# Patient Record
Sex: Male | Born: 1970
Health system: Southern US, Community
[De-identification: ages and names within clinical notes are randomized; demographics above are authoritative.]

## PROBLEM LIST (undated history)

## (undated) DIAGNOSIS — N289 Disorder of kidney and ureter, unspecified: Secondary | ICD-10-CM

## (undated) DIAGNOSIS — I509 Heart failure, unspecified: Secondary | ICD-10-CM

## (undated) DIAGNOSIS — I10 Essential (primary) hypertension: Secondary | ICD-10-CM

## (undated) DIAGNOSIS — E119 Type 2 diabetes mellitus without complications: Secondary | ICD-10-CM

## (undated) DIAGNOSIS — R569 Unspecified convulsions: Secondary | ICD-10-CM

## (undated) HISTORY — PX: OTHER SURGICAL HISTORY: SHX169

---

## 2006-09-03 ENCOUNTER — Emergency Department (HOSPITAL_COMMUNITY): Admission: EM | Admit: 2006-09-03 | Discharge: 2006-09-03 | Payer: Self-pay | Admitting: Emergency Medicine

## 2015-05-14 ENCOUNTER — Encounter (HOSPITAL_COMMUNITY): Payer: Self-pay | Admitting: Emergency Medicine

## 2015-05-14 ENCOUNTER — Emergency Department (HOSPITAL_COMMUNITY)
Admission: EM | Admit: 2015-05-14 | Discharge: 2015-05-14 | Disposition: A | Discharge status: Home / Self Care | Payer: No Typology Code available for payment source | Attending: Emergency Medicine | Admitting: Emergency Medicine

## 2015-05-14 DIAGNOSIS — E119 Type 2 diabetes mellitus without complications: Secondary | ICD-10-CM | POA: Diagnosis not present

## 2015-05-14 DIAGNOSIS — I10 Essential (primary) hypertension: Secondary | ICD-10-CM | POA: Diagnosis not present

## 2015-05-14 DIAGNOSIS — Y9389 Activity, other specified: Secondary | ICD-10-CM | POA: Insufficient documentation

## 2015-05-14 DIAGNOSIS — S8992XA Unspecified injury of left lower leg, initial encounter: Secondary | ICD-10-CM | POA: Insufficient documentation

## 2015-05-14 DIAGNOSIS — Y998 Other external cause status: Secondary | ICD-10-CM | POA: Insufficient documentation

## 2015-05-14 DIAGNOSIS — S199XXA Unspecified injury of neck, initial encounter: Secondary | ICD-10-CM | POA: Insufficient documentation

## 2015-05-14 DIAGNOSIS — S4992XA Unspecified injury of left shoulder and upper arm, initial encounter: Secondary | ICD-10-CM | POA: Insufficient documentation

## 2015-05-14 DIAGNOSIS — F17210 Nicotine dependence, cigarettes, uncomplicated: Secondary | ICD-10-CM | POA: Diagnosis not present

## 2015-05-14 DIAGNOSIS — Y92410 Unspecified street and highway as the place of occurrence of the external cause: Secondary | ICD-10-CM | POA: Diagnosis not present

## 2015-05-14 DIAGNOSIS — Z87448 Personal history of other diseases of urinary system: Secondary | ICD-10-CM | POA: Insufficient documentation

## 2015-05-14 DIAGNOSIS — M542 Cervicalgia: Secondary | ICD-10-CM

## 2015-05-14 HISTORY — DX: Disorder of kidney and ureter, unspecified: N28.9

## 2015-05-14 HISTORY — DX: Type 2 diabetes mellitus without complications: E11.9

## 2015-05-14 HISTORY — DX: Essential (primary) hypertension: I10

## 2015-05-14 MED ORDER — IBUPROFEN 800 MG PO TABS: 800.0000 mg | ORAL_TABLET | Freq: Three times a day (TID) | ORAL | Status: DC

## 2015-05-14 MED ORDER — CYCLOBENZAPRINE HCL 10 MG PO TABS: 10.0000 mg | ORAL_TABLET | Freq: Two times a day (BID) | ORAL | Status: DC | PRN

## 2015-05-14 MED ORDER — CYCLOBENZAPRINE HCL 10 MG PO TABS
10.0000 mg | ORAL_TABLET | Freq: Two times a day (BID) | ORAL | Status: DC | PRN
Start: 2015-05-14 — End: 2015-05-14

## 2015-05-14 NOTE — Discharge Instructions (Signed)
Please read attached information. If you experience any new or worsening signs or symptoms please return to the emergency room for evaluation. Please follow-up with your primary care provider or specialist as discussed. Please use medication prescribed only as directed and discontinue taking if you have any concerning signs or symptoms.   °
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
## 2015-06-15 ENCOUNTER — Encounter (HOSPITAL_COMMUNITY): Payer: Self-pay | Admitting: *Deleted

## 2015-06-15 ENCOUNTER — Emergency Department (HOSPITAL_COMMUNITY): Payer: Medicare Other

## 2015-06-15 ENCOUNTER — Inpatient Hospital Stay (HOSPITAL_COMMUNITY)
Admission: EM | Admit: 2015-06-15 | Discharge: 2015-06-27 | DRG: 673 | Disposition: A | Discharge status: Home / Self Care | Payer: Medicare Other | Attending: Internal Medicine | Admitting: Internal Medicine

## 2015-06-15 DIAGNOSIS — E785 Hyperlipidemia, unspecified: Secondary | ICD-10-CM | POA: Diagnosis present

## 2015-06-15 DIAGNOSIS — R29810 Facial weakness: Secondary | ICD-10-CM | POA: Diagnosis not present

## 2015-06-15 DIAGNOSIS — J9601 Acute respiratory failure with hypoxia: Secondary | ICD-10-CM | POA: Diagnosis present

## 2015-06-15 DIAGNOSIS — E118 Type 2 diabetes mellitus with unspecified complications: Secondary | ICD-10-CM | POA: Insufficient documentation

## 2015-06-15 DIAGNOSIS — E8770 Fluid overload, unspecified: Secondary | ICD-10-CM | POA: Diagnosis present

## 2015-06-15 DIAGNOSIS — R630 Anorexia: Secondary | ICD-10-CM | POA: Diagnosis present

## 2015-06-15 DIAGNOSIS — G9340 Encephalopathy, unspecified: Secondary | ICD-10-CM | POA: Diagnosis not present

## 2015-06-15 DIAGNOSIS — Z794 Long term (current) use of insulin: Secondary | ICD-10-CM

## 2015-06-15 DIAGNOSIS — R0789 Other chest pain: Secondary | ICD-10-CM

## 2015-06-15 DIAGNOSIS — E8729 Other acidosis: Secondary | ICD-10-CM

## 2015-06-15 DIAGNOSIS — D638 Anemia in other chronic diseases classified elsewhere: Secondary | ICD-10-CM | POA: Diagnosis present

## 2015-06-15 DIAGNOSIS — I120 Hypertensive chronic kidney disease with stage 5 chronic kidney disease or end stage renal disease: Secondary | ICD-10-CM | POA: Diagnosis not present

## 2015-06-15 DIAGNOSIS — Z9115 Patient's noncompliance with renal dialysis: Secondary | ICD-10-CM

## 2015-06-15 DIAGNOSIS — N2581 Secondary hyperparathyroidism of renal origin: Secondary | ICD-10-CM | POA: Diagnosis present

## 2015-06-15 DIAGNOSIS — I6340 Cerebral infarction due to embolism of unspecified cerebral artery: Secondary | ICD-10-CM | POA: Insufficient documentation

## 2015-06-15 DIAGNOSIS — Z95828 Presence of other vascular implants and grafts: Secondary | ICD-10-CM

## 2015-06-15 DIAGNOSIS — K921 Melena: Secondary | ICD-10-CM | POA: Insufficient documentation

## 2015-06-15 DIAGNOSIS — R079 Chest pain, unspecified: Secondary | ICD-10-CM

## 2015-06-15 DIAGNOSIS — T39315A Adverse effect of propionic acid derivatives, initial encounter: Secondary | ICD-10-CM | POA: Diagnosis present

## 2015-06-15 DIAGNOSIS — K2210 Ulcer of esophagus without bleeding: Secondary | ICD-10-CM | POA: Diagnosis present

## 2015-06-15 DIAGNOSIS — I639 Cerebral infarction, unspecified: Secondary | ICD-10-CM

## 2015-06-15 DIAGNOSIS — N186 End stage renal disease: Secondary | ICD-10-CM

## 2015-06-15 DIAGNOSIS — E872 Acidosis: Secondary | ICD-10-CM

## 2015-06-15 DIAGNOSIS — K2211 Ulcer of esophagus with bleeding: Secondary | ICD-10-CM | POA: Diagnosis present

## 2015-06-15 DIAGNOSIS — R0781 Pleurodynia: Secondary | ICD-10-CM | POA: Diagnosis present

## 2015-06-15 DIAGNOSIS — Z79899 Other long term (current) drug therapy: Secondary | ICD-10-CM

## 2015-06-15 DIAGNOSIS — R410 Disorientation, unspecified: Secondary | ICD-10-CM

## 2015-06-15 DIAGNOSIS — R471 Dysarthria and anarthria: Secondary | ICD-10-CM | POA: Diagnosis present

## 2015-06-15 DIAGNOSIS — Z419 Encounter for procedure for purposes other than remedying health state, unspecified: Secondary | ICD-10-CM

## 2015-06-15 DIAGNOSIS — I248 Other forms of acute ischemic heart disease: Secondary | ICD-10-CM | POA: Diagnosis present

## 2015-06-15 DIAGNOSIS — I63411 Cerebral infarction due to embolism of right middle cerebral artery: Secondary | ICD-10-CM

## 2015-06-15 DIAGNOSIS — I10 Essential (primary) hypertension: Secondary | ICD-10-CM | POA: Diagnosis present

## 2015-06-15 DIAGNOSIS — F1290 Cannabis use, unspecified, uncomplicated: Secondary | ICD-10-CM | POA: Diagnosis present

## 2015-06-15 DIAGNOSIS — D649 Anemia, unspecified: Secondary | ICD-10-CM | POA: Insufficient documentation

## 2015-06-15 DIAGNOSIS — E119 Type 2 diabetes mellitus without complications: Secondary | ICD-10-CM

## 2015-06-15 DIAGNOSIS — R52 Pain, unspecified: Secondary | ICD-10-CM

## 2015-06-15 DIAGNOSIS — N19 Unspecified kidney failure: Secondary | ICD-10-CM | POA: Diagnosis present

## 2015-06-15 DIAGNOSIS — Z8673 Personal history of transient ischemic attack (TIA), and cerebral infarction without residual deficits: Secondary | ICD-10-CM

## 2015-06-15 DIAGNOSIS — M79604 Pain in right leg: Secondary | ICD-10-CM

## 2015-06-15 DIAGNOSIS — K449 Diaphragmatic hernia without obstruction or gangrene: Secondary | ICD-10-CM | POA: Diagnosis present

## 2015-06-15 DIAGNOSIS — E1122 Type 2 diabetes mellitus with diabetic chronic kidney disease: Secondary | ICD-10-CM | POA: Diagnosis present

## 2015-06-15 DIAGNOSIS — F17210 Nicotine dependence, cigarettes, uncomplicated: Secondary | ICD-10-CM | POA: Diagnosis present

## 2015-06-15 DIAGNOSIS — I69354 Hemiplegia and hemiparesis following cerebral infarction affecting left non-dominant side: Secondary | ICD-10-CM | POA: Insufficient documentation

## 2015-06-15 DIAGNOSIS — D62 Acute posthemorrhagic anemia: Secondary | ICD-10-CM | POA: Diagnosis present

## 2015-06-15 DIAGNOSIS — R0902 Hypoxemia: Secondary | ICD-10-CM

## 2015-06-15 DIAGNOSIS — Y841 Kidney dialysis as the cause of abnormal reaction of the patient, or of later complication, without mention of misadventure at the time of the procedure: Secondary | ICD-10-CM | POA: Diagnosis present

## 2015-06-15 DIAGNOSIS — J449 Chronic obstructive pulmonary disease, unspecified: Secondary | ICD-10-CM | POA: Diagnosis present

## 2015-06-15 DIAGNOSIS — E11649 Type 2 diabetes mellitus with hypoglycemia without coma: Secondary | ICD-10-CM | POA: Diagnosis present

## 2015-06-15 DIAGNOSIS — E875 Hyperkalemia: Secondary | ICD-10-CM

## 2015-06-15 DIAGNOSIS — Z7984 Long term (current) use of oral hypoglycemic drugs: Secondary | ICD-10-CM

## 2015-06-15 DIAGNOSIS — G8194 Hemiplegia, unspecified affecting left nondominant side: Secondary | ICD-10-CM | POA: Diagnosis not present

## 2015-06-15 DIAGNOSIS — I4891 Unspecified atrial fibrillation: Secondary | ICD-10-CM | POA: Diagnosis present

## 2015-06-15 DIAGNOSIS — Z452 Encounter for adjustment and management of vascular access device: Secondary | ICD-10-CM

## 2015-06-15 DIAGNOSIS — K920 Hematemesis: Secondary | ICD-10-CM | POA: Diagnosis not present

## 2015-06-15 DIAGNOSIS — Z7982 Long term (current) use of aspirin: Secondary | ICD-10-CM

## 2015-06-15 DIAGNOSIS — Z720 Tobacco use: Secondary | ICD-10-CM | POA: Insufficient documentation

## 2015-06-15 DIAGNOSIS — I82619 Acute embolism and thrombosis of superficial veins of unspecified upper extremity: Secondary | ICD-10-CM | POA: Diagnosis present

## 2015-06-15 DIAGNOSIS — T85611A Breakdown (mechanical) of intraperitoneal dialysis catheter, initial encounter: Secondary | ICD-10-CM | POA: Diagnosis present

## 2015-06-15 DIAGNOSIS — Z992 Dependence on renal dialysis: Secondary | ICD-10-CM

## 2015-06-15 DIAGNOSIS — R0602 Shortness of breath: Secondary | ICD-10-CM

## 2015-06-15 DIAGNOSIS — J9600 Acute respiratory failure, unspecified whether with hypoxia or hypercapnia: Secondary | ICD-10-CM | POA: Diagnosis present

## 2015-06-15 LAB — BASIC METABOLIC PANEL
ANION GAP: 17 — AB (ref 5–15)
BUN: 149 mg/dL — AB (ref 6–20)
CALCIUM: 8.1 mg/dL — AB (ref 8.9–10.3)
CO2: 18 mmol/L — ABNORMAL LOW (ref 22–32)
Chloride: 105 mmol/L (ref 101–111)
Creatinine, Ser: 29.2 mg/dL — ABNORMAL HIGH (ref 0.61–1.24)
GFR calc Af Amer: 2 mL/min — ABNORMAL LOW (ref >60)
GFR, EST NON AFRICAN AMERICAN: 2 mL/min — AB (ref >60)
Glucose, Bld: 109 mg/dL — ABNORMAL HIGH (ref 65–99)
POTASSIUM: 5.5 mmol/L — AB (ref 3.5–5.1)
SODIUM: 140 mmol/L (ref 135–145)

## 2015-06-15 LAB — CBC
HCT: 22.9 % — ABNORMAL LOW (ref 39.0–52.0)
Hemoglobin: 7.8 g/dL — ABNORMAL LOW (ref 13.0–17.0)
MCH: 29.4 pg (ref 26.0–34.0)
MCHC: 34.1 g/dL (ref 30.0–36.0)
MCV: 86.4 fL (ref 78.0–100.0)
Platelets: 177 10*3/uL (ref 150–400)
RBC: 2.65 MIL/uL — ABNORMAL LOW (ref 4.22–5.81)
RDW: 15.6 % — ABNORMAL HIGH (ref 11.5–15.5)
WBC: 7.3 10*3/uL (ref 4.0–10.5)

## 2015-06-15 LAB — TROPONIN I: TROPONIN I: 0.08 ng/mL — AB (ref <0.031)

## 2015-06-15 MED ORDER — ENOXAPARIN SODIUM 100 MG/ML ~~LOC~~ SOLN
90.0000 mg | Freq: Once | SUBCUTANEOUS | Status: AC
  Administered 2015-06-16: 90 mg via SUBCUTANEOUS
  Filled 2015-06-15: qty 1

## 2015-06-15 MED ORDER — HYDROCODONE-ACETAMINOPHEN 5-325 MG PO TABS
2.0000 | ORAL_TABLET | Freq: Once | ORAL | Status: AC
  Administered 2015-06-16: 2 via ORAL
  Filled 2015-06-15: qty 2

## 2015-06-15 NOTE — ED Notes (Signed)
The pt is c/o rt sided chest pain for 3-4 days he has a cold  Cough non-productive.  He is also og bi-lateral feet and leg pain  Hx chf

## 2015-06-15 NOTE — ED Notes (Signed)
thye pt is a peritoneal dialysis pt

## 2015-06-15 NOTE — ED Provider Notes (Signed)
By signing my name below, I, George [name], attest that this documentation has been prepared under the direction and in the presence of [place], DO. Electronically Signed: Irene [name], ED Scribe. 06/15/2015. 11:46 PM.  TIME SEEN: 11:34 PM  CHIEF COMPLAINT: bilateral leg pain worse on the right side, right-sided chest pain, shortness of breath, cough  HPI:  HPI Comments: George [name] is a 44 y.o. male with a hx of HTN, CHF, DM, and in stage renal disease on nightly peritoneal dialysis who presents to the Emergency Department complaining of gradually worsening, sharp, "intense," shooting, right worse than left, bilateral posterior leg pain onset 3 days ago. He states that the pain will radiate up his legs from his feet and will keep him up at night. Pt currently gives himself peritoneal dialysis. He states that he takes it at night, which he has not done yet tonight. States he has been doing his peritoneal dialysis regularly. Pt reports associated nausea, "needle like" intermittent right sided chest pain, and non-productive cough. Pt has been taking 800 mg ibuprofen to no relief. You have shortness of breath but this is gone. He denies worsening or alleviating factors. Pt denies fever, chills, injury to the area, left sided chest pain, chest pressure, vomiting, diarrhea, abdominal pain, hx of neuropathy, or hx of blood clots. Pt states he is new to the area from [place] and does not have a PCP. Recently moved here in October. Does not have a nephrologist.   ROS: See HPI Constitutional: no fever  Eyes: no drainage  ENT: no runny nose   Cardiovascular: chest pain  Resp:  SOB  GI: no vomiting GU: no dysuria Integumentary: no rash  Allergy: no hives  Musculoskeletal: no leg swelling  Neurological: no slurred speech ROS otherwise negative  PAST MEDICAL HISTORY/PAST SURGICAL HISTORY:  Past Medical History  Diagnosis Date  . Diabetes mellitus without complication ([place])   .  Hypertension   . Renal disorder     MEDICATIONS:  Prior to Admission medications   Medication Sig Start Date End Date Taking? Authorizing Provider  amLODipine (NORVASC) 5 MG tablet Take 5 mg by mouth 2 (two) times daily.   Yes Historical Provider, MD  aspirin EC 81 MG tablet Take 81 mg by mouth daily.   Yes Historical Provider, MD  carvedilol (COREG) 25 MG tablet Take 25 mg by mouth 2 (two) times daily with a meal.   Yes Historical Provider, MD  digoxin (LANOXIN) 0.125 MG tablet Take 0.125 mg by mouth every other day.   Yes Historical Provider, MD  furosemide (LASIX) 80 MG tablet Take 160 mg by mouth 2 (two) times daily.   Yes Historical Provider, MD  ibuprofen (ADVIL,MOTRIN) 800 MG tablet Take 1 tablet (800 mg total) by mouth 3 (three) times daily. 05/14/15  Yes Jeffrey Hedges, PA-C  isosorbide-hydrALAZINE (BIDIL) 20-37.5 MG tablet Take 1 tablet by mouth 3 (three) times daily.   Yes Historical Provider, MD  lanthanum (FOSRENOL) 1000 MG chewable tablet Chew 1,000 mg by mouth 3 (three) times daily with meals.   Yes Historical Provider, MD  losartan (COZAAR) 25 MG tablet Take 25 mg by mouth daily.   Yes Historical Provider, MD  multivitamin (RENA-VIT) TABS tablet Take 1 tablet by mouth daily.   Yes Historical Provider, MD  pravastatin (PRAVACHOL) 20 MG tablet Take 20 mg by mouth every evening.   Yes Historical Provider, MD  sitaGLIPtin (JANUVIA) 25 MG tablet Take 25 mg by mouth daily.   Yes Historical  Provider, MD  cyclobenzaprine (FLEXERIL) 10 MG tablet Take 1 tablet (10 mg total) by mouth 2 (two) times daily as needed for muscle spasms. 05/14/15   Okey Regal, PA-C    ALLERGIES:  No Known Allergies  SOCIAL HISTORY:  Social History  Substance Use Topics  . Smoking status: Current Every Day Smoker    Types: Cigarettes  . Smokeless tobacco: Not on file  . Alcohol Use: No    FAMILY HISTORY: No family history on file.  EXAM: BP 168/117 mmHg  Pulse 89  Temp(Src) 97.6 F (36.4 C)  (Oral)  Resp 21  Ht 6' (1.829 m)  Wt 202 lb (91.627 kg)  BMI 27.39 kg/m2  SpO2 95% CONSTITUTIONAL: Alert and oriented and responds appropriately to questions. Well-appearing; well-nourished HEAD: Normocephalic EYES: Conjunctivae clear, PERRL ENT: normal nose; no rhinorrhea; moist mucous membranes; pharynx without lesions noted NECK: Supple, no meningismus, no LAD  CARD: RRR; S1 and S2 appreciated; no murmurs, no clicks, no rubs, no gallops RESP: Normal chest excursion without splinting or tachypnea; breath sounds equal bilaterally with mild scattered expiratory wheezing, no rhonchi or rales, no hypoxia or respiratory distress, speaking full sentences ABD/GI: Normal bowel sounds; non-distended; soft, non-tender, no rebound, no guarding, no peritoneal signs; bandage over the left abdomen for peritoneal dialysis BACK:  The back appears normal and is non-tender to palpation, there is no CVA tenderness EXT: Normal ROM in all joints; non-tender to palpation; no edema; normal capillary refill; no cyanosis, right lower extremity tenderness in the popliteal area and proximal posterior calf with palpation but no swelling, I am unable to reproduce pain with palpation of the left calf and there is no swelling in this light either SKIN: Normal color for age and race; warm NEURO: Moves all extremities equally, sensation to light touch intact diffusely, cranial nerves II through XII intact PSYCH: The patient's mood and manner are appropriate. Grooming and personal hygiene are appropriate.  MEDICAL DECISION MAKING: Patient here with complaints of chest pain and shortness of breath that have now resolved. Chest pain described as sharp and lasted for several seconds. Also complaining of bilateral lower extremity pain worse in the right leg and this is reproducible with palpation. No swelling but concern for possible DVT. Will give therapeutic Lovenox and schedule him for a venous ultrasound in the morning. Labs  ordered in triage show hyperkalemia, potassium 5.5 with no EKG changes. He does have a metabolic acidosis secondary to uremia. BUN is 149 and creatinine is 29. He reports compliance with his peritoneal dialysis. Hemoglobin is 7.8. Given he recently moved here from [place] we have no old labs for comparison. Will obtain records from recent admission in August 2016 from [facility] in [place]. We'll give Vicodin for pain. He does have a slightly positive troponin but this is in the setting of end-stage renal disease. We'll repeat a second troponin to ensure that there is no significant change. He is still chest pain-free.  ED PROGRESS:  1:48 AM- received records from [facility]; pt had a hemoglobin of 8.8 - 9.4 in August 2016   Patient's second troponin is negative. When I went to reassess him he was hypoxic with sats in the mid 80s. He does not wear oxygen at home. He denies having chest pain or shortness of breath. Repeat lung exam reveals similar mild scattered wheezes. Given 2 DuoNeb treatments without any relief of his hypoxia. When he and relates his sats dropped to 85%. He does not appear significantly volume overloaded.  Discussed with patient however given his leg pain and now his hypoxia concern for possible pulmonary embolus. He still makes urine several times a day and has been on peritoneal dialysis for 3 years. I feel he will need further imaging to rule out pulmonary embolus as a potential cause for his hypoxia as well as a venous Doppler. I do not want to give him contrast given he still has some renal function and makes urine but recommend VQ scan. Patient agrees on admission. We'll discuss with hospitalist.  3:23 AM- spoke with Hospitalist Dr. Arnoldo Morale who agrees with stepdown inpatient admission for the pt. Will put in orders for telemetry, inpatient. She requests that we discuss with nephrology to set up peritoneal dialysis.   3:45 AM  Spoke with Dr. Meredeth Ide with nephrology.  They will see the patient in consult and place orders for dialysis. Based on his labs it seems the patient is likely not compliant with his peritoneal dialysis.    EKG Interpretation  Date/Time:  Friday June 15 2015 19:58:48 EST Ventricular Rate:  88 PR Interval:  184 QRS Duration: 98 QT Interval:  374 QTC Calculation: 452 R Axis:   -78 Text Interpretation:  Normal sinus rhythm Left axis deviation Possible Anterior infarct , age undetermined Abnormal ECG No old tracing to compare Confirmed by Olaoluwa Grieder,  DO, Anissa Abbs [phone]) on 06/15/2015 11:38:49 PM         I personally performed the services described in this documentation, which was scribed in my presence. The recorded information has been reviewed and is accurate.         [place], DO 06/16/15 [phone]

## 2015-06-16 ENCOUNTER — Inpatient Hospital Stay (HOSPITAL_COMMUNITY): Payer: Medicare Other

## 2015-06-16 ENCOUNTER — Encounter (HOSPITAL_COMMUNITY): Payer: Self-pay

## 2015-06-16 DIAGNOSIS — R0602 Shortness of breath: Secondary | ICD-10-CM | POA: Diagnosis not present

## 2015-06-16 DIAGNOSIS — R471 Dysarthria and anarthria: Secondary | ICD-10-CM | POA: Diagnosis present

## 2015-06-16 DIAGNOSIS — R52 Pain, unspecified: Secondary | ICD-10-CM | POA: Diagnosis not present

## 2015-06-16 DIAGNOSIS — T85611A Breakdown (mechanical) of intraperitoneal dialysis catheter, initial encounter: Secondary | ICD-10-CM | POA: Diagnosis present

## 2015-06-16 DIAGNOSIS — R079 Chest pain, unspecified: Secondary | ICD-10-CM | POA: Insufficient documentation

## 2015-06-16 DIAGNOSIS — R0781 Pleurodynia: Secondary | ICD-10-CM

## 2015-06-16 DIAGNOSIS — D62 Acute posthemorrhagic anemia: Secondary | ICD-10-CM | POA: Diagnosis present

## 2015-06-16 DIAGNOSIS — Z8673 Personal history of transient ischemic attack (TIA), and cerebral infarction without residual deficits: Secondary | ICD-10-CM | POA: Diagnosis not present

## 2015-06-16 DIAGNOSIS — R410 Disorientation, unspecified: Secondary | ICD-10-CM | POA: Diagnosis not present

## 2015-06-16 DIAGNOSIS — E785 Hyperlipidemia, unspecified: Secondary | ICD-10-CM | POA: Diagnosis present

## 2015-06-16 DIAGNOSIS — R110 Nausea: Secondary | ICD-10-CM | POA: Diagnosis not present

## 2015-06-16 DIAGNOSIS — R0902 Hypoxemia: Secondary | ICD-10-CM | POA: Diagnosis present

## 2015-06-16 DIAGNOSIS — J9600 Acute respiratory failure, unspecified whether with hypoxia or hypercapnia: Secondary | ICD-10-CM | POA: Diagnosis present

## 2015-06-16 DIAGNOSIS — R29810 Facial weakness: Secondary | ICD-10-CM | POA: Diagnosis not present

## 2015-06-16 DIAGNOSIS — Z794 Long term (current) use of insulin: Secondary | ICD-10-CM | POA: Diagnosis not present

## 2015-06-16 DIAGNOSIS — E11649 Type 2 diabetes mellitus with hypoglycemia without coma: Secondary | ICD-10-CM | POA: Diagnosis present

## 2015-06-16 DIAGNOSIS — N19 Unspecified kidney failure: Secondary | ICD-10-CM | POA: Diagnosis not present

## 2015-06-16 DIAGNOSIS — R630 Anorexia: Secondary | ICD-10-CM | POA: Diagnosis present

## 2015-06-16 DIAGNOSIS — K2210 Ulcer of esophagus without bleeding: Secondary | ICD-10-CM | POA: Diagnosis present

## 2015-06-16 DIAGNOSIS — E8770 Fluid overload, unspecified: Secondary | ICD-10-CM | POA: Diagnosis present

## 2015-06-16 DIAGNOSIS — Z7982 Long term (current) use of aspirin: Secondary | ICD-10-CM | POA: Diagnosis not present

## 2015-06-16 DIAGNOSIS — T39315A Adverse effect of propionic acid derivatives, initial encounter: Secondary | ICD-10-CM | POA: Diagnosis present

## 2015-06-16 DIAGNOSIS — K2211 Ulcer of esophagus with bleeding: Secondary | ICD-10-CM | POA: Diagnosis present

## 2015-06-16 DIAGNOSIS — E872 Acidosis: Secondary | ICD-10-CM

## 2015-06-16 DIAGNOSIS — I63411 Cerebral infarction due to embolism of right middle cerebral artery: Secondary | ICD-10-CM | POA: Diagnosis not present

## 2015-06-16 DIAGNOSIS — N185 Chronic kidney disease, stage 5: Secondary | ICD-10-CM | POA: Diagnosis not present

## 2015-06-16 DIAGNOSIS — Z992 Dependence on renal dialysis: Secondary | ICD-10-CM

## 2015-06-16 DIAGNOSIS — I4891 Unspecified atrial fibrillation: Secondary | ICD-10-CM | POA: Diagnosis present

## 2015-06-16 DIAGNOSIS — Z9115 Patient's noncompliance with renal dialysis: Secondary | ICD-10-CM | POA: Diagnosis not present

## 2015-06-16 DIAGNOSIS — K920 Hematemesis: Secondary | ICD-10-CM | POA: Diagnosis not present

## 2015-06-16 DIAGNOSIS — N186 End stage renal disease: Secondary | ICD-10-CM

## 2015-06-16 DIAGNOSIS — F1290 Cannabis use, unspecified, uncomplicated: Secondary | ICD-10-CM | POA: Diagnosis present

## 2015-06-16 DIAGNOSIS — I63511 Cerebral infarction due to unspecified occlusion or stenosis of right middle cerebral artery: Secondary | ICD-10-CM | POA: Diagnosis not present

## 2015-06-16 DIAGNOSIS — E8729 Other acidosis: Secondary | ICD-10-CM | POA: Insufficient documentation

## 2015-06-16 DIAGNOSIS — Z79899 Other long term (current) drug therapy: Secondary | ICD-10-CM | POA: Diagnosis not present

## 2015-06-16 DIAGNOSIS — J9601 Acute respiratory failure with hypoxia: Secondary | ICD-10-CM

## 2015-06-16 DIAGNOSIS — Z7984 Long term (current) use of oral hypoglycemic drugs: Secondary | ICD-10-CM | POA: Diagnosis not present

## 2015-06-16 DIAGNOSIS — I120 Hypertensive chronic kidney disease with stage 5 chronic kidney disease or end stage renal disease: Secondary | ICD-10-CM | POA: Diagnosis present

## 2015-06-16 DIAGNOSIS — E119 Type 2 diabetes mellitus without complications: Secondary | ICD-10-CM | POA: Diagnosis not present

## 2015-06-16 DIAGNOSIS — K449 Diaphragmatic hernia without obstruction or gangrene: Secondary | ICD-10-CM | POA: Diagnosis present

## 2015-06-16 DIAGNOSIS — E875 Hyperkalemia: Secondary | ICD-10-CM | POA: Insufficient documentation

## 2015-06-16 DIAGNOSIS — N2581 Secondary hyperparathyroidism of renal origin: Secondary | ICD-10-CM | POA: Diagnosis present

## 2015-06-16 DIAGNOSIS — F17210 Nicotine dependence, cigarettes, uncomplicated: Secondary | ICD-10-CM | POA: Diagnosis present

## 2015-06-16 DIAGNOSIS — I63131 Cerebral infarction due to embolism of right carotid artery: Secondary | ICD-10-CM | POA: Diagnosis not present

## 2015-06-16 DIAGNOSIS — I639 Cerebral infarction, unspecified: Secondary | ICD-10-CM | POA: Diagnosis not present

## 2015-06-16 DIAGNOSIS — D638 Anemia in other chronic diseases classified elsewhere: Secondary | ICD-10-CM | POA: Diagnosis present

## 2015-06-16 DIAGNOSIS — I82619 Acute embolism and thrombosis of superficial veins of unspecified upper extremity: Secondary | ICD-10-CM | POA: Diagnosis present

## 2015-06-16 DIAGNOSIS — I248 Other forms of acute ischemic heart disease: Secondary | ICD-10-CM | POA: Diagnosis present

## 2015-06-16 DIAGNOSIS — I10 Essential (primary) hypertension: Secondary | ICD-10-CM | POA: Diagnosis present

## 2015-06-16 DIAGNOSIS — Y841 Kidney dialysis as the cause of abnormal reaction of the patient, or of later complication, without mention of misadventure at the time of the procedure: Secondary | ICD-10-CM | POA: Diagnosis present

## 2015-06-16 DIAGNOSIS — M79604 Pain in right leg: Secondary | ICD-10-CM | POA: Diagnosis present

## 2015-06-16 DIAGNOSIS — G8194 Hemiplegia, unspecified affecting left nondominant side: Secondary | ICD-10-CM | POA: Diagnosis not present

## 2015-06-16 DIAGNOSIS — G9340 Encephalopathy, unspecified: Secondary | ICD-10-CM | POA: Diagnosis not present

## 2015-06-16 DIAGNOSIS — K921 Melena: Secondary | ICD-10-CM | POA: Diagnosis not present

## 2015-06-16 DIAGNOSIS — J449 Chronic obstructive pulmonary disease, unspecified: Secondary | ICD-10-CM | POA: Diagnosis present

## 2015-06-16 DIAGNOSIS — E1122 Type 2 diabetes mellitus with diabetic chronic kidney disease: Secondary | ICD-10-CM | POA: Diagnosis present

## 2015-06-16 LAB — IRON AND TIBC
IRON: 145 ug/dL (ref 45–182)
SATURATION RATIOS: 72 % — AB (ref 17.9–39.5)
TIBC: 200 ug/dL — AB (ref 250–450)
UIBC: 55 ug/dL

## 2015-06-16 LAB — VITAMIN B12: Vitamin B-12: 798 pg/mL (ref 180–914)

## 2015-06-16 LAB — MRSA PCR SCREENING: MRSA by PCR: NEGATIVE

## 2015-06-16 LAB — BASIC METABOLIC PANEL
ANION GAP: 17 — AB (ref 5–15)
BUN: 151 mg/dL — ABNORMAL HIGH (ref 6–20)
CALCIUM: 7.7 mg/dL — AB (ref 8.9–10.3)
CHLORIDE: 105 mmol/L (ref 101–111)
CO2: 18 mmol/L — AB (ref 22–32)
CREATININE: 29.67 mg/dL — AB (ref 0.61–1.24)
GFR calc non Af Amer: 2 mL/min — ABNORMAL LOW (ref >60)
GFR, EST AFRICAN AMERICAN: 2 mL/min — AB (ref >60)
Glucose, Bld: 117 mg/dL — ABNORMAL HIGH (ref 65–99)
Potassium: 5.7 mmol/L — ABNORMAL HIGH (ref 3.5–5.1)
SODIUM: 140 mmol/L (ref 135–145)

## 2015-06-16 LAB — CBG MONITORING, ED
GLUCOSE-CAPILLARY: 115 mg/dL — AB (ref 65–99)
Glucose-Capillary: 66 mg/dL (ref 65–99)
Glucose-Capillary: 80 mg/dL (ref 65–99)

## 2015-06-16 LAB — CBC
HCT: 20.4 % — ABNORMAL LOW (ref 39.0–52.0)
HEMOGLOBIN: 7 g/dL — AB (ref 13.0–17.0)
MCH: 29.5 pg (ref 26.0–34.0)
MCHC: 34.3 g/dL (ref 30.0–36.0)
MCV: 86.1 fL (ref 78.0–100.0)
PLATELETS: 158 10*3/uL (ref 150–400)
RBC: 2.37 MIL/uL — AB (ref 4.22–5.81)
RDW: 15.5 % (ref 11.5–15.5)
WBC: 8.8 10*3/uL (ref 4.0–10.5)

## 2015-06-16 LAB — TROPONIN I
Troponin I: 0.07 ng/mL — ABNORMAL HIGH (ref <0.031)
Troponin I: 0.1 ng/mL — ABNORMAL HIGH
Troponin I: 0.07 ng/mL — ABNORMAL HIGH

## 2015-06-16 LAB — RETICULOCYTES
RBC.: 2.26 MIL/uL — ABNORMAL LOW (ref 4.22–5.81)
Retic Count, Absolute: 27.1 10*3/uL (ref 19.0–186.0)
Retic Ct Pct: 1.2 % (ref 0.4–3.1)

## 2015-06-16 LAB — FOLATE: FOLATE: 21 ng/mL (ref >5.9)

## 2015-06-16 LAB — FERRITIN: FERRITIN: 846 ng/mL — AB (ref 24–336)

## 2015-06-16 LAB — GLUCOSE, CAPILLARY
GLUCOSE-CAPILLARY: 157 mg/dL — AB (ref 65–99)
Glucose-Capillary: 98 mg/dL (ref 65–99)

## 2015-06-16 MED ORDER — FUROSEMIDE 80 MG PO TABS
160.0000 mg | ORAL_TABLET | Freq: Two times a day (BID) | ORAL | Status: DC
  Administered 2015-06-17: 160 mg via ORAL
  Filled 2015-06-16: qty 2

## 2015-06-16 MED ORDER — DELFLEX-LC/4.25% DEXTROSE 483 MOSM/L IP SOLN
INTRAPERITONEAL | Status: DC
  Administered 2015-06-16: 5000 mL via INTRAPERITONEAL
  Filled 2015-06-16 (×3): qty 3000

## 2015-06-16 MED ORDER — DIGOXIN 125 MCG PO TABS
0.1250 mg | ORAL_TABLET | ORAL | Status: DC
  Administered 2015-06-16: 0.125 mg via ORAL
  Filled 2015-06-16: qty 1

## 2015-06-16 MED ORDER — DIGOXIN 125 MCG PO TABS
0.1250 mg | ORAL_TABLET | ORAL | Status: DC
Start: 2015-06-17 — End: 2015-06-27
  Administered 2015-06-21 – 2015-06-25 (×3): 0.125 mg via ORAL
  Filled 2015-06-16 (×4): qty 1

## 2015-06-16 MED ORDER — INSULIN ASPART 100 UNIT/ML ~~LOC~~ SOLN
0.0000 [IU] | SUBCUTANEOUS | Status: DC
  Administered 2015-06-18 – 2015-06-20 (×3): 1 [IU] via SUBCUTANEOUS
  Administered 2015-06-23: 2 [IU] via SUBCUTANEOUS
  Administered 2015-06-24: 1 [IU] via SUBCUTANEOUS

## 2015-06-16 MED ORDER — HYDRALAZINE HCL 20 MG/ML IJ SOLN: 5.0000 mg | Freq: Four times a day (QID) | INTRAMUSCULAR | Status: DC | PRN

## 2015-06-16 MED ORDER — SODIUM CHLORIDE 0.9 % IV SOLN
250.0000 mL | INTRAVENOUS | Status: DC | PRN
  Administered 2015-06-19: 11:00:00 via INTRAVENOUS

## 2015-06-16 MED ORDER — INSULIN ASPART 100 UNIT/ML ~~LOC~~ SOLN: 0.0000 [IU] | Freq: Three times a day (TID) | SUBCUTANEOUS | Status: DC

## 2015-06-16 MED ORDER — ISOSORB DINITRATE-HYDRALAZINE 20-37.5 MG PO TABS
1.0000 | ORAL_TABLET | Freq: Three times a day (TID) | ORAL | Status: DC
  Filled 2015-06-16 (×3): qty 1

## 2015-06-16 MED ORDER — IPRATROPIUM-ALBUTEROL 0.5-2.5 (3) MG/3ML IN SOLN
3.0000 mL | Freq: Once | RESPIRATORY_TRACT | Status: AC
  Administered 2015-06-16: 3 mL via RESPIRATORY_TRACT
  Filled 2015-06-16: qty 3

## 2015-06-16 MED ORDER — ONDANSETRON HCL 4 MG PO TABS
4.0000 mg | ORAL_TABLET | Freq: Four times a day (QID) | ORAL | Status: DC | PRN
Start: 2015-06-16 — End: 2015-06-27

## 2015-06-16 MED ORDER — TECHNETIUM TC 99M DIETHYLENETRIAME-PENTAACETIC ACID: 30.0000 | Freq: Once | INTRAVENOUS | Status: DC | PRN

## 2015-06-16 MED ORDER — CARVEDILOL 25 MG PO TABS
25.0000 mg | ORAL_TABLET | Freq: Two times a day (BID) | ORAL | Status: DC
  Administered 2015-06-16 – 2015-06-17 (×3): 25 mg via ORAL
  Filled 2015-06-16 (×5): qty 1

## 2015-06-16 MED ORDER — DELFLEX-LC/4.25% DEXTROSE 483 MOSM/L IP SOLN: INTRAPERITONEAL | Status: DC

## 2015-06-16 MED ORDER — RENA-VITE PO TABS
1.0000 | ORAL_TABLET | Freq: Every day | ORAL | Status: DC
  Administered 2015-06-16 – 2015-06-24 (×8): 1 via ORAL
  Filled 2015-06-16 (×9): qty 1

## 2015-06-16 MED ORDER — DELFLEX-LC/2.5% DEXTROSE 394 MOSM/L IP SOLN
INTRAPERITONEAL | Status: DC
  Administered 2015-06-16: 5000 mL via INTRAPERITONEAL
  Filled 2015-06-16 (×3): qty 3000

## 2015-06-16 MED ORDER — TECHNETIUM TO 99M ALBUMIN AGGREGATED
4.1000 | Freq: Once | INTRAVENOUS | Status: AC | PRN
  Administered 2015-06-16: 4 via INTRAVENOUS

## 2015-06-16 MED ORDER — ISOSORB DINITRATE-HYDRALAZINE 20-37.5 MG PO TABS
1.0000 | ORAL_TABLET | Freq: Three times a day (TID) | ORAL | Status: DC
  Administered 2015-06-16 – 2015-06-26 (×26): 1 via ORAL
  Filled 2015-06-16 (×27): qty 1

## 2015-06-16 MED ORDER — ACETAMINOPHEN 325 MG PO TABS
650.0000 mg | ORAL_TABLET | Freq: Four times a day (QID) | ORAL | Status: DC | PRN
  Filled 2015-06-16: qty 2

## 2015-06-16 MED ORDER — SODIUM CHLORIDE 0.9 % IJ SOLN
3.0000 mL | Freq: Two times a day (BID) | INTRAMUSCULAR | Status: DC
  Administered 2015-06-16 – 2015-06-22 (×12): 3 mL via INTRAVENOUS

## 2015-06-16 MED ORDER — DELFLEX-LC/2.5% DEXTROSE 394 MOSM/L IP SOLN: INTRAPERITONEAL | Status: DC

## 2015-06-16 MED ORDER — HYDROMORPHONE HCL 1 MG/ML IJ SOLN: 0.5000 mg | INTRAMUSCULAR | Status: DC | PRN

## 2015-06-16 MED ORDER — LOSARTAN POTASSIUM 50 MG PO TABS
25.0000 mg | ORAL_TABLET | Freq: Every day | ORAL | Status: DC
  Administered 2015-06-16 – 2015-06-17 (×2): 25 mg via ORAL
  Filled 2015-06-16 (×2): qty 1

## 2015-06-16 MED ORDER — HEPARIN 1000 UNIT/ML FOR PERITONEAL DIALYSIS
2500.0000 [IU] | INTRAMUSCULAR | Status: DC | PRN
  Filled 2015-06-16: qty 2.5

## 2015-06-16 MED ORDER — FUROSEMIDE 20 MG PO TABS
160.0000 mg | ORAL_TABLET | Freq: Two times a day (BID) | ORAL | Status: DC
  Filled 2015-06-16: qty 8

## 2015-06-16 MED ORDER — GENTAMICIN SULFATE 0.1 % EX CREA
1.0000 "application " | TOPICAL_CREAM | Freq: Every day | CUTANEOUS | Status: DC
  Administered 2015-06-19 – 2015-06-26 (×7): 1 via TOPICAL
  Filled 2015-06-16 (×2): qty 15

## 2015-06-16 MED ORDER — ONDANSETRON HCL 4 MG/2ML IJ SOLN
4.0000 mg | Freq: Four times a day (QID) | INTRAMUSCULAR | Status: DC | PRN
  Administered 2015-06-16 – 2015-06-18 (×3): 4 mg via INTRAVENOUS
  Filled 2015-06-16 (×4): qty 2

## 2015-06-16 MED ORDER — SODIUM CHLORIDE 0.9 % IJ SOLN: 3.0000 mL | INTRAMUSCULAR | Status: DC | PRN

## 2015-06-16 MED ORDER — ALUM & MAG HYDROXIDE-SIMETH 200-200-20 MG/5ML PO SUSP: 30.0000 mL | Freq: Four times a day (QID) | ORAL | Status: DC | PRN

## 2015-06-16 MED ORDER — LANTHANUM CARBONATE 500 MG PO CHEW
1000.0000 mg | CHEWABLE_TABLET | Freq: Three times a day (TID) | ORAL | Status: DC
  Administered 2015-06-17 – 2015-06-27 (×17): 1000 mg via ORAL
  Filled 2015-06-16 (×18): qty 2

## 2015-06-16 MED ORDER — LOSARTAN POTASSIUM 25 MG PO TABS
25.0000 mg | ORAL_TABLET | Freq: Every day | ORAL | Status: DC
  Filled 2015-06-16: qty 1

## 2015-06-16 MED ORDER — ASPIRIN EC 81 MG PO TBEC
81.0000 mg | DELAYED_RELEASE_TABLET | Freq: Every day | ORAL | Status: DC
  Administered 2015-06-16: 81 mg via ORAL
  Filled 2015-06-16: qty 1

## 2015-06-16 MED ORDER — LANTHANUM CARBONATE 500 MG PO CHEW
1000.0000 mg | CHEWABLE_TABLET | Freq: Three times a day (TID) | ORAL | Status: DC
  Administered 2015-06-16: 1000 mg via ORAL
  Filled 2015-06-16 (×4): qty 2

## 2015-06-16 MED ORDER — HEPARIN SODIUM (PORCINE) 5000 UNIT/ML IJ SOLN
5000.0000 [IU] | Freq: Three times a day (TID) | INTRAMUSCULAR | Status: DC
  Administered 2015-06-16: 5000 [IU] via SUBCUTANEOUS
  Filled 2015-06-16 (×2): qty 1

## 2015-06-16 MED ORDER — AMLODIPINE BESYLATE 5 MG PO TABS
5.0000 mg | ORAL_TABLET | Freq: Two times a day (BID) | ORAL | Status: DC
  Administered 2015-06-16 – 2015-06-17 (×3): 5 mg via ORAL
  Filled 2015-06-16 (×3): qty 1

## 2015-06-16 MED ORDER — PRAVASTATIN SODIUM 20 MG PO TABS
20.0000 mg | ORAL_TABLET | Freq: Every evening | ORAL | Status: DC
  Administered 2015-06-17 – 2015-06-26 (×7): 20 mg via ORAL
  Filled 2015-06-16 (×7): qty 1

## 2015-06-16 MED ORDER — ACETAMINOPHEN 650 MG RE SUPP: 650.0000 mg | Freq: Four times a day (QID) | RECTAL | Status: DC | PRN

## 2015-06-16 MED ORDER — OXYCODONE HCL 5 MG PO TABS
5.0000 mg | ORAL_TABLET | ORAL | Status: DC | PRN
  Administered 2015-06-16 – 2015-06-17 (×3): 5 mg via ORAL
  Filled 2015-06-16 (×3): qty 1

## 2015-06-16 NOTE — ED Notes (Signed)
checked patient blood sugar it was 80 notified RN of cbg

## 2015-06-16 NOTE — ED Notes (Signed)
Pt ambulated with pulse ox. Pt sats 85% RA, denies any SOB, or fatigue. States "I feel fine." MD made aware.

## 2015-06-16 NOTE — Consult Note (Signed)
Renal Service Consult Note [facility]  [place]att 06/16/2015 Roney Jaffe D Requesting Physician:  Dr Arnoldo Morale  Reason for Consult:  ESRD pt with uremia HPI: The patient is a 44 y.o. year-old with hx of HTN, and DM, esrd on PD for about 1 year per patient. He moved here from [place] in October, f/b Dr Deterding at [facility].  He presents with 2-3 month anorexia, fatigue.  Now having nausea as well. Also coughing, no SOB or orthpnea, no leg swelling.  Labs show creat 2.9 and BUN 150.  Michela Pitcher he is supposed to be doing extra exchange ("pause") in the evening but doesn't do it because he is concerned that it will decrease PD efficiency according to some things his doctors told him before he left [place] about his PD.    ROS  no fevers  no diarreha  +nausea  no joint pain  no HA  Past Medical History  Past Medical History  Diagnosis Date  . Diabetes mellitus without complication ([place])   . Hypertension   . Renal disorder    Past Surgical History History reviewed. No pertinent past surgical history. Family History No family history on file. Social History  reports that he has been smoking Cigarettes.  He does not have any smokeless tobacco history on file. He reports that he uses illicit drugs (Marijuana). He reports that he does not drink alcohol. Allergies No Known Allergies Home medications Prior to Admission medications   Medication Sig Start Date End Date Taking? Authorizing Provider  amLODipine (NORVASC) 5 MG tablet Take 5 mg by mouth 2 (two) times daily.   Yes Historical Provider, MD  aspirin EC 81 MG tablet Take 81 mg by mouth daily.   Yes Historical Provider, MD  carvedilol (COREG) 25 MG tablet Take 25 mg by mouth 2 (two) times daily with a meal.   Yes Historical Provider, MD  digoxin (LANOXIN) 0.125 MG tablet Take 0.125 mg by mouth every other day.   Yes Historical Provider, MD  furosemide (LASIX) 80 MG tablet Take 160 mg by mouth 2 (two) times daily.   Yes  Historical Provider, MD  ibuprofen (ADVIL,MOTRIN) 800 MG tablet Take 1 tablet (800 mg total) by mouth 3 (three) times daily. 05/14/15  Yes Jeffrey Hedges, PA-C  isosorbide-hydrALAZINE (BIDIL) 20-37.5 MG tablet Take 1 tablet by mouth 3 (three) times daily.   Yes Historical Provider, MD  lanthanum (FOSRENOL) 1000 MG chewable tablet Chew 1,000 mg by mouth 3 (three) times daily with meals.   Yes Historical Provider, MD  losartan (COZAAR) 25 MG tablet Take 25 mg by mouth daily.   Yes Historical Provider, MD  multivitamin (RENA-VIT) TABS tablet Take 1 tablet by mouth daily.   Yes Historical Provider, MD  pravastatin (PRAVACHOL) 20 MG tablet Take 20 mg by mouth every evening.   Yes Historical Provider, MD  sitaGLIPtin (JANUVIA) 25 MG tablet Take 25 mg by mouth daily.   Yes Historical Provider, MD  cyclobenzaprine (FLEXERIL) 10 MG tablet Take 1 tablet (10 mg total) by mouth 2 (two) times daily as needed for muscle spasms. 05/14/15   Okey Regal, PA-C   Liver Function Tests No results for input(s): AST, ALT, ALKPHOS, BILITOT, PROT, ALBUMIN in the last 168 hours. No results for input(s): LIPASE, AMYLASE in the last 168 hours. CBC  Recent Labs Lab 06/15/15 2021 06/16/15 0634  WBC 7.3 8.8  HGB 7.8* 7.0*  HCT 22.9* 20.4*  MCV 86.4 86.1  PLT 177 0000000   Basic Metabolic Panel  Recent Labs Lab 06/15/15 2021 06/16/15 0634  NA 140 140  K 5.5* 5.7*  CL 105 105  CO2 18* 18*  GLUCOSE 109* 117*  BUN 149* 151*  CREATININE 29.20* 29.67*  CALCIUM 8.1* 7.7*    Filed Vitals:   06/16/15 1224 06/16/15 1300 06/16/15 1330 06/16/15 1509  BP: 166/97 189/105 174/98   Pulse: 82 90 89 84  Temp:    97.8 F (36.6 C)  TempSrc:    Oral  Resp: 20     Height:    6' (1.829 m)  Weight:    94.8 kg (208 lb 15.9 oz)  SpO2: 94% 91% 90% 93%   Exam Alert, coughing No rash, cyanosis or gangrene Sclera anicteric, throat clear No jvd Chest occ basilar rales bilat, no bronchial BS Cor no MRG Abd soft ntnd no  ascites or HSM +bs GU normal male Ext trace pretib edema bilat Neuro is alert nf Ox 3   CCPD regimen > 6 exchanges/ 24 hour, 5 dwells overnight of 3L and daybag of 2L . Says he is supposed to be doing a "pause" also but does not do it.   CXR fluid in fissure, R effusion  Assessment: 1 Uremia/ underdialysis - noncompliance most likely, not doing the pause, also possible loss of RRF during this past year. Will plan CCPD w extra exchanges over w/e and anticipate probably having to change to hemodialysis ultimately.  2 ESRD on PD x 1 year 3 HTN 3 bp meds 4 DM on insulin 5 Anemia Hb 7.0 6 Vol - looks vol up I believe, use 2/3 2.5% and 1/3 4.25%    Plan - as above  Kelly Splinter MD [facility] pager [phone]    cell [phone] 06/16/2015, 4:08 PM  '

## 2015-06-16 NOTE — ED Notes (Signed)
PT vomited multiple times -- approx 347mL emesis

## 2015-06-16 NOTE — Progress Notes (Signed)
I have seen and assessed patient and agree with Dr Adline Mango assessment and plan. Patient presenting with right sided CP and RLE pain with some SOB.Nephrology consulted for dialysis.

## 2015-06-16 NOTE — ED Notes (Signed)
Gave pt graham crackers, a sandwich, and ginger ale to support BGL.

## 2015-06-16 NOTE — ED Notes (Signed)
Pt requests not to take any more medication until he eats something -- states is no longer nauseous.

## 2015-06-16 NOTE — ED Notes (Signed)
MD at bedside. 

## 2015-06-16 NOTE — ED Notes (Signed)
Medications requested from pharmacy.

## 2015-06-16 NOTE — H&P (Signed)
[facility] Admission History and Physical       [name] [id number] DOB: Aug 08, 1970 DOA: 06/15/2015  Referring physician: EDP PCP: No primary care provider on file.  Specialists:   Chief Complaint: Right Sided Chest pain and SOB.     HPI: George [name] is a 44 y.o. male with a history of ESRD on Peritoneal dialysis, DM2, and HTN who presents to the ED with complaints of right sided chest pain and SOB.  He was found to have hypoxemia in the ED to 85%.   He was placed on NCO2 at  2 liters.   He was referred for further workup.     Review of Systems:  Constitutional: No Weight Loss, No Weight Gain, Night Sweats, Fevers, Chills, Dizziness, Light Headedness, Fatigue, or Generalized Weakness HEENT: No Headaches, Difficulty Swallowing,Tooth/Dental Problems,Sore Throat,  No Sneezing, Rhinitis, Ear Ache, Nasal Congestion, or Post Nasal Drip,  Cardio-vascular:  +Chest pain, Orthopnea, PND, Edema in Lower Extremities, Anasarca, Dizziness, Palpitations  Resp:    +Dyspnea, No DOE, No Productive Cough, No Non-Productive Cough, No Hemoptysis, No Wheezing.    GI: No Heartburn, Indigestion, Abdominal Pain, Nausea, Vomiting, Diarrhea, Constipation, Hematemesis, Hematochezia, Melena, Change in Bowel Habits,  Loss of Appetite  GU: No Dysuria, No Change in Color of Urine, No Urgency or Urinary Frequency, No Flank pain.  Musculoskeletal: No Joint Pain or Swelling, No Decreased Range of Motion, No Back Pain.  Neurologic: No Syncope, No Seizures, Muscle Weakness, Paresthesia, Vision Disturbance or Loss, No Diplopia, No Vertigo, No Difficulty Walking,  Skin: No Rash or Lesions. Psych: No Change in Mood or Affect, No Depression or Anxiety, No Memory loss, No Confusion, or Hallucinations   Past Medical History  Diagnosis Date  . Diabetes mellitus without complication ([place])   . Hypertension   . Renal disorder      History reviewed. No pertinent past surgical history.    Prior to Admission  medications   Medication Sig Start Date End Date Taking? Authorizing Provider  amLODipine (NORVASC) 5 MG tablet Take 5 mg by mouth 2 (two) times daily.   Yes Historical Provider, MD  aspirin EC 81 MG tablet Take 81 mg by mouth daily.   Yes Historical Provider, MD  carvedilol (COREG) 25 MG tablet Take 25 mg by mouth 2 (two) times daily with a meal.   Yes Historical Provider, MD  digoxin (LANOXIN) 0.125 MG tablet Take 0.125 mg by mouth every other day.   Yes Historical Provider, MD  furosemide (LASIX) 80 MG tablet Take 160 mg by mouth 2 (two) times daily.   Yes Historical Provider, MD  ibuprofen (ADVIL,MOTRIN) 800 MG tablet Take 1 tablet (800 mg total) by mouth 3 (three) times daily. 05/14/15  Yes Jeffrey Hedges, PA-C  isosorbide-hydrALAZINE (BIDIL) 20-37.5 MG tablet Take 1 tablet by mouth 3 (three) times daily.   Yes Historical Provider, MD  lanthanum (FOSRENOL) 1000 MG chewable tablet Chew 1,000 mg by mouth 3 (three) times daily with meals.   Yes Historical Provider, MD  losartan (COZAAR) 25 MG tablet Take 25 mg by mouth daily.   Yes Historical Provider, MD  multivitamin (RENA-VIT) TABS tablet Take 1 tablet by mouth daily.   Yes Historical Provider, MD  pravastatin (PRAVACHOL) 20 MG tablet Take 20 mg by mouth every evening.   Yes Historical Provider, MD  sitaGLIPtin (JANUVIA) 25 MG tablet Take 25 mg by mouth daily.   Yes Historical Provider, MD  cyclobenzaprine (FLEXERIL) 10 MG tablet Take 1 tablet (10 mg total)  by mouth 2 (two) times daily as needed for muscle spasms. 05/14/15   Okey Regal, PA-C     No Known Allergies    Social History:  reports that he has been smoking Cigarettes.  He does not have any smokeless tobacco history on file. He reports that he uses illicit drugs (Marijuana). He reports that he does not drink alcohol.     No family history on file.     Physical Exam:  GEN:  Pleasant  44 y.o. male examined and in no acute distress; cooperative with exam Filed Vitals:    06/16/15 0213 06/16/15 0214 06/16/15 0228 06/16/15 0300  BP:   191/101 184/100  Pulse: 78 82 84 84  Temp:      TempSrc:      Resp: 14 22 22 23   Height:      Weight:      SpO2: 97% 97% 90% 92%   Blood pressure 184/100, pulse 84, temperature 97.6 F (36.4 C), temperature source Oral, resp. rate 23, height 6' (1.829 m), weight 91.627 kg (202 lb), SpO2 92 %. PSYCH: He is alert and oriented x4; does not appear anxious does not appear depressed; affect is normal HEENT: Normocephalic and Atraumatic, Mucous membranes pink; PERRLA; EOM intact; Fundi:  Benign;  No scleral icterus, Nares: Patent, Oropharynx: Clear, Fair Dentition,    Neck:  FROM, No Cervical Lymphadenopathy nor Thyromegaly or Carotid Bruit; No JVD; Breasts:: Not examined CHEST WALL: No tenderness CHEST: Normal respiration, clear to auscultation bilaterally HEART: Regular rate and rhythm; no murmurs rubs or gallops BACK: No kyphosis or scoliosis; No CVA tenderness ABDOMEN: Positive Bowel Sounds, Soft Non-Tender, No Rebound or Guarding; No Masses, No Organomegaly Rectal Exam: Not done EXTREMITIES: No Cyanosis, Clubbing, or Edema; No Ulcerations. Genitalia: not examined PULSES: 2+ and symmetric SKIN: Normal hydration no rash or ulceration CNS:  Alert and Oriented x 4, No Focal Deficits Vascular: pulses palpable throughout    Labs on Admission:  Basic Metabolic Panel:  Recent Labs Lab 06/15/15 2021  NA 140  K 5.5*  CL 105  CO2 18*  GLUCOSE 109*  BUN 149*  CREATININE 29.20*  CALCIUM 8.1*   Liver Function Tests: No results for input(s): AST, ALT, ALKPHOS, BILITOT, PROT, ALBUMIN in the last 168 hours. No results for input(s): LIPASE, AMYLASE in the last 168 hours. No results for input(s): AMMONIA in the last 168 hours. CBC:  Recent Labs Lab 06/15/15 2021  WBC 7.3  HGB 7.8*  HCT 22.9*  MCV 86.4  PLT 177   Cardiac Enzymes:  Recent Labs Lab 06/15/15 2021 06/16/15 0007  TROPONINI 0.08* 0.07*    BNP  (last 3 results) No results for input(s): BNP in the last 8760 hours.  ProBNP (last 3 results) No results for input(s): PROBNP in the last 8760 hours.  CBG: No results for input(s): GLUCAP in the last 168 hours.  Radiological Exams on Admission: Dg Chest 2 View  06/15/2015  CLINICAL DATA:  Right-sided chest pain.  Cough for 3 days. EXAM: CHEST  2 VIEW COMPARISON:  None FINDINGS: Heart size is normal. Small right pleural effusion noted. No left effusion identified. There is pleural and parenchymal scarring within the right midlung and right base noted. Left lung appears clear. IMPRESSION: 1. Chronic appearing pleural and parenchymal scarring involving the right midlung and right base. The over 2 suspect small right effusion. Electronically Signed   By: Kerby Moors M.D.   On: 06/15/2015 20:45     EKG: Independently reviewed.  Normal  Sinus Rhyhm Rate = 88 No Acute S-T changes        Assessment/Plan:      44 y.o. male with  Active Problems:      1.    Acute respiratory failure (HCC)/Hypoxia   Cardiac monitoring   BiPAP/O2 PRN   V/Q scan in AM     2.   Chest Pain- Pleuritic   V/Q scan in AM   Cycle Troponins      2.    ESRD on peritoneal dialysis [facility])   PD per Nephrology      3.    Diabetes mellitus without complication (HCC)   Hold Januvia, and Glimepiride Rx   SSI coverage PRN   Check HbA1C     4.    Essential hypertension   Continue Amlodipine, Carvedilol, Losartan,  Imdur/Hydralazine and Lasix Rx   Monitor BPs     5.    Hyperkalemia   Correction with PD     6.    DVT Prophylaxis   SQ Heparin     Code Status:     FULL CODE      Family Communication:   Family at Bedside   Disposition Plan:    Inpatient Status        Time spent: [facility] Pager [phone]   If [place] Please Contact the Day Rounding Team MD for [facility]  If 7PM-7AM, Please Contact Night-Floor Coverage   [email or web address] Password [email or web address] 06/16/2015, 3:23 AM     ADDENDUM:   Patient was seen and examined on 06/16/2015

## 2015-06-16 NOTE — Progress Notes (Signed)
Pt arrived from ED per stretcher. Oriented to room

## 2015-06-16 NOTE — ED Notes (Signed)
Attempted report 

## 2015-06-16 NOTE — ED Notes (Signed)
Contacted pharmacy regarding delay in medication delivery

## 2015-06-16 NOTE — ED Notes (Signed)
Received call from [place], pt ready for transport to unit by this RN

## 2015-06-16 NOTE — ED Notes (Signed)
Breakfast delivered to bedside.

## 2015-06-17 ENCOUNTER — Encounter (HOSPITAL_COMMUNITY): Payer: Self-pay

## 2015-06-17 DIAGNOSIS — K920 Hematemesis: Secondary | ICD-10-CM | POA: Diagnosis not present

## 2015-06-17 DIAGNOSIS — N19 Unspecified kidney failure: Secondary | ICD-10-CM | POA: Diagnosis present

## 2015-06-17 LAB — CBC
HEMATOCRIT: 19.9 % — AB (ref 39.0–52.0)
HEMOGLOBIN: 6.7 g/dL — AB (ref 13.0–17.0)
MCH: 29.1 pg (ref 26.0–34.0)
MCHC: 33.7 g/dL (ref 30.0–36.0)
MCV: 86.5 fL (ref 78.0–100.0)
Platelets: 164 10*3/uL (ref 150–400)
RBC: 2.3 MIL/uL — ABNORMAL LOW (ref 4.22–5.81)
RDW: 15.5 % (ref 11.5–15.5)
WBC: 8 10*3/uL (ref 4.0–10.5)

## 2015-06-17 LAB — PREPARE RBC (CROSSMATCH)

## 2015-06-17 LAB — RENAL FUNCTION PANEL
ALBUMIN: 2.5 g/dL — AB (ref 3.5–5.0)
Anion gap: 16 — ABNORMAL HIGH (ref 5–15)
BUN: 149 mg/dL — AB (ref 6–20)
CHLORIDE: 103 mmol/L (ref 101–111)
CO2: 21 mmol/L — ABNORMAL LOW (ref 22–32)
CREATININE: 26.64 mg/dL — AB (ref 0.61–1.24)
Calcium: 7.6 mg/dL — ABNORMAL LOW (ref 8.9–10.3)
GFR calc Af Amer: 2 mL/min — ABNORMAL LOW (ref >60)
GFR, EST NON AFRICAN AMERICAN: 2 mL/min — AB (ref >60)
GLUCOSE: 111 mg/dL — AB (ref 65–99)
POTASSIUM: 5.1 mmol/L (ref 3.5–5.1)
Phosphorus: 8.8 mg/dL — ABNORMAL HIGH (ref 2.5–4.6)
Sodium: 140 mmol/L (ref 135–145)

## 2015-06-17 LAB — GLUCOSE, CAPILLARY
GLUCOSE-CAPILLARY: 129 mg/dL — AB (ref 65–99)
GLUCOSE-CAPILLARY: 153 mg/dL — AB (ref 65–99)
GLUCOSE-CAPILLARY: 114 mg/dL — AB (ref 65–99)
Glucose-Capillary: 144 mg/dL — ABNORMAL HIGH (ref 65–99)

## 2015-06-17 LAB — TROPONIN I
TROPONIN I: 0.08 ng/mL — AB (ref <0.031)
Troponin I: 0.08 ng/mL — ABNORMAL HIGH (ref <0.031)

## 2015-06-17 LAB — ABO/RH: ABO/RH(D): O POS

## 2015-06-17 MED ORDER — HYDRALAZINE HCL 20 MG/ML IJ SOLN
10.0000 mg | Freq: Four times a day (QID) | INTRAMUSCULAR | Status: DC | PRN
  Administered 2015-06-17 (×2): 10 mg via INTRAVENOUS
  Filled 2015-06-17 (×3): qty 1

## 2015-06-17 MED ORDER — ONDANSETRON HCL 4 MG/2ML IJ SOLN: 4.0000 mg | Freq: Four times a day (QID) | INTRAMUSCULAR | Status: DC | PRN

## 2015-06-17 MED ORDER — ACETAMINOPHEN 325 MG PO TABS
650.0000 mg | ORAL_TABLET | Freq: Four times a day (QID) | ORAL | Status: DC | PRN
  Administered 2015-06-21: 650 mg via ORAL

## 2015-06-17 MED ORDER — SORBITOL 70 % SOLN
30.0000 mL | Status: DC | PRN
  Filled 2015-06-17: qty 30

## 2015-06-17 MED ORDER — CAMPHOR-MENTHOL 0.5-0.5 % EX LOTN
1.0000 "application " | TOPICAL_LOTION | Freq: Three times a day (TID) | CUTANEOUS | Status: DC | PRN
  Filled 2015-06-17: qty 222

## 2015-06-17 MED ORDER — SODIUM CHLORIDE 0.9 % IV SOLN: Freq: Once | INTRAVENOUS | Status: DC

## 2015-06-17 MED ORDER — PANTOPRAZOLE SODIUM 40 MG IV SOLR
40.0000 mg | Freq: Two times a day (BID) | INTRAVENOUS | Status: DC
  Administered 2015-06-20 – 2015-06-22 (×5): 40 mg via INTRAVENOUS
  Filled 2015-06-17 (×5): qty 40

## 2015-06-17 MED ORDER — ACETAMINOPHEN 325 MG PO TABS
650.0000 mg | ORAL_TABLET | Freq: Once | ORAL | Status: DC
  Filled 2015-06-17: qty 2

## 2015-06-17 MED ORDER — ACETAMINOPHEN 650 MG RE SUPP: 650.0000 mg | Freq: Four times a day (QID) | RECTAL | Status: DC | PRN

## 2015-06-17 MED ORDER — SODIUM CHLORIDE 0.9 % IV SOLN
80.0000 mg | Freq: Once | INTRAVENOUS | Status: AC
  Administered 2015-06-17: 80 mg via INTRAVENOUS
  Filled 2015-06-17: qty 80

## 2015-06-17 MED ORDER — DARBEPOETIN ALFA 100 MCG/0.5ML IJ SOSY
100.0000 ug | PREFILLED_SYRINGE | INTRAMUSCULAR | Status: DC
  Administered 2015-06-17: 100 ug via SUBCUTANEOUS
  Filled 2015-06-17: qty 0.5

## 2015-06-17 MED ORDER — SODIUM CHLORIDE 0.9 % IV SOLN
8.0000 mg/h | INTRAVENOUS | Status: AC
  Administered 2015-06-17 – 2015-06-20 (×5): 8 mg/h via INTRAVENOUS
  Filled 2015-06-17 (×12): qty 80

## 2015-06-17 MED ORDER — ZOLPIDEM TARTRATE 5 MG PO TABS: 5.0000 mg | ORAL_TABLET | Freq: Every evening | ORAL | Status: DC | PRN

## 2015-06-17 MED ORDER — METOPROLOL TARTRATE 1 MG/ML IV SOLN
5.0000 mg | Freq: Three times a day (TID) | INTRAVENOUS | Status: DC
  Administered 2015-06-17 (×2): 5 mg via INTRAVENOUS
  Filled 2015-06-17 (×2): qty 5

## 2015-06-17 MED ORDER — DIPHENHYDRAMINE HCL 25 MG PO CAPS
25.0000 mg | ORAL_CAPSULE | Freq: Once | ORAL | Status: DC
  Filled 2015-06-17: qty 1

## 2015-06-17 MED ORDER — CALCIUM CARBONATE 1250 MG/5ML PO SUSP: 500.0000 mg | Freq: Four times a day (QID) | ORAL | Status: DC | PRN

## 2015-06-17 MED ORDER — METOPROLOL TARTRATE 1 MG/ML IV SOLN
7.5000 mg | Freq: Three times a day (TID) | INTRAVENOUS | Status: DC
  Administered 2015-06-17 – 2015-06-19 (×5): 7.5 mg via INTRAVENOUS
  Filled 2015-06-17 (×5): qty 10

## 2015-06-17 MED ORDER — DOCUSATE SODIUM 283 MG RE ENEM
1.0000 | ENEMA | RECTAL | Status: DC | PRN
  Filled 2015-06-17: qty 1

## 2015-06-17 MED ORDER — ONDANSETRON HCL 4 MG PO TABS: 4.0000 mg | ORAL_TABLET | Freq: Four times a day (QID) | ORAL | Status: DC | PRN

## 2015-06-17 MED ORDER — DELFLEX-LC/4.25% DEXTROSE 483 MOSM/L IP SOLN
INTRAPERITONEAL | Status: DC
  Administered 2015-06-17: 5000 mL via INTRAPERITONEAL
  Administered 2015-06-18: 25000 mL via INTRAPERITONEAL

## 2015-06-17 MED ORDER — NEPRO/CARBSTEADY PO LIQD: 237.0000 mL | Freq: Three times a day (TID) | ORAL | Status: DC | PRN

## 2015-06-17 MED ORDER — HYDROXYZINE HCL 25 MG PO TABS: 25.0000 mg | ORAL_TABLET | Freq: Three times a day (TID) | ORAL | Status: DC | PRN

## 2015-06-17 NOTE — Progress Notes (Signed)
Pt alerted to Hbg value 6.7 @ 11:15. At this time pt is unwilling to sign consent for blood infusion. He wishes to speak with his mother who will visit after. Church today. MD made aware.

## 2015-06-17 NOTE — Progress Notes (Signed)
Patient refused scheduled insulin coverage and Heparin SQ injections. Patient said, "I don't take them [insulin and heparin] at home, so I shouldn't have to here". RN discussed with patient the purpose of these medications. Patient stated that he understood, but still does not want them. Patient agreed to have CBG done every 4 hours as ordered. Will continue to monitor.   Hart Rochester, RN, BSN

## 2015-06-17 NOTE — Consult Note (Signed)
Subjective:   HPI  The patient is a 44 year old male with diabetes, hypertension, and end-stage renal disease who is on peritoneal dialysis. We are asked to see him in regards to vomiting and hematemesis. Patient states that he started to have vomiting yesterday and it was a clear yellowish fluid. Today he started to vomit again and it was coffee grounds in appearance. There has been some drop in his hemoglobin 2 days ago it was 7.8 and now with 6.7 g. He has discussed with Dr. Grandville Silos about getting a blood transfusion and also starting on a pantoprazole drip, but the patient doesn't want to have a blood transfusion or a pantoprazole drip at this time until he speaks to his mother. He does not appear in any acute distress. He has not vomited bright red blood.  Review of Systems No chest pain or shortness of breath  Past Medical History  Diagnosis Date  . Diabetes mellitus without complication ([place])   . Hypertension   . Renal disorder    History reviewed. No pertinent past surgical history. Social History   Social History  . Marital Status: Single    Spouse Name: N/A  . Number of Children: N/A  . Years of Education: N/A   Occupational History  . Not on file.   Social History Main Topics  . Smoking status: Current Every Day Smoker    Types: Cigarettes  . Smokeless tobacco: Not on file  . Alcohol Use: No  . Drug Use: Yes    Special: Marijuana  . Sexual Activity: Not on file   Other Topics Concern  . Not on file   Social History Narrative   family history is not on file.  Current facility-administered medications:  .  0.9 %  sodium chloride infusion, 250 mL, Intravenous, PRN, Theressa Millard, MD .  0.9 %  sodium chloride infusion, , Intravenous, Once, Irine Seal V, MD .  acetaminophen (TYLENOL) tablet 650 mg, 650 mg, Oral, Q6H PRN **OR** acetaminophen (TYLENOL) suppository 650 mg, 650 mg, Rectal, Q6H PRN, Theressa Millard, MD .  acetaminophen (TYLENOL) tablet 650  mg, 650 mg, Oral, Q6H PRN **OR** acetaminophen (TYLENOL) suppository 650 mg, 650 mg, Rectal, Q6H PRN, Eugenie Filler, MD .  acetaminophen (TYLENOL) tablet 650 mg, 650 mg, Oral, Once, Eugenie Filler, MD .  alum & mag hydroxide-simeth (MAALOX/MYLANTA) 200-200-20 MG/5ML suspension 30 mL, 30 mL, Oral, Q6H PRN, Theressa Millard, MD .  amLODipine (NORVASC) tablet 5 mg, 5 mg, Oral, BID, Theressa Millard, MD, 5 mg at 06/16/15 2102 .  calcium carbonate (dosed in mg elemental calcium) suspension 500 mg of elemental calcium, 500 mg of elemental calcium, Oral, Q6H PRN, Eugenie Filler, MD .  camphor-menthol [facility]) lotion 1 application, 1 application, Topical, [id number] PRN **AND** hydrOXYzine (ATARAX/VISTARIL) tablet 25 mg, 25 mg, Oral, Q8H PRN, Eugenie Filler, MD .  dialysis solution 2.5% low-MG/low-CA dianeal solution, , Intraperitoneal, Q24H, Roney Jaffe, MD, 5,000 mL at 06/16/15 1920 .  dialysis solution 4.25% low-MG/low-CA dianeal solution, , Intraperitoneal, Q24H, Roney Jaffe, MD, 5,000 mL at 06/16/15 1920 .  digoxin (LANOXIN) tablet 0.125 mg, 0.125 mg, Oral, QODAY, Theressa Millard, MD, 0.125 mg at 06/17/15 1000 .  diphenhydrAMINE (BENADRYL) capsule 25 mg, 25 mg, Oral, Once, Irine Seal V, MD .  docusate sodium [facility]) enema 283 mg, 1 enema, Rectal, PRN, Eugenie Filler, MD .  feeding supplement (NEPRO CARB STEADY) liquid 237 mL, 237 mL, Oral, TID PRN, Eugenie Filler,  MD .  furosemide (LASIX) tablet 160 mg, 160 mg, Oral, BID, Irine Seal V, MD .  gentamicin cream (GARAMYCIN) 0.1 % 1 application, 1 application, Topical, Daily, Donato Heinz, MD, Stopped at 06/16/15 1305 .  heparin 1000 unit/ml injection 2,500 Units, 2,500 Units, Intraperitoneal, PRN, Donato Heinz, MD .  hydrALAZINE (APRESOLINE) injection 10 mg, 10 mg, Intravenous, Q6H PRN, Irine Seal V, MD .  HYDROmorphone (DILAUDID) injection 0.5-1 mg, 0.5-1 mg, Intravenous, Q3H PRN, Theressa Millard, MD .   insulin aspart (novoLOG) injection 0-9 Units, 0-9 Units, Subcutaneous, 6 times per day, Theressa Millard, MD, 0 Units at 06/16/15 0400 .  isosorbide-hydrALAZINE (BIDIL) 20-37.5 MG per tablet 1 tablet, 1 tablet, Oral, TID, Eugenie Filler, MD, 1 tablet at 06/17/15 1119 .  lanthanum (FOSRENOL) chewable tablet 1,000 mg, 1,000 mg, Oral, TID WC, Eugenie Filler, MD, 1,000 mg at 06/17/15 0855 .  losartan (COZAAR) tablet 25 mg, 25 mg, Oral, Daily, Irine Seal V, MD, 25 mg at 06/17/15 1119 .  metoprolol (LOPRESSOR) injection 5 mg, 5 mg, Intravenous, 3 times per day, Eugenie Filler, MD, 5 mg at 06/17/15 0929 .  multivitamin (RENA-VIT) tablet 1 tablet, 1 tablet, Oral, Daily, Theressa Millard, MD, 1 tablet at 06/16/15 1102 .  ondansetron (ZOFRAN) tablet 4 mg, 4 mg, Oral, Q6H PRN **OR** ondansetron (ZOFRAN) injection 4 mg, 4 mg, Intravenous, Q6H PRN, Theressa Millard, MD, 4 mg at 06/16/15 1151 .  oxyCODONE (Oxy IR/ROXICODONE) immediate release tablet 5 mg, 5 mg, Oral, Q4H PRN, Theressa Millard, MD, 5 mg at 06/17/15 0310 .  pantoprazole (PROTONIX) 80 mg in sodium chloride 0.9 % 100 mL IVPB, 80 mg, Intravenous, Once, Eugenie Filler, MD, 80 mg at 06/17/15 1120 .  pantoprazole (PROTONIX) 80 mg in sodium chloride 0.9 % 250 mL (0.32 mg/mL) infusion, 8 mg/hr, Intravenous, Continuous, Eugenie Filler, MD, 8 mg/hr at 06/17/15 1121 .  [START ON 06/20/2015] pantoprazole (PROTONIX) injection 40 mg, 40 mg, Intravenous, Q12H, Irine Seal V, MD .  pravastatin (PRAVACHOL) tablet 20 mg, 20 mg, Oral, QPM, Theressa Millard, MD, 20 mg at 06/16/15 1840 .  sodium chloride 0.9 % injection 3 mL, 3 mL, Intravenous, Q12H, Theressa Millard, MD, 3 mL at 06/17/15 1000 .  sodium chloride 0.9 % injection 3 mL, 3 mL, Intravenous, PRN, Theressa Millard, MD .  sorbitol 70 % solution 30 mL, 30 mL, Oral, PRN, Eugenie Filler, MD .  technetium TC 75M diethylenetriame-pentaacetic acid (DTPA) injection 30 milli  Curie, 30 milli Curie, Inhalation, Once PRN, Theressa Millard, MD .  zolpidem (AMBIEN) tablet 5 mg, 5 mg, Oral, QHS PRN, Eugenie Filler, MD No Known Allergies   Objective:     BP 176/113 mmHg  Pulse 87  Temp(Src) 98.2 F (36.8 C) (Oral)  Resp 20  Ht 6' (1.829 m)  Wt 94.8 kg (208 lb 15.9 oz)  BMI 28.34 kg/m2  SpO2 100%  He is in no distress  Nonicteric  Heart regular rhythm  Lungs clear  Abdomen: Bowel sounds present, soft, nontender  Laboratory No components found for: D1    Assessment:     Hematemesis characterized by coffee-ground emesis. This is associated with anemia. He also has end-stage renal disease and is on per [facility] dialysis, hypertension, and diabetes mellitus      Plan:     I agree with Dr. Carolann Littler wishes to begin pantoprazole and also give a blood transfusion. I spoke to the patient about  EGD. Although I don't take its an emergency I told him I thought it was probably something that should be done for diagnostic purposes. He states he doesn't want to have it done. He had 2 family members die according to him after endoscopy. We will follow clinically. Lab Results  Component Value Date   HGB 6.7* 06/17/2015   HGB 7.0* 06/16/2015   HGB 7.8* 06/15/2015   HCT 19.9* 06/17/2015   HCT 20.4* 06/16/2015   HCT 22.9* 06/15/2015

## 2015-06-17 NOTE — Progress Notes (Signed)
Nurse made second attempt to obtain consent for blood transfusion. Patient stated he will consider it when his mother comes after church.

## 2015-06-17 NOTE — Progress Notes (Signed)
[facility] Progress Note   Subjective: creat 29 > 27 this am.  Net UF +L after 7 dwells of 1.5 hours each w combination of 2.5% and 4.25%.   Filed Vitals:   06/17/15 1400 06/17/15 1417 06/17/15 1429 06/17/15 1430  BP: 168/96 166/95 171/104 171/104  Pulse: 94   88  Temp: 98.5 F (36.9 C)   98.1 F (36.7 C)  TempSrc: Oral   Oral  Resp: 18   22  Height:      Weight:      SpO2: 96%       Inpatient medications: . sodium chloride   Intravenous Once  . acetaminophen  650 mg Oral Once  . amLODipine  5 mg Oral BID  . dialysis solution 2.5% low-MG/low-CA   Intraperitoneal Q24H  . dialysis solution 4.25% low-MG/low-CA   Intraperitoneal Q24H  . digoxin  0.125 mg Oral QODAY  . diphenhydrAMINE  25 mg Oral Once  . furosemide  160 mg Oral BID  . gentamicin cream  1 application Topical Daily  . insulin aspart  0-9 Units Subcutaneous 6 times per day  . isosorbide-hydrALAZINE  1 tablet Oral TID  . lanthanum  1,000 mg Oral TID WC  . losartan  25 mg Oral Daily  . metoprolol  5 mg Intravenous 3 times per day  . multivitamin  1 tablet Oral Daily  . pantoprazole (PROTONIX) IV  80 mg Intravenous Once  . [START ON 06/20/2015] pantoprazole (PROTONIX) IV  40 mg Intravenous Q12H  . pravastatin  20 mg Oral QPM  . sodium chloride  3 mL Intravenous Q12H   . pantoprozole (PROTONIX) infusion     sodium chloride, acetaminophen **OR** acetaminophen, acetaminophen **OR** acetaminophen, alum & mag hydroxide-simeth, calcium carbonate (dosed in mg elemental calcium), camphor-menthol **AND** hydrOXYzine, docusate sodium, feeding supplement (NEPRO CARB STEADY), heparin, hydrALAZINE, HYDROmorphone (DILAUDID) injection, ondansetron **OR** ondansetron (ZOFRAN) IV, oxyCODONE, sodium chloride, sorbitol, technetium TC 50M diethylenetriame-pentaacetic acid, zolpidem  Exam: Alert, coughing Sclera anicteric, throat clear No jvd Chest occ basilar rales bilat, no bronchial BS Cor no MRG Abd soft ntnd  no ascites or HSM +bs GU normal male Ext trace pretib edema bilat Neuro is alert nf Ox 3   CCPD regimen > 6 exchanges/ 24 hour, 5 dwells overnight of 3L and daybag of 2L . Says he is supposed to be doing a "pause" also but does not do it.   CXR fluid in fissure, R effusion  Assessment: 1 Uremia/ underdialysis - remains uremic, vomiting, tired. B/Cr not much better after one night of in-hosp PD (about 10 hours of dwell time over 7 dwells). Also net UF was + 1L which is unusual.  Not sure if membrane is working. Will run PD again tonight and recheck labs in am.  I counseled patient that we will have to do HD if the PD isn't working, he is reluctant to switch but wants to talk with his family who are driving in from [place] today.   2 ESRD on PD x 1 year 3 HTN 3 bp meds, BP's on high side 4 DM on insulin 5 Anemia Hb 6.7, tfs 72 % 6 Vol - looks vol up slighlty  Plan - PD tonight as above. Getting prbc's today. Start esa.    Kelly Splinter MD [facility] pager [phone]    cell [phone] 06/17/2015, 4:08 PM    Recent Labs Lab 06/15/15 2021 06/16/15 0634 06/17/15 0543  NA 140 140 140  K 5.5* 5.7* 5.1  CL 105 105 103  CO2 18* 18* 21*  GLUCOSE 109* 117* 111*  BUN 149* 151* 149*  CREATININE 29.20* 29.67* 26.64*  CALCIUM 8.1* 7.7* 7.6*  PHOS  --   --  8.8*    Recent Labs Lab 06/17/15 0543  ALBUMIN 2.5*    Recent Labs Lab 06/15/15 2021 06/16/15 0634 06/17/15 1011  WBC 7.3 8.8 8.0  HGB 7.8* 7.0* 6.7*  HCT 22.9* 20.4* 19.9*  MCV 86.4 86.1 86.5  PLT 177 158 164

## 2015-06-17 NOTE — Progress Notes (Signed)
[facility] PROGRESS NOTE  George [name] [id number] DOB: 11/25/1970 DOA: 06/15/2015 PCP: No primary care provider on file.  Assessment/Plan: #1 uremia/under dialysis/end-stage renal disease on peritoneal dialysis Patient presented with uremia likely under dialysis as he has missed some peritoneal dialysis. Patient denies any chest pain. Patient denies any shortness of breath. Patient sitting at bedside vomiting melanotic vomitus. Patient on peritoneal dialysis. Per nephrology.  #2 hematemesis Patient sitting at bedside when hematemesis. Vomitus is melanotic in nature. Patient does endorse recent use of ibuprofen 800 mg 4 times daily for right leg pain. Patient denies daily chronic alcohol use. Will place patient on a Protonix drip. Stat CBC now. If hemoglobin less than 7 will transfuse 2 units of packed red blood cells. Keep nothing by mouth for now. Consult with GI for further evaluation and management.  #3 hypertension Place on IV Lopressor. Continue Norvasc, digoxin, BiDil, Cozaar.  #4 diabetes mellitus Patient on oral diabetic medications at home. Check a hemoglobin A1c. Continue sliding scale insulin. Hold oral hypoglycemic agents. Follow.  #5 anemia Questionable etiology. Anemia panel with iron of 145 TIBC of 200 ferritin of 846 consistent with anemia of chronic disease likely secondary to end-stage renal disease on hemodialysis. Patient now with hematemesis. Check a stat CBC. Place on a Protonix drip. Transfusion threshold hemoglobin less than 7. GI consultation. Follow.  #6 elevated troponins Patient denies any acute chest pain. Troponins trending down..If acute coronary syndrome may be secondary to problem #1. 2-D echo pending. Continue beta blocker, ARB, BiDil, digoxin. Follow.  #7 shortness of breath Likely secondary to hypervolemia and under hemodialysis. VQ scan negative for PE. Lower extremity Dopplers pending. Patient on CCPD per renal  #8 prophylaxis PPI for GI  prophylaxis. Discontinue heparin and place on SCDs secondary to hematemesis.  Code Status: Full Family Communication: Updated patient. No family at bedside. Disposition Plan: Remain in stepdown unit.   Consultants:  Nephrology: Dr. Jonnie Finner 06/16/2015  Procedures:  V/Q scan 06/16/2015  CXR 06/16/2015  Antibiotics:  None  HPI/Subjective: Patient sitting at the edge of the bed vomiting. Vomitus is melanotic in nature. Patient stated he took a bite of his meal started to cough and started vomiting. Patient endorses recent use of ibuprofen 800 mg 4 times daily for right leg pain. Patient denies any chest pain. No shortness of breath. No abdominal pain. Patient denies any leg pain. Patient on PD.  Objective: Filed Vitals:   06/17/15 0355 06/17/15 0700  BP:  175/101  Pulse: 94   Temp: 98.3 F (36.8 C)   Resp: 17     Intake/Output Summary (Last 24 hours) at 06/17/15 0928 Last data filed at 06/16/15 1851  Gross per 24 hour  Intake    400 ml  Output    580 ml  Net   -180 ml   Filed Weights   06/15/15 2007 06/16/15 1509  Weight: 91.627 kg (202 lb) 94.8 kg (208 lb 15.9 oz)    Exam:   General:  Sitting up at edge of bed vommitting.  Cardiovascular: RRR  Respiratory: CTAB  Abdomen: Soft, nontender, nondistended, positive bowel sounds.  Musculoskeletal: No clubbing cyanosis or edema.  Data Reviewed: Basic Metabolic Panel:  Recent Labs Lab 06/15/15 2021 06/16/15 0634 06/17/15 0543  NA 140 140 140  K 5.5* 5.7* 5.1  CL 105 105 103  CO2 18* 18* 21*  GLUCOSE 109* 117* 111*  BUN 149* 151* 149*  CREATININE 29.20* 29.67* 26.64*  CALCIUM 8.1* 7.7* 7.6*  PHOS  --   --  8.8*   Liver Function Tests:  Recent Labs Lab 06/17/15 0543  ALBUMIN 2.5*   No results for input(s): LIPASE, AMYLASE in the last 168 hours. No results for input(s): AMMONIA in the last 168 hours. CBC:  Recent Labs Lab 06/15/15 2021 06/16/15 0634  WBC 7.3 8.8  HGB 7.8* 7.0*  HCT 22.9*  20.4*  MCV 86.4 86.1  PLT 177 158   Cardiac Enzymes:  Recent Labs Lab 06/15/15 2021 06/16/15 0007 06/16/15 1352 06/16/15 1910 06/17/15 0543  TROPONINI 0.08* 0.07* 0.10* 0.07* 0.08*   BNP (last 3 results) No results for input(s): BNP in the last 8760 hours.  ProBNP (last 3 results) No results for input(s): PROBNP in the last 8760 hours.  CBG:  Recent Labs Lab 06/16/15 1740 06/16/15 2029 06/17/15 0103 06/17/15 0355 06/17/15 0837  GLUCAP 98 157* 129* 144* 153*    Recent Results (from the past 240 hour(s))  MRSA PCR Screening     Status: None   Collection Time: 06/16/15  3:47 PM  Result Value Ref Range Status   MRSA by PCR NEGATIVE NEGATIVE Final    Comment:        The GeneXpert MRSA Assay (FDA approved for NASAL specimens only), is one component of a comprehensive MRSA colonization surveillance program. It is not intended to diagnose MRSA infection nor to guide or monitor treatment for MRSA infections.      Studies: Dg Chest 2 View  06/15/2015  CLINICAL DATA:  Right-sided chest pain.  Cough for 3 days. EXAM: CHEST  2 VIEW COMPARISON:  None FINDINGS: Heart size is normal. Small right pleural effusion noted. No left effusion identified. There is pleural and parenchymal scarring within the right midlung and right base noted. Left lung appears clear. IMPRESSION: 1. Chronic appearing pleural and parenchymal scarring involving the right midlung and right base. The over 2 suspect small right effusion. Electronically Signed   By: Kerby Moors M.D.   On: 06/15/2015 20:45   Nm Pulmonary Perf And Vent  06/16/2015  CLINICAL DATA:  Right-sided chest pain and shortness of breath. EXAM: NUCLEAR MEDICINE VENTILATION - PERFUSION LUNG SCAN TECHNIQUE: Ventilation images were obtained in multiple projections using inhaled aerosol Tc-7m DTPA. Perfusion images were obtained in multiple projections after intravenous injection of Tc-101m MAA. RADIOPHARMACEUTICALS:  30 millicuries  [id number] DTPA aerosol inhalation and 4.1 Technetium-61m MAA IV COMPARISON:  None. FINDINGS: Ventilation: No focal ventilation defect. The ventilated portion of the right lung is smaller than the left, corresponding with the chest radiograph abnormality. There is generalized decreased ventilation to the right midlung. Perfusion: No wedge shaped peripheral perfusion defects to suggest acute pulmonary embolism. IMPRESSION: Low probability for acute pulmonary embolus. Electronically Signed   By: Kerby Moors M.D.   On: 06/16/2015 15:16    Scheduled Meds: . amLODipine  5 mg Oral BID  . aspirin EC  81 mg Oral Daily  . dialysis solution 2.5% low-MG/low-CA   Intraperitoneal Q24H  . dialysis solution 4.25% low-MG/low-CA   Intraperitoneal Q24H  . digoxin  0.125 mg Oral QODAY  . furosemide  160 mg Oral BID  . gentamicin cream  1 application Topical Daily  . heparin  5,000 Units Subcutaneous 3 times per day  . insulin aspart  0-9 Units Subcutaneous 6 times per day  . isosorbide-hydrALAZINE  1 tablet Oral TID  . lanthanum  1,000 mg Oral TID WC  . losartan  25 mg Oral Daily  . metoprolol  5 mg Intravenous 3 times per day  .  multivitamin  1 tablet Oral Daily  . pantoprazole (PROTONIX) IV  80 mg Intravenous Once  . [START ON 06/20/2015] pantoprazole (PROTONIX) IV  40 mg Intravenous Q12H  . pravastatin  20 mg Oral QPM  . sodium chloride  3 mL Intravenous Q12H   Continuous Infusions: . pantoprozole (PROTONIX) infusion      Principal Problem:   Uremia Active Problems:   ESRD on peritoneal dialysis ([place])   Hematemesis   Hypoxia   Acute respiratory failure ([facility])   Diabetes mellitus without complication ([place])   Essential hypertension   Pleuritic chest pain   SOB (shortness of breath)    Time spent: [place] MD [facility] Pager [phone]. If 7PM-7AM, please contact night-coverage at [email or web address], password [email or web address] 06/17/2015, 9:28 AM  LOS: 1 day

## 2015-06-17 NOTE — Progress Notes (Signed)
Refused morning labs. Agreed to have them rescheduled to be taken at 0600. K. Sofia with [facility] was notified. Will continue to monitor.   Hart [place], RN, BSN

## 2015-06-17 NOTE — Progress Notes (Signed)
Pt refusing cbg at this time.

## 2015-06-18 ENCOUNTER — Inpatient Hospital Stay (HOSPITAL_COMMUNITY): Payer: Medicare Other

## 2015-06-18 DIAGNOSIS — K920 Hematemesis: Secondary | ICD-10-CM | POA: Insufficient documentation

## 2015-06-18 DIAGNOSIS — R52 Pain, unspecified: Secondary | ICD-10-CM

## 2015-06-18 DIAGNOSIS — N186 End stage renal disease: Secondary | ICD-10-CM

## 2015-06-18 DIAGNOSIS — Z992 Dependence on renal dialysis: Secondary | ICD-10-CM

## 2015-06-18 DIAGNOSIS — R079 Chest pain, unspecified: Secondary | ICD-10-CM

## 2015-06-18 DIAGNOSIS — R0602 Shortness of breath: Secondary | ICD-10-CM

## 2015-06-18 LAB — RENAL FUNCTION PANEL
ALBUMIN: 2.7 g/dL — AB (ref 3.5–5.0)
Anion gap: 17 — ABNORMAL HIGH (ref 5–15)
BUN: 143 mg/dL — ABNORMAL HIGH (ref 6–20)
CALCIUM: 8.4 mg/dL — AB (ref 8.9–10.3)
CO2: 21 mmol/L — AB (ref 22–32)
CREATININE: 22.64 mg/dL — AB (ref 0.61–1.24)
Chloride: 104 mmol/L (ref 101–111)
GFR, EST AFRICAN AMERICAN: 2 mL/min — AB (ref >60)
GFR, EST NON AFRICAN AMERICAN: 2 mL/min — AB (ref >60)
Glucose, Bld: 147 mg/dL — ABNORMAL HIGH (ref 65–99)
PHOSPHORUS: 8 mg/dL — AB (ref 2.5–4.6)
Potassium: 4.3 mmol/L (ref 3.5–5.1)
SODIUM: 142 mmol/L (ref 135–145)

## 2015-06-18 LAB — CBC
HCT: 21.3 % — ABNORMAL LOW (ref 39.0–52.0)
HCT: 23.3 % — ABNORMAL LOW (ref 39.0–52.0)
Hemoglobin: 7.6 g/dL — ABNORMAL LOW (ref 13.0–17.0)
Hemoglobin: 8 g/dL — ABNORMAL LOW (ref 13.0–17.0)
MCH: 29.5 pg (ref 26.0–34.0)
MCH: 29.8 pg (ref 26.0–34.0)
MCHC: 35.7 g/dL (ref 30.0–36.0)
MCHC: 34.3 g/dL (ref 30.0–36.0)
MCV: 83.5 fL (ref 78.0–100.0)
MCV: 86 fL (ref 78.0–100.0)
PLATELETS: 160 10*3/uL (ref 150–400)
PLATELETS: 164 10*3/uL (ref 150–400)
RBC: 2.55 MIL/uL — AB (ref 4.22–5.81)
RBC: 2.71 MIL/uL — ABNORMAL LOW (ref 4.22–5.81)
RDW: 15.2 % (ref 11.5–15.5)
RDW: 15.4 % (ref 11.5–15.5)
WBC: 10.3 10*3/uL (ref 4.0–10.5)
WBC: 11.1 10*3/uL — ABNORMAL HIGH (ref 4.0–10.5)

## 2015-06-18 LAB — GLUCOSE, CAPILLARY
GLUCOSE-CAPILLARY: 144 mg/dL — AB (ref 65–99)
Glucose-Capillary: 129 mg/dL — ABNORMAL HIGH (ref 65–99)
Glucose-Capillary: 90 mg/dL (ref 65–99)
Glucose-Capillary: 105 mg/dL — ABNORMAL HIGH (ref 65–99)

## 2015-06-18 LAB — OCCULT BLOOD X 1 CARD TO LAB, STOOL: Fecal Occult Bld: POSITIVE — AB

## 2015-06-18 LAB — TROPONIN I: TROPONIN I: 0.1 ng/mL — AB (ref <0.031)

## 2015-06-18 LAB — HEMOGLOBIN AND HEMATOCRIT, BLOOD
HEMATOCRIT: 23 % — AB (ref 39.0–52.0)
HEMOGLOBIN: 7.8 g/dL — AB (ref 13.0–17.0)

## 2015-06-18 LAB — HEMOGLOBIN A1C
HEMOGLOBIN A1C: 5.4 % (ref 4.8–5.6)
Mean Plasma Glucose: 108 mg/dL

## 2015-06-18 MED ORDER — DARBEPOETIN ALFA 200 MCG/0.4ML IJ SOSY
200.0000 ug | PREFILLED_SYRINGE | INTRAMUSCULAR | Status: DC
  Filled 2015-06-18: qty 0.4

## 2015-06-18 MED ORDER — AMLODIPINE BESYLATE 10 MG PO TABS
10.0000 mg | ORAL_TABLET | Freq: Every day | ORAL | Status: DC
  Administered 2015-06-18: 10 mg via ORAL
  Filled 2015-06-18: qty 1

## 2015-06-18 MED ORDER — DEXTROSE 5 % IV SOLN
1.5000 g | INTRAVENOUS | Status: AC
  Administered 2015-06-19: 1.5 g via INTRAVENOUS
  Filled 2015-06-18 (×3): qty 1.5

## 2015-06-18 MED ORDER — LOSARTAN POTASSIUM 50 MG PO TABS
25.0000 mg | ORAL_TABLET | Freq: Every day | ORAL | Status: DC
  Administered 2015-06-18: 25 mg via ORAL
  Filled 2015-06-18: qty 1

## 2015-06-18 MED ORDER — DARBEPOETIN ALFA 200 MCG/0.4ML IJ SOSY: 200.0000 ug | PREFILLED_SYRINGE | INTRAMUSCULAR | Status: DC

## 2015-06-18 NOTE — Consult Note (Signed)
[facility] [place]  Patient name: George [name] MRN: [id number] DOB: 1971/02/05 Sex: male  REASON FOR CONSULT: permanent dialysis access and tunneled dialysis catheter placement   HPI: George [name] is a 44 y.o. male who has a history of ESRD on peritoneal dialysis (x 1 years) who presented to the ED on 06/16/15 with complaints of shortness of breath and nausea. The patient's significant other reports that the patient has been noncompliant with PD.  He has never had access procedures before. He has had a previous tunneled dialysis catheter. The patient is right handed. He does not have a pacemaker.   He did have some coffee ground emesis during this admission associated with anemia. GI was consulted and recommended EGD, blood transfusion and pantoprazole drip. The patient has refused all of these recommendations however.   He has past medical history of diabetes managed on oral hypoglycemics. He has hypertension managed on multiple medications. He has hyperlipidemia managed on a statin.   He currently denies any chest discomfort, shortness of breath and nausea. Full ROS below.   Past Medical History  Diagnosis Date  . Diabetes mellitus without complication ([place])   . Hypertension   . Renal disorder     No family history on file.  SOCIAL HISTORY: Social History   Social History  . Marital Status: Single    Spouse Name: N/A  . Number of Children: N/A  . Years of Education: N/A   Occupational History  . Not on file.   Social History Main Topics  . Smoking status: Current Every Day Smoker    Types: Cigarettes  . Smokeless tobacco: Not on file  . Alcohol Use: No  . Drug Use: Yes    Special: Marijuana  . Sexual Activity: Not on file   Other Topics Concern  . Not on file   Social History Narrative    No Known Allergies  Current Facility-Administered Medications  Medication Dose Route Frequency Provider Last Rate Last Dose  . 0.9 %  sodium chloride  infusion  250 mL Intravenous PRN Theressa Millard, MD      . 0.9 %  sodium chloride infusion   Intravenous Once Irine Seal V, MD      . acetaminophen (TYLENOL) tablet 650 mg  650 mg Oral Q6H PRN Theressa Millard, MD       Or  . acetaminophen (TYLENOL) suppository 650 mg  650 mg Rectal Q6H PRN Theressa Millard, MD      . acetaminophen (TYLENOL) tablet 650 mg  650 mg Oral Q6H PRN Eugenie Filler, MD       Or  . acetaminophen (TYLENOL) suppository 650 mg  650 mg Rectal Q6H PRN Eugenie Filler, MD      . acetaminophen (TYLENOL) tablet 650 mg  650 mg Oral Once Eugenie Filler, MD   650 mg at 06/17/15 1428  . amLODipine (NORVASC) tablet 10 mg  10 mg Oral QHS Mauricia Area, MD      . calcium carbonate (dosed in mg elemental calcium) suspension 500 mg of elemental calcium  500 mg of elemental calcium Oral Q6H PRN Eugenie Filler, MD      . camphor-menthol [facility]) lotion 1 application  1 application Topical [id number] PRN Eugenie Filler, MD       And  . hydrOXYzine (ATARAX/VISTARIL) tablet 25 mg  25 mg Oral Q8H PRN Eugenie Filler, MD      . Darbepoetin Alfa (ARANESP) injection 200  mcg  200 mcg Intravenous Q Mon-HD Mauricia Area, MD      . dialysis solution 4.25% low-MG/low-CA dianeal solution   Intraperitoneal Q24H Roney Jaffe, MD   5,000 mL at 06/17/15 2233  . digoxin (LANOXIN) tablet 0.125 mg  0.125 mg Oral QODAY Theressa Millard, MD   0.125 mg at 06/17/15 1000  . diphenhydrAMINE (BENADRYL) capsule 25 mg  25 mg Oral Once Eugenie Filler, MD   25 mg at 06/17/15 1427  . docusate sodium (ENEMEEZ) enema 283 mg  1 enema Rectal PRN Eugenie Filler, MD      . feeding supplement (NEPRO CARB STEADY) liquid 237 mL  237 mL Oral TID PRN Eugenie Filler, MD      . gentamicin cream (GARAMYCIN) 0.1 % 1 application  1 application Topical Daily Donato Heinz, MD   Stopped at 06/16/15 1305  . heparin 1000 unit/ml injection 2,500 Units  2,500 Units Intraperitoneal PRN Donato Heinz, MD      . hydrALAZINE (APRESOLINE) injection 10 mg  10 mg Intravenous Q6H PRN Eugenie Filler, MD   10 mg at 06/17/15 1901  . HYDROmorphone (DILAUDID) injection 0.5-1 mg  0.5-1 mg Intravenous Q3H PRN Theressa Millard, MD      . insulin aspart (novoLOG) injection 0-9 Units  0-9 Units Subcutaneous 6 times per day Theressa Millard, MD   0 Units at 06/16/15 0400  . isosorbide-hydrALAZINE (BIDIL) 20-37.5 MG per tablet 1 tablet  1 tablet Oral TID Eugenie Filler, MD   1 tablet at 06/17/15 2246  . lanthanum (FOSRENOL) chewable tablet 1,000 mg  1,000 mg Oral TID WC Eugenie Filler, MD   1,000 mg at 06/17/15 0855  . losartan (COZAAR) tablet 25 mg  25 mg Oral QHS Mauricia Area, MD      . metoprolol (LOPRESSOR) injection 7.5 mg  7.5 mg Intravenous 3 times per day Eugenie Filler, MD   7.5 mg at 06/18/15 0522  . multivitamin (RENA-VIT) tablet 1 tablet  1 tablet Oral Daily Theressa Millard, MD   1 tablet at 06/16/15 1102  . ondansetron (ZOFRAN) tablet 4 mg  4 mg Oral Q6H PRN Theressa Millard, MD       Or  . ondansetron (ZOFRAN) injection 4 mg  4 mg Intravenous Q6H PRN Theressa Millard, MD   4 mg at 06/18/15 0617  . oxyCODONE (Oxy IR/ROXICODONE) immediate release tablet 5 mg  5 mg Oral Q4H PRN Theressa Millard, MD   5 mg at 06/17/15 0310  . pantoprazole (PROTONIX) 80 mg in sodium chloride 0.9 % 250 mL (0.32 mg/mL) infusion  8 mg/hr Intravenous Continuous Eugenie Filler, MD 25 mL/hr at 06/18/15 0620 8 mg/hr at 06/18/15 0620  . [START ON 06/20/2015] pantoprazole (PROTONIX) injection 40 mg  40 mg Intravenous Q12H Irine Seal V, MD      . pravastatin (PRAVACHOL) tablet 20 mg  20 mg Oral QPM Theressa Millard, MD   20 mg at 06/17/15 1715  . sodium chloride 0.9 % injection 3 mL  3 mL Intravenous Q12H Theressa Millard, MD   3 mL at 06/17/15 2250  . sodium chloride 0.9 % injection 3 mL  3 mL Intravenous PRN Theressa Millard, MD      . sorbitol 70 % solution 30 mL  30 mL Oral  PRN Eugenie Filler, MD      . technetium TC 78M diethylenetriame-pentaacetic acid (DTPA) injection 30 milli [place]  [place] Inhalation Once PRN Theressa Millard, MD      . zolpidem (AMBIEN) tablet 5 mg  5 mg Oral QHS PRN Eugenie Filler, MD        REVIEW OF SYSTEMS:  [X]  denotes positive finding, [ ]  denotes negative finding Cardiac  Comments:  Chest pain or chest pressure:    Shortness of breath upon exertion:    Short of breath when lying flat:    Irregular heart rhythm:        Vascular    Pain in calf, thigh, or hip brought on by ambulation:    Pain in feet at night that wakes you up from your sleep:     Blood clot in your veins:    Leg swelling:         Pulmonary    Oxygen at home:    Productive cough:     Wheezing:         Neurologic    Sudden weakness in arms or legs:     Sudden numbness in arms or legs:     Sudden onset of difficulty speaking or slurred speech:    Temporary loss of vision in one eye:     Problems with dizziness:         Gastrointestinal    Blood in stool:     Vomited blood:         Genitourinary    Burning when urinating:     Blood in urine:        Psychiatric    Major depression:         Hematologic    Bleeding problems:    Problems with blood clotting too easily:        Skin    Rashes or ulcers:        Constitutional    Fever or chills:      PHYSICAL EXAM: Filed Vitals:   06/18/15 0110 06/18/15 0209 06/18/15 0418 06/18/15 0758  BP: 170/81 166/93 149/97 173/105  Pulse: 99  104 82  Temp:   98.3 F (36.8 C) 98 F (36.7 C)  TempSrc:   Oral Oral  Resp: 23 20 19    Height:      Weight:      SpO2: 94%  98% 97%    GENERAL: The patient is a well-nourished male, in no acute distress. Appears sleepy. The vital signs are documented above. CARDIAC: There is a regular rate and rhythm.  VASCULAR: Palpable radial pulses bilaterally. Palpable left brachial pulse. Nonpalpable right brachial pulse.  PULMONARY:Non labored  breathing.  ABDOMEN: Soft. PD catheter in place.  MUSCULOSKELETAL: There are no major deformities or cyanosis. NEUROLOGIC: No focal weakness or paresthesias are detected. SKIN: There are no ulcers or rashes noted. PSYCHIATRIC: The patient has a normal affect.  DATA:  Vein mapping pending.   MEDICAL ISSUES:  ESRD on HD The patient has been admitted with uremia secondary to noncompliance with peritoneal dialysis. Vein mapping is pending. The patient is right handed. Will make access plans based on vein mapping results. Plan for access placement and tunneled dialysis catheter placement this week. Dr. Donnetta Hutching to evaluate patient.   Hematemesis On protonix drip. Has refused EGD. GI following.   Alvia Grove, PA-C [facility] [place] [phone]     I have examined the patient, reviewed and agree with above. Complex patient who is a failed peritoneal dialysis. Very uremic unable to stay awake. Did explain the need  for hemodialysis. He did have a catheter for short period times understands this. Also discussed with his significant other present with him. She reports this is not his usual behavior. On physical exam he does have a large caliber cephalic vein from the wrist to the forearm. He by physical exam has thrombosis of this vein at the wrist onto the dorsum of his hand. Does seem that he has enough patent vein above this for a cephalic vein fistula. Does have a 2+ radial pulse. Will obtain vein mapping today. We'll plan tunneled catheter for acute hemodialysis tomorrow and left arm access for long-term dialysis tomorrow as well. The patient is right handed  Curt Jews, MD 06/18/2015 2:32 PM

## 2015-06-18 NOTE — Progress Notes (Signed)
Right  Upper Extremity Vein Map    Cephalic  Segment Diameter Depth Comment  1. Axilla 2.41mm mm   2. Mid upper arm 3.72mm mm   3. Above AC 4.88mm mm   4. In [facility] 3.55mm mm   5. Below AC mm mm bandages  6. Mid forearm mm mm   7. Wrist mm mm    mm mm    mm mm    mm mm    Basilic  Segment Diameter Depth Comment  1. Axilla 5.22mm mm   2. Mid upper arm mm mm   3. Above AC 5.34mm mm   4. In AC 83mm mm   5. Below AC mm mm bandages  6. Mid forearm mm mm   7. Wrist mm mm    mm mm    mm mm    mm mm    Left Upper Extremity Vein Map    Cephalic  Segment Diameter Depth Comment  1. Axilla 2.79mm mm   2. Mid upper arm 2.28mm mm   3. Above AC 3.75mm mm   4. In AC 33mm mm   5. Below AC 3.59mm mm   6. Mid forearm 2.51mm mm   7. Wrist mm mm thrombosed   mm mm    mm mm    mm mm    Basilic  Segment Diameter Depth Comment  1. Axilla mm mm   2. Mid upper arm mm mm   3. Above AC 2.71mm 22mm   4. In AC 3.94mm 4.24mm   5. Below AC 3.17mm 3.23mm   6. Mid forearm 2.34mm 3.86mm   7. Wrist mm mm    mm mm    mm mm    mm mm

## 2015-06-18 NOTE — Progress Notes (Signed)
[facility] PROGRESS NOTE  Gianlucas Govin [id number] DOB: 09/20/1970 DOA: 06/15/2015 PCP: No primary care provider on file.  Assessment/Plan: #1 uremia/under dialysis/end-stage renal disease on peritoneal dialysis Patient presented with uremia likely under dialysis as he has missed some peritoneal dialysis. Patient denies any chest pain. Patient denies any shortness of breath.Patient on peritoneal dialysis. Patient to have vein mapping and and plans for access placement and tunneled dialysis catheter placement this week per vascular surgery. Per nephrology.  #2 hematemesis/coffee-ground emesis Patient with coffee-ground emesis on 06/17/2015. Patient endorsed recent use of ibuprofen 800 mg 4 times daily for right leg pain. Patient denies daily chronic alcohol use. Per wife patient with prior history of duodenal ulcer. Continue Protonix drip. Patient status post 2 units packed red blood cells hemoglobin currently at 7.6 from 6.7 yesterday. Patient was seen in consultation by gastroenterology, Dr. Penelope Coop who had recommended an upper endoscopy however patient refused secondary to a history of death amongst family members after endoscopy. Repeat H&H this afternoon. Transfusion threshold hemoglobin less than 7. GI following and appreciate input and recommendations. If patient continues to refuse endoscopy by Wednesday, 06/20/2015 then will likely need to get upper GI series for further evaluation.  #3 hypertension Continue IV Lopressor. Continue Norvasc, digoxin, BiDil, Cozaar.  #4 well-controlled diabetes mellitus Patient on oral diabetic medications at home. Hemoglobin A1c = 5.4. Continue sliding scale insulin. Hold oral hypoglycemic agents. Follow.  #5 anemia Questionable etiology. Likely multifactorial secondary to acute blood loss anemia and anemia of chronic disease. Anemia panel with iron of 145 TIBC of 200 ferritin of 846 consistent with anemia of chronic disease likely secondary to  end-stage renal disease on hemodialysis. Patient with coffee-ground emesis yesterday. CBC was 6.7 yesterday. Patient status post 2 units packed red blood cells hemoglobin currently at 7.6. Continue Protonix drip. Transfusion threshold hemoglobin less than 7. GI following. Follow.  #6 elevated troponins Patient denies any acute chest pain. Troponins trending down..If acute coronary syndrome may be secondary to problem #1. 2-D echo with EF of 60-65% with severe LVH, grade 1 diastolic dysfunction. Severe nodular posterior annular calcification on the mitral valve. Continue beta blocker, ARB, BiDil, digoxin. Follow.  #7 shortness of breath Likely secondary to hypervolemia and under hemodialysis. VQ scan negative for PE. Lower extremity Dopplers pending. Patient on CPPD per renal  #8 prophylaxis PPI for GI prophylaxis. SCDs for DVT prophylaxis.   Code Status: Full Family Communication: Updated patient. No family at bedside. Disposition Plan: Remain in stepdown unit.   Consultants:  Nephrology: Dr. Jonnie Finner 06/16/2015  Gastroenterology: Dr. Penelope Coop 06/17/2015  Procedures:  V/Q scan 06/16/2015  CXR 06/16/2015  2-D echo 06/18/2015  2 units packed red blood cells 06/17/2015  Antibiotics:  None  HPI/Subjective: Patient denies any further coffee-ground emesis. Per nursing had an episode of emesis that was yellowish with some red streaks. No chest pain. No shortness of breath. Patient on CPPD  Objective: Filed Vitals:   06/18/15 0418 06/18/15 0758  BP: 149/97 173/105  Pulse: 104 82  Temp: 98.3 F (36.8 C) 98 F (36.7 C)  Resp: 19     Intake/Output Summary (Last 24 hours) at 06/18/15 1030 Last data filed at 06/18/15 0600  Gross per 24 hour  Intake 871.92 ml  Output    400 ml  Net 471.92 ml   Filed Weights   06/15/15 2007 06/16/15 1509  Weight: 91.627 kg (202 lb) 94.8 kg (208 lb 15.9 oz)    Exam:   General:  Laying in bed.  Cardiovascular: RRR  Respiratory:  CTAB  Abdomen: Soft, nontender, nondistended, positive bowel sounds.  Musculoskeletal: No clubbing cyanosis or edema.  Data Reviewed: Basic Metabolic Panel:  Recent Labs Lab 06/15/15 2021 06/16/15 0634 06/17/15 0543 06/18/15 0810  NA 140 140 140 142  K 5.5* 5.7* 5.1 4.3  CL 105 105 103 104  CO2 18* 18* 21* 21*  GLUCOSE 109* 117* 111* 147*  BUN 149* 151* 149* 143*  CREATININE 29.20* 29.67* 26.64* 22.64*  CALCIUM 8.1* 7.7* 7.6* 8.4*  PHOS  --   --  8.8* 8.0*   Liver Function Tests:  Recent Labs Lab 06/17/15 0543 06/18/15 0810  ALBUMIN 2.5* 2.7*   No results for input(s): LIPASE, AMYLASE in the last 168 hours. No results for input(s): AMMONIA in the last 168 hours. CBC:  Recent Labs Lab 06/15/15 2021 06/16/15 0634 06/17/15 1011 06/17/15 2343 06/18/15 0810  WBC 7.3 8.8 8.0 11.1* 10.3  HGB 7.8* 7.0* 6.7* 8.0* 7.6*  HCT 22.9* 20.4* 19.9* 23.3* 21.3*  MCV 86.4 86.1 86.5 86.0 83.5  PLT 177 158 164 160 164   Cardiac Enzymes:  Recent Labs Lab 06/16/15 1352 06/16/15 1910 06/17/15 0543 06/17/15 1211 06/17/15 2343  TROPONINI 0.10* 0.07* 0.08* 0.08* 0.10*   BNP (last 3 results) No results for input(s): BNP in the last 8760 hours.  ProBNP (last 3 results) No results for input(s): PROBNP in the last 8760 hours.  CBG:  Recent Labs Lab 06/17/15 0103 06/17/15 0355 06/17/15 0837 06/17/15 1713 06/18/15 0757  GLUCAP 129* 144* 153* 114* 144*    Recent Results (from the past 240 hour(s))  MRSA PCR Screening     Status: None   Collection Time: 06/16/15  3:47 PM  Result Value Ref Range Status   MRSA by PCR NEGATIVE NEGATIVE Final    Comment:        The GeneXpert MRSA Assay (FDA approved for NASAL specimens only), is one component of a comprehensive MRSA colonization surveillance program. It is not intended to diagnose MRSA infection nor to guide or monitor treatment for MRSA infections.      Studies: Nm Pulmonary Perf And Vent  06/16/2015   CLINICAL DATA:  Right-sided chest pain and shortness of breath. EXAM: NUCLEAR MEDICINE VENTILATION - PERFUSION LUNG SCAN TECHNIQUE: Ventilation images were obtained in multiple projections using inhaled aerosol Tc-20m DTPA. Perfusion images were obtained in multiple projections after intravenous injection of Tc-60m MAA. RADIOPHARMACEUTICALS:  30 millicuries [id number] DTPA aerosol inhalation and 4.1 Technetium-47m MAA IV COMPARISON:  None. FINDINGS: Ventilation: No focal ventilation defect. The ventilated portion of the right lung is smaller than the left, corresponding with the chest radiograph abnormality. There is generalized decreased ventilation to the right midlung. Perfusion: No wedge shaped peripheral perfusion defects to suggest acute pulmonary embolism. IMPRESSION: Low probability for acute pulmonary embolus. Electronically Signed   By: Kerby Moors M.D.   On: 06/16/2015 15:16    Scheduled Meds: . sodium chloride   Intravenous Once  . acetaminophen  650 mg Oral Once  . amLODipine  10 mg Oral QHS  . [START ON 06/25/2015] darbepoetin (ARANESP) injection - DIALYSIS  200 mcg Intravenous Q Mon-HD  . dialysis solution 4.25% low-MG/low-CA   Intraperitoneal Q24H  . digoxin  0.125 mg Oral QODAY  . diphenhydrAMINE  25 mg Oral Once  . gentamicin cream  1 application Topical Daily  . insulin aspart  0-9 Units Subcutaneous 6 times per day  . isosorbide-hydrALAZINE  1 tablet Oral TID  . lanthanum  1,000 mg Oral TID WC  . losartan  25 mg Oral QHS  . metoprolol  7.5 mg Intravenous 3 times per day  . multivitamin  1 tablet Oral Daily  . [START ON 06/20/2015] pantoprazole (PROTONIX) IV  40 mg Intravenous Q12H  . pravastatin  20 mg Oral QPM  . sodium chloride  3 mL Intravenous Q12H   Continuous Infusions: . pantoprozole (PROTONIX) infusion 8 mg/hr (06/18/15 [id number])    Principal Problem:   Uremia Active Problems:   ESRD on peritoneal dialysis ([place])   Hematemesis   Hypoxia   Acute  respiratory failure ([place])   Diabetes mellitus without complication ([place])   Essential hypertension   Pleuritic chest pain   SOB (shortness of breath)    Time spent: [place] MD [facility] Pager [phone]. If 7PM-7AM, please contact night-coverage at [email or web address], password [facility] 06/18/2015, 10:30 AM  LOS: 2 days

## 2015-06-18 NOTE — Progress Notes (Signed)
VASCULAR LAB PRELIMINARY  PRELIMINARY  PRELIMINARY  PRELIMINARY  Bilateral lower extremity venous duplex completed.    Preliminary report:  There is no DVT or SVT noted in the bilateral lower extremities.   Artemis Loyal, RVT 06/18/2015, 3:18 PM

## 2015-06-18 NOTE — Progress Notes (Signed)
George [name] 11:06 AM  Subjective: Patient seen and discussed with the hospital team my partner Dr. Penelope Coop and his sister and he has no new complaints and we had a long talk about endoscopy and the risks benefits methods and options and we answered all of their questions  Objective: Vital signs stable afebrile no acute distress hemoglobin slight decrease significant abnormal BUN and creatinine  Assessment: Multiple medical problems including upper GI bleeding  Plan: They will continue to think about endoscopy but he is currently refusing and would continue clear liquids and if no signs of bleeding by Wednesday and still refusing endoscopy okay to proceed with a upper GI series just to rule out anything significant [facility]  Pager [phone] After 5PM or if no answer call [phone]

## 2015-06-18 NOTE — Progress Notes (Signed)
Aranesp 237mcg IV qMon changed for first dose to be given on 12/26.  SubQ dose of Aranesp 111mcg was given yesterday 12/18.  Aranesp needs to have at least 7 days between administrations due to delayed onset of action as well as increased risk of cardiovascular events (MI, stroke, venous thromboembolism, thrombosis of vascular access).  Please call pharmacy with any questions.  Desjuan Stearns D. Jadden Yim, PharmD, BCPS Clinical Pharmacist Pager: [phone] 06/18/2015 9:33 AM

## 2015-06-18 NOTE — Progress Notes (Signed)
Nutrition Brief Note  Patient identified on the Malnutrition Screening Tool (MST) Report  Wt Readings from Last 15 Encounters:  06/16/15 208 lb 15.9 oz (94.8 kg)    Body mass index is 28.34 kg/(m^2). Patient meets criteria for overweight based on current BMI.   Current diet order is NPO, patient is consuming approximately 30-50% of meals at this time. Labs and medications reviewed.   No nutrition interventions warranted at this time. If nutrition issues arise, please consult RD.   Satira Anis. Any Mcneice, MS, RD LDN After Hours/Weekend Pager [phone]

## 2015-06-18 NOTE — Progress Notes (Signed)
Subjective: Interval History: has no complaint,sister present and she understand his willful nonadherence and need to convert to PD.Marland Kitchen  Objective: Vital signs in last 24 hours: Temp:  [97.9 F (36.6 C)-98.6 F (37 C)] 98.3 F (36.8 C) (12/19 0418) Pulse Rate:  [87-106] 104 (12/19 0418) Resp:  [9-25] 19 (12/19 0418) BP: (149-200)/(81-113) 149/97 mmHg (12/19 0418) SpO2:  [94 %-100 %] 98 % (12/19 0418) Weight change:   Intake/Output from previous day: 12/18 0701 - 12/19 0700 In: 1111.9 [P.O.:240; I.V.:260.9; Blood:611] Out: 1000 [Urine:700; Emesis/NG output:300] Intake/Output this shift:    General appearance: cooperative and mild distress Resp: diminished breath sounds bilaterally, rales bibasilar and wheezes bilaterally Cardio: S1, S2 normal and systolic murmur: holosystolic 2/6, blowing at apex GI: PD cath, striae, pos bs,  liver down 5 cm Extremities: edema 2+  Lab Results:  Recent Labs  06/17/15 1011 06/17/15 2343  WBC 8.0 11.1*  HGB 6.7* 8.0*  HCT 19.9* 23.3*  PLT 164 160   BMET:  Recent Labs  06/16/15 0634 06/17/15 0543  NA 140 140  K 5.7* 5.1  CL 105 103  CO2 18* 21*  GLUCOSE 117* 111*  BUN 151* 149*  CREATININE 29.67* 26.64*  CALCIUM 7.7* 7.6*   No results for input(s): PTH in the last 72 hours. Iron Studies:  Recent Labs  06/16/15 1352  IRON 145  TIBC 200*  FERRITIN 846*    Studies/Results: Nm Pulmonary Perf And Vent  06/16/2015  CLINICAL DATA:  Right-sided chest pain and shortness of breath. EXAM: NUCLEAR MEDICINE VENTILATION - PERFUSION LUNG SCAN TECHNIQUE: Ventilation images were obtained in multiple projections using inhaled aerosol Tc-22m DTPA. Perfusion images were obtained in multiple projections after intravenous injection of Tc-38m MAA. RADIOPHARMACEUTICALS:  30 millicuries [id number] DTPA aerosol inhalation and 4.1 Technetium-65m MAA IV COMPARISON:  None. FINDINGS: Ventilation: No focal ventilation defect. The ventilated portion of  the right lung is smaller than the left, corresponding with the chest radiograph abnormality. There is generalized decreased ventilation to the right midlung. Perfusion: No wedge shaped peripheral perfusion defects to suggest acute pulmonary embolism. IMPRESSION: Low probability for acute pulmonary embolus. Electronically Signed   By: Kerby Moors M.D.   On: 06/16/2015 15:16    I have reviewed the patient's current medications.  Assessment/Plan: 1 ESRD Uremia from not dialyzing for prolonged period.  Failed PD due to nonadherence. Will need to do HD and get cath, perm access.  Discussed with he and sister and agree. 2 Anemia ess 3 HTN lower vol .  Cont meds 4 GIB per GI 5 DM 6 NONADHERENCE primary issue P PD today and d/c.  PC and perm access. HD. ESA.    LOS: 2 days   George [name] L 06/18/2015,7:37 AM

## 2015-06-18 NOTE — Progress Notes (Signed)
  Echocardiogram 2D Echocardiogram has been performed.  George [name] 06/18/2015, 8:55 AM

## 2015-06-19 ENCOUNTER — Other Ambulatory Visit: Payer: Self-pay | Admitting: *Deleted

## 2015-06-19 ENCOUNTER — Inpatient Hospital Stay (HOSPITAL_COMMUNITY): Payer: Medicare Other | Admitting: Anesthesiology

## 2015-06-19 ENCOUNTER — Encounter (HOSPITAL_COMMUNITY)
Admission: EM | Disposition: A | Discharge status: Home / Self Care | Payer: Self-pay | Source: Home / Self Care | Attending: Internal Medicine

## 2015-06-19 ENCOUNTER — Encounter (HOSPITAL_COMMUNITY): Payer: Self-pay | Admitting: General Surgery

## 2015-06-19 ENCOUNTER — Inpatient Hospital Stay (HOSPITAL_COMMUNITY): Payer: Medicare Other

## 2015-06-19 DIAGNOSIS — K921 Melena: Secondary | ICD-10-CM

## 2015-06-19 DIAGNOSIS — Z4931 Encounter for adequacy testing for hemodialysis: Secondary | ICD-10-CM

## 2015-06-19 DIAGNOSIS — Z992 Dependence on renal dialysis: Secondary | ICD-10-CM

## 2015-06-19 DIAGNOSIS — N186 End stage renal disease: Secondary | ICD-10-CM

## 2015-06-19 DIAGNOSIS — N185 Chronic kidney disease, stage 5: Secondary | ICD-10-CM

## 2015-06-19 DIAGNOSIS — M79604 Pain in right leg: Secondary | ICD-10-CM

## 2015-06-19 HISTORY — PX: INSERTION OF DIALYSIS CATHETER: SHX1324

## 2015-06-19 HISTORY — PX: AV FISTULA PLACEMENT: SHX1204

## 2015-06-19 LAB — RENAL FUNCTION PANEL
ALBUMIN: 2.7 g/dL — AB (ref 3.5–5.0)
Albumin: 2.7 g/dL — ABNORMAL LOW (ref 3.5–5.0)
Anion gap: 16 — ABNORMAL HIGH (ref 5–15)
Anion gap: 15 (ref 5–15)
BUN: 134 mg/dL — ABNORMAL HIGH (ref 6–20)
BUN: 113 mg/dL — AB (ref 6–20)
CALCIUM: 8.2 mg/dL — AB (ref 8.9–10.3)
CHLORIDE: 104 mmol/L (ref 101–111)
CO2: 22 mmol/L (ref 22–32)
CO2: 24 mmol/L (ref 22–32)
CREATININE: 22.2 mg/dL — AB (ref 0.61–1.24)
CREATININE: 20.64 mg/dL — AB (ref 0.61–1.24)
Calcium: 8.5 mg/dL — ABNORMAL LOW (ref 8.9–10.3)
Chloride: 105 mmol/L (ref 101–111)
GFR calc non Af Amer: 2 mL/min — ABNORMAL LOW (ref >60)
GFR, EST AFRICAN AMERICAN: 2 mL/min — AB (ref >60)
GFR, EST AFRICAN AMERICAN: 3 mL/min — AB (ref >60)
GFR, EST NON AFRICAN AMERICAN: 2 mL/min — AB (ref >60)
Glucose, Bld: 223 mg/dL — ABNORMAL HIGH (ref 65–99)
Glucose, Bld: 93 mg/dL (ref 65–99)
PHOSPHORUS: 8.7 mg/dL — AB (ref 2.5–4.6)
POTASSIUM: 3.9 mmol/L (ref 3.5–5.1)
Phosphorus: 7.6 mg/dL — ABNORMAL HIGH (ref 2.5–4.6)
Potassium: 4.5 mmol/L (ref 3.5–5.1)
Sodium: 142 mmol/L (ref 135–145)
Sodium: 144 mmol/L (ref 135–145)

## 2015-06-19 LAB — CBC
HCT: 21.2 % — ABNORMAL LOW (ref 39.0–52.0)
HCT: 24.8 % — ABNORMAL LOW (ref 39.0–52.0)
HEMATOCRIT: 22.4 % — AB (ref 39.0–52.0)
HEMOGLOBIN: 8.6 g/dL — AB (ref 13.0–17.0)
Hemoglobin: 7.5 g/dL — ABNORMAL LOW (ref 13.0–17.0)
Hemoglobin: 7.3 g/dL — ABNORMAL LOW (ref 13.0–17.0)
MCH: 29 pg (ref 26.0–34.0)
MCH: 30.4 pg (ref 26.0–34.0)
MCH: 30.6 pg (ref 26.0–34.0)
MCHC: 33.5 g/dL (ref 30.0–36.0)
MCHC: 34.4 g/dL (ref 30.0–36.0)
MCHC: 34.7 g/dL (ref 30.0–36.0)
MCV: 86.5 fL (ref 78.0–100.0)
MCV: 88.3 fL (ref 78.0–100.0)
MCV: 88.3 fL (ref 78.0–100.0)
PLATELETS: 171 10*3/uL (ref 150–400)
PLATELETS: 169 10*3/uL (ref 150–400)
Platelets: 191 10*3/uL (ref 150–400)
RBC: 2.59 MIL/uL — AB (ref 4.22–5.81)
RBC: 2.4 MIL/uL — AB (ref 4.22–5.81)
RBC: 2.81 MIL/uL — AB (ref 4.22–5.81)
RDW: 15.6 % — ABNORMAL HIGH (ref 11.5–15.5)
RDW: 15.8 % — AB (ref 11.5–15.5)
RDW: 15.8 % — ABNORMAL HIGH (ref 11.5–15.5)
WBC: 10.2 10*3/uL (ref 4.0–10.5)
WBC: 11.2 10*3/uL — AB (ref 4.0–10.5)
WBC: 13 10*3/uL — AB (ref 4.0–10.5)

## 2015-06-19 LAB — GLUCOSE, CAPILLARY
GLUCOSE-CAPILLARY: 195 mg/dL — AB (ref 65–99)
Glucose-Capillary: 189 mg/dL — ABNORMAL HIGH (ref 65–99)
Glucose-Capillary: 179 mg/dL — ABNORMAL HIGH (ref 65–99)
Glucose-Capillary: 168 mg/dL — ABNORMAL HIGH (ref 65–99)
Glucose-Capillary: 101 mg/dL — ABNORMAL HIGH (ref 65–99)

## 2015-06-19 LAB — PREPARE RBC (CROSSMATCH)

## 2015-06-19 LAB — HEPATITIS B SURFACE ANTIBODY,QUALITATIVE: HEP B S AB: REACTIVE

## 2015-06-19 LAB — OCCULT BLOOD X 1 CARD TO LAB, STOOL: FECAL OCCULT BLD: POSITIVE — AB

## 2015-06-19 LAB — HEPATITIS B SURFACE ANTIGEN: Hepatitis B Surface Ag: NEGATIVE

## 2015-06-19 LAB — HEPATITIS B CORE ANTIBODY, IGM: HEP B C IGM: NEGATIVE

## 2015-06-19 SURGERY — INSERTION OF DIALYSIS CATHETER
Anesthesia: General | Site: Chest | Laterality: Left

## 2015-06-19 MED ORDER — SODIUM CHLORIDE 0.9 % IV SOLN: 100.0000 mL | INTRAVENOUS | Status: DC | PRN

## 2015-06-19 MED ORDER — HEPARIN SODIUM (PORCINE) 1000 UNIT/ML DIALYSIS: 1000.0000 [IU] | INTRAMUSCULAR | Status: DC | PRN

## 2015-06-19 MED ORDER — SODIUM CHLORIDE 0.9 % IV SOLN
25.0000 mg | Freq: Four times a day (QID) | INTRAVENOUS | Status: DC | PRN
  Administered 2015-06-20 (×2): 25 mg via INTRAVENOUS
  Filled 2015-06-19 (×6): qty 1

## 2015-06-19 MED ORDER — METOCLOPRAMIDE HCL 5 MG/ML IJ SOLN
10.0000 mg | Freq: Once | INTRAMUSCULAR | Status: AC
  Administered 2015-06-19: 10 mg via INTRAVENOUS
  Filled 2015-06-19: qty 2

## 2015-06-19 MED ORDER — MIDAZOLAM HCL 2 MG/2ML IJ SOLN
INTRAMUSCULAR | Status: AC
  Filled 2015-06-19: qty 2

## 2015-06-19 MED ORDER — HEPARIN SODIUM (PORCINE) 1000 UNIT/ML IJ SOLN
INTRAMUSCULAR | Status: AC
  Filled 2015-06-19: qty 1

## 2015-06-19 MED ORDER — LIDOCAINE HCL (PF) 1 % IJ SOLN: 5.0000 mL | INTRAMUSCULAR | Status: DC | PRN

## 2015-06-19 MED ORDER — AMLODIPINE BESYLATE 5 MG PO TABS
2.5000 mg | ORAL_TABLET | Freq: Every day | ORAL | Status: DC
  Administered 2015-06-19: 2.5 mg via ORAL
  Filled 2015-06-19: qty 1

## 2015-06-19 MED ORDER — HEPARIN SODIUM (PORCINE) 1000 UNIT/ML IJ SOLN
INTRAMUSCULAR | Status: DC | PRN
  Administered 2015-06-19: 5000 [IU] via INTRAVENOUS

## 2015-06-19 MED ORDER — LIDOCAINE HCL (CARDIAC) 20 MG/ML IV SOLN
INTRAVENOUS | Status: DC | PRN
  Administered 2015-06-19: 75 mg via INTRAVENOUS
  Administered 2015-06-19: 25 mg via INTRAVENOUS

## 2015-06-19 MED ORDER — THROMBIN 20000 UNITS EX SOLR
CUTANEOUS | Status: AC
  Filled 2015-06-19: qty 20000

## 2015-06-19 MED ORDER — SODIUM CHLORIDE 0.9 % IV SOLN: Freq: Once | INTRAVENOUS | Status: DC

## 2015-06-19 MED ORDER — FENTANYL CITRATE (PF) 250 MCG/5ML IJ SOLN
INTRAMUSCULAR | Status: AC
  Filled 2015-06-19: qty 5

## 2015-06-19 MED ORDER — PHENYLEPHRINE HCL 10 MG/ML IJ SOLN
INTRAMUSCULAR | Status: DC | PRN
  Administered 2015-06-19: 120 ug via INTRAVENOUS

## 2015-06-19 MED ORDER — LIDOCAINE HCL (PF) 1 % IJ SOLN: INTRAMUSCULAR | Status: DC | PRN

## 2015-06-19 MED ORDER — SODIUM CHLORIDE 0.9 % IV SOLN
100.0000 mL | INTRAVENOUS | Status: DC | PRN
Start: 2015-06-19 — End: 2015-06-19

## 2015-06-19 MED ORDER — DIPHENHYDRAMINE HCL 25 MG PO CAPS: 25.0000 mg | ORAL_CAPSULE | Freq: Once | ORAL | Status: DC

## 2015-06-19 MED ORDER — PROPOFOL 10 MG/ML IV BOLUS
INTRAVENOUS | Status: DC | PRN
  Administered 2015-06-19: 110 mg via INTRAVENOUS

## 2015-06-19 MED ORDER — PHENYLEPHRINE HCL 10 MG/ML IJ SOLN
10.0000 mg | INTRAVENOUS | Status: DC | PRN
  Administered 2015-06-19: 25 ug/min via INTRAVENOUS

## 2015-06-19 MED ORDER — CARVEDILOL 25 MG PO TABS
25.0000 mg | ORAL_TABLET | Freq: Two times a day (BID) | ORAL | Status: DC
Start: 2015-06-19 — End: 2015-06-20

## 2015-06-19 MED ORDER — ALTEPLASE 2 MG IJ SOLR: 2.0000 mg | Freq: Once | INTRAMUSCULAR | Status: DC | PRN

## 2015-06-19 MED ORDER — FENTANYL CITRATE (PF) 100 MCG/2ML IJ SOLN
INTRAMUSCULAR | Status: DC | PRN
  Administered 2015-06-19 (×4): 50 ug via INTRAVENOUS

## 2015-06-19 MED ORDER — LIDOCAINE-PRILOCAINE 2.5-2.5 % EX CREA: 1.0000 "application " | TOPICAL_CREAM | CUTANEOUS | Status: DC | PRN

## 2015-06-19 MED ORDER — HEPARIN SODIUM (PORCINE) 1000 UNIT/ML IJ SOLN
INTRAMUSCULAR | Status: DC | PRN
  Administered 2015-06-19: 3.8 mL

## 2015-06-19 MED ORDER — LIDOCAINE HCL (PF) 1 % IJ SOLN
INTRAMUSCULAR | Status: AC
  Filled 2015-06-19: qty 30

## 2015-06-19 MED ORDER — HEPARIN SODIUM (PORCINE) 1000 UNIT/ML DIALYSIS: 100.0000 [IU]/kg | INTRAMUSCULAR | Status: DC | PRN

## 2015-06-19 MED ORDER — PENTAFLUOROPROP-TETRAFLUOROETH EX AERO: 1.0000 "application " | INHALATION_SPRAY | CUTANEOUS | Status: DC | PRN

## 2015-06-19 MED ORDER — ONDANSETRON HCL 4 MG/2ML IJ SOLN
INTRAMUSCULAR | Status: DC | PRN
  Administered 2015-06-19: 4 mg via INTRAVENOUS

## 2015-06-19 MED ORDER — 0.9 % SODIUM CHLORIDE (POUR BTL) OPTIME
TOPICAL | Status: DC | PRN
  Administered 2015-06-19: 1000 mL

## 2015-06-19 MED ORDER — PHENYLEPHRINE 40 MCG/ML (10ML) SYRINGE FOR IV PUSH (FOR BLOOD PRESSURE SUPPORT)
PREFILLED_SYRINGE | INTRAVENOUS | Status: AC
  Filled 2015-06-19: qty 10

## 2015-06-19 MED ORDER — MIDAZOLAM HCL 5 MG/5ML IJ SOLN
INTRAMUSCULAR | Status: DC | PRN
  Administered 2015-06-19 (×2): 1 mg via INTRAVENOUS

## 2015-06-19 MED ORDER — SODIUM CHLORIDE 0.9 % IV SOLN
INTRAVENOUS | Status: DC | PRN
  Administered 2015-06-19: 500 mL

## 2015-06-19 MED ORDER — ACETAMINOPHEN 325 MG PO TABS: 650.0000 mg | ORAL_TABLET | Freq: Once | ORAL | Status: DC

## 2015-06-19 MED ORDER — HEPARIN SODIUM (PORCINE) 1000 UNIT/ML DIALYSIS: 40.0000 [IU]/kg | Freq: Once | INTRAMUSCULAR | Status: DC

## 2015-06-19 SURGICAL SUPPLY — 65 items
ARMBAND PINK RESTRICT EXTREMIT (MISCELLANEOUS) ×3 IMPLANT
BAG DECANTER FOR FLEXI CONT (MISCELLANEOUS) ×2 IMPLANT
BIOPATCH RED 1 DISK 7.0 (GAUZE/BANDAGES/DRESSINGS) ×3 IMPLANT
CANISTER SUCTION 2500CC (MISCELLANEOUS) ×3 IMPLANT
CANNULA VESSEL 3MM 2 BLNT TIP (CANNULA) ×3 IMPLANT
CATH CANNON HEMO 15F 50CM (CATHETERS) IMPLANT
CATH CANNON HEMO 15FR 19 (HEMODIALYSIS SUPPLIES) IMPLANT
CATH CANNON HEMO 15FR 23CM (HEMODIALYSIS SUPPLIES) IMPLANT
CATH CANNON HEMO 15FR 31CM (HEMODIALYSIS SUPPLIES) IMPLANT
CATH CANNON HEMO 15FR 32 (HEMODIALYSIS SUPPLIES) IMPLANT
CATH CANNON HEMO 15FR 32CM (HEMODIALYSIS SUPPLIES) IMPLANT
CATH PALINDROME REV 28CM (CATHETERS) ×1 IMPLANT
CATH STRAIGHT 5FR 65CM (CATHETERS) IMPLANT
CHLORAPREP W/TINT 26ML (MISCELLANEOUS) ×3 IMPLANT
CLIP TI MEDIUM 6 (CLIP) ×3 IMPLANT
CLIP TI WIDE RED SMALL 6 (CLIP) ×3 IMPLANT
COVER PROBE W GEL 5X96 (DRAPES) ×1 IMPLANT
DECANTER SPIKE VIAL GLASS SM (MISCELLANEOUS) ×3 IMPLANT
DRAIN PENROSE 1/4X12 LTX STRL (WOUND CARE) ×3 IMPLANT
DRAPE C-ARM 42X72 X-RAY (DRAPES) ×3 IMPLANT
DRAPE CHEST BREAST 15X10 FENES (DRAPES) ×3 IMPLANT
ELECT REM PT RETURN 9FT ADLT (ELECTROSURGICAL) ×3
ELECTRODE REM PT RTRN 9FT ADLT (ELECTROSURGICAL) ×2 IMPLANT
GAUZE SPONGE 2X2 8PLY STRL LF (GAUZE/BANDAGES/DRESSINGS) ×2 IMPLANT
GAUZE SPONGE 4X4 16PLY XRAY LF (GAUZE/BANDAGES/DRESSINGS) ×2 IMPLANT
GLOVE BIO SURGEON STRL SZ 6.5 (GLOVE) ×4 IMPLANT
GLOVE BIO SURGEON STRL SZ7.5 (GLOVE) ×4 IMPLANT
GLOVE BIOGEL PI IND STRL 6.5 (GLOVE) IMPLANT
GLOVE BIOGEL PI INDICATOR 6.5 (GLOVE) ×3
GLOVE SURG SS PI 6.5 STRL IVOR (GLOVE) ×1 IMPLANT
GLOVE SURG SS PI 7.0 STRL IVOR (GLOVE) ×1 IMPLANT
GOWN STRL REUS W/ TWL LRG LVL3 (GOWN DISPOSABLE) ×6 IMPLANT
GOWN STRL REUS W/ TWL XL LVL3 (GOWN DISPOSABLE) IMPLANT
GOWN STRL REUS W/TWL LRG LVL3 (GOWN DISPOSABLE) ×12
GOWN STRL REUS W/TWL XL LVL3 (GOWN DISPOSABLE) ×3
KIT BASIN OR (CUSTOM PROCEDURE TRAY) ×3 IMPLANT
KIT ROOM TURNOVER OR (KITS) ×3 IMPLANT
LIQUID BAND (GAUZE/BANDAGES/DRESSINGS) ×4 IMPLANT
LOOP VESSEL MINI RED (MISCELLANEOUS) IMPLANT
NDL 18GX1X1/2 (RX/OR ONLY) (NEEDLE) ×2 IMPLANT
NDL HYPO 25GX1X1/2 BEV (NEEDLE) ×2 IMPLANT
NEEDLE 18GX1X1/2 (RX/OR ONLY) (NEEDLE) ×3 IMPLANT
NEEDLE HYPO 25GX1X1/2 BEV (NEEDLE) ×3 IMPLANT
NS IRRIG 1000ML POUR BTL (IV SOLUTION) ×3 IMPLANT
PACK CV ACCESS (CUSTOM PROCEDURE TRAY) ×3 IMPLANT
PACK SURGICAL SETUP 50X90 (CUSTOM PROCEDURE TRAY) ×3 IMPLANT
PAD ARMBOARD 7.5X6 YLW CONV (MISCELLANEOUS) ×6 IMPLANT
SET MICROPUNCTURE 5F STIFF (MISCELLANEOUS) IMPLANT
SPONGE GAUZE 2X2 STER 10/PKG (GAUZE/BANDAGES/DRESSINGS) ×1
SPONGE SURGIFOAM ABS GEL 100 (HEMOSTASIS) IMPLANT
SUT ETHILON 3 0 PS 1 (SUTURE) ×3 IMPLANT
SUT PROLENE 7 0 BV 1 (SUTURE) ×3 IMPLANT
SUT SILK 3 0 (SUTURE) ×3
SUT SILK 3-0 18XBRD TIE 12 (SUTURE) IMPLANT
SUT VIC AB 3-0 SH 27 (SUTURE) ×3
SUT VIC AB 3-0 SH 27X BRD (SUTURE) ×2 IMPLANT
SUT VICRYL 4-0 PS2 18IN ABS (SUTURE) ×4 IMPLANT
SYR 20CC LL (SYRINGE) ×5 IMPLANT
SYR 5ML LL (SYRINGE) ×2 IMPLANT
SYR CONTROL 10ML LL (SYRINGE) ×2 IMPLANT
SYRINGE 10CC LL (SYRINGE) ×3 IMPLANT
TAPE CLOTH SURG 4X10 WHT LF (GAUZE/BANDAGES/DRESSINGS) ×1 IMPLANT
UNDERPAD 30X30 INCONTINENT (UNDERPADS AND DIAPERS) ×3 IMPLANT
WATER STERILE IRR 1000ML POUR (IV SOLUTION) ×3 IMPLANT
WIRE AMPLATZ SS-J .035X180CM (WIRE) IMPLANT

## 2015-06-19 NOTE — Discharge Instructions (Signed)
° ° °  06/19/2015 Ilan Kehres [id number] 1970-07-12  Surgeon(s): Elam Dutch, MD  Procedure(s): INSERTION OF DIALYSIS CATHETER LEFT INTERNAL JUGULAR LEFT ARTERIOVENOUS (AV) FISTULA CREATION  x Do not stick fistula for 12 weeks

## 2015-06-19 NOTE — Anesthesia Procedure Notes (Signed)
Procedure Name: LMA Insertion Date/Time: 06/19/2015 12:10 PM Performed by: Mariea Clonts Pre-anesthesia Checklist: Patient identified, Patient being monitored, Emergency Drugs available, Timeout performed and Suction available Patient Re-evaluated:Patient Re-evaluated prior to inductionOxygen Delivery Method: Circle system utilized Preoxygenation: Pre-oxygenation with 100% oxygen Intubation Type: IV induction LMA: LMA inserted LMA Size: 5.0 Placement Confirmation: breath sounds checked- equal and bilateral and positive ETCO2 Tube secured with: Tape Dental Injury: Teeth and Oropharynx as per pre-operative assessment

## 2015-06-19 NOTE — Progress Notes (Signed)
Patient with about an hour left of dwell 5 out of 5 on PD cycler. Patient to go to OR at 11:00 am for AVF placement/TDC placement so treatment is needing to be stopped early. Remainder of dwell bypassed, patient drained, and last fill bypassed. Capped off catheter per protocol. Exit site care performed. Cycler returned to [place] as patient is being transitioned to HD today.  Joellen Jersey, RN.

## 2015-06-19 NOTE — Progress Notes (Signed)
[facility] PROGRESS NOTE Subjective patient denies any pain or any bleeding. He still refuses to consider EGD. He apparently had family members that died following EGD. He wants to eat  Objective: Vital signs in last 24 hours: Temp:  [98 F (36.7 C)-98.7 F (37.1 C)] 98.7 F (37.1 C) (12/20 0800) Pulse Rate:  [82-102] 88 (12/20 0800) Resp:  [19-23] 19 (12/20 0800) BP: (119-169)/(85-106) 132/99 mmHg (12/20 0800) SpO2:  [97 %-100 %] 97 % (12/20 0800) Weight:  [91.6 kg (201 lb 15.1 oz)] 91.6 kg (201 lb 15.1 oz) (12/19 1400) Last BM Date: 06/18/15  Intake/Output from previous day: 12/19 0701 - 12/20 0700 In: [id number] [P.O.:250; I.V.:578] Out: -  Intake/Output this shift:    PE: General-- alert no distress  Abdomen-- obese but nontender  Lab Results:  Recent Labs  06/17/15 1011 06/17/15 2343 06/18/15 0810 06/18/15 1645 06/19/15 0310  WBC 8.0 11.1* 10.3  --  10.2  HGB 6.7* 8.0* 7.6* 7.8* 7.5*  HCT 19.9* 23.3* 21.3* 23.0* 22.4*  PLT 164 160 164  --  171   BMET  Recent Labs  06/17/15 0543 06/18/15 0810 06/19/15 0310  NA 140 142 142  K 5.1 4.3 3.9  CL 103 104 104  CO2 21* 21* 22  CREATININE 26.64* 22.64* 22.20*   LFT No results for input(s): PROT, AST, ALT, ALKPHOS, BILITOT, BILIDIR, IBILI in the last 72 hours. PT/INR No results for input(s): LABPROT, INR in the last 72 hours. PANCREAS No results for input(s): LIPASE in the last 72 hours.       Studies/Results: No results found.  Medications: I have reviewed the patient's current medications.  Assessment/Plan: 1. G.I. bleed with questionable history of hematemesis. Patient still currently refusing to consider EGD. In view of this, continue PPI therapy. We will be available if he changes his mind. Would reconsider if he continues to drop his hemoglobin.   Uriyah Massimo JR,Onesimo Lingard L 06/19/2015, 10:13 AM  Pager: [phone] If no answer or after hours call [phone]

## 2015-06-19 NOTE — Progress Notes (Signed)
[facility] PROGRESS NOTE  George [name] [id number] DOB: [birth year] DOA: 06/15/2015 PCP: No primary care provider on file.   brief interval history George [name] is a 44 y.o. male with a history of ESRD on Peritoneal dialysis, DM2, and HTN who presents to the ED with complaints of right sided chest pain and SOB. He was found to have hypoxemia in the ED to 85%. He was placed on NCO2 at 2 liters. He was referred for further workup.   patient also noted to have a 2-3 month history of anorexia or fatigue as well as nausea , cough.  Patient was supposed to be doing extra exchange in the evening but didn't do it because he was concerned that it will decrease his PD deficiency according to things told to him by his doctors before he left [place] about his PD.  Patient was admitted treated for uremia secondary to under dialysis with CCPD  Per renal. Patient also noted during the hospitalization to have coffee-ground emesis was placed on a Protonix drip and transfused 2 units packed red blood cells. GI was consulted who followed the patient however patient refused upper endoscopy.  Coffee-ground emesis  Improved and patient had no further episodes. Patient to be transitioned to hemodialysis per nephrology.   Assessment/Plan: #1 uremia/under dialysis/end-stage renal disease on peritoneal dialysis Patient presented with uremia, likely under dialysis as he has missed some peritoneal dialysis. Patient denies any chest pain. Patient denies any shortness of breath.Patient on peritoneal dialysis. Patient to have vein mapping and and plans for access placement and tunneled dialysis catheter placement this week per vascular surgery for transition to HD. Per nephrology.  #2 hematemesis/coffee-ground emesis Patient with coffee-ground emesis on 06/17/2015. Patient endorsed recent use of ibuprofen 800 mg 4 times daily for right leg pain. Patient denies daily chronic alcohol use. Per wife patient with prior  history of duodenal ulcer. Continue Protonix drip. Patient status post 2 units packed red blood cells hemoglobin currently at 7.5 from 7.6 from 6.7. Patient was seen in consultation by gastroenterology, Dr. Penelope Coop who had recommended an upper endoscopy however patient refused secondary to a history of death amongst family members after endoscopy. Repeat H&H this afternoon. Transfusion threshold hemoglobin less than 7. GI following and appreciate input and recommendations. If patient continues to refuse endoscopy by Wednesday, 06/20/2015 then will likely need to get upper GI series for further evaluation.  #3 hypertension Continue Norvasc, digoxin, BiDil, Cozaar.Change IV Lopressor to home dose beta blocker.  #4 well-controlled diabetes mellitus Patient on oral diabetic medications at home. Hemoglobin A1c = 5.4. Continue sliding scale insulin. Hold oral hypoglycemic agents. Follow.  #5 anemia/acute blood loss anemia Likely multifactorial secondary to acute blood loss anemia and anemia of chronic disease. Anemia panel with iron of 145 TIBC of 200 ferritin of 846 consistent with anemia of chronic disease likely secondary to end-stage renal disease on hemodialysis. Patient with coffee-ground emesis on 06/17/2015. Patient status post 2 units packed red blood cells hemoglobin currently at 7.5 from 7.6 from 6.7. Continue Protonix drip. Transfusion threshold hemoglobin less than 7. GI following. Follow.  #6 elevated troponins Patient denies any acute chest pain. Troponins trending down..If acute coronary syndrome may be secondary to problem #1. 2-D echo with EF of 60-65% with severe LVH, grade 1 diastolic dysfunction. Severe nodular posterior annular calcification on the mitral valve. Continue beta blocker, ARB, BiDil, digoxin. Follow.  #7 shortness of breath Likely secondary to hypervolemia and under hemodialysis. VQ scan negative for PE. Lower extremity  Dopplers pending. Patient on CPPD per renal to be  converted to HD.  #8 prophylaxis PPI for GI prophylaxis. SCDs for DVT prophylaxis.   Code Status: Full Family Communication: Updated patient and wife at bedside. Disposition Plan: Transfer to [facility],   Consultants:  Nephrology: Dr. Jonnie Finner 06/16/2015  Gastroenterology: Dr. Penelope Coop 06/17/2015  Procedures:  V/Q scan 06/16/2015  CXR 06/16/2015  2-D echo 06/18/2015  2 units packed red blood cells 06/17/2015  Antibiotics:  None  HPI/Subjective: Patient denies any further coffee-ground emesis. Patient denies chest pain. No shortness of breath. No leg pain.  Objective: Filed Vitals:   06/19/15 0529 06/19/15 0800  BP: 119/85 132/99  Pulse:  88  Temp:  98.7 F (37.1 C)  Resp:  19    Intake/Output Summary (Last 24 hours) at 06/19/15 1003 Last data filed at 06/19/15 0600  Gross per 24 hour  Intake    753 ml  Output      0 ml  Net    753 ml   Filed Weights   06/15/15 2007 06/16/15 1509 06/18/15 1400  Weight: 91.627 kg (202 lb) 94.8 kg (208 lb 15.9 oz) 91.6 kg (201 lb 15.1 oz)    Exam:   General:  Sitting up on side of bed.  Cardiovascular: RRR  Respiratory: CTAB  Abdomen: Soft, nontender, nondistended, positive bowel sounds.  Musculoskeletal: No clubbing cyanosis or edema.  Data Reviewed: Basic Metabolic Panel:  Recent Labs Lab 06/15/15 2021 06/16/15 0634 06/17/15 0543 06/18/15 0810 06/19/15 0310  NA 140 140 140 142 142  K 5.5* 5.7* 5.1 4.3 3.9  CL 105 105 103 104 104  CO2 18* 18* 21* 21* 22  GLUCOSE 109* 117* 111* 147* 223*  BUN 149* 151* 149* 143* 134*  CREATININE 29.20* 29.67* 26.64* 22.64* 22.20*  CALCIUM 8.1* 7.7* 7.6* 8.4* 8.5*  PHOS  --   --  8.8* 8.0* 7.6*   Liver Function Tests:  Recent Labs Lab 06/17/15 0543 06/18/15 0810 06/19/15 0310  ALBUMIN 2.5* 2.7* 2.7*   No results for input(s): LIPASE, AMYLASE in the last 168 hours. No results for input(s): AMMONIA in the last 168 hours. CBC:  Recent Labs Lab 06/16/15 0634  06/17/15 1011 06/17/15 2343 06/18/15 0810 06/18/15 1645 06/19/15 0310  WBC 8.8 8.0 11.1* 10.3  --  10.2  HGB 7.0* 6.7* 8.0* 7.6* 7.8* 7.5*  HCT 20.4* 19.9* 23.3* 21.3* 23.0* 22.4*  MCV 86.1 86.5 86.0 83.5  --  86.5  PLT 158 164 160 164  --  171   Cardiac Enzymes:  Recent Labs Lab 06/16/15 1352 06/16/15 1910 06/17/15 0543 06/17/15 1211 06/17/15 2343  TROPONINI 0.10* 0.07* 0.08* 0.08* 0.10*   BNP (last 3 results) No results for input(s): BNP in the last 8760 hours.  ProBNP (last 3 results) No results for input(s): PROBNP in the last 8760 hours.  CBG:  Recent Labs Lab 06/18/15 1744 06/18/15 2110 06/19/15 0110 06/19/15 0436 06/19/15 0819  GLUCAP 90 105* 189* 179* 195*    Recent Results (from the past 240 hour(s))  MRSA PCR Screening     Status: None   Collection Time: 06/16/15  3:47 PM  Result Value Ref Range Status   MRSA by PCR NEGATIVE NEGATIVE Final    Comment:        The GeneXpert MRSA Assay (FDA approved for NASAL specimens only), is one component of a comprehensive MRSA colonization surveillance program. It is not intended to diagnose MRSA infection nor to guide or monitor treatment for MRSA  infections.      Studies: No results found.  Scheduled Meds: . sodium chloride   Intravenous Once  . acetaminophen  650 mg Oral Once  . amLODipine  2.5 mg Oral QHS  . cefUROXime (ZINACEF)  IV  1.5 g Intravenous To SS-Surg  . [START ON 06/25/2015] darbepoetin (ARANESP) injection - DIALYSIS  200 mcg Intravenous Q Mon-HD  . digoxin  0.125 mg Oral QODAY  . diphenhydrAMINE  25 mg Oral Once  . gentamicin cream  1 application Topical Daily  . insulin aspart  0-9 Units Subcutaneous 6 times per day  . isosorbide-hydrALAZINE  1 tablet Oral TID  . lanthanum  1,000 mg Oral TID WC  . metoprolol  7.5 mg Intravenous 3 times per day  . multivitamin  1 tablet Oral Daily  . [START ON 06/20/2015] pantoprazole (PROTONIX) IV  40 mg Intravenous Q12H  . pravastatin  20 mg  Oral QPM  . sodium chloride  3 mL Intravenous Q12H   Continuous Infusions: . pantoprozole (PROTONIX) infusion 8 mg/hr (06/19/15 0435)    Principal Problem:   Uremia Active Problems:   ESRD on peritoneal dialysis ([place])   Hematemesis   Hypoxia   Acute respiratory failure ([place])   Diabetes mellitus without complication ([place])   Essential hypertension   Pleuritic chest pain   SOB (shortness of breath)   Hematemesis with nausea    Time spent: [place] MD [facility] Pager [phone]. If 7PM-7AM, please contact night-coverage at [email or web address], password [facility] 06/19/2015, 10:03 AM  LOS: 3 days

## 2015-06-19 NOTE — Transfer of Care (Signed)
Immediate Anesthesia Transfer of Care Note  Patient: George [name]  Procedure(s) Performed: Procedure(s): INSERTION OF DIALYSIS CATHETER LEFT INTERNAL JUGULAR (Left) LEFT ARTERIOVENOUS (AV) FISTULA CREATION (Left)  Patient Location: PACU  Anesthesia Type:General  Level of Consciousness: sedated  Airway & Oxygen Therapy: Patient Spontanous Breathing and Patient connected to nasal cannula oxygen  Post-op Assessment: Report given to RN and Post -op Vital signs reviewed and stable  Post vital signs: Reviewed and stable  Last Vitals:  Filed Vitals:   06/19/15 0900 06/19/15 1100  BP: 125/90   Pulse:    Temp:    Resp: 18 20    Complications: No apparent anesthesia complications

## 2015-06-19 NOTE — Progress Notes (Signed)
Pt received one unit of PRBCs in hemodialysis; tolerated well; no s/s of a rxn noted; Floor RN made aware that pt another unit of blood.

## 2015-06-19 NOTE — Progress Notes (Signed)
[facility] PROGRESS NOTE Subjective no further gross bleeding. Patient still refusing EGD  Objective: Vital signs in last 24 hours: Temp:  [98 F (36.7 C)-98.7 F (37.1 C)] 98.7 F (37.1 C) (12/20 0800) Pulse Rate:  [82-102] 88 (12/20 0800) Resp:  [19-23] 19 (12/20 0800) BP: (119-169)/(85-106) 132/99 mmHg (12/20 0800) SpO2:  [97 %-100 %] 97 % (12/20 0800) Weight:  [91.6 kg (201 lb 15.1 oz)] 91.6 kg (201 lb 15.1 oz) (12/19 1400) Last BM Date: 06/18/15  Intake/Output from previous day: 12/19 0701 - 12/20 0700 In: [id number] [P.O.:250; I.V.:578] Out: -  Intake/Output this shift:    PE:  Abdomen-- soft and nontender  Lab Results:  Recent Labs  06/17/15 1011 06/17/15 2343 06/18/15 0810 06/18/15 1645 06/19/15 0310  WBC 8.0 11.1* 10.3  --  10.2  HGB 6.7* 8.0* 7.6* 7.8* 7.5*  HCT 19.9* 23.3* 21.3* 23.0* 22.4*  PLT 164 160 164  --  171   BMET  Recent Labs  06/17/15 0543 06/18/15 0810 06/19/15 0310  NA 140 142 142  K 5.1 4.3 3.9  CL 103 104 104  CO2 21* 21* 22  CREATININE 26.64* 22.64* 22.20*   LFT No results for input(s): PROT, AST, ALT, ALKPHOS, BILITOT, BILIDIR, IBILI in the last 72 hours. PT/INR No results for input(s): LABPROT, INR in the last 72 hours. PANCREAS No results for input(s): LIPASE in the last 72 hours.       Studies/Results: No results found.  Medications: I have reviewed the patient's current medications.  Assessment/Plan: 1. G.I. bleeding with questionable hematemesis. Patient is currently refusing EGD. In view of this would continue him on PPI therapy. If he has further bleeding or changes his mind please feel free to contact us.   Willie Loy JR,Gilberta Peeters L 06/19/2015, 10:18 AM  Pager: [phone] If no answer or after hours call [phone]

## 2015-06-19 NOTE — Interval H&P Note (Signed)
History and Physical Interval Note:  06/19/2015 11:27 AM  George [name]  has presented today for surgery, with the diagnosis of End Stage Renal Disease N18.6  The various methods of treatment have been discussed with the patient and family. After consideration of risks, benefits and other options for treatment, the patient has consented to  Procedure(s): ARTERIOVENOUS (AV) FISTULA CREATION (Left) INSERTION OF DIALYSIS CATHETER (N/A) as a surgical intervention .  The patient's history has been reviewed, patient examined, no change in status, stable for surgery.  I have reviewed the patient's chart and labs.  Questions were answered to the patient's satisfaction.     Ruta Hinds

## 2015-06-19 NOTE — Care Management Important Message (Signed)
Important Message  Patient Details  Name: George [name] MRN: [id number] Date of Birth: 23-Mar-1971   Medicare Important Message Given:  Yes    [name] 06/19/2015, 5:10 PM

## 2015-06-19 NOTE — Progress Notes (Signed)
Subjective: Interval History: has no complaint .  Objective: Vital signs in last 24 hours: Temp:  [98 F (36.7 C)-98.4 F (36.9 C)] 98.4 F (36.9 C) (12/20 0049) Pulse Rate:  [82-102] 95 (12/20 0500) Resp:  [19-23] 20 (12/20 0500) BP: (119-173)/(85-106) 119/85 mmHg (12/20 0529) SpO2:  [97 %-100 %] 99 % (12/20 0500) Weight:  [91.6 kg (201 lb 15.1 oz)] 91.6 kg (201 lb 15.1 oz) (12/19 1400) Weight change:   Intake/Output from previous day: 12/19 0701 - 12/20 0700 In: 828 [P.O.:250; I.V.:578] Out: -  Intake/Output this shift:    General appearance: cooperative and slowed mentation Resp: diminished breath sounds bilaterally and wheezes bilaterally Cardio: S1, S2 normal and systolic murmur: holosystolic 2/6, blowing at apex GI: PD cath , pos bs, soft Extremities: extremities normal, atraumatic, no cyanosis or edema  Lab Results:  Recent Labs  06/18/15 0810 06/18/15 1645 06/19/15 0310  WBC 10.3  --  10.2  HGB 7.6* 7.8* 7.5*  HCT 21.3* 23.0* 22.4*  PLT 164  --  171   BMET:  Recent Labs  06/18/15 0810 06/19/15 0310  NA 142 142  K 4.3 3.9  CL 104 104  CO2 21* 22  GLUCOSE 147* 223*  BUN 143* 134*  CREATININE 22.64* 22.20*  CALCIUM 8.4* 8.5*   No results for input(s): PTH in the last 72 hours. Iron Studies:  Recent Labs  06/16/15 1352  IRON 145  TIBC 200*  FERRITIN 846*    Studies/Results: No results found.  I have reviewed the patient's current medications.  Assessment/Plan: 1 ESRD PD ok . Clearance not optimal but chem improving.  Will convert to HD.  For AVF and cath today.  Will do HD after.  Plan to get PD cath out 2 Anemia on ESA 3 HTN lower with lower vol and solute 4 Polysubstance abuse 5 NONadherence 6 HPTH check  7 GIB per GI and primary P HD, esa, cath/avf.  Stop PD, get cath out   LOS: 3 days   Inocencia Murtaugh L 06/19/2015,7:30 AM

## 2015-06-19 NOTE — Progress Notes (Signed)
06/19/2015 11:18 AM Hemodialysis Note; per physician request we have begun looking for an outpatient hemodialysis schedule, currently we are looking at the [facility] location. I will ask the social worker to help patient obtain an application for [facility] services for transportation. Thank you. Gordy Savers

## 2015-06-19 NOTE — Progress Notes (Addendum)
Was called by HD nurse, patient with large melanotic BM during HD. SBP in 80-90s. Went and assessed patient. Patient alert ff commands. Patient still refusing upper endoscopy when asked again. Will get stat CBC. Transfuse 2 units PRBCs. Continue PPT gtt. Keep NPO. Cancel transfer and patient to remain in SDU. Serial CBC. Will reconsult with GI again. Tried to call family, however no answer. Spoke with Dr Penelope Coop, [facility] and agree with current transfusion and protonix gtt. Gi to reasses in morning. If patient changes his mind likely EGD. If patient continues bleeding and becomes unstable tonight will need to have GI see again tonight for possible emergent EGD.

## 2015-06-19 NOTE — Progress Notes (Signed)
Came in and spoke to patient and family. Patient had 2 more melanotic BM this evening. Patient has changed his mind and is in agreement to get endoscopy tomorrow for further evaluation of his GIBleed.

## 2015-06-19 NOTE — Op Note (Signed)
Procedure: #1 ultrasound neck #2 insertion of 28 cm Palindrome catheter (left internal jugular vein) #3 left brachial cephalic AV fistula  Preoperative diagnosis: ESRD Postoperative diagnosis: Same  Anesthesia: Gen.  Assistant: Leontine Locket PA-C  Operative findings: #1 3.5 mm cephalic vein          #2 28 cm Palindrome catheter left internal jugular vein  Operative details: After obtaining informed consent, the patient was taken to the operating room. The patient was placed in supine position on the operating room table. After administration of general anesthesia, the patient's entire neck and chest were prepped and draped in usual sterile fashion. The patient was placed in Trendelenburg position. Ultrasound showed the right internal jugular vein to be small.  The left internal jugular vein was of much better quality.  Ultrasound was used to identify the patient's left internal jugular vein. This had normal compressibility and respiratory variation.Using ultrasound guidance, the left internal jugular vein was successfully cannulated.  A 0.035 J-tipped guidewire was threaded into the left internal jugular vein and into the superior vena cava followed by the inferior vena cava under fluoroscopic guidance.   Next sequential 12 and 14 dilators were placed over the guidewire into the right atrium.  A 16 French dilator with a peel-away sheath was then placed over the guidewire into the right atrium.   The guidewire and dilator were removed. A 28 cm Palindrome catheter was then placed through the peel away sheath into the right atrium.  The catheter was then tunneled subcutaneously, cut to length, and the hub attached. The catheter was noted to flush and draw easily. The catheter was inspected under fluoroscopy and found with its tip to be in the right atrium without any kinks throughout its course. The catheter was sutured to the skin with nylon sutures. The neck insertion site was closed with Vicryl stitch.  The catheter was then loaded with concentrated Heparin solution. A dry sterile dressing was applied.  Next attention was turned to the left upper extremity. The left upper extremity was prepped and draped in usual sterile fashion.  A transverse incision was then made near the antecubital crease the left arm. The incision was carried into the subcutaneous tissues down to level of the cephalic vein. The cephalic vein was approximately 3.5 mm in diameter. It was of good quality. This was dissected free circumferentially and small side branches ligated and divided between silk ties or clips. Next the brachial artery was dissected free in the medial portion of the incision. The artery was  3-4 mm in diameter. The vessel loops were placed proximal and distal to the planned site of arteriotomy. The patient was given 5000 units of intravenous heparin. After appropriate circulation time, the vessel loops were used to control the artery. A longitudinal opening was made in the brachial artery.  The vein was ligated distally with a 2-0 silk tie. The vein was controlled proximally with a fine bulldog clamp. The vein was then swung over to the artery and sewn end of vein to side of artery using a running 7-0 Prolene suture. Just prior to completion of the anastomosis, everything was fore bled back bled and thoroughly flushed. The anastomosis was secured, vessel loops released, and there was a palpable thrill in the fistula immediately. After hemostasis was obtained, the subcutaneous tissues were reapproximated using a running 3-0 Vicryl suture. The skin was then closed with a 4 0 Vicryl subcuticular stitch. Dermabond was applied to the skin incision.    The  patient tolerated procedure well and there were no complications. Instrument sponge and needle counts were correct at the end of the case. The patient was taken to the recovery room in stable condition. Chest x-ray will be obtained in the recovery room.  Ruta Hinds,  MD [facility] Office: [phone] Pager: [phone]

## 2015-06-19 NOTE — H&P (View-Only) (Signed)
[facility] [place]  Patient name: George [name] MRN: [id number] DOB: 05-Mar-1971 Sex: male  REASON FOR CONSULT: permanent dialysis access and tunneled dialysis catheter placement   HPI: George [name] is a 44 y.o. male who has a history of ESRD on peritoneal dialysis (x 1 years) who presented to the ED on 06/16/15 with complaints of shortness of breath and nausea. The patient's significant other reports that the patient has been noncompliant with PD.  He has never had access procedures before. He has had a previous tunneled dialysis catheter. The patient is right handed. He does not have a pacemaker.   He did have some coffee ground emesis during this admission associated with anemia. GI was consulted and recommended EGD, blood transfusion and pantoprazole drip. The patient has refused all of these recommendations however.   He has past medical history of diabetes managed on oral hypoglycemics. He has hypertension managed on multiple medications. He has hyperlipidemia managed on a statin.   He currently denies any chest discomfort, shortness of breath and nausea. Full ROS below.   Past Medical History  Diagnosis Date  . Diabetes mellitus without complication ([place])   . Hypertension   . Renal disorder     No family history on file.  SOCIAL HISTORY: Social History   Social History  . Marital Status: Single    Spouse Name: N/A  . Number of Children: N/A  . Years of Education: N/A   Occupational History  . Not on file.   Social History Main Topics  . Smoking status: Current Every Day Smoker    Types: Cigarettes  . Smokeless tobacco: Not on file  . Alcohol Use: No  . Drug Use: Yes    Special: Marijuana  . Sexual Activity: Not on file   Other Topics Concern  . Not on file   Social History Narrative    No Known Allergies  Current Facility-Administered Medications  Medication Dose Route Frequency Provider Last Rate Last Dose  . 0.9 %  sodium chloride  infusion  250 mL Intravenous PRN Theressa Millard, MD      . 0.9 %  sodium chloride infusion   Intravenous Once Irine Seal V, MD      . acetaminophen (TYLENOL) tablet 650 mg  650 mg Oral Q6H PRN Theressa Millard, MD       Or  . acetaminophen (TYLENOL) suppository 650 mg  650 mg Rectal Q6H PRN Theressa Millard, MD      . acetaminophen (TYLENOL) tablet 650 mg  650 mg Oral Q6H PRN Eugenie Filler, MD       Or  . acetaminophen (TYLENOL) suppository 650 mg  650 mg Rectal Q6H PRN Eugenie Filler, MD      . acetaminophen (TYLENOL) tablet 650 mg  650 mg Oral Once Eugenie Filler, MD   650 mg at 06/17/15 1428  . amLODipine (NORVASC) tablet 10 mg  10 mg Oral QHS Mauricia Area, MD      . calcium carbonate (dosed in mg elemental calcium) suspension 500 mg of elemental calcium  500 mg of elemental calcium Oral Q6H PRN Eugenie Filler, MD      . camphor-menthol [facility]) lotion 1 application  1 application Topical [id number] PRN Eugenie Filler, MD       And  . hydrOXYzine (ATARAX/VISTARIL) tablet 25 mg  25 mg Oral Q8H PRN Eugenie Filler, MD      . Darbepoetin Alfa (ARANESP) injection 200  mcg  200 mcg Intravenous Q Mon-HD Mauricia Area, MD      . dialysis solution 4.25% low-MG/low-CA dianeal solution   Intraperitoneal Q24H Roney Jaffe, MD   5,000 mL at 06/17/15 2233  . digoxin (LANOXIN) tablet 0.125 mg  0.125 mg Oral QODAY Theressa Millard, MD   0.125 mg at 06/17/15 1000  . diphenhydrAMINE (BENADRYL) capsule 25 mg  25 mg Oral Once Eugenie Filler, MD   25 mg at 06/17/15 1427  . docusate sodium (ENEMEEZ) enema 283 mg  1 enema Rectal PRN Eugenie Filler, MD      . feeding supplement (NEPRO CARB STEADY) liquid 237 mL  237 mL Oral TID PRN Eugenie Filler, MD      . gentamicin cream (GARAMYCIN) 0.1 % 1 application  1 application Topical Daily Donato Heinz, MD   Stopped at 06/16/15 1305  . heparin 1000 unit/ml injection 2,500 Units  2,500 Units Intraperitoneal PRN Donato Heinz, MD      . hydrALAZINE (APRESOLINE) injection 10 mg  10 mg Intravenous Q6H PRN Eugenie Filler, MD   10 mg at 06/17/15 1901  . HYDROmorphone (DILAUDID) injection 0.5-1 mg  0.5-1 mg Intravenous Q3H PRN Theressa Millard, MD      . insulin aspart (novoLOG) injection 0-9 Units  0-9 Units Subcutaneous 6 times per day Theressa Millard, MD   0 Units at 06/16/15 0400  . isosorbide-hydrALAZINE (BIDIL) 20-37.5 MG per tablet 1 tablet  1 tablet Oral TID Eugenie Filler, MD   1 tablet at 06/17/15 2246  . lanthanum (FOSRENOL) chewable tablet 1,000 mg  1,000 mg Oral TID WC Eugenie Filler, MD   1,000 mg at 06/17/15 0855  . losartan (COZAAR) tablet 25 mg  25 mg Oral QHS Mauricia Area, MD      . metoprolol (LOPRESSOR) injection 7.5 mg  7.5 mg Intravenous 3 times per day Eugenie Filler, MD   7.5 mg at 06/18/15 0522  . multivitamin (RENA-VIT) tablet 1 tablet  1 tablet Oral Daily Theressa Millard, MD   1 tablet at 06/16/15 1102  . ondansetron (ZOFRAN) tablet 4 mg  4 mg Oral Q6H PRN Theressa Millard, MD       Or  . ondansetron (ZOFRAN) injection 4 mg  4 mg Intravenous Q6H PRN Theressa Millard, MD   4 mg at 06/18/15 0617  . oxyCODONE (Oxy IR/ROXICODONE) immediate release tablet 5 mg  5 mg Oral Q4H PRN Theressa Millard, MD   5 mg at 06/17/15 0310  . pantoprazole (PROTONIX) 80 mg in sodium chloride 0.9 % 250 mL (0.32 mg/mL) infusion  8 mg/hr Intravenous Continuous Eugenie Filler, MD 25 mL/hr at 06/18/15 0620 8 mg/hr at 06/18/15 0620  . [START ON 06/20/2015] pantoprazole (PROTONIX) injection 40 mg  40 mg Intravenous Q12H Irine Seal V, MD      . pravastatin (PRAVACHOL) tablet 20 mg  20 mg Oral QPM Theressa Millard, MD   20 mg at 06/17/15 1715  . sodium chloride 0.9 % injection 3 mL  3 mL Intravenous Q12H Theressa Millard, MD   3 mL at 06/17/15 2250  . sodium chloride 0.9 % injection 3 mL  3 mL Intravenous PRN Theressa Millard, MD      . sorbitol 70 % solution 30 mL  30 mL Oral  PRN Eugenie Filler, MD      . technetium TC 37M diethylenetriame-pentaacetic acid (DTPA) injection 30 milli [place]ie  [place] Inhalation Once PRN Theressa Millard, MD      . zolpidem (AMBIEN) tablet 5 mg  5 mg Oral QHS PRN Eugenie Filler, MD        REVIEW OF SYSTEMS:  [X]  denotes positive finding, [ ]  denotes negative finding Cardiac  Comments:  Chest pain or chest pressure:    Shortness of breath upon exertion:    Short of breath when lying flat:    Irregular heart rhythm:        Vascular    Pain in calf, thigh, or hip brought on by ambulation:    Pain in feet at night that wakes you up from your sleep:     Blood clot in your veins:    Leg swelling:         Pulmonary    Oxygen at home:    Productive cough:     Wheezing:         Neurologic    Sudden weakness in arms or legs:     Sudden numbness in arms or legs:     Sudden onset of difficulty speaking or slurred speech:    Temporary loss of vision in one eye:     Problems with dizziness:         Gastrointestinal    Blood in stool:     Vomited blood:         Genitourinary    Burning when urinating:     Blood in urine:        Psychiatric    Major depression:         Hematologic    Bleeding problems:    Problems with blood clotting too easily:        Skin    Rashes or ulcers:        Constitutional    Fever or chills:      PHYSICAL EXAM: Filed Vitals:   06/18/15 0110 06/18/15 0209 06/18/15 0418 06/18/15 0758  BP: 170/81 166/93 149/97 173/105  Pulse: 99  104 82  Temp:   98.3 F (36.8 C) 98 F (36.7 C)  TempSrc:   Oral Oral  Resp: 23 20 19    Height:      Weight:      SpO2: 94%  98% 97%    GENERAL: The patient is a well-nourished male, in no acute distress. Appears sleepy. The vital signs are documented above. CARDIAC: There is a regular rate and rhythm.  VASCULAR: Palpable radial pulses bilaterally. Palpable left brachial pulse. Nonpalpable right brachial pulse.  PULMONARY:Non labored  breathing.  ABDOMEN: Soft. PD catheter in place.  MUSCULOSKELETAL: There are no major deformities or cyanosis. NEUROLOGIC: No focal weakness or paresthesias are detected. SKIN: There are no ulcers or rashes noted. PSYCHIATRIC: The patient has a normal affect.  DATA:  Vein mapping pending.   MEDICAL ISSUES:  ESRD on HD The patient has been admitted with uremia secondary to noncompliance with peritoneal dialysis. Vein mapping is pending. The patient is right handed. Will make access plans based on vein mapping results. Plan for access placement and tunneled dialysis catheter placement this week. Dr. Donnetta Hutching to evaluate patient.   Hematemesis On protonix drip. Has refused EGD. GI following.   Alvia Grove, PA-C [facility] [place] [phone]     I have examined the patient, reviewed and agree with above. Complex patient who is a failed peritoneal dialysis. Very uremic unable to stay awake. Did explain the need  for hemodialysis. He did have a catheter for short period times understands this. Also discussed with his significant other present with him. She reports this is not his usual behavior. On physical exam he does have a large caliber cephalic vein from the wrist to the forearm. He by physical exam has thrombosis of this vein at the wrist onto the dorsum of his hand. Does seem that he has enough patent vein above this for a cephalic vein fistula. Does have a 2+ radial pulse. Will obtain vein mapping today. We'll plan tunneled catheter for acute hemodialysis tomorrow and left arm access for long-term dialysis tomorrow as well. The patient is right handed  Curt Jews, MD 06/18/2015 2:32 PM

## 2015-06-19 NOTE — Anesthesia Postprocedure Evaluation (Signed)
Anesthesia Post Note  Patient: George [name]  Procedure(s) Performed: Procedure(s) (LRB): INSERTION OF DIALYSIS CATHETER LEFT INTERNAL JUGULAR (Left) LEFT ARTERIOVENOUS (AV) FISTULA CREATION (Left)  Patient location during evaluation: PACU Anesthesia Type: General Level of consciousness: sedated, oriented and patient cooperative Pain management: pain level controlled Vital Signs Assessment: post-procedure vital signs reviewed and stable Respiratory status: spontaneous breathing, nonlabored ventilation, respiratory function stable and patient connected to nasal cannula oxygen Cardiovascular status: blood pressure returned to baseline and stable Postop Assessment: no signs of nausea or vomiting Anesthetic complications: no    Last Vitals:  Filed Vitals:   06/19/15 1550 06/19/15 1557  BP: 120/78 114/71  Pulse: 78 73  Temp: 36.4 C   Resp: 18 17    Last Pain:  Filed Vitals:   06/19/15 1602  PainSc: 0-No pain                 Shadawn Hanaway,E. Issac Moure

## 2015-06-19 NOTE — Anesthesia Preprocedure Evaluation (Addendum)
Anesthesia Evaluation  Patient identified by MRN, date of birth, ID band Patient awake    Reviewed: Allergy & Precautions, NPO status , Patient's Chart, lab work & pertinent test results  History of Anesthesia Complications Negative for: history of anesthetic complications  Airway Mallampati: II  TM Distance: >3 FB Neck ROM: Full    Dental  (+) Dental Advisory Given   Pulmonary COPD,  COPD inhaler, Current Smoker,    breath sounds clear to auscultation       Cardiovascular hypertension, Pt. on medications and Pt. on home beta blockers (-) angina Rhythm:Regular Rate:Normal  12/16 ECHO: EF 60-65%   Neuro/Psych negative neurological ROS     GI/Hepatic GERD  Medicated and Controlled,  Endo/Other  diabetes (glu 168), Insulin Dependent  Renal/GU ESRF and DialysisRenal disease (K+ 3.9)     Musculoskeletal   Abdominal (+) - obese,   Peds  Hematology  (+) Blood dyscrasia (Hb 7.5), ,   Anesthesia Other Findings   Reproductive/Obstetrics                           Anesthesia Physical Anesthesia Plan  ASA: III  Anesthesia Plan: General   Post-op Pain Management:    Induction: Intravenous  Airway Management Planned: LMA  Additional Equipment:   Intra-op Plan:   Post-operative Plan:   Informed Consent: I have reviewed the patients History and Physical, chart, labs and discussed the procedure including the risks, benefits and alternatives for the proposed anesthesia with the patient or authorized representative who has indicated his/her understanding and acceptance.   Dental advisory given  Plan Discussed with: CRNA and Surgeon  Anesthesia Plan Comments: (Plan routine monitors, GA- LMA OK)        Anesthesia Quick Evaluation

## 2015-06-19 NOTE — Progress Notes (Signed)
Pt had a large dark bowel movement; paged Dr. Irine Seal; he ordered a stat CBC; hemocult check and 2 unit PRBCs. Pt refusing upper endscopy; wants [place] to call [facility] to get aurthorization; tried to reach [facility] and failed. Dr. Jimmy Footman also made of pt low Bps; ordered 250cc of NS and UF off for the rest of the tx. First unit of blood given on the HD unit; Hgb 8.6 before PRBCs transfusion. Pt going back to step down unit.

## 2015-06-19 NOTE — Progress Notes (Signed)
   06/19/15 1950  What Happened  Was fall witnessed? Yes  Who witnessed fall? [place], RN and [place], [place]  Patients activity before fall other (comment) (dangling on side of bed)  Point of contact arm/shoulder;hip/leg  Was patient injured? No  Follow Up  MD notified [facility]  Time MD notified 58  Family notified Yes-comment (wife at bedside)  Time family notified 1950  Progress note created (see row info) Yes  Adult Fall Risk Assessment  Risk Factor Category (scoring not indicated) Fall has occurred during this admission (document High fall risk)  Patient's Fall Risk High Fall Risk (>13 points)  Adult Fall Risk Interventions  Required Bundle Interventions *See Row Information* High fall risk - low, moderate, and high requirements implemented  Additional Interventions Fall risk signage;Individualized elimination schedule;Secure all tubes/drains;Specialty bed:  Low bed  Fall with Injury Screening  Risk For Fall Injury- See Row Information  Nurse judgement  Intervention(s) for 2 or more risk criteria identified Low Bed  Pain Assessment  Pain Assessment No/denies pain  Pain Score 0  PCA/Epidural/Spinal Assessment  Respiratory Pattern Regular  Neurological  Neuro (WDL) WDL  Level of Consciousness Responds to Voice  Orientation Level Oriented X4  Cognition Appropriate at baseline;Appropriate attention/concentration;Appropriate judgement;Appropriate safety awareness;Appropriate for developmental age;Follows commands;No memory impairment  Speech Clear  Neuro Symptoms Drowsiness  Glasgow Coma Scale  Eye Opening 4  Best Verbal Response (NON-intubated) 5  Best Motor Response 6  Glasgow Coma Scale Score 15  Musculoskeletal  Musculoskeletal (WDL) X  Assistive Device None  Generalized Weakness Yes  Weight Bearing Restrictions No  Integumentary  Integumentary (WDL) WDL  Skin Color Appropriate for ethnicity  Skin Integrity Intact

## 2015-06-19 NOTE — Consult Note (Signed)
Reason for Consult:  Removal of PD catheter Referring Physician: Dr. Jeneen Rinks Deterding  George [name] is an 44 y.o. male.  HPI: Pt admitted on 06/16/15 with right sided chest pain and SOB.  Sats in the Ed were 85%, and he was placed on O2 and admitted for evaluation.  He developed nausea and vomiting in the Ed, and was found to be hypoglycemic.  He was also complaining of RLE pain.  It was DR Jonnie Finner pt was uremic secondary to underdialysis.  He was not complaint with the home Peritoneal dialysis as prescribed.  He has been on PD for 1 year and had catheter placed in [place] where he lived when he required dialysis.  He was seen on 06/17/15 for hematemesis.  It was reported he was using high dose ibuprofen for his leg pain. Dr. Penelope Coop offered EGD and he has refused this.  On 06/19/15 he had a PD catheter placed by Dr. Oneida Alar.  We are now ask to remove PD catheter.    Workup here shows he is afebrile, BP with poor control.  Admit creatinine was 29.67, K+ 5.7, hemoglobin was 7.0, troponins were minimally elevated.  He has been transfused, stool occult was positive, he is negative for hepatitis. V/Q scan was low probability on admit 06/16/15.    Past Medical History  Diagnosis Date  Diabetes mellitus without complication  Refusing insulin   Hypertension on 3 medicines for this   ESRD on Peritoneal dialysis for 1 year   Anemia    Dyslipidemia         Past Surgical History  Procedure Laterality Date  . Peritoneal dialysis catheter placed      around 2015 in [place]    No family history on file.  Social History:  reports that he has been smoking Cigarettes.  He does not have any smokeless tobacco history on file. He reports that he uses illicit drugs (Marijuana). He reports that he does not drink alcohol.  Allergies:  Allergies  Allergen Reactions  . Lisinopril Shortness Of Breath    Pt's family said he had SOB, Chest pressure , and coughing     Medications:  Prior to Admission:   Prescriptions prior to admission  Medication Sig Dispense Refill Last Dose  . amLODipine (NORVASC) 5 MG tablet Take 5 mg by mouth 2 (two) times daily.   06/15/2015 at am  . aspirin EC 81 MG tablet Take 81 mg by mouth daily.   06/15/2015 at Unknown time  . carvedilol (COREG) 25 MG tablet Take 25 mg by mouth 2 (two) times daily with a meal.   06/15/2015 at 0900  . digoxin (LANOXIN) 0.125 MG tablet Take 0.125 mg by mouth every other day.   06/15/2015 at Unknown time  . furosemide (LASIX) 80 MG tablet Take 160 mg by mouth 2 (two) times daily.   06/15/2015 at am  . ibuprofen (ADVIL,MOTRIN) 800 MG tablet Take 1 tablet (800 mg total) by mouth 3 (three) times daily. 21 tablet 0 06/15/2015 at Unknown time  . isosorbide-hydrALAZINE (BIDIL) 20-37.5 MG tablet Take 1 tablet by mouth 3 (three) times daily.   06/15/2015 at Unknown time  . lanthanum (FOSRENOL) 1000 MG chewable tablet Chew 1,000 mg by mouth 3 (three) times daily with meals.   06/15/2015 at Unknown time  . losartan (COZAAR) 25 MG tablet Take 25 mg by mouth daily.   06/15/2015 at Unknown time  . multivitamin (RENA-VIT) TABS tablet Take 1 tablet by mouth daily.   06/15/2015 at  Unknown time  . pravastatin (PRAVACHOL) 20 MG tablet Take 20 mg by mouth every evening.   06/14/2015 at Unknown time  . sitaGLIPtin (JANUVIA) 25 MG tablet Take 25 mg by mouth daily.   06/15/2015 at Unknown time  . cyclobenzaprine (FLEXERIL) 10 MG tablet Take 1 tablet (10 mg total) by mouth 2 (two) times daily as needed for muscle spasms. 20 tablet 0    Scheduled: . sodium chloride   Intravenous Once  . acetaminophen  650 mg Oral Once  . amLODipine  2.5 mg Oral QHS  . carvedilol  25 mg Oral BID WC  . [START ON 06/25/2015] darbepoetin (ARANESP) injection - DIALYSIS  200 mcg Intravenous Q Mon-HD  . digoxin  0.125 mg Oral QODAY  . diphenhydrAMINE  25 mg Oral Once  . gentamicin cream  1 application Topical Daily  . insulin aspart  0-9 Units Subcutaneous 6 times per day  .  isosorbide-hydrALAZINE  1 tablet Oral TID  . lanthanum  1,000 mg Oral TID WC  . multivitamin  1 tablet Oral Daily  . [START ON 06/20/2015] pantoprazole (PROTONIX) IV  40 mg Intravenous Q12H  . pravastatin  20 mg Oral QPM  . sodium chloride  3 mL Intravenous Q12H   Continuous: . pantoprozole (PROTONIX) infusion 8 mg/hr (06/19/15 1145)   PQA:ESLPNP chloride, acetaminophen **OR** acetaminophen, acetaminophen **OR** acetaminophen, calcium carbonate (dosed in mg elemental calcium), camphor-menthol **AND** hydrOXYzine, chlorproMAZINE (THORAZINE) IV, docusate sodium, feeding supplement (NEPRO CARB STEADY), heparin, hydrALAZINE, HYDROmorphone (DILAUDID) injection, ondansetron **OR** ondansetron (ZOFRAN) IV, oxyCODONE, sodium chloride, sorbitol, technetium TC 80M diethylenetriame-pentaacetic acid, zolpidem Anti-infectives    Start     Dose/Rate Route Frequency Ordered Stop   06/19/15 0800  cefUROXime (ZINACEF) 1.5 g in dextrose 5 % 50 mL IVPB     1.5 g 100 mL/hr over 30 Minutes Intravenous To [facility] 06/18/15 1435 06/19/15 1217      Results for orders placed or performed during the hospital encounter of 06/15/15 (from the past 48 hour(s))  Glucose, capillary     Status: Abnormal   Collection Time: 06/17/15  5:13 PM  Result Value Ref Range   Glucose-Capillary 114 (H) 65 - 99 mg/dL  Troponin I (q 6hr x 3)     Status: Abnormal   Collection Time: 06/17/15 11:43 PM  Result Value Ref Range   Troponin I 0.10 (H) <0.031 ng/mL    Comment:        PERSISTENTLY INCREASED TROPONIN VALUES IN THE RANGE OF 0.04-0.49 ng/mL CAN BE SEEN IN:       -UNSTABLE ANGINA       -CONGESTIVE HEART FAILURE       -MYOCARDITIS       -CHEST TRAUMA       -ARRYHTHMIAS       -LATE PRESENTING MYOCARDIAL INFARCTION       -COPD   CLINICAL FOLLOW-UP RECOMMENDED.   CBC     Status: Abnormal   Collection Time: 06/17/15 11:43 PM  Result Value Ref Range   WBC 11.1 (H) 4.0 - 10.5 K/uL   RBC 2.71 (L) 4.22 - 5.81  MIL/uL   Hemoglobin 8.0 (L) 13.0 - 17.0 g/dL   HCT 23.3 (L) 39.0 - 52.0 %   MCV 86.0 78.0 - 100.0 fL   MCH 29.5 26.0 - 34.0 pg   MCHC 34.3 30.0 - 36.0 g/dL   RDW 15.2 11.5 - 15.5 %   Platelets 160 150 - 400 K/uL  Occult blood card to lab, stool  Status: Abnormal   Collection Time: 06/18/15 12:26 AM  Result Value Ref Range   Fecal Occult Bld POSITIVE (A) NEGATIVE  Glucose, capillary     Status: Abnormal   Collection Time: 06/18/15  7:57 AM  Result Value Ref Range   Glucose-Capillary 144 (H) 65 - 99 mg/dL   Comment 1 Document in Chart   Renal function panel     Status: Abnormal   Collection Time: 06/18/15  8:10 AM  Result Value Ref Range   Sodium 142 135 - 145 mmol/L   Potassium 4.3 3.5 - 5.1 mmol/L   Chloride 104 101 - 111 mmol/L   CO2 21 (L) 22 - 32 mmol/L   Glucose, Bld 147 (H) 65 - 99 mg/dL   BUN 143 (H) 6 - 20 mg/dL   Creatinine, Ser 22.64 (H) 0.61 - 1.24 mg/dL   Calcium 8.4 (L) 8.9 - 10.3 mg/dL   Phosphorus 8.0 (H) 2.5 - 4.6 mg/dL   Albumin 2.7 (L) 3.5 - 5.0 g/dL   GFR calc non Af Amer 2 (L) >60 mL/min   GFR calc Af Amer 2 (L) >60 mL/min    Comment: (NOTE) The eGFR has been calculated using the CKD EPI equation. This calculation has not been validated in all clinical situations. eGFR's persistently <60 mL/min signify possible Chronic Kidney Disease.    Anion gap 17 (H) 5 - 15  CBC     Status: Abnormal   Collection Time: 06/18/15  8:10 AM  Result Value Ref Range   WBC 10.3 4.0 - 10.5 K/uL   RBC 2.55 (L) 4.22 - 5.81 MIL/uL   Hemoglobin 7.6 (L) 13.0 - 17.0 g/dL   HCT 21.3 (L) 39.0 - 52.0 %   MCV 83.5 78.0 - 100.0 fL   MCH 29.8 26.0 - 34.0 pg   MCHC 35.7 30.0 - 36.0 g/dL   RDW 15.4 11.5 - 15.5 %   Platelets 164 150 - 400 K/uL  Hepatitis B surface antigen     Status: None   Collection Time: 06/18/15  8:49 AM  Result Value Ref Range   Hepatitis B Surface Ag Negative Negative    Comment: (NOTE) Performed At: [facility] [place], [place]  [id number] Lindon Romp MD RJ:[id number]   Hepatitis B surface antibody     Status: None   Collection Time: 06/18/15  8:49 AM  Result Value Ref Range   Hep B S Ab Reactive     Comment: (NOTE)              Non Reactive: Inconsistent with immunity,                            less than 10 mIU/mL              Reactive:     Consistent with immunity,                            greater than 9.9 mIU/mL Performed At: [facility] [place], [place] [id number] Lindon Romp MD [id number]   Hepatitis B core antibody, IgM     Status: None   Collection Time: 06/18/15  8:49 AM  Result Value Ref Range   Hep B C IgM Negative Negative    Comment: (NOTE) Performed At: [facility] [place], [place] [id number] Lindon Romp MD [id number]  Glucose, capillary     Status: Abnormal   Collection Time: 06/18/15 12:24 PM  Result Value Ref Range   Glucose-Capillary 129 (H) 65 - 99 mg/dL  Hemoglobin and hematocrit, blood     Status: Abnormal   Collection Time: 06/18/15  4:45 PM  Result Value Ref Range   Hemoglobin 7.8 (L) 13.0 - 17.0 g/dL   HCT 23.0 (L) 39.0 - 52.0 %  Glucose, capillary     Status: None   Collection Time: 06/18/15  5:44 PM  Result Value Ref Range   Glucose-Capillary 90 65 - 99 mg/dL  Glucose, capillary     Status: Abnormal   Collection Time: 06/18/15  9:10 PM  Result Value Ref Range   Glucose-Capillary 105 (H) 65 - 99 mg/dL  Glucose, capillary     Status: Abnormal   Collection Time: 06/19/15  1:10 AM  Result Value Ref Range   Glucose-Capillary 189 (H) 65 - 99 mg/dL  Renal function panel     Status: Abnormal   Collection Time: 06/19/15  3:10 AM  Result Value Ref Range   Sodium 142 135 - 145 mmol/L   Potassium 3.9 3.5 - 5.1 mmol/L   Chloride 104 101 - 111 mmol/L   CO2 22 22 - 32 mmol/L   Glucose, Bld 223 (H) 65 - 99 mg/dL   BUN 134 (H) 6 - 20 mg/dL   Creatinine, Ser 22.20 (H) 0.61 - 1.24 mg/dL   Calcium 8.5 (L) 8.9  - 10.3 mg/dL   Phosphorus 7.6 (H) 2.5 - 4.6 mg/dL   Albumin 2.7 (L) 3.5 - 5.0 g/dL   GFR calc non Af Amer 2 (L) >60 mL/min   GFR calc Af Amer 2 (L) >60 mL/min    Comment: (NOTE) The eGFR has been calculated using the CKD EPI equation. This calculation has not been validated in all clinical situations. eGFR's persistently <60 mL/min signify possible Chronic Kidney Disease.    Anion gap 16 (H) 5 - 15  CBC     Status: Abnormal   Collection Time: 06/19/15  3:10 AM  Result Value Ref Range   WBC 10.2 4.0 - 10.5 K/uL   RBC 2.59 (L) 4.22 - 5.81 MIL/uL   Hemoglobin 7.5 (L) 13.0 - 17.0 g/dL   HCT 22.4 (L) 39.0 - 52.0 %   MCV 86.5 78.0 - 100.0 fL   MCH 29.0 26.0 - 34.0 pg   MCHC 33.5 30.0 - 36.0 g/dL   RDW 15.6 (H) 11.5 - 15.5 %   Platelets 171 150 - 400 K/uL  Glucose, capillary     Status: Abnormal   Collection Time: 06/19/15  4:36 AM  Result Value Ref Range   Glucose-Capillary 179 (H) 65 - 99 mg/dL  Glucose, capillary     Status: Abnormal   Collection Time: 06/19/15  8:19 AM  Result Value Ref Range   Glucose-Capillary 195 (H) 65 - 99 mg/dL  Glucose, capillary     Status: Abnormal   Collection Time: 06/19/15 11:13 AM  Result Value Ref Range   Glucose-Capillary 168 (H) 65 - 99 mg/dL    Dg Fluoro Guide Cv Line-no Report  06/19/2015  CLINICAL DATA:  FLOURO GUIDE CV LINE Fluoroscopy was utilized by the requesting physician.  No radiographic interpretation.    Review of Systems  Unable to perform ROS: medical condition  Constitutional:       Post catheter placement and still pretty sleepy.   Blood pressure 103/54, pulse 71, temperature 97.4 F (36.3 C), temperature  source Oral, resp. rate 18, height 6' (1.829 m), weight 91.6 kg (201 lb 15.1 oz), SpO2 100 %. Physical Exam  Constitutional: He appears well-developed and well-nourished. No distress.  HENT:  Head: Normocephalic and atraumatic.  Nose: Nose normal.  Eyes: Conjunctivae and EOM are normal. Right eye exhibits no  discharge. Left eye exhibits no discharge.  Neck: Neck supple. No JVD present. No tracheal deviation present. No thyromegaly present.  Cardiovascular: Normal rate, regular rhythm and normal heart sounds.   No murmur heard. Respiratory: Effort normal and breath sounds normal. No respiratory distress. He has no wheezes. He has no rales. He exhibits no tenderness.  GI: Soft. Bowel sounds are normal. He exhibits no distension and no mass. There is no tenderness. There is no rebound and no guarding.  PD cath in place   Musculoskeletal: He exhibits no edema.  Lymphadenopathy:    He has no cervical adenopathy.  Neurological: He is alert. No cranial nerve deficit.  Skin: Skin is warm and dry. No rash noted. He is not diaphoretic. No erythema. No pallor.  Psychiatric:  Still sleepy from anesthesia    Assessment/Plan: ESRD on PD with inadequate dialysis Starting HD AODM Hypertension Anemia, possible GI bleed, refused EGD Dyslipidemia  Plan:  We currently have arranged for him to get PD cath removed on Friday at 1:30PM, by Dr.Ramirez.   If he is to leave before that please call.  I have an alternative plan for follow up as an OP in January with Dr. Excell Seltzer.    George [name] 06/19/2015, 2:46 PM

## 2015-06-19 NOTE — Procedures (Signed)
I was present at this session.  I have reviewed the session itself and made appropriate changes.  HD via [place] cath.  Slow, low flow, short.   Woods Gangemi L 12/20/20163:46 PM

## 2015-06-20 ENCOUNTER — Telehealth: Payer: Self-pay | Admitting: Vascular Surgery

## 2015-06-20 ENCOUNTER — Encounter (HOSPITAL_COMMUNITY): Payer: Self-pay | Admitting: Vascular Surgery

## 2015-06-20 ENCOUNTER — Encounter (HOSPITAL_COMMUNITY)
Admission: EM | Disposition: A | Discharge status: Home / Self Care | Payer: Self-pay | Source: Home / Self Care | Attending: Internal Medicine

## 2015-06-20 ENCOUNTER — Inpatient Hospital Stay (HOSPITAL_COMMUNITY): Payer: Medicare Other

## 2015-06-20 DIAGNOSIS — I63511 Cerebral infarction due to unspecified occlusion or stenosis of right middle cerebral artery: Secondary | ICD-10-CM

## 2015-06-20 LAB — HEPATIC FUNCTION PANEL
ALT: 31 U/L (ref 17–63)
AST: 34 U/L (ref 15–41)
Albumin: 2.7 g/dL — ABNORMAL LOW (ref 3.5–5.0)
Alkaline Phosphatase: 57 U/L (ref 38–126)
BILIRUBIN INDIRECT: 0.3 mg/dL (ref 0.3–0.9)
Bilirubin, Direct: 0.1 mg/dL (ref 0.1–0.5)
TOTAL PROTEIN: 6.5 g/dL (ref 6.5–8.1)
Total Bilirubin: 0.4 mg/dL (ref 0.3–1.2)

## 2015-06-20 LAB — CBC
HEMATOCRIT: 29 % — AB (ref 39.0–52.0)
Hemoglobin: 9.6 g/dL — ABNORMAL LOW (ref 13.0–17.0)
MCH: 29.6 pg (ref 26.0–34.0)
MCHC: 33.1 g/dL (ref 30.0–36.0)
MCV: 89.5 fL (ref 78.0–100.0)
Platelets: 144 10*3/uL — ABNORMAL LOW (ref 150–400)
RBC: 3.24 MIL/uL — AB (ref 4.22–5.81)
RDW: 16.4 % — AB (ref 11.5–15.5)
WBC: 15.7 10*3/uL — AB (ref 4.0–10.5)

## 2015-06-20 LAB — HEMOGLOBIN AND HEMATOCRIT, BLOOD
HCT: 26.2 % — ABNORMAL LOW (ref 39.0–52.0)
HEMATOCRIT: 26 % — AB (ref 39.0–52.0)
HEMATOCRIT: 26.9 % — AB (ref 39.0–52.0)
HEMOGLOBIN: 8.8 g/dL — AB (ref 13.0–17.0)
Hemoglobin: 8.8 g/dL — ABNORMAL LOW (ref 13.0–17.0)
Hemoglobin: 8.7 g/dL — ABNORMAL LOW (ref 13.0–17.0)

## 2015-06-20 LAB — TYPE AND SCREEN
ABO/RH(D): O POS
ANTIBODY SCREEN: NEGATIVE
UNIT DIVISION: 0
Unit division: 0
Unit division: 0
Unit division: 0

## 2015-06-20 LAB — RENAL FUNCTION PANEL
ALBUMIN: 2.7 g/dL — AB (ref 3.5–5.0)
ANION GAP: 12 (ref 5–15)
BUN: 92 mg/dL — AB (ref 6–20)
CO2: 23 mmol/L (ref 22–32)
Calcium: 8.4 mg/dL — ABNORMAL LOW (ref 8.9–10.3)
Chloride: 105 mmol/L (ref 101–111)
Creatinine, Ser: 17.38 mg/dL — ABNORMAL HIGH (ref 0.61–1.24)
GFR, EST AFRICAN AMERICAN: 3 mL/min — AB (ref >60)
GFR, EST NON AFRICAN AMERICAN: 3 mL/min — AB (ref >60)
Glucose, Bld: 98 mg/dL (ref 65–99)
PHOSPHORUS: 7.3 mg/dL — AB (ref 2.5–4.6)
POTASSIUM: 4.5 mmol/L (ref 3.5–5.1)
Sodium: 140 mmol/L (ref 135–145)

## 2015-06-20 LAB — GLUCOSE, CAPILLARY
GLUCOSE-CAPILLARY: 87 mg/dL (ref 65–99)
GLUCOSE-CAPILLARY: 96 mg/dL (ref 65–99)
GLUCOSE-CAPILLARY: 104 mg/dL — AB (ref 65–99)
GLUCOSE-CAPILLARY: 103 mg/dL — AB (ref 65–99)
GLUCOSE-CAPILLARY: 107 mg/dL — AB (ref 65–99)
Glucose-Capillary: 103 mg/dL — ABNORMAL HIGH (ref 65–99)
Glucose-Capillary: 120 mg/dL — ABNORMAL HIGH (ref 65–99)

## 2015-06-20 LAB — PTH, INTACT AND CALCIUM
CALCIUM TOTAL (PTH): 7.7 mg/dL — AB (ref 8.7–10.2)
PTH: 467 pg/mL — ABNORMAL HIGH (ref 15–65)

## 2015-06-20 SURGERY — CANCELLED PROCEDURE

## 2015-06-20 MED ORDER — SODIUM CHLORIDE 0.9 % IV SOLN
INTRAVENOUS | Status: DC
  Administered 2015-06-20: 40 mL/h via INTRAVENOUS
  Administered 2015-06-21: 23:00:00 via INTRAVENOUS

## 2015-06-20 MED ORDER — STROKE: EARLY STAGES OF RECOVERY BOOK
Freq: Once | Status: AC
  Administered 2015-06-20: 1
  Filled 2015-06-20: qty 1

## 2015-06-20 MED ORDER — CEFAZOLIN (ANCEF) 1 G IV SOLR
2.0000 g | INTRAVENOUS | Status: AC
  Filled 2015-06-20: qty 2

## 2015-06-20 MED ORDER — CALCIUM ACETATE (PHOS BINDER) 667 MG PO CAPS
667.0000 mg | ORAL_CAPSULE | Freq: Three times a day (TID) | ORAL | Status: DC
  Administered 2015-06-22 – 2015-06-27 (×12): 667 mg via ORAL
  Filled 2015-06-20 (×11): qty 1

## 2015-06-20 MED ORDER — CARVEDILOL 12.5 MG PO TABS
12.5000 mg | ORAL_TABLET | Freq: Two times a day (BID) | ORAL | Status: DC
  Administered 2015-06-22 – 2015-06-26 (×8): 12.5 mg via ORAL
  Filled 2015-06-20 (×10): qty 1

## 2015-06-20 NOTE — Progress Notes (Addendum)
  Postoperative hemodialysis access     Date of Surgery:  06/20/15 Surgeon: Oneida Alar  Subjective:  somnolent but awakes and answers questions and follows commands.  Denies any pain or numbness in his left hand  PHYSICAL EXAMINATION:  Filed Vitals:   06/20/15 0600 06/20/15 0746  BP: 133/90 115/59  Pulse:  100  Temp:  99.2 F (37.3 C)  Resp: 21 16    Incision is clean and dry Sensation in digits is intact;  There is  Thrill  There is bruit. The graft/fistula is palpable  Left radial pulse is not palpable   ASSESSMENT/PLAN:  George [name] is a 44 y.o. year old male who is s/p  #1 ultrasound neck #2 insertion of 28 cm Palindrome catheter (left internal jugular vein) #3 left brachial cephalic AV fistula  -graft/fistula is patent -pt does not have evidence of steal sx as he did wake up and answer questions and follow commands-d/w pt's family member if he develops steal sx to call us sooner rather than later-she expressed understanding. -f/u with Dr. Oneida Alar in 4-6 weeks to check maturation of AVF -pt with melena/GIB-possible endoscopy today -will sign off-call as needed.   Leontine Locket, PA-C [facility] [phone]

## 2015-06-20 NOTE — Consult Note (Addendum)
Neurology Consultation Reason for Consult: Stroke Referring Physician: Venia Minks  CC: Stroke  History is obtained from:patient, fiance  HPI: George [name] is a 44 y.o. male with a history of ESRD 2/2 HTN, DM, HTN current smoker who presented with SOB and CP and found to be very uremic. He also endorses taht he had sudden onset dizziness, and increased confusion in the middle of last week. His Fiance states taht at least since Sunday, he has had a left facial droop.   He has been drowsy with some dysarthria, but this was attributed to uremia  He had no difficuly with walking.   LKW: unclear, liekly middle of last week, 12/14 or 12/15 tpa given?: no, delay in arrival, unclear time of onset.   ROS: A 14 point ROS was performed and is negative except as noted in the HPI.   Past Medical History  Diagnosis Date  . Diabetes mellitus without complication ([place])   . Hypertension   . Renal disorder      FHx: Grandma - stroke  Social History:  reports that he has been smoking Cigarettes.  He does not have any smokeless tobacco history on file. He reports that he uses illicit drugs (Marijuana). He reports that he does not drink alcohol.   Exam: Current vital signs: BP 105/69 mmHg  Pulse 81  Temp(Src) 98.2 F (36.8 C) (Axillary)  Resp 14  Ht 6' (1.829 m)  Wt 87.9 kg (193 lb 12.6 oz)  BMI 26.28 kg/m2  SpO2 99% Vital signs in last 24 hours: Temp:  [97.1 F (36.2 C)-99.2 F (37.3 C)] 98.2 F (36.8 C) (12/21 1204) Pulse Rate:  [69-100] 81 (12/21 1204) Resp:  [11-28] 14 (12/21 1204) BP: (78-165)/(47-97) 105/69 mmHg (12/21 1204) SpO2:  [94 %-100 %] 99 % (12/21 1204) Weight:  [87.9 kg (193 lb 12.6 oz)] 87.9 kg (193 lb 12.6 oz) (12/21 0500)  Physical Exam  Constitutional: Appears well-developed and well-nourished.  Psych: Does not engage much, but does answer questions.  Eyes: No scleral injection HENT: No OP obstrucion Head: Normocephalic.  Cardiovascular: Normal rate and regular  rhythm.  Respiratory: Effort normal and breath sounds normal to anterior ascultation GI: Soft.  No distension. There is no tenderness.  Skin: WDI  Neuro: Mental Status: Patient is awake, alert, oriented to person, place, month, year. Patient is able to give a clear and coherent history. No signs of aphasia  He has a mild right gaze preference, but does not extinguish to DSS Cranial Nerves: II: Visual Fields are full.  III,IV, VI: Right gaze prer V: Facial sensation is symmetric to temperature VII: Facial movement is notable for prominent left facial weakness.  VIII: hearing is intact to voice X: Uvula elevates symmetrically XI: Shoulder shrug is symmetric. XII: tongue is midline without atrophy or fasciculations.  Motor: Tone is normal. Bulk is normal. 5/5 strength was present on the right, he has 4/5 weakness of the left arm and 4+/5 in the left leg.  Sensory: Sensation is symmetric to light touch and temperature in the arms and legs. Cerebellar: FNF  intact bilaterally         I have reviewed labs in epic and the results pertinent to this consultation are: hgb 8.8 BUN 92  I have reviewed the images obtained:CT head- moderate to large area of infarct in the right anterior MCA   Impression: 43 yo M with right MCA branch infarct. There is some edema, but would not pursue central line/hyperosmolar therapy at this time  with no midline shift and suspicion that this happened last week. He is not currently on antiplatelets due to GI bleeding. He initially refused EGD, but is now more cooperative   Possible etiologies would include cardiac embolus, artery to artery embolus, large vessel atherosclerosis, tight stenosis with subsequent drop in BP/HCT due to GI bleed with subsequent ischemia(though this last one I feel is less likely given appearance on CT). Uremia has also likely been playiong a role in his mental status as well.   Recommendations: 1. HgbA1c, fasting lipid  panel 2. MRI, MRA  of the brain without contrast 3. Frequent neuro checks 4. Echocardiogram already done 12/19 5. Carotid dopplers 6. Prophylactic therapy- pending GI evaluation, would recommend anitplatelet therapy.  7. Risk factor modification 8. Telemetry monitoring 9. PT consult, OT consult, Speech consult    Roland Rack, MD [facility] [phone]  If 7pm- 7am, please page neurology on call as listed in [place].

## 2015-06-20 NOTE — Plan of Care (Signed)
Problem: Education: Goal: Knowledge of disease or condition will improve Outcome: Progressing Patient and family given booklet "Stroke-Early Stages of Recovery".

## 2015-06-20 NOTE — Progress Notes (Signed)
1 Day Post-Op  Subjective: He is still very sedated, his fiance says he has been like this since he was hospitalized and it is secondary to his uremia.  He is going to have an EGD today.   Objective: Vital signs in last 24 hours: Temp:  [97.1 F (36.2 C)-98.7 F (37.1 C)] 98.2 F (36.8 C) (12/21 0413) Pulse Rate:  [68-89] 87 (12/21 0413) Resp:  [11-28] 21 (12/21 0600) BP: (78-165)/(47-99) 133/90 mmHg (12/21 0600) SpO2:  [94 %-100 %] 97 % (12/21 0551) Weight:  [87.9 kg (193 lb 12.6 oz)] 87.9 kg (193 lb 12.6 oz) (12/21 0500) Last BM Date: 06/19/15 6 stools, melanotic stools reported NPO Afebrile, hypertensive Fecal occult positive Labs stable Intake/Output from previous day: 12/20 0701 - 12/21 0700 In: [id number] [I.V.:425; Blood:675] Out: [id number] [Blood:30] Intake/Output this shift:    General appearance: sedated, resting comfortably, he doesn't really wake up to talk to me. GI: soft, non-tender; bowel sounds normal; no masses,  no organomegaly and PD cath looks fine.  Lab Results:   Recent Labs  06/19/15 1807 06/20/15 0307  WBC 13.0* 15.7*  HGB 8.6* 9.6*  HCT 24.8* 29.0*  PLT 191 144*    BMET  Recent Labs  06/19/15 1600 06/20/15 0500  NA 144 140  K 4.5 4.5  CL 105 105  CO2 24 23  GLUCOSE 93 98  BUN 113* 92*  CREATININE 20.64* 17.38*  CALCIUM 8.2* 8.4*   PT/INR No results for input(s): LABPROT, INR in the last 72 hours.   Recent Labs Lab 06/17/15 0543 06/18/15 0810 06/19/15 0310 06/19/15 1600 06/20/15 0500  ALBUMIN 2.5* 2.7* 2.7* 2.7* 2.7*     Lipase  No results found for: LIPASE   Studies/Results: Dg Chest Port 1 View  06/19/2015  CLINICAL DATA:  Status post dialysis catheter placement today. Initial encounter. EXAM: PORTABLE CHEST 1 VIEW COMPARISON:  PA and lateral chest 06/15/2015. FINDINGS: Right IJ approach dialysis catheter is in place with the tip projecting within the right atrium. No pneumothorax. Right effusion basilar airspace disease  have markedly improved. The left lung is clear. There is cardiomegaly. IMPRESSION: Tip of dialysis catheter projects in the right atrium. Negative for pneumothorax. Improved right pleural effusion and basilar airspace disease. Electronically Signed   By: Inge Rise M.D.   On: 06/19/2015 15:02   Dg Fluoro Guide Cv Line-no Report  06/19/2015  CLINICAL DATA:  FLOURO GUIDE CV LINE Fluoroscopy was utilized by the requesting physician.  No radiographic interpretation.    Medications: . sodium chloride   Intravenous Once  . sodium chloride   Intravenous Once  . acetaminophen  650 mg Oral Once  . acetaminophen  650 mg Oral Once  . calcium acetate  667 mg Oral TID WC  . carvedilol  12.5 mg Oral BID WC  . [START ON 06/25/2015] darbepoetin (ARANESP) injection - DIALYSIS  200 mcg Intravenous Q Mon-HD  . digoxin  0.125 mg Oral QODAY  . diphenhydrAMINE  25 mg Oral Once  . diphenhydrAMINE  25 mg Oral Once  . gentamicin cream  1 application Topical Daily  . heparin  40 Units/kg Dialysis Once in dialysis  . insulin aspart  0-9 Units Subcutaneous 6 times per day  . isosorbide-hydrALAZINE  1 tablet Oral TID  . lanthanum  1,000 mg Oral TID WC  . multivitamin  1 tablet Oral Daily  . pantoprazole (PROTONIX) IV  40 mg Intravenous Q12H  . pravastatin  20 mg Oral QPM  . sodium chloride  3 mL Intravenous Q12H    Assessment/Plan ESRD on PD with inadequate dialysis Anemia, possible GI bleed, refused EGD Starting HD AODM Hypertension Dyslipidemia    Plan:  We have him set up for tomorrow, if for some reason he cannot have surgery tomorrow, I also have an outpatient follow up arranged.  Call if you think we cannot do surgery tomorrow.     LOS: 4 days    George [name] 06/20/2015

## 2015-06-20 NOTE — Telephone Encounter (Signed)
Spoke with pts wife, dpm °

## 2015-06-20 NOTE — Progress Notes (Signed)
[facility] PROGRESS NOTE  George [name] [id number] DOB: 28-Feb-1971 DOA: 06/15/2015 PCP: No primary care provider on file.   brief interval history George [name] is a 44 y.o. male with a history of ESRD on Peritoneal dialysis, DM2, and HTN who presents to the ED with complaints of right sided chest pain and SOB. He was found to have hypoxemia in the ED to 85%. He was placed on NCO2 at 2 liters. He was referred for further workup.   patient also noted to have a 2-3 month history of anorexia or fatigue as well as nausea , cough.  Patient was supposed to be doing extra exchange in the evening but didn't do it because he was concerned that it will decrease his PD deficiency according to things told to him by his doctors before he left [place] about his PD.  Patient was admitted treated for uremia secondary to under dialysis with CCPD  Per renal. Patient also noted during the hospitalization to have coffee-ground emesis was placed on a Protonix drip and transfused 2 units packed red blood cells. GI was consulted who followed the patient however patient refused upper endoscopy.  Coffee-ground emesis  Improved and patient had no further episodes. Patient to be transitioned to hemodialysis per nephrology.   Assessment/Plan: #1 uremia/under dialysis/end-stage renal disease on peritoneal dialysis Patient presented with uremia, probably from missing peritoneal dialysis. Patient denies any chest pain. Patient denies any shortness of breath.Patient on peritoneal dialysis. Patient had vein mapping and and plan for access placement and tunneled dialysis catheter placement on 12/20 per vascular surgery for transition to HD. Per nephrology.  #2 hematemesis/coffee-ground emesis Patient with coffee-ground emesis on 06/17/2015. Patient endorsed recent use of ibuprofen 800 mg 4 times daily for right leg pain. Patient denies daily chronic alcohol use. Per wife patient with prior history of duodenal ulcer.  Continue Protonix drip. Patient status post 2 units packed red blood cells . Patient was seen in consultation by gastroenterology, Dr. Penelope Coop who had recommended an upper endoscopy however patient refused initially but agreed to it. Transfusion threshold hemoglobin less than 7. GI following and appreciate input and recommendations. He was scheduled for EGD on 12/21, but his CT head revealed to have a subacute CVA. His EGD was postponed to 12/22. NPO after midnight.   #3 hypertension Continue Norvasc, digoxin, BiDil, Cozaar.Change IV Lopressor to home dose beta blocker.  #4 well-controlled diabetes mellitus Patient on oral diabetic medications at home. Hemoglobin A1c = 5.4. Continue sliding scale insulin. Hold oral hypoglycemic agents. Follow.  #5 anemia/acute blood loss anemia Likely multifactorial secondary to acute blood loss anemia and anemia of chronic disease. Anemia panel with iron of 145 TIBC of 200 ferritin of 846 consistent with anemia of chronic disease likely secondary to end-stage renal disease on hemodialysis. Patient with coffee-ground emesis on 06/17/2015. He was also found to have melanotic stools on 12/20 .  Patient status post 2 units packed red blood cells.  Continue Protonix drip. Transfusion threshold hemoglobin less than 7. GI following. Follow.  #6 elevated troponins Patient denies any acute chest pain. Troponins trending down..If acute coronary syndrome may be secondary to problem #1. 2-D echo with EF of 60-65% with severe LVH, grade 1 diastolic dysfunction. Severe nodular posterior annular calcification on the mitral valve. Continue beta blocker, ARB, BiDil, digoxin. Follow.  #7 shortness of breath Likely secondary to hypervolemia and under hemodialysis. VQ scan negative for PE. Lower extremity Dopplers negative for DVT or SVT. Patient on CPPD per renal to  be converted to HD.  #8 prophylaxis PPI for GI prophylaxis. SCDs for DVT prophylaxis.   #9 acute on chronic  encephalopathy: Dr Deterding recommended CT head, which revealed a sub acute right MCA stroke. Stroke work up ordered with an MRI of the brain and MRA head and neck ordered.  Neurology consulted and recommendations given.  Plan for EGD in am followe dby anti platelet agents use to prevent further strokes.   Code Status: Full Family Communication: Updated patient and wife at bedside. Disposition Plan: Transfer to [facility],   Consultants:  Nephrology: Dr. Jonnie Finner 06/16/2015  Gastroenterology: Dr. Penelope Coop 06/17/2015  Procedures:  V/Q scan 06/16/2015  CXR 06/16/2015  2-D echo 06/18/2015  2 units packed red blood cells 06/17/2015  Antibiotics:  None  HPI/Subjective: No chest pain, sob. No melanotic stools today. Hemoglobin is 8.8  Objective: Filed Vitals:   06/20/15 0746 06/20/15 1204  BP: 115/59 105/69  Pulse: 100 81  Temp: 99.2 F (37.3 C) 98.2 F (36.8 C)  Resp: 16 14    Intake/Output Summary (Last 24 hours) at 06/20/15 1743 Last data filed at 06/20/15 0021  Gross per 24 hour  Intake    675 ml  Output    821 ml  Net   -146 ml   Filed Weights   06/16/15 1509 06/18/15 1400 06/20/15 0500  Weight: 94.8 kg (208 lb 15.9 oz) 91.6 kg (201 lb 15.1 oz) 87.9 kg (193 lb 12.6 oz)    Exam:   General:  Sitting up on side of bed.  Cardiovascular: RRR  Respiratory: CTAB  Abdomen: Soft, nontender, nondistended, positive bowel sounds.  Musculoskeletal: No clubbing cyanosis or edema.  Data Reviewed: Basic Metabolic Panel:  Recent Labs Lab 06/17/15 0543 06/18/15 0810 06/19/15 0310 06/19/15 1600 06/20/15 0500  NA 140 142 142 144 140  K 5.1 4.3 3.9 4.5 4.5  CL 103 104 104 105 105  CO2 21* 21* 22 24 23   GLUCOSE 111* 147* 223* 93 98  BUN 149* 143* 134* 113* 92*  CREATININE 26.64* 22.64* 22.20* 20.64* 17.38*  CALCIUM 7.6* 8.4* 8.5* 8.2*  7.7* 8.4*  PHOS 8.8* 8.0* 7.6* 8.7* 7.3*   Liver Function Tests:  Recent Labs Lab 06/18/15 0810 06/19/15 0310  06/19/15 1600 06/20/15 0307 06/20/15 0500  AST  --   --   --  34  --   ALT  --   --   --  31  --   ALKPHOS  --   --   --  57  --   BILITOT  --   --   --  0.4  --   PROT  --   --   --  6.5  --   ALBUMIN 2.7* 2.7* 2.7* 2.7* 2.7*   No results for input(s): LIPASE, AMYLASE in the last 168 hours. No results for input(s): AMMONIA in the last 168 hours. CBC:  Recent Labs Lab 06/18/15 0810  06/19/15 0310 06/19/15 1600 06/19/15 1807 06/20/15 0307 06/20/15 0857 06/20/15 1447  WBC 10.3  --  10.2 11.2* 13.0* 15.7*  --   --   HGB 7.6*  < > 7.5* 7.3* 8.6* 9.6* 8.8* 8.8*  HCT 21.3*  < > 22.4* 21.2* 24.8* 29.0* 26.0* 26.9*  MCV 83.5  --  86.5 88.3 88.3 89.5  --   --   PLT 164  --  171 169 191 144*  --   --   < > = values in this interval not displayed. Cardiac Enzymes:  Recent  Labs Lab 06/16/15 1352 06/16/15 1910 06/17/15 0543 06/17/15 1211 06/17/15 2343  TROPONINI 0.10* 0.07* 0.08* 0.08* 0.10*   BNP (last 3 results) No results for input(s): BNP in the last 8760 hours.  ProBNP (last 3 results) No results for input(s): PROBNP in the last 8760 hours.  CBG:  Recent Labs Lab 06/19/15 1959 06/19/15 2346 06/20/15 0427 06/20/15 0744 06/20/15 1213  GLUCAP 101* 103* 96 104* 103*    Recent Results (from the past 240 hour(s))  MRSA PCR Screening     Status: None   Collection Time: 06/16/15  3:47 PM  Result Value Ref Range Status   MRSA by PCR NEGATIVE NEGATIVE Final    Comment:        The GeneXpert MRSA Assay (FDA approved for NASAL specimens only), is one component of a comprehensive MRSA colonization surveillance program. It is not intended to diagnose MRSA infection nor to guide or monitor treatment for MRSA infections.      Studies: Ct Head Wo Contrast  06/20/2015  CLINICAL DATA:  Confusion. EXAM: CT HEAD WITHOUT CONTRAST TECHNIQUE: Contiguous axial images were obtained from the base of the skull through the vertex without contrast. COMPARISON:  None FINDINGS:  Mildly ill-defined low density involving the right frontal-temporal lobe. This area measures up 3.7 cm in the AP dimension. There is a small amount of sulcal effacement surrounding this hypodensity. There is no evidence for midline shift or hydronephrosis. No evidence for acute hemorrhage. There is a hyperdense MCA sign in the right sylvian fissure. Visualized mastoid air cells are clear. Minimal mucosal disease in the left maxillary sinus. Otherwise, the visualized paranasal sinuses are clear. No calvarial fracture. Focal low-density structure in the left parietal-occipital region could represent an old insult or infarct. IMPRESSION: There is a large low-density area occupying the right frontal and temporal lobes suggestive for an infarct. There is thrombus in the right MCA at the right sylvian fissure and a small amount of sulci effacement around the infarct. Findings are suggestive for early subacute infarct in the right MCA territory. No evidence for acute hemorrhage or hemorrhagic transformation. These results were called by telephone at the time of interpretation on 06/20/2015 at 12:25 pm to the patient's nurse, Vicente Males, who verbally acknowledged these results. Electronically Signed   By: Markus Daft M.D.   On: 06/20/2015 12:33   Mr Jodene Nam Head Wo Contrast  06/20/2015  CLINICAL DATA:  Sudden onset of dizziness and increased confusion. Left-sided facial droop. Personal history of end-stage renal disease, hypertension, diabetes. The examination had to be discontinued prior to completion due to patient refusal low tear the holes left. EXAM: MRI HEAD WITHOUT CONTRAST MRA HEAD WITHOUT CONTRAST TECHNIQUE: Multiplanar, multiecho pulse sequences of the brain and surrounding structures were obtained without intravenous contrast. Angiographic images of the head were obtained using MRA technique without contrast. COMPARISON:  CT head without contrast from the same day. FINDINGS: MRI HEAD FINDINGS Acute nonhemorrhagic  infarct is evident within the right frontal operculum, insular cortex, right lentiform nucleus, and superior temporal gyrus. There is associated T2 signal change and mass effect with partial effacement of the right lateral ventricle. Midline shift of 1-2 mm is noted. Mild periventricular white matter changes are noted bilaterally. There is focal T2 signal abnormality within the left coronal radiata. There well-formed cystic lesion is present in the left occipital lobe. No blood products are present. The internal auditory canals are within normal limits bilaterally. The brainstem is within normal limits. Remote lacunar infarcts are present  in the cerebellum bilaterally. Flow is present in the major intracranial arteries. Bilateral lens replacements are present. The paranasal sinuses are clear. The globes and orbits are otherwise intact. The skullbase is within normal limits. Midline sagittal images are within normal limits. MRA HEAD FINDINGS There is marked attenuation of several M3 branch vessels on the right associated with the infarct. No proximal stenosis or occlusion is present. The left MCA branch vessels are intact. ACA branch vessels are normal bilaterally. The right vertebral artery is the dominant vessel. The right PICA origin is visualized and normal. AICA vessels are noted bilaterally. The basilar artery is normal. Both posterior cerebral arteries originate from the basilar tip. The PCA branch vessels are intact. IMPRESSION: 1. Acute nonhemorrhagic infarct involving the right frontal operculum, insular cortex, right lentiform nucleus, and superior temporal gyrus. 2. Marked attenuation multiple M3 branch vessels associated with the infarct. 3. Focal mass effect with 1 - 2 mm midline shift and partial effacement of the right lateral ventricle. Mass effect is somewhat atypical. No discrete mass lesion is present. Follow-up MRI in 1-2 months is recommended to assure no underlying mass lesion and expected  evolution of the infarct. 4. Age advanced periventricular white matter disease reflects chronic microvascular ischemia. 5. Focal age advanced white matter changes within the left corona radiata. 6. Remote lacunar infarcts in the cerebellum bilaterally. Electronically Signed   By: San Morelle M.D.   On: 06/20/2015 17:01   Mr Brain Wo Contrast  06/20/2015  CLINICAL DATA:  Sudden onset of dizziness and increased confusion. Left-sided facial droop. Personal history of end-stage renal disease, hypertension, diabetes. The examination had to be discontinued prior to completion due to patient refusal low tear the holes left. EXAM: MRI HEAD WITHOUT CONTRAST MRA HEAD WITHOUT CONTRAST TECHNIQUE: Multiplanar, multiecho pulse sequences of the brain and surrounding structures were obtained without intravenous contrast. Angiographic images of the head were obtained using MRA technique without contrast. COMPARISON:  CT head without contrast from the same day. FINDINGS: MRI HEAD FINDINGS Acute nonhemorrhagic infarct is evident within the right frontal operculum, insular cortex, right lentiform nucleus, and superior temporal gyrus. There is associated T2 signal change and mass effect with partial effacement of the right lateral ventricle. Midline shift of 1-2 mm is noted. Mild periventricular white matter changes are noted bilaterally. There is focal T2 signal abnormality within the left coronal radiata. There well-formed cystic lesion is present in the left occipital lobe. No blood products are present. The internal auditory canals are within normal limits bilaterally. The brainstem is within normal limits. Remote lacunar infarcts are present in the cerebellum bilaterally. Flow is present in the major intracranial arteries. Bilateral lens replacements are present. The paranasal sinuses are clear. The globes and orbits are otherwise intact. The skullbase is within normal limits. Midline sagittal images are within normal  limits. MRA HEAD FINDINGS There is marked attenuation of several M3 branch vessels on the right associated with the infarct. No proximal stenosis or occlusion is present. The left MCA branch vessels are intact. ACA branch vessels are normal bilaterally. The right vertebral artery is the dominant vessel. The right PICA origin is visualized and normal. AICA vessels are noted bilaterally. The basilar artery is normal. Both posterior cerebral arteries originate from the basilar tip. The PCA branch vessels are intact. IMPRESSION: 1. Acute nonhemorrhagic infarct involving the right frontal operculum, insular cortex, right lentiform nucleus, and superior temporal gyrus. 2. Marked attenuation multiple M3 branch vessels associated with the infarct. 3. Focal mass  effect with 1 - 2 mm midline shift and partial effacement of the right lateral ventricle. Mass effect is somewhat atypical. No discrete mass lesion is present. Follow-up MRI in 1-2 months is recommended to assure no underlying mass lesion and expected evolution of the infarct. 4. Age advanced periventricular white matter disease reflects chronic microvascular ischemia. 5. Focal age advanced white matter changes within the left corona radiata. 6. Remote lacunar infarcts in the cerebellum bilaterally. Electronically Signed   By: San Morelle M.D.   On: 06/20/2015 17:01   Dg Chest Port 1 View  06/19/2015  CLINICAL DATA:  Status post dialysis catheter placement today. Initial encounter. EXAM: PORTABLE CHEST 1 VIEW COMPARISON:  PA and lateral chest 06/15/2015. FINDINGS: Right IJ approach dialysis catheter is in place with the tip projecting within the right atrium. No pneumothorax. Right effusion basilar airspace disease have markedly improved. The left lung is clear. There is cardiomegaly. IMPRESSION: Tip of dialysis catheter projects in the right atrium. Negative for pneumothorax. Improved right pleural effusion and basilar airspace disease. Electronically  Signed   By: Inge Rise M.D.   On: 06/19/2015 15:02   Dg Fluoro Guide Cv Line-no Report  06/19/2015  CLINICAL DATA:  FLOURO GUIDE CV LINE Fluoroscopy was utilized by the requesting physician.  No radiographic interpretation.    Scheduled Meds: .  stroke: mapping our early stages of recovery book   Does not apply Once  . sodium chloride   Intravenous Once  . sodium chloride   Intravenous Once  . acetaminophen  650 mg Oral Once  . acetaminophen  650 mg Oral Once  . calcium acetate  667 mg Oral TID WC  . carvedilol  12.5 mg Oral BID WC  . ceFAZolin  2 g Other On Call to OR  . [START ON 06/25/2015] darbepoetin (ARANESP) injection - DIALYSIS  200 mcg Intravenous Q Mon-HD  . digoxin  0.125 mg Oral QODAY  . diphenhydrAMINE  25 mg Oral Once  . diphenhydrAMINE  25 mg Oral Once  . gentamicin cream  1 application Topical Daily  . heparin  40 Units/kg Dialysis Once in dialysis  . insulin aspart  0-9 Units Subcutaneous 6 times per day  . isosorbide-hydrALAZINE  1 tablet Oral TID  . lanthanum  1,000 mg Oral TID WC  . multivitamin  1 tablet Oral Daily  . pantoprazole (PROTONIX) IV  40 mg Intravenous Q12H  . pravastatin  20 mg Oral QPM  . sodium chloride  3 mL Intravenous Q12H   Continuous Infusions:    Principal Problem:   Uremia Active Problems:   Hypoxia   Acute respiratory failure (HCC)   ESRD on peritoneal dialysis (HCC)   Diabetes mellitus without complication (HCC)   Essential hypertension   Pleuritic chest pain   SOB (shortness of breath)   Hematemesis   Hematemesis with nausea   ESRD (end stage renal disease) on dialysis (HCC)   Right leg pain   Gastrointestinal hemorrhage with melena    Time spent: Woodridge MD [facility] Pager [phone]. If 7PM-7AM, please contact night-coverage at [email or web address], password [facility] 06/20/2015, 5:43 PM  LOS: 4 days

## 2015-06-20 NOTE — Telephone Encounter (Signed)
-----   Message from Mena Goes, RN sent at 06/19/2015  2:27 PM EST ----- Regarding: schedule   ----- Message -----    From: Gabriel Earing, PA-C    Sent: 06/19/2015   2:09 PM      To: [facility] Charge Pool  S/p left BC AVF 06/19/15.  F/u with Dr. Oneida Alar in 6 weeks with duplex.  Thanks, Aldona Bar

## 2015-06-20 NOTE — Progress Notes (Signed)
George [name] 11:22 AM  Subjective: Patient without any specific complaints still very sleepy and case discussed with his sister is well and he is willing to have his endoscopy  Objective: Vital signs stable afebrile no acute distress lungs clear regular rate and rhythm abdomen is soft minimal discomfort good bowel sounds hemoglobin okay  Assessment: GI bleeding  Plan: I rediscussed endoscopy with the patient and his sister and they're in agreement and understand my partner will proceed at 1 PM today  [facility]  Pager [phone] After 5PM or if no answer call [phone]

## 2015-06-20 NOTE — Progress Notes (Signed)
Subjective: Interval History: has no complaint .  Objective: Vital signs in last 24 hours: Temp:  [97.1 F (36.2 C)-98.7 F (37.1 C)] 98.2 F (36.8 C) (12/21 0413) Pulse Rate:  [68-89] 87 (12/21 0413) Resp:  [11-28] 21 (12/21 0600) BP: (78-165)/(47-99) 133/90 mmHg (12/21 0600) SpO2:  [94 %-100 %] 97 % (12/21 0551) Weight:  [87.9 kg (193 lb 12.6 oz)] 87.9 kg (193 lb 12.6 oz) (12/21 0500) Weight change: -3.7 kg (-8 lb 2.5 oz)  Intake/Output from previous day: 12/20 0701 - 12/21 0700 In: [id number] [I.V.:425; Blood:675] Out: 21686 [Blood:30] Intake/Output this shift:    General appearance: slowed mentation and somnolent but cooperative Resp: diminished breath sounds bilaterally Chest wall: LIJ cath Cardio: S1, S2 normal and systolic murmur: holosystolic 2/6, blowing at apex GI: pos bs, soft, liver down 5 cm Extremities: AVF LUA  Lab Results:  Recent Labs  06/19/15 1807 06/20/15 0307  WBC 13.0* 15.7*  HGB 8.6* 9.6*  HCT 24.8* 29.0*  PLT 191 144*   BMET:  Recent Labs  06/19/15 1600 06/20/15 0500  NA 144 140  K 4.5 4.5  CL 105 105  CO2 24 23  GLUCOSE 93 98  BUN 113* 92*  CREATININE 20.64* 17.38*  CALCIUM 8.2* 8.4*   No results for input(s): PTH in the last 72 hours. Iron Studies: No results for input(s): IRON, TIBC, TRANSFERRIN, FERRITIN in the last 72 hours.  Studies/Results: Dg Chest Port 1 View  06/19/2015  CLINICAL DATA:  Status post dialysis catheter placement today. Initial encounter. EXAM: PORTABLE CHEST 1 VIEW COMPARISON:  PA and lateral chest 06/15/2015. FINDINGS: Right IJ approach dialysis catheter is in place with the tip projecting within the right atrium. No pneumothorax. Right effusion basilar airspace disease have markedly improved. The left lung is clear. There is cardiomegaly. IMPRESSION: Tip of dialysis catheter projects in the right atrium. Negative for pneumothorax. Improved right pleural effusion and basilar airspace disease. Electronically  Signed   By: Inge Rise M.D.   On: 06/19/2015 15:02   Dg Fluoro Guide Cv Line-no Report  06/19/2015  CLINICAL DATA:  FLOURO GUIDE CV LINE Fluoroscopy was utilized by the requesting physician.  No radiographic interpretation.    I have reviewed the patient's current medications.  Assessment/Plan: 1 ESRD 1st HD yest. 2 HPTH vit D 3 Anemia stable 4 GIB  Suspect melanotic stools were blood sitting in intestine, HB stable 5 HTN lower.  Lower meds 6 AMS  Still somnolent   ? Other events  CT may be useful P HD in am, follow Hb, CT    LOS: 4 days   Cote Mayabb L 06/20/2015,7:42 AM

## 2015-06-20 NOTE — Progress Notes (Signed)
Md was informed about the result of head CT.

## 2015-06-21 ENCOUNTER — Inpatient Hospital Stay (HOSPITAL_COMMUNITY): Payer: Medicare Other

## 2015-06-21 ENCOUNTER — Encounter (HOSPITAL_COMMUNITY): Payer: Self-pay | Admitting: *Deleted

## 2015-06-21 ENCOUNTER — Encounter (HOSPITAL_COMMUNITY)
Admission: EM | Disposition: A | Discharge status: Home / Self Care | Payer: Self-pay | Source: Home / Self Care | Attending: Internal Medicine

## 2015-06-21 DIAGNOSIS — I63131 Cerebral infarction due to embolism of right carotid artery: Secondary | ICD-10-CM

## 2015-06-21 DIAGNOSIS — R410 Disorientation, unspecified: Secondary | ICD-10-CM

## 2015-06-21 HISTORY — PX: ESOPHAGOGASTRODUODENOSCOPY: SHX5428

## 2015-06-21 LAB — RENAL FUNCTION PANEL
ALBUMIN: 2.4 g/dL — AB (ref 3.5–5.0)
ANION GAP: 15 (ref 5–15)
BUN: 107 mg/dL — ABNORMAL HIGH (ref 6–20)
CALCIUM: 8.3 mg/dL — AB (ref 8.9–10.3)
CO2: 22 mmol/L (ref 22–32)
Chloride: 101 mmol/L (ref 101–111)
Creatinine, Ser: 20.61 mg/dL — ABNORMAL HIGH (ref 0.61–1.24)
GFR calc non Af Amer: 2 mL/min — ABNORMAL LOW (ref >60)
GFR, EST AFRICAN AMERICAN: 3 mL/min — AB (ref >60)
Glucose, Bld: 99 mg/dL (ref 65–99)
PHOSPHORUS: 9.6 mg/dL — AB (ref 2.5–4.6)
Potassium: 4.9 mmol/L (ref 3.5–5.1)
SODIUM: 138 mmol/L (ref 135–145)

## 2015-06-21 LAB — CBC
HCT: 25.6 % — ABNORMAL LOW (ref 39.0–52.0)
HEMOGLOBIN: 8.5 g/dL — AB (ref 13.0–17.0)
MCH: 30 pg (ref 26.0–34.0)
MCHC: 33.2 g/dL (ref 30.0–36.0)
MCV: 90.5 fL (ref 78.0–100.0)
Platelets: 163 10*3/uL (ref 150–400)
RBC: 2.83 MIL/uL — AB (ref 4.22–5.81)
RDW: 15.8 % — ABNORMAL HIGH (ref 11.5–15.5)
WBC: 13 10*3/uL — ABNORMAL HIGH (ref 4.0–10.5)

## 2015-06-21 LAB — LIPID PANEL
CHOL/HDL RATIO: 5.4 ratio
CHOLESTEROL: 91 mg/dL (ref 0–200)
HDL: 17 mg/dL — ABNORMAL LOW (ref >40)
LDL Cholesterol: 53 mg/dL (ref 0–99)
TRIGLYCERIDES: 107 mg/dL (ref <150)
VLDL: 21 mg/dL (ref 0–40)

## 2015-06-21 LAB — GLUCOSE, CAPILLARY
GLUCOSE-CAPILLARY: 122 mg/dL — AB (ref 65–99)
GLUCOSE-CAPILLARY: 84 mg/dL (ref 65–99)
Glucose-Capillary: 91 mg/dL (ref 65–99)
Glucose-Capillary: 98 mg/dL (ref 65–99)

## 2015-06-21 SURGERY — EGD (ESOPHAGOGASTRODUODENOSCOPY)
Anesthesia: Moderate Sedation

## 2015-06-21 MED ORDER — SODIUM CHLORIDE 0.9 % IV SOLN
INTRAVENOUS | Status: DC
Start: 2015-06-21 — End: 2015-06-21

## 2015-06-21 MED ORDER — HEPARIN SODIUM (PORCINE) 1000 UNIT/ML DIALYSIS: 1000.0000 [IU] | INTRAMUSCULAR | Status: DC | PRN

## 2015-06-21 MED ORDER — LIDOCAINE HCL (PF) 1 % IJ SOLN: 5.0000 mL | INTRAMUSCULAR | Status: DC | PRN

## 2015-06-21 MED ORDER — ALTEPLASE 2 MG IJ SOLR
2.0000 mg | Freq: Once | INTRAMUSCULAR | Status: DC | PRN
  Filled 2015-06-21: qty 2

## 2015-06-21 MED ORDER — SODIUM CHLORIDE 0.9 % IV SOLN: 100.0000 mL | INTRAVENOUS | Status: DC | PRN

## 2015-06-21 MED ORDER — LIDOCAINE-PRILOCAINE 2.5-2.5 % EX CREA
1.0000 "application " | TOPICAL_CREAM | CUTANEOUS | Status: DC | PRN
  Filled 2015-06-21: qty 5

## 2015-06-21 MED ORDER — FENTANYL CITRATE (PF) 100 MCG/2ML IJ SOLN
INTRAMUSCULAR | Status: AC
  Filled 2015-06-21: qty 2

## 2015-06-21 MED ORDER — FENTANYL CITRATE (PF) 100 MCG/2ML IJ SOLN
INTRAMUSCULAR | Status: DC | PRN
  Administered 2015-06-21 (×2): 25 ug via INTRAVENOUS

## 2015-06-21 MED ORDER — DIPHENHYDRAMINE HCL 50 MG/ML IJ SOLN
INTRAMUSCULAR | Status: AC
  Filled 2015-06-21: qty 1

## 2015-06-21 MED ORDER — MIDAZOLAM HCL 10 MG/2ML IJ SOLN
INTRAMUSCULAR | Status: DC | PRN
  Administered 2015-06-21 (×2): 2 mg via INTRAVENOUS

## 2015-06-21 MED ORDER — MIDAZOLAM HCL 5 MG/ML IJ SOLN
INTRAMUSCULAR | Status: AC
  Filled 2015-06-21: qty 2

## 2015-06-21 MED ORDER — PENTAFLUOROPROP-TETRAFLUOROETH EX AERO: 1.0000 "application " | INHALATION_SPRAY | CUTANEOUS | Status: DC | PRN

## 2015-06-21 NOTE — Evaluation (Signed)
Speech Language Pathology Evaluation Patient Details Name: George [name] MRN: [id number] DOB: 26-Apr-1971 Today's Date: 06/21/2015 Time: [id number] SLP Time Calculation (min) (ACUTE ONLY): 18 min  Problem List:  Patient Active Problem List   Diagnosis Date Noted  . ESRD (end stage renal disease) on dialysis ([place])   . Right leg pain   . Gastrointestinal hemorrhage with melena   . Hematemesis with nausea   . Hematemesis 06/17/2015  . Uremia 06/17/2015  . Hypoxia 06/16/2015  . Acute respiratory failure ([place]) 06/16/2015  . ESRD on peritoneal dialysis ([place]) 06/16/2015  . Diabetes mellitus without complication ([place]) [id number]  . Essential hypertension 06/16/2015  . Pleuritic chest pain 06/16/2015  . Pain in the chest   . Hyperkalemia   . High anion gap metabolic acidosis   . SOB (shortness of breath)    Past Medical History:  Past Medical History  Diagnosis Date  . Diabetes mellitus without complication ([place])   . Hypertension   . Renal disorder    Past Surgical History:  Past Surgical History  Procedure Laterality Date  . Peritoneal dialysis catheter placed      around 2015 in [place]  . Insertion of dialysis catheter Left 06/19/2015    Procedure: INSERTION OF DIALYSIS CATHETER LEFT INTERNAL JUGULAR;  Surgeon: Elam Dutch, MD;  Location: [facility];  Service: Vascular;  Laterality: Left;  . Av fistula placement Left 06/19/2015    Procedure: LEFT ARTERIOVENOUS (AV) FISTULA CREATION;  Surgeon: Elam Dutch, MD;  Location: [facility];  Service: Vascular;  Laterality: Left;   HPI:  George [name] is a 44 y.o. male with a history of ESRD 2/2 HTN, DM, HTN current smoker who presented with SOB and CP and found to be very uremic. He also endorses that he had sudden onset dizziness, and increased confusion in the middle of last week. MRI shows acute infarct involving the right frontal operculum, insular cortex, right lentiform nucleus, and superior temporal gyrus. Remote lacunar infarcts in  the cerebellum ntoed bilaterally.   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 He also shows a right-sided gaze preference and left-sided inattention, needing Min cues to locate items in his left visual field. Cognitively, he has some additional, mild impairments with higher level skills such as emergent/anticipatory awareness, complex problem solving, and alternating/divided attention. He will benefit from additional SLP flu, both acutely and OP, to maximize communication and functional independence.    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  Overall Cognitive Status: Within Functional Limits for tasks assessed Arousal/Alertness: Awake/alert Orientation Level: Oriented X4 Attention: Alternating;Divided Alternating Attention: Impaired Alternating Attention Impairment: Functional basic;Verbal basic Divided Attention: Impaired Divided Attention Impairment: Functional basic;Verbal basic Memory: Impaired Memory Impairment: Other (comment) (working memory) Awareness: Impaired Awareness Impairment: Emergent impairment;Anticipatory impairment Problem Solving: Impaired Problem Solving Impairment: Functional complex Behaviors: Impulsive Safety/Judgment: Appears intact    Comprehension  Auditory Comprehension Overall Auditory Comprehension: Impaired Yes/No Questions: Within Functional  Limits Commands: Impaired One Step Basic Commands: 75-100% accurate  Two Step Basic Commands: 75-100% accurate Complex Commands: 50-74% accurate Interfering Components: Processing speed    Expression Expression Primary Mode of Expression: Verbal Verbal Expression Overall Verbal Expression: Appears within functional limits for tasks assessed Written Expression Dominant Hand: Right   Oral / Motor Oral Motor/Sensory Function Overall Oral Motor/Sensory Function: Moderate impairment Facial ROM: Reduced left;Suspected CN VII (facial) dysfunction Facial Symmetry: Abnormal symmetry left;Suspected CN VII (facial) dysfunction Facial Strength: Reduced left;Suspected CN VII (facial) dysfunction Lingual ROM: Within Functional Limits Lingual Symmetry: Abnormal symmetry left;Suspected CN XII (hypoglossal) dysfunction Motor Speech Overall Motor Speech: Impaired Phonation: Low vocal intensity Articulation: Impaired Level of Impairment: Word Intelligibility: Intelligibility reduced Word: 75-100% accurate Motor Planning: Witnin functional limits Motor Speech Errors: Not applicable Effective Techniques: Increased vocal intensity    Germain Osgood, M.A. CCC-SLP [phone]  Germain Osgood 06/21/2015, 11:42 AM

## 2015-06-21 NOTE — Op Note (Signed)
[facility] [place], [id number]   ENDOSCOPY PROCEDURE REPORT  PATIENT: [name], George  MR#: [id number] BIRTHDATE: 04-Feb-1971 , 44  yrs. old GENDER: male ENDOSCOPIST:Dearion Huot Oletta Lamas, MD REFERRED BY: [facility] PROCEDURE DATE:  06/21/2015 PROCEDURE:   EGD ASA CLASS:    class III INDICATIONS: ? Of hematemesis and patient with chronic renal failure MEDICATION: fentanyl 50 g versed 4 mg IV TOPICAL ANESTHETIC:   cetacaine spray  DESCRIPTION OF PROCEDURE:   After the risks and benefits of the procedure were explained, informed consent was obtained.  The endoscope was introduced through the mouth  and advanced to the second portion of the duodenum .  The instrument was slowly withdrawn as the mucosa was fully examined. Estimated blood loss is zero unless otherwise noted in this procedure report. The duodenum including the bulb and 2nd portion was seen well. The scope was withdrawn back into the stomach and the poor channel and antrum were normal. The remainder of the stomach was examined in the forward and retro flex view and was normal. The patient had a widely Ria Clock GE junction and a hiatal hernia with some very mild esophagitis with a few linear erosions. Remainder of the esophagus was not remarkable. The scope was withdrawn he tolerated the procedure well    The scope was then withdrawn from the patient and the procedure completed.  COMPLICATIONS: There were no immediate complications.  ENDOSCOPIC IMPRESSION: 1. Mild Erosive Esophagitis. No other significant findings RECOMMENDATIONS: would treat the patient with PPI therapy. He could be anticoagulated as needed. We'll be happy to see him back again as needed   _______________________________ eSigned:  Laurence Spates, MD 06/21/2015 1:48 PM     ccDr. Deterding  CPT CODES: ICD CODES:  The ICD and CPT codes recommended by this software are interpretations from the data  that the clinical staff has captured with the software.  The verification of the translation of this report to the ICD and CPT codes and modifiers is the sole responsibility of the health care institution and practicing physician where this report was generated.  [employer] will not be held responsible for the validity of the ICD and CPT codes included on this report.  AMA assumes no liability for data contained or not contained herein. CPT is a Designer, television/film set of the [employer].  PATIENT NAME:  [name], George MR#: [id number]

## 2015-06-21 NOTE — Evaluation (Signed)
Clinical/Bedside Swallow Evaluation Patient Details  Name: George [name] MRN: [id number] Date of Birth: February 04, 1971  Today's Date: 06/21/2015 Time: SLP Start Time (ACUTE ONLY): 0936 SLP Stop Time (ACUTE ONLY): 0954 SLP Time Calculation (min) (ACUTE ONLY): 18 min  Past Medical History:  Past Medical History  Diagnosis Date  . Diabetes mellitus without complication ([place])   . Hypertension   . Renal disorder    Past Surgical History:  Past Surgical History  Procedure Laterality Date  . Peritoneal dialysis catheter placed      around 2015 in [place]  . Insertion of dialysis catheter Left 06/19/2015    Procedure: INSERTION OF DIALYSIS CATHETER LEFT INTERNAL JUGULAR;  Surgeon: Elam Dutch, MD;  Location: [facility];  Service: Vascular;  Laterality: Left;  . Av fistula placement Left 06/19/2015    Procedure: LEFT ARTERIOVENOUS (AV) FISTULA CREATION;  Surgeon: Elam Dutch, MD;  Location: [facility];  Service: Vascular;  Laterality: Left;   HPI:  George [name] is a 44 y.o. male with a history of ESRD 2/2 HTN, DM, HTN current smoker who presented with SOB and CP and found to be very uremic. He also endorses that he had sudden onset dizziness, and increased confusion in the middle of last week. MRI shows acute infarct involving the right frontal operculum, insular cortex, right lentiform nucleus, and superior temporal gyrus. Remote lacunar infarcts in the cerebellum ntoed bilaterally.   Assessment / Plan / Recommendation Clinical Impression  Pt has CN VII and XII impairment with probable CN X involvement as well given weak phonation and volitional cough. He has frequent left-sided anterior loss of thin liquids, including his saliva at rest. Mod cues provided for awareness. Despite the above, only one cough was noted across entire clear liquid tray, and vocal quality remained unchanged. Pt appears safe with current textures; however, solid POs could not be tested due to upcoming procedure. Given CN  deficits, pt will need SLP f/u to assess for appropriate solid textures and monitor current diet tolerance.     Aspiration Risk  Mild aspiration risk    Diet Recommendation  Thin liquids   Medication Administration: Whole meds with puree    Other  Recommendations Oral Care Recommendations: Oral care BID   Follow up Recommendations  Outpatient SLP    Frequency and Duration min 2x/week  2 weeks       Prognosis Prognosis for Safe Diet Advancement: Good      Swallow Study   General HPI: George [name] is a 44 y.o. male with a history of ESRD 2/2 HTN, DM, HTN current smoker who presented with SOB and CP and found to be very uremic. He also endorses that he had sudden onset dizziness, and increased confusion in the middle of last week. MRI shows acute infarct involving the right frontal operculum, insular cortex, right lentiform nucleus, and superior temporal gyrus. Remote lacunar infarcts in the cerebellum ntoed bilaterally. Type of Study: Bedside Swallow Evaluation Previous Swallow Assessment: none in chart Diet Prior to this Study: Thin liquids Temperature Spikes Noted: No Respiratory Status: Room air History of Recent Intubation: No Behavior/Cognition: Alert;Cooperative;Pleasant mood Oral Cavity Assessment: Within Functional Limits Oral Care Completed by SLP: No Oral Cavity - Dentition: Adequate natural dentition Vision: Functional for self-feeding Self-Feeding Abilities: Able to feed self Patient Positioning: Upright in chair Baseline Vocal Quality: Low vocal intensity;Suspected CN X (Vagus) involvement Volitional Cough: Weak    Oral/Motor/Sensory Function Overall Oral Motor/Sensory Function: Moderate impairment Facial ROM: Reduced left;Suspected CN VII (facial)  dysfunction Facial Symmetry: Abnormal symmetry left;Suspected CN VII (facial) dysfunction Facial Strength: Reduced left;Suspected CN VII (facial) dysfunction Lingual ROM: Within Functional Limits Lingual Symmetry:  Abnormal symmetry left;Suspected CN XII (hypoglossal) dysfunction   Ice Chips Ice chips: Not tested   Thin Liquid Thin Liquid: Impaired Presentation: Cup;Self Fed;Straw;Spoon Oral Phase Impairments: Reduced labial seal;Poor awareness of bolus Oral Phase Functional Implications: Left anterior spillage Pharyngeal  Phase Impairments: Suspected delayed Swallow;Cough - Immediate (cough x1)    Nectar Thick Nectar Thick Liquid: Not tested   Honey Thick Honey Thick Liquid: Not tested   Puree Puree: Not tested   Solid Solid: Not tested      Germain Osgood, M.A. CCC-SLP [phone]  Germain Osgood 06/21/2015,11:15 AM

## 2015-06-21 NOTE — Progress Notes (Signed)
[facility] ROUNDING NOTE   Subjective:   Interval History: somnolent   Objective:  Vital signs in last 24 hours:  Temp:  [97.8 F (36.6 C)-99.1 F (37.3 C)] 98.4 F (36.9 C) (12/22 1958) Pulse Rate:  [87-109] 97 (12/22 1958) Resp:  [15-22] 19 (12/22 1958) BP: (117-153)/(62-91) 144/89 mmHg (12/22 1958) SpO2:  [93 %-100 %] 99 % (12/22 1958) Weight:  [86.6 kg (190 lb 14.7 oz)-90.5 kg (199 lb 8.3 oz)] 86.6 kg (190 lb 14.7 oz) (12/22 1907)  Weight change: 1.9 kg (4 lb 3 oz) Filed Weights   06/21/15 0420 06/21/15 1546 06/21/15 1907  Weight: 89.8 kg (197 lb 15.6 oz) 90.5 kg (199 lb 8.3 oz) 86.6 kg (190 lb 14.7 oz)    Intake/Output: I/O last 3 completed shifts: In: 323 [I.V.:323] Out: -    Intake/Output this shift:  Total I/O In: -  Out: 1500 [Other:1500]  CVS- RRR RS- CTA ABD- BS present soft non-distended EXT- no edema   Basic Metabolic Panel:  Recent Labs Lab 06/18/15 0810 06/19/15 0310 06/19/15 1600 06/20/15 0500 06/21/15 0241  NA 142 142 144 140 138  K 4.3 3.9 4.5 4.5 4.9  CL 104 104 105 105 101  CO2 21* 22 24 23 22   GLUCOSE 147* 223* 93 98 99  BUN 143* 134* 113* 92* 107*  CREATININE 22.64* 22.20* 20.64* 17.38* 20.61*  CALCIUM 8.4* 8.5* 8.2*  7.7* 8.4* 8.3*  PHOS 8.0* 7.6* 8.7* 7.3* 9.6*    Liver Function Tests:  Recent Labs Lab 06/19/15 0310 06/19/15 1600 06/20/15 0307 06/20/15 0500 06/21/15 0241  AST  --   --  34  --   --   ALT  --   --  31  --   --   ALKPHOS  --   --  57  --   --   BILITOT  --   --  0.4  --   --   PROT  --   --  6.5  --   --   ALBUMIN 2.7* 2.7* 2.7* 2.7* 2.4*   No results for input(s): LIPASE, AMYLASE in the last 168 hours. No results for input(s): AMMONIA in the last 168 hours.  CBC:  Recent Labs Lab 06/19/15 0310 06/19/15 1600 06/19/15 1807 06/20/15 0307 06/20/15 0857 06/20/15 1447 06/20/15 2028 06/21/15 0241  WBC 10.2 11.2* 13.0* 15.7*  --   --   --  13.0*  HGB 7.5* 7.3* 8.6* 9.6* 8.8*  8.8* 8.7* 8.5*  HCT 22.4* 21.2* 24.8* 29.0* 26.0* 26.9* 26.2* 25.6*  MCV 86.5 88.3 88.3 89.5  --   --   --  90.5  PLT 171 169 191 144*  --   --   --  163    Cardiac Enzymes:  Recent Labs Lab 06/16/15 1352 06/16/15 1910 06/17/15 0543 06/17/15 1211 06/17/15 2343  TROPONINI 0.10* 0.07* 0.08* 0.08* 0.10*    BNP: Invalid input(s): POCBNP  CBG:  Recent Labs Lab 06/20/15 1213 06/20/15 2106 06/21/15 0041 06/21/15 0427 06/21/15 1001  GLUCAP 103* 107* 98 55 122*    Microbiology: Results for orders placed or performed during the hospital encounter of 06/15/15  MRSA PCR Screening     Status: None   Collection Time: 06/16/15  3:47 PM  Result Value Ref Range Status   MRSA by PCR NEGATIVE NEGATIVE Final    Comment:        The GeneXpert MRSA Assay (FDA approved for NASAL specimens only), is one component of a comprehensive  MRSA colonization surveillance program. It is not intended to diagnose MRSA infection nor to guide or monitor treatment for MRSA infections.     Coagulation Studies: No results for input(s): LABPROT, INR in the last 72 hours.  Urinalysis: No results for input(s): COLORURINE, LABSPEC, PHURINE, GLUCOSEU, HGBUR, BILIRUBINUR, KETONESUR, PROTEINUR, UROBILINOGEN, NITRITE, LEUKOCYTESUR in the last 72 hours.  Invalid input(s): APPERANCEUR    Imaging: Ct Head Wo Contrast  06/20/2015  CLINICAL DATA:  Confusion. EXAM: CT HEAD WITHOUT CONTRAST TECHNIQUE: Contiguous axial images were obtained from the base of the skull through the vertex without contrast. COMPARISON:  None FINDINGS: Mildly ill-defined low density involving the right frontal-temporal lobe. This area measures up 3.7 cm in the AP dimension. There is a small amount of sulcal effacement surrounding this hypodensity. There is no evidence for midline shift or hydronephrosis. No evidence for acute hemorrhage. There is a hyperdense MCA sign in the right sylvian fissure. Visualized mastoid air cells are  clear. Minimal mucosal disease in the left maxillary sinus. Otherwise, the visualized paranasal sinuses are clear. No calvarial fracture. Focal low-density structure in the left parietal-occipital region could represent an old insult or infarct. IMPRESSION: There is a large low-density area occupying the right frontal and temporal lobes suggestive for an infarct. There is thrombus in the right MCA at the right sylvian fissure and a small amount of sulci effacement around the infarct. Findings are suggestive for early subacute infarct in the right MCA territory. No evidence for acute hemorrhage or hemorrhagic transformation. These results were called by telephone at the time of interpretation on 06/20/2015 at 12:25 pm to the patient's nurse, Vicente Males, who verbally acknowledged these results. Electronically Signed   By: Markus Daft M.D.   On: 06/20/2015 12:33   Mr Jodene Nam Head Wo Contrast  06/20/2015  CLINICAL DATA:  Sudden onset of dizziness and increased confusion. Left-sided facial droop. Personal history of end-stage renal disease, hypertension, diabetes. The examination had to be discontinued prior to completion due to patient refusal low tear the holes left. EXAM: MRI HEAD WITHOUT CONTRAST MRA HEAD WITHOUT CONTRAST TECHNIQUE: Multiplanar, multiecho pulse sequences of the brain and surrounding structures were obtained without intravenous contrast. Angiographic images of the head were obtained using MRA technique without contrast. COMPARISON:  CT head without contrast from the same day. FINDINGS: MRI HEAD FINDINGS Acute nonhemorrhagic infarct is evident within the right frontal operculum, insular cortex, right lentiform nucleus, and superior temporal gyrus. There is associated T2 signal change and mass effect with partial effacement of the right lateral ventricle. Midline shift of 1-2 mm is noted. Mild periventricular white matter changes are noted bilaterally. There is focal T2 signal abnormality within the left  coronal radiata. There well-formed cystic lesion is present in the left occipital lobe. No blood products are present. The internal auditory canals are within normal limits bilaterally. The brainstem is within normal limits. Remote lacunar infarcts are present in the cerebellum bilaterally. Flow is present in the major intracranial arteries. Bilateral lens replacements are present. The paranasal sinuses are clear. The globes and orbits are otherwise intact. The skullbase is within normal limits. Midline sagittal images are within normal limits. MRA HEAD FINDINGS There is marked attenuation of several M3 branch vessels on the right associated with the infarct. No proximal stenosis or occlusion is present. The left MCA branch vessels are intact. ACA branch vessels are normal bilaterally. The right vertebral artery is the dominant vessel. The right PICA origin is visualized and normal. AICA vessels are noted  bilaterally. The basilar artery is normal. Both posterior cerebral arteries originate from the basilar tip. The PCA branch vessels are intact. IMPRESSION: 1. Acute nonhemorrhagic infarct involving the right frontal operculum, insular cortex, right lentiform nucleus, and superior temporal gyrus. 2. Marked attenuation multiple M3 branch vessels associated with the infarct. 3. Focal mass effect with 1 - 2 mm midline shift and partial effacement of the right lateral ventricle. Mass effect is somewhat atypical. No discrete mass lesion is present. Follow-up MRI in 1-2 months is recommended to assure no underlying mass lesion and expected evolution of the infarct. 4. Age advanced periventricular white matter disease reflects chronic microvascular ischemia. 5. Focal age advanced white matter changes within the left corona radiata. 6. Remote lacunar infarcts in the cerebellum bilaterally. Electronically Signed   By: San Morelle M.D.   On: 06/20/2015 17:01   Mr Brain Wo Contrast  06/20/2015  CLINICAL DATA:   Sudden onset of dizziness and increased confusion. Left-sided facial droop. Personal history of end-stage renal disease, hypertension, diabetes. The examination had to be discontinued prior to completion due to patient refusal low tear the holes left. EXAM: MRI HEAD WITHOUT CONTRAST MRA HEAD WITHOUT CONTRAST TECHNIQUE: Multiplanar, multiecho pulse sequences of the brain and surrounding structures were obtained without intravenous contrast. Angiographic images of the head were obtained using MRA technique without contrast. COMPARISON:  CT head without contrast from the same day. FINDINGS: MRI HEAD FINDINGS Acute nonhemorrhagic infarct is evident within the right frontal operculum, insular cortex, right lentiform nucleus, and superior temporal gyrus. There is associated T2 signal change and mass effect with partial effacement of the right lateral ventricle. Midline shift of 1-2 mm is noted. Mild periventricular white matter changes are noted bilaterally. There is focal T2 signal abnormality within the left coronal radiata. There well-formed cystic lesion is present in the left occipital lobe. No blood products are present. The internal auditory canals are within normal limits bilaterally. The brainstem is within normal limits. Remote lacunar infarcts are present in the cerebellum bilaterally. Flow is present in the major intracranial arteries. Bilateral lens replacements are present. The paranasal sinuses are clear. The globes and orbits are otherwise intact. The skullbase is within normal limits. Midline sagittal images are within normal limits. MRA HEAD FINDINGS There is marked attenuation of several M3 branch vessels on the right associated with the infarct. No proximal stenosis or occlusion is present. The left MCA branch vessels are intact. ACA branch vessels are normal bilaterally. The right vertebral artery is the dominant vessel. The right PICA origin is visualized and normal. AICA vessels are noted  bilaterally. The basilar artery is normal. Both posterior cerebral arteries originate from the basilar tip. The PCA branch vessels are intact. IMPRESSION: 1. Acute nonhemorrhagic infarct involving the right frontal operculum, insular cortex, right lentiform nucleus, and superior temporal gyrus. 2. Marked attenuation multiple M3 branch vessels associated with the infarct. 3. Focal mass effect with 1 - 2 mm midline shift and partial effacement of the right lateral ventricle. Mass effect is somewhat atypical. No discrete mass lesion is present. Follow-up MRI in 1-2 months is recommended to assure no underlying mass lesion and expected evolution of the infarct. 4. Age advanced periventricular white matter disease reflects chronic microvascular ischemia. 5. Focal age advanced white matter changes within the left corona radiata. 6. Remote lacunar infarcts in the cerebellum bilaterally. Electronically Signed   By: San Morelle M.D.   On: 06/20/2015 17:01     Medications:   . sodium chloride  40 mL/hr (06/20/15 1922)   . sodium chloride   Intravenous Once  . sodium chloride   Intravenous Once  . acetaminophen  650 mg Oral Once  . acetaminophen  650 mg Oral Once  . calcium acetate  667 mg Oral TID WC  . carvedilol  12.5 mg Oral BID WC  . [START ON 06/25/2015] darbepoetin (ARANESP) injection - DIALYSIS  200 mcg Intravenous Q Mon-HD  . digoxin  0.125 mg Oral QODAY  . diphenhydrAMINE  25 mg Oral Once  . diphenhydrAMINE  25 mg Oral Once  . gentamicin cream  1 application Topical Daily  . heparin  40 Units/kg Dialysis Once in dialysis  . insulin aspart  0-9 Units Subcutaneous 6 times per day  . isosorbide-hydrALAZINE  1 tablet Oral TID  . lanthanum  1,000 mg Oral TID WC  . multivitamin  1 tablet Oral Daily  . pantoprazole (PROTONIX) IV  40 mg Intravenous Q12H  . pravastatin  20 mg Oral QPM  . sodium chloride  3 mL Intravenous Q12H   sodium chloride, sodium chloride, sodium chloride,  acetaminophen **OR** acetaminophen, acetaminophen **OR** acetaminophen, alteplase, calcium carbonate (dosed in mg elemental calcium), camphor-menthol **AND** hydrOXYzine, chlorproMAZINE (THORAZINE) IV, docusate sodium, feeding supplement (NEPRO CARB STEADY), heparin, heparin, heparin, hydrALAZINE, HYDROmorphone (DILAUDID) injection, lidocaine (PF), lidocaine-prilocaine, ondansetron **OR** ondansetron (ZOFRAN) IV, oxyCODONE, pentafluoroprop-tetrafluoroeth, sodium chloride, sorbitol, technetium TC 85M diethylenetriame-pentaacetic acid, zolpidem  Assessment/ Plan:   ESRD- Failed peritoneal dialysis and now transitioning to hemo  ANEMIA- stable at present  GI bleed   MBD- Vit D  HTN/VOL-controlled will challenge dry weight and taper BP meds  ACCESS- new fistula  CVA  Several infarcts appreciate neurology    LOS: 5 Roald Lukacs W @TODAY @8 :13 PM

## 2015-06-21 NOTE — Interval H&P Note (Signed)
History and Physical Interval Note:  06/21/2015 1:20 PM  George [name]  has presented today for surgery, with the diagnosis of Hematemesis  The various methods of treatment have been discussed with the patient and family. After consideration of risks, benefits and other options for treatment, the patient has consented to  Procedure(s): ESOPHAGOGASTRODUODENOSCOPY (EGD) (N/A) as a surgical intervention .  The patient's history has been reviewed, patient examined, no change in status, stable for surgery.  I have reviewed the patient's chart and labs.  Questions were answered to the patient's satisfaction.     Josuha Fontanez JR,Shigeru Lampert L

## 2015-06-21 NOTE — Progress Notes (Signed)
STROKE TEAM PROGRESS NOTE   HISTORY George [name] is a 44 y.o. male with a history of ESRD 2/2 HTN, DM, HTN current smoker who presented 06/15/2015  with SOB and CP and found to be very uremic. He also endorses that he had sudden onset dizziness, and increased confusion in the middle of last week. His Fiance states that at least since Sunday, he has had a left facial droop. He has been drowsy with some dysarthria, but this was attributed to uremia. He had no difficuly with walking. His LKW was unclear, likely the middle of last week, either 12/14 or 12/15. Neurology was consulted 06/20/2015. Patient was not administered TPA secondary to delay in arrival, unclear time of onset.   SUBJECTIVE (INTERVAL HISTORY) His fiance is at the bedside.  Overall he feels his condition is stable. Wife reports that they are from [place] and will be returning day or once he recovers from his stroke. She is asking about Medicaid and coverage in [place].   OBJECTIVE Temp:  [98.2 F (36.8 C)-99.1 F (37.3 C)] 98.5 F (36.9 C) (12/22 0741) Pulse Rate:  [81-109] 109 (12/22 0741) Cardiac Rhythm:  [-] Sinus tachycardia (12/22 0700) Resp:  [14-15] 15 (12/22 0025) BP: (105-151)/(69-89) 151/89 mmHg (12/22 0741) SpO2:  [93 %-99 %] 97 % (12/22 0741) Weight:  [89.8 kg (197 lb 15.6 oz)] 89.8 kg (197 lb 15.6 oz) (12/22 0420)  CBC:  Recent Labs Lab 06/20/15 0307  06/20/15 2028 06/21/15 0241  WBC 15.7*  --   --  13.0*  HGB 9.6*  < > 8.7* 8.5*  HCT 29.0*  < > 26.2* 25.6*  MCV 89.5  --   --  90.5  PLT 144*  --   --  163  < > = values in this interval not displayed.  Basic Metabolic Panel:   Recent Labs Lab 06/20/15 0500 06/21/15 0241  NA 140 138  K 4.5 4.9  CL 105 101  CO2 23 22  GLUCOSE 98 99  BUN 92* 107*  CREATININE 17.38* 20.61*  CALCIUM 8.4* 8.3*  PHOS 7.3* 9.6*    Lipid Panel:     Component Value Date/Time   CHOL 91 06/21/2015 0241   TRIG 107 06/21/2015 0241   HDL 17* 06/21/2015 0241    CHOLHDL 5.4 06/21/2015 0241   VLDL 21 06/21/2015 0241   LDLCALC 53 06/21/2015 0241   HgbA1c:  Lab Results  Component Value Date   HGBA1C 5.4 06/16/2015   Urine Drug Screen: No results found for: LABOPIA, COCAINSCRNUR, LABBENZ, AMPHETMU, THCU, LABBARB    IMAGING  Ct Head Wo Contrast 06/20/2015   There is a large low-density area occupying the right frontal and temporal lobes suggestive for an infarct. There is thrombus in the right MCA at the right sylvian fissure and a small amount of sulci effacement around the infarct. Findings are suggestive for early subacute infarct in the right MCA territory. No evidence for acute hemorrhage or hemorrhagic transformation.   MRI and MRA Brain Wo Contrast 06/20/2015   1. Acute nonhemorrhagic infarct involving the right frontal operculum, insular cortex, right lentiform nucleus, and superior temporal gyrus. 2. Marked attenuation multiple M3 branch vessels associated with the infarct. 3. Focal mass effect with 1 - 2 mm midline shift and partial effacement of the right lateral ventricle. Mass effect is somewhat atypical. No discrete mass lesion is present. Follow-up MRI in 1-2 months is recommended to assure no underlying mass lesion and expected evolution of the infarct. 4. Age  advanced periventricular white matter disease reflects chronic microvascular ischemia. 5. Focal age advanced white matter changes within the left corona radiata. 6. Remote lacunar infarcts in the cerebellum bilaterally.   Dg Chest Port 1 View 06/19/2015  Tip of dialysis catheter projects in the right atrium. Negative for pneumothorax. Improved right pleural effusion and basilar airspace disease.   2D Echocardiogram  - Left ventricle: The cavity size was normal. Wall thickness wasincreased in a pattern of severe LVH. Systolic function wasnormal. The estimated ejection fraction was in the range of 60%to 65%. Doppler parameters are consistent with abnormal leftventricular  relaxation (grade 1 diastolic dysfunction). - Mitral valve: Severe nodular posterior annular calcification.Valve area by continuity equation (using LVOT flow): 2.66 cm^2. - Left atrium: The atrium was moderately dilated. - Atrial septum: No defect or patent foramen ovale was identified.  Bilateral lower extremity venous duplex no DVT or SVT noted in the bilateral lower extremities.   Carotid Doppler   pending    PHYSICAL EXAM Pleasant middle-aged African-American male not in distress. . Afebrile. Head is nontraumatic. Neck is supple without bruit.    Cardiac exam no murmur or gallop. Lungs are clear to auscultation. Distal pulses are well felt. Neurological Exam : Awake alert oriented 3 normal language and mild dysarthria but soft voice. Can be understood with some difficulty. Extraocular movements are full range without nystagmus. Pupils are equal reactive. Fundi were not visualized. Visual fields seem full to bedside confrontational testing. Mild left lower facial weakness. Tongue midline. Motor system exam revealed mild left hemiplegia with 4/5 strength with significant weakness of left grip, intrinsic hand muscles, left hip flexors and ankle dorsiflexors. Deep tendon reflexes are 1+ symmetric except ankle drugs are depressed. Plantars are downgoing. Sensation is preserved bilaterally. Except for diminished sensation over the ankles and toes ASSESSMENT/PLAN Mr. George [name] is a 44 y.o. male with history of ESRD 2/2 HTN, DM, HTN current smoker who initially presented to [facility] with SOB and CP and found to be uremic. During hospitalization He did not receive IV t-PA due to  delay in arrival, unclear time of onset.   Stroke:  Non-dominant right frontal operculum, insular cortex, right lentiform nucleus, and superior temporal gyrus infarct embolic secondary to known atrial fibrillation   MRI  right frontal operculum, insular cortex, right lentiform nucleus, and superior temporal gyrus with atypical  mass effect. small vessel disease. White matter changes L corona radiata. Old B cerebellar lacunes.  MRA  Attenuated M3 vessels associated with infarct.  Carotid Doppler  pending   Lower extremity venous Dopplers negative  2D Echo  No source of embolus   LDL 53  HgbA1c 5.4  SCDs for VTE prophylaxis Diet clear liquid Room service appropriate?: Yes; Fluid consistency:: Thin  aspirin 81 mg daily prior to admission, now on No antithrombotics given GIB. Await GI evaluation prior to resumption of aspirin. Consider anticoagulation. Atrial fibrillation, if/once stable  Ongoing aggressive stroke risk factor management  Follow up MRI in 1-2 mos to evaluate mass effect  Therapy recommendations:  Outpatient PT and OT  Disposition:  pending   Atrial Fibrillation  Home anticoagulation:  None CHA2DS2-VASc Score =  ?2 oral anticoagulation recommended Not an anticoagulation candidate at this time secondary to anemia/bleeding. Reconsider once GI workup completed  Hypertension  Stable  Hyperlipidemia  Home meds:  Pravachol 20, resumed in hospital  LDL 53, goal < 70  Continue statin at discharge  Diabetes  HgbA1c 5.4, at goal < 7.0  Other Stroke Risk Factors  Cigarette smoker, advised to stop smoking  THC use  Family hx stroke (grandmother)  Other Active Problems  UREMIA/UNDER DIALYSIS/END-STAGE RENAL DISEASE ON PERITONEAL DIALYSIS  Hematemesis  Anemia/acute blood loss anemia  Elevated troponins  Shortness of breath  Acute on chronic encephalopathy  Hospital day # [place] for Pager information 06/21/2015 2:07 PM  I have personally examined this patient, reviewed notes, independently viewed imaging studies, participated in medical decision making and plan of care. I have made any additions or clarifications directly to the above note. Agree with note above.  He presented with subacute symptoms of confusion, dizziness,  dysarthria likely right MCA branch infarct of embolic etiology. He remains at risk for neurological worsening, recurrent stroke, TIA needs ongoing stroke evaluation. Patient apparently has remote history of atrial fibrillation but this needs to be verified prior to starting anticoagulation. Also needs GI workup for source of bleeding first. We'll try to get his prior medical records from [place]. Discussed with patient and fianc at the bedside and answered questions. Antony Contras, MD Medical Director [facility] Pager: [phone] 06/21/2015 2:16 PM    To contact Stroke Continuity provider, please refer to [email or web address] After hours, contact General Neurology Following by is

## 2015-06-21 NOTE — Evaluation (Signed)
Physical Therapy Evaluation Patient Details Name: George [name] MRN: [id number] DOB: 1970/10/29 Today's Date: 06/21/2015   History of Present Illness  George [name] is a 44 y.o. male with a history of ESRD 2/2 HTN, DM, HTN current smoker who presented with SOB and CP and found to be very uremic. He also endorses that he had sudden onset dizziness, and increased confusion in the middle of last week. MRI shows acute infarct involving the right frontal operculum, insular cortex, right lentiform nucleus, and superior temporal gyrus. Remote lacunar infarcts in the cerebellum noted bilaterally.   Clinical Impression  Patient presents with decreased mobility due to deficits listed in PT problem list.  Mainly limited by L UE>LE weakness, L facial weakness and difficulty speaking and swallowing.  Will benefit from skilled PT in the acute setting to allow return home with family assist and follow up Outpatient PT.    Follow Up Recommendations Outpatient PT;Supervision/Assistance - 24 hour    Equipment Recommendations  Other (comment) (TBA if needs cane versus walker)    Recommendations for Other Services       Precautions / Restrictions Precautions Precautions: Fall Precaution Comments: L UE>LE weakness      Mobility  Bed Mobility Overal bed mobility: Needs Assistance Bed Mobility: Supine to Sit     Supine to sit: Supervision     General bed mobility comments: assist for IV and line management  Transfers Overall transfer level: Needs assistance   Transfers: Sit to/from Stand;Stand Pivot Transfers Sit to Stand: Min assist Stand pivot transfers: Min assist;+2 safety/equipment       General transfer comment: Demonstrates strength to rise from bed and take pivotal steps to chair with assist for steadying and managing lines; limited assessment with stroke MD in the room and pt wanting to have liquid tray prior to beginning restrictions for afternoon procedure  Ambulation/Gait                 Stairs            Wheelchair Mobility    Modified Rankin (Stroke Patients Only) Modified Rankin (Stroke Patients Only) Pre-Morbid Rankin Score: No symptoms Modified Rankin: Moderately severe disability     Balance Overall balance assessment: Needs assistance   Sitting balance-Leahy Scale: Good       Standing balance-Leahy Scale: Fair Standing balance comment: again limited assessment this date, but noted to have mild imbalance with L LE weakness requiring min A in standing                             Pertinent Vitals/Pain Pain Assessment: No/denies pain    Home Living Family/patient expects to be discharged to:: Private residence Living Arrangements: Spouse/significant other;Other relatives Available Help at Discharge: Family;Available 24 hours/day Type of Home: House Home Access: Stairs to enter Entrance Stairs-Rails: Right;Left;Can reach both Entrance Stairs-Number of Steps: 4 Home Layout: One level Home Equipment: None      Prior Function Level of Independence: Independent         Comments: Lives usually in [place] with fiance', but has been in [place] since their temporary separation and staying with sister; fiance' reports plans to return to [place] when stable     Hand Dominance   Dominant Hand: Right    Extremity/Trunk Assessment   Upper Extremity Assessment: Defer to OT evaluation           Lower Extremity Assessment: RLE deficits/detail;LLE deficits/detail RLE Deficits / Details: [facility]  with mobility bed to chair LLE Deficits / Details: weakness as compared to R, but functionally supports weight for stand step to chair     Communication   Communication: Expressive difficulties (low phonation, CN X involvement)  Cognition Arousal/Alertness: Awake/alert Behavior During Therapy: WFL for tasks assessed/performed Overall Cognitive Status: Within Functional Limits for tasks assessed                       General Comments      Exercises        Assessment/Plan    PT Assessment Patient needs continued PT services  PT Diagnosis Abnormality of gait;Hemiplegia non-dominant side   PT Problem List Decreased strength;Decreased balance;Decreased knowledge of use of DME;Decreased safety awareness;Decreased mobility  PT Treatment Interventions DME instruction;Gait training;Stair training;Balance training;Neuromuscular re-education;Functional mobility training;Patient/family education;Therapeutic activities;Therapeutic exercise   PT Goals (Current goals can be found in the Care Plan section) Acute Rehab PT Goals Patient Stated Goal: To return to [facility]  PT Goal Formulation: With patient/family Time For Goal Achievement: 06/28/15 Potential to Achieve Goals: Good    Frequency Min 4X/week   Barriers to discharge        Co-evaluation PT/OT/SLP Co-Evaluation/Treatment: Yes Reason for Co-Treatment: Other (comment) (needing HD and scheduled for EGD this pm as well as planned stroke workup procedures planned) PT goals addressed during session: Mobility/safety with mobility;Balance;Strengthening/ROM         End of Session Equipment Utilized During Treatment: Gait belt Activity Tolerance: Patient tolerated treatment well Patient left: in chair;with call bell/phone within reach;with family/visitor present           Time: [id number] PT Time Calculation (min) (ACUTE ONLY): 30 min   Charges:   PT Evaluation $Initial PT Evaluation Tier I: 1 Procedure     PT G CodesReginia Naas July 11, 2015, 11:33 AM  Magda Kiel, Mason 07-11-2015

## 2015-06-21 NOTE — Progress Notes (Signed)
Patient was found to have stroke and his EGD was postponed. Has been evaluated by neurology and it appears that this was an old stroke that happened in the past. We are now to go ahead with EGD. Initially, the patient refused again because he is not been able to eat and was upset. Both I and his fiance talk to him and we have him scheduled for 230 this afternoon and will allow him to have clear liquids up until 10 o'clock. He agreed to that and stated that he will go ahead with the procedure.

## 2015-06-21 NOTE — Evaluation (Signed)
Occupational Therapy Evaluation Patient Details Name: George [name] MRN: [id number] DOB: 21-Mar-1971 Today's Date: 06/21/2015    History of Present Illness George [name] is a 44 y.o. male with a history of ESRD 2/2 HTN, DM, HTN current smoker who presented with SOB and CP and found to be very uremic. He also endorses that he had sudden onset dizziness, and increased confusion in the middle of last week. MRI shows acute infarct involving the right frontal operculum, insular cortex, right lentiform nucleus, and superior temporal gyrus. Remote lacunar infarcts in the cerebellum noted bilaterally.    Clinical Impression   This 44 yo male admitted with above presents to acute OT with decreased balance, increased weakness of left side, decreased mobility all affecting his PLOF of being independent with basic and IADLs. He will benefit from acute OT with follow up OPOT.    Follow Up Recommendations  Outpatient OT;Supervision/Assistance - 24 hour    Equipment Recommendations  None recommended by OT       Precautions / Restrictions Precautions Precautions: Fall Precaution Comments: L UE>LE weakness Restrictions Weight Bearing Restrictions: No      Mobility Bed Mobility Overal bed mobility: Needs Assistance Bed Mobility: Supine to Sit     Supine to sit: Supervision     General bed mobility comments: assist for IV and line management  Transfers Overall transfer level: Needs assistance   Transfers: Sit to/from Stand;Stand Pivot Transfers Sit to Stand: Min assist Stand pivot transfers: Min assist;+2 safety/equipment       General transfer comment: Demonstrates strength to rise from bed and take pivotal steps to chair with assist for steadying and managing lines; limited assessment with stroke MD in the room and pt wanting to have liquid tray prior to beginning restrictions for afternoon procedure    Balance Overall balance assessment: Needs assistance Sitting-balance support: No  upper extremity supported Sitting balance-Leahy Scale: Good       Standing balance-Leahy Scale: Fair Standing balance comment: limited assesment due to pt being needed for multiple assessments and procedures today---mild imbalance in LLE needs Min A for standing                            ADL Overall ADL's : Needs assistance/impaired Eating/Feeding: Set up;Sitting   Grooming: Minimal assistance;Sitting   Upper Body Bathing: Set up;Supervision/ safety;Sitting   Lower Body Bathing: Minimal assistance;Sit to/from stand   Upper Body Dressing : Minimal assistance;Sitting   Lower Body Dressing: Minimal assistance;Sit to/from stand   Toilet Transfer: Minimal assistance;Stand-pivot (bed>recliner)   Toileting- Clothing Manipulation and Hygiene: Minimal assistance;Sit to/from stand               Vision Vision Assessment?: Yes Eye Alignment: Within Functional Limits Ocular Range of Motion: Within Functional Limits;Restricted looking down Tracking/Visual Pursuits: Able to track stimulus in all quads without difficulty Convergence: Within functional limits Visual Fields: No apparent deficits          Pertinent Vitals/Pain Pain Assessment: No/denies pain     Hand Dominance Right   Extremity/Trunk Assessment Upper Extremity Assessment Upper Extremity Assessment: LUE deficits/detail LUE Deficits / Details: generalized weakness with attempts to use it functionally but with decreased strength has trouble. Attempted to hold toothpaste in LUE and open it with right hand, however he could not at which point he automatically transferred toothpaste tube into left hand and opened it with RUE. LUE Coordination: decreased fine motor;decreased gross motor   Lower Extremity Assessment  Lower Extremity Assessment: RLE deficits/detail;LLE deficits/detail RLE Deficits / Details: WFL with mobility bed to chair LLE Deficits / Details: weakness as compared to R, but functionally  supports weight for stand step to chair       Communication Communication Communication: Expressive difficulties (low phonation)   Cognition Arousal/Alertness: Awake/alert Behavior During Therapy: WFL for tasks assessed/performed Overall Cognitive Status: Within Functional Limits for tasks assessed                                Home Living Family/patient expects to be discharged to:: Private residence Living Arrangements: Spouse/significant other;Other relatives Available Help at Discharge: Family;Available 24 hours/day Type of Home: House Home Access: Stairs to enter [employer] of Steps: 4 Entrance Stairs-Rails: Right;Left;Can reach both Home Layout: One level     Bathroom Shower/Tub: Tub/shower unit         Home Equipment: None      Lives With: Family;Significant other    Prior Functioning/Environment Level of Independence: Independent        Comments: Lives usually in [place] with fiance', but has been in [place] since their temporary separation and staying with sister; fiance' reports plans to return to [place] when stable    OT Diagnosis: Generalized weakness;Hemiplegia non-dominant side   OT Problem List: Decreased strength;Impaired UE functional use;Impaired balance (sitting and/or standing);Decreased coordination   OT Treatment/Interventions: Self-care/ADL training;Patient/family education;Therapeutic activities;Therapeutic exercise;DME and/or AE instruction    OT Goals(Current goals can be found in the care plan section) Acute Rehab OT Goals Patient Stated Goal: To return to [place] with George [name] OT Goal Formulation: With patient/family Time For Goal Achievement: 06/28/15 Potential to Achieve Goals: Good  OT Frequency: Min 3X/week           Co-evaluation   Reason for Co-Treatment:  (needing HD and other multiple procedures for stroke work up today) PT goals addressed during session: Mobility/safety with  mobility;Balance;Strengthening/ROM        End of Session Equipment Utilized During Treatment: Gait belt  Activity Tolerance: Patient tolerated treatment well Patient left: in chair;with call bell/phone within reach;with family/visitor present (SLP with him)   Time: [id number] OT Time Calculation (min): 30 min Charges:  OT General Charges $OT Visit: 1 Procedure OT Evaluation $Initial OT Evaluation Tier I: 1 Procedure  Almon Register [id number] 06/21/2015, 1:10 PM

## 2015-06-21 NOTE — H&P (View-Only) (Signed)
Patient was found to have stroke and his EGD was postponed. Has been evaluated by neurology and it appears that this was an old stroke that happened in the past. We are now to go ahead with EGD. Initially, the patient refused again because he is not been able to eat and was upset. Both I and his fiance talk to him and we have him scheduled for 230 this afternoon and will allow him to have clear liquids up until 10 o'clock. He agreed to that and stated that he will go ahead with the procedure.

## 2015-06-21 NOTE — Progress Notes (Signed)
[facility] PROGRESS NOTE  George [name] [id number] DOB: Jul 18, 1970 DOA: 06/15/2015 PCP: No primary care provider on file.   brief interval history George [name] is a 44 y.o. male with a history of ESRD on Peritoneal dialysis, DM2, and HTN who presents to the ED with complaints of right sided chest pain and SOB. He was found to have hypoxemia in the ED to 85%. He was placed on NCO2 at 2 liters. He was referred for further workup.   Patient was admitted treated for uremia secondary to under dialysis with CCPD  Per renal. Patient also noted during the hospitalization to have coffee-ground emesis was placed on a Protonix drip and transfused 2 units packed red blood cells. GI was consulted who followed the patient however patient initially refused upper endoscopy. But he underwent it on 12/22 and was found to have esophagitis. He was started on PPI.    Assessment/Plan: #1 uremia/under dialysis/end-stage renal disease on peritoneal dialysis Patient presented with uremia, probably from missing peritoneal dialysis. Patient denies any chest pain. Patient denies any shortness of breath.Patient on peritoneal dialysis. Patient had vein mapping and and plan for access placement and tunneled dialysis catheter placement on 12/20 per vascular surgery for transition to HD. Per nephrology.  #2 hematemesis/coffee-ground emesis Patient with coffee-ground emesis on 06/17/2015. Patient endorsed recent use of ibuprofen 800 mg 4 times daily for right leg pain. Patient denies daily chronic alcohol use. Per wife patient with prior history of duodenal ulcer. Continue Protonix drip. Patient status post 2 units packed red blood cells . Patient was seen in consultation by gastroenterology, Dr. Penelope Coop who had recommended an upper endoscopy however patient refused initially but agreed to it. Transfusion threshold hemoglobin less than 7. GI following and appreciate input and recommendations. He was scheduled for EGD on  12/21, but his CT head revealed to have a subacute CVA. His EGD was postponed to 12/22. He underwent EGD today, showed mild erosive esophagitis.   #3 hypertension Continue Norvasc, digoxin, BiDil, Cozaar.Change IV Lopressor to home dose beta blocker.  #4 well-controlled diabetes mellitus Patient on oral diabetic medications at home. Hemoglobin A1c = 5.4. Continue sliding scale insulin. Hold oral hypoglycemic agents. Follow.  #5 anemia/acute blood loss anemia Likely multifactorial secondary to acute blood loss anemia and anemia of chronic disease. Anemia panel with iron of 145 TIBC of 200 ferritin of 846 consistent with anemia of chronic disease likely secondary to end-stage renal disease on hemodialysis. Patient with coffee-ground emesis on 06/17/2015. He was also found to have melanotic stools on 12/20 .  Patient status post 2 units packed red blood cells.  Continue Protonix drip. Transfusion threshold hemoglobin less than 7. Underwent EGD showed mild erosive esophagitis. Recommend to continue the PPI.  #6 elevated troponins Patient denies any acute chest pain. Troponins trending down..If acute coronary syndrome may be secondary to problem #1. 2-D echo with EF of 60-65% with severe LVH, grade 1 diastolic dysfunction. Severe nodular posterior annular calcification on the mitral valve. Continue beta blocker, ARB, BiDil, digoxin. Follow.  #7 shortness of breath Likely secondary to hypervolemia and under hemodialysis. VQ scan negative for PE. Lower extremity Dopplers negative for DVT or SVT. Patient on CPPD per renal to be converted to HD.  #8 prophylaxis PPI for GI prophylaxis. SCDs for DVT prophylaxis.   #9 acute on chronic encephalopathy: Dr Deterding recommended CT head, which revealed a sub acute right MCA stroke. Stroke work up ordered with an MRI of the brain and MRA head and neck.  MRi brain showed acute non hemorrhagic infarct involving the right frontal operculum , insular cortex, rigth  lentiform nucleus and superior temporal gyrus, marked attenuation multiple M3 branch vessels. Focal mass effect with 1 to2 mm midline shift and partial effacement of the right lateral ventricle. Mass effect is atypical. Recommend follow up MRI in 1 to 2 months, to assure no underlying mass lesion and expected evolution of the infarct. Also found remote lacunar infarcts int he cerebellum bilaterally.  Neurology consulted and recommendations given. There was a question of atrial fibrillation in the past. Discussed with the patient and RN, plan to obtain records from [place] to start eliquis as soon as possible.    Code Status: Full Family Communication: none at bedside. Disposition Plan: pending PT eval.    Consultants:  Nephrology: Dr. Jonnie Finner 06/16/2015  Gastroenterology: Dr. Penelope Coop 06/17/2015  Neurology   Procedures:  V/Q scan 06/16/2015  CXR 06/16/2015  2-D echo 06/18/2015  2 units packed red blood cells 06/17/2015  MRI brain  MRA head and neck.   Antibiotics:  None  HPI/Subjective: No chest pain, sob. No melanotic stools today. Hemoglobin is  Stable.   Objective: Filed Vitals:   06/21/15 1700 06/21/15 1730  BP: 133/77 142/82  Pulse: 97 98  Temp:    Resp:      Intake/Output Summary (Last 24 hours) at 06/21/15 1812 Last data filed at 06/21/15 0600  Gross per 24 hour  Intake    323 ml  Output      0 ml  Net    323 ml   Filed Weights   06/20/15 0500 06/21/15 0420 06/21/15 1546  Weight: 87.9 kg (193 lb 12.6 oz) 89.8 kg (197 lb 15.6 oz) 90.5 kg (199 lb 8.3 oz)    Exam:   General:  Comfortable.   Cardiovascular: RRR  Respiratory: CTAB  Abdomen: Soft, nontender, nondistended, positive bowel sounds.  Musculoskeletal: No clubbing cyanosis or edema.  Data Reviewed: Basic Metabolic Panel:  Recent Labs Lab 06/18/15 0810 06/19/15 0310 06/19/15 1600 06/20/15 0500 06/21/15 0241  NA 142 142 144 140 138  K 4.3 3.9 4.5 4.5 4.9  CL 104 104 105 105  101  CO2 21* 22 24 23 22   GLUCOSE 147* 223* 93 98 99  BUN 143* 134* 113* 92* 107*  CREATININE 22.64* 22.20* 20.64* 17.38* 20.61*  CALCIUM 8.4* 8.5* 8.2*  7.7* 8.4* 8.3*  PHOS 8.0* 7.6* 8.7* 7.3* 9.6*   Liver Function Tests:  Recent Labs Lab 06/19/15 0310 06/19/15 1600 06/20/15 0307 06/20/15 0500 06/21/15 0241  AST  --   --  34  --   --   ALT  --   --  31  --   --   ALKPHOS  --   --  57  --   --   BILITOT  --   --  0.4  --   --   PROT  --   --  6.5  --   --   ALBUMIN 2.7* 2.7* 2.7* 2.7* 2.4*   No results for input(s): LIPASE, AMYLASE in the last 168 hours. No results for input(s): AMMONIA in the last 168 hours. CBC:  Recent Labs Lab 06/19/15 0310 06/19/15 1600 06/19/15 1807 06/20/15 0307 06/20/15 0857 06/20/15 1447 06/20/15 2028 06/21/15 0241  WBC 10.2 11.2* 13.0* 15.7*  --   --   --  13.0*  HGB 7.5* 7.3* 8.6* 9.6* 8.8* 8.8* 8.7* 8.5*  HCT 22.4* 21.2* 24.8* 29.0* 26.0* 26.9* 26.2* 25.6*  MCV  86.5 88.3 88.3 89.5  --   --   --  90.5  PLT 171 169 191 144*  --   --   --  163   Cardiac Enzymes:  Recent Labs Lab 06/16/15 1352 06/16/15 1910 06/17/15 0543 06/17/15 1211 06/17/15 2343  TROPONINI 0.10* 0.07* 0.08* 0.08* 0.10*   BNP (last 3 results) No results for input(s): BNP in the last 8760 hours.  ProBNP (last 3 results) No results for input(s): PROBNP in the last 8760 hours.  CBG:  Recent Labs Lab 06/20/15 1213 06/20/15 2106 06/21/15 0041 06/21/15 0427 06/21/15 1001  GLUCAP 103* 107* 98 91 122*    Recent Results (from the past 240 hour(s))  MRSA PCR Screening     Status: None   Collection Time: 06/16/15  3:47 PM  Result Value Ref Range Status   MRSA by PCR NEGATIVE NEGATIVE Final    Comment:        The GeneXpert MRSA Assay (FDA approved for NASAL specimens only), is one component of a comprehensive MRSA colonization surveillance program. It is not intended to diagnose MRSA infection nor to guide or monitor treatment for MRSA  infections.      Studies: Ct Head Wo Contrast  06/20/2015  CLINICAL DATA:  Confusion. EXAM: CT HEAD WITHOUT CONTRAST TECHNIQUE: Contiguous axial images were obtained from the base of the skull through the vertex without contrast. COMPARISON:  None FINDINGS: Mildly ill-defined low density involving the right frontal-temporal lobe. This area measures up 3.7 cm in the AP dimension. There is a small amount of sulcal effacement surrounding this hypodensity. There is no evidence for midline shift or hydronephrosis. No evidence for acute hemorrhage. There is a hyperdense MCA sign in the right sylvian fissure. Visualized mastoid air cells are clear. Minimal mucosal disease in the left maxillary sinus. Otherwise, the visualized paranasal sinuses are clear. No calvarial fracture. Focal low-density structure in the left parietal-occipital region could represent an old insult or infarct. IMPRESSION: There is a large low-density area occupying the right frontal and temporal lobes suggestive for an infarct. There is thrombus in the right MCA at the right sylvian fissure and a small amount of sulci effacement around the infarct. Findings are suggestive for early subacute infarct in the right MCA territory. No evidence for acute hemorrhage or hemorrhagic transformation. These results were called by telephone at the time of interpretation on 06/20/2015 at 12:25 pm to the patient's nurse, Vicente Males, who verbally acknowledged these results. Electronically Signed   By: Markus Daft M.D.   On: 06/20/2015 12:33   Mr Jodene Nam Head Wo Contrast  06/20/2015  CLINICAL DATA:  Sudden onset of dizziness and increased confusion. Left-sided facial droop. Personal history of end-stage renal disease, hypertension, diabetes. The examination had to be discontinued prior to completion due to patient refusal low tear the holes left. EXAM: MRI HEAD WITHOUT CONTRAST MRA HEAD WITHOUT CONTRAST TECHNIQUE: Multiplanar, multiecho pulse sequences of the brain  and surrounding structures were obtained without intravenous contrast. Angiographic images of the head were obtained using MRA technique without contrast. COMPARISON:  CT head without contrast from the same day. FINDINGS: MRI HEAD FINDINGS Acute nonhemorrhagic infarct is evident within the right frontal operculum, insular cortex, right lentiform nucleus, and superior temporal gyrus. There is associated T2 signal change and mass effect with partial effacement of the right lateral ventricle. Midline shift of 1-2 mm is noted. Mild periventricular white matter changes are noted bilaterally. There is focal T2 signal abnormality within the left coronal radiata.  There well-formed cystic lesion is present in the left occipital lobe. No blood products are present. The internal auditory canals are within normal limits bilaterally. The brainstem is within normal limits. Remote lacunar infarcts are present in the cerebellum bilaterally. Flow is present in the major intracranial arteries. Bilateral lens replacements are present. The paranasal sinuses are clear. The globes and orbits are otherwise intact. The skullbase is within normal limits. Midline sagittal images are within normal limits. MRA HEAD FINDINGS There is marked attenuation of several M3 branch vessels on the right associated with the infarct. No proximal stenosis or occlusion is present. The left MCA branch vessels are intact. ACA branch vessels are normal bilaterally. The right vertebral artery is the dominant vessel. The right PICA origin is visualized and normal. AICA vessels are noted bilaterally. The basilar artery is normal. Both posterior cerebral arteries originate from the basilar tip. The PCA branch vessels are intact. IMPRESSION: 1. Acute nonhemorrhagic infarct involving the right frontal operculum, insular cortex, right lentiform nucleus, and superior temporal gyrus. 2. Marked attenuation multiple M3 branch vessels associated with the infarct. 3. Focal  mass effect with 1 - 2 mm midline shift and partial effacement of the right lateral ventricle. Mass effect is somewhat atypical. No discrete mass lesion is present. Follow-up MRI in 1-2 months is recommended to assure no underlying mass lesion and expected evolution of the infarct. 4. Age advanced periventricular white matter disease reflects chronic microvascular ischemia. 5. Focal age advanced white matter changes within the left corona radiata. 6. Remote lacunar infarcts in the cerebellum bilaterally. Electronically Signed   By: San Morelle M.D.   On: 06/20/2015 17:01   Mr Brain Wo Contrast  06/20/2015  CLINICAL DATA:  Sudden onset of dizziness and increased confusion. Left-sided facial droop. Personal history of end-stage renal disease, hypertension, diabetes. The examination had to be discontinued prior to completion due to patient refusal low tear the holes left. EXAM: MRI HEAD WITHOUT CONTRAST MRA HEAD WITHOUT CONTRAST TECHNIQUE: Multiplanar, multiecho pulse sequences of the brain and surrounding structures were obtained without intravenous contrast. Angiographic images of the head were obtained using MRA technique without contrast. COMPARISON:  CT head without contrast from the same day. FINDINGS: MRI HEAD FINDINGS Acute nonhemorrhagic infarct is evident within the right frontal operculum, insular cortex, right lentiform nucleus, and superior temporal gyrus. There is associated T2 signal change and mass effect with partial effacement of the right lateral ventricle. Midline shift of 1-2 mm is noted. Mild periventricular white matter changes are noted bilaterally. There is focal T2 signal abnormality within the left coronal radiata. There well-formed cystic lesion is present in the left occipital lobe. No blood products are present. The internal auditory canals are within normal limits bilaterally. The brainstem is within normal limits. Remote lacunar infarcts are present in the cerebellum  bilaterally. Flow is present in the major intracranial arteries. Bilateral lens replacements are present. The paranasal sinuses are clear. The globes and orbits are otherwise intact. The skullbase is within normal limits. Midline sagittal images are within normal limits. MRA HEAD FINDINGS There is marked attenuation of several M3 branch vessels on the right associated with the infarct. No proximal stenosis or occlusion is present. The left MCA branch vessels are intact. ACA branch vessels are normal bilaterally. The right vertebral artery is the dominant vessel. The right PICA origin is visualized and normal. AICA vessels are noted bilaterally. The basilar artery is normal. Both posterior cerebral arteries originate from the basilar tip. The PCA branch  vessels are intact. IMPRESSION: 1. Acute nonhemorrhagic infarct involving the right frontal operculum, insular cortex, right lentiform nucleus, and superior temporal gyrus. 2. Marked attenuation multiple M3 branch vessels associated with the infarct. 3. Focal mass effect with 1 - 2 mm midline shift and partial effacement of the right lateral ventricle. Mass effect is somewhat atypical. No discrete mass lesion is present. Follow-up MRI in 1-2 months is recommended to assure no underlying mass lesion and expected evolution of the infarct. 4. Age advanced periventricular white matter disease reflects chronic microvascular ischemia. 5. Focal age advanced white matter changes within the left corona radiata. 6. Remote lacunar infarcts in the cerebellum bilaterally. Electronically Signed   By: San Morelle M.D.   On: 06/20/2015 17:01    Scheduled Meds: . sodium chloride   Intravenous Once  . sodium chloride   Intravenous Once  . acetaminophen  650 mg Oral Once  . acetaminophen  650 mg Oral Once  . calcium acetate  667 mg Oral TID WC  . carvedilol  12.5 mg Oral BID WC  . [START ON 06/25/2015] darbepoetin (ARANESP) injection - DIALYSIS  200 mcg Intravenous Q  Mon-HD  . digoxin  0.125 mg Oral QODAY  . diphenhydrAMINE  25 mg Oral Once  . diphenhydrAMINE  25 mg Oral Once  . gentamicin cream  1 application Topical Daily  . heparin  40 Units/kg Dialysis Once in dialysis  . insulin aspart  0-9 Units Subcutaneous 6 times per day  . isosorbide-hydrALAZINE  1 tablet Oral TID  . lanthanum  1,000 mg Oral TID WC  . multivitamin  1 tablet Oral Daily  . pantoprazole (PROTONIX) IV  40 mg Intravenous Q12H  . pravastatin  20 mg Oral QPM  . sodium chloride  3 mL Intravenous Q12H   Continuous Infusions: . sodium chloride 40 mL/hr (06/20/15 1922)    Principal Problem:   Uremia Active Problems:   Hypoxia   Acute respiratory failure (HCC)   ESRD on peritoneal dialysis ([place])   Diabetes mellitus without complication ([place])   Essential hypertension   Pleuritic chest pain   SOB (shortness of breath)   Hematemesis   Hematemesis with nausea   ESRD (end stage renal disease) on dialysis (HCC)   Right leg pain   Gastrointestinal hemorrhage with melena    Time spent: [place] MD [facility] Pager [phone]. If 7PM-7AM, please contact night-coverage at [email or web address], password [facility] 06/21/2015, 6:12 PM  LOS: 5 days

## 2015-06-21 NOTE — Progress Notes (Signed)
2 Days Post-Op  Subjective: He is sitting up talking with his girlfriend.  He is a bit overwhelmed with all that has occurred.  i explained plans are to get PD cath out tomorrow around 1:30 PM, if something changes he is already set up to be seen in our office by Dr. Excell Seltzer early in January to set him up for removal of PD cath.  Objective: Vital signs in last 24 hours: Temp:  [98.2 F (36.8 C)-99.2 F (37.3 C)] 98.3 F (36.8 C) (12/22 0400) Pulse Rate:  [81-105] 105 (12/22 0600) Resp:  [14-16] 15 (12/22 0025) BP: (105-151)/(59-89) 151/89 mmHg (12/22 0600) SpO2:  [93 %-100 %] 97 % (12/22 0600) Weight:  [89.8 kg (197 lb 15.6 oz)] 89.8 kg (197 lb 15.6 oz) (12/22 0420) Last BM Date: 06/19/15 323 PO recorded TM 99.3, VSS Labs show H/H is stable WBC 13.0 MRI brain yesterday:  Acute nonhemorrhagic infarct involving the right frontal operculum, insular cortex, right lentiform nucleus, and superior temporal gyrus.  Intake/Output from previous day: 12/21 0701 - 12/22 0700 In: 323 [I.V.:323] Out: -  Intake/Output this shift:    General appearance: cooperative and a little teary this AM GI: soft, non-tender; bowel sounds normal; no masses,  no organomegaly and PD cath OK  Lab Results:   Recent Labs  06/20/15 0307  06/20/15 2028 06/21/15 0241  WBC 15.7*  --   --  13.0*  HGB 9.6*  < > 8.7* 8.5*  HCT 29.0*  < > 26.2* 25.6*  PLT 144*  --   --  163  < > = values in this interval not displayed.  BMET  Recent Labs  06/20/15 0500 06/21/15 0241  NA 140 138  K 4.5 4.9  CL 105 101  CO2 23 22  GLUCOSE 98 99  BUN 92* 107*  CREATININE 17.38* 20.61*  CALCIUM 8.4* 8.3*   PT/INR No results for input(s): LABPROT, INR in the last 72 hours.   Recent Labs Lab 06/19/15 0310 06/19/15 1600 06/20/15 0307 06/20/15 0500 06/21/15 0241  AST  --   --  34  --   --   ALT  --   --  31  --   --   ALKPHOS  --   --  57  --   --   BILITOT  --   --  0.4  --   --   PROT  --   --  6.5  --    --   ALBUMIN 2.7* 2.7* 2.7* 2.7* 2.4*     Lipase  No results found for: LIPASE   Studies/Results: Ct Head Wo Contrast  06/20/2015  CLINICAL DATA:  Confusion. EXAM: CT HEAD WITHOUT CONTRAST TECHNIQUE: Contiguous axial images were obtained from the base of the skull through the vertex without contrast. COMPARISON:  None FINDINGS: Mildly ill-defined low density involving the right frontal-temporal lobe. This area measures up 3.7 cm in the AP dimension. There is a small amount of sulcal effacement surrounding this hypodensity. There is no evidence for midline shift or hydronephrosis. No evidence for acute hemorrhage. There is a hyperdense MCA sign in the right sylvian fissure. Visualized mastoid air cells are clear. Minimal mucosal disease in the left maxillary sinus. Otherwise, the visualized paranasal sinuses are clear. No calvarial fracture. Focal low-density structure in the left parietal-occipital region could represent an old insult or infarct. IMPRESSION: There is a large low-density area occupying the right frontal and temporal lobes suggestive for an infarct. There is thrombus in  the right MCA at the right sylvian fissure and a small amount of sulci effacement around the infarct. Findings are suggestive for early subacute infarct in the right MCA territory. No evidence for acute hemorrhage or hemorrhagic transformation. These results were called by telephone at the time of interpretation on 06/20/2015 at 12:25 pm to the patient's nurse, Vicente Males, who verbally acknowledged these results. Electronically Signed   By: Markus Daft M.D.   On: 06/20/2015 12:33   Mr Jodene Nam Head Wo Contrast  06/20/2015  CLINICAL DATA:  Sudden onset of dizziness and increased confusion. Left-sided facial droop. Personal history of end-stage renal disease, hypertension, diabetes. The examination had to be discontinued prior to completion due to patient refusal low tear the holes left. EXAM: MRI HEAD WITHOUT CONTRAST MRA HEAD WITHOUT  CONTRAST TECHNIQUE: Multiplanar, multiecho pulse sequences of the brain and surrounding structures were obtained without intravenous contrast. Angiographic images of the head were obtained using MRA technique without contrast. COMPARISON:  CT head without contrast from the same day. FINDINGS: MRI HEAD FINDINGS Acute nonhemorrhagic infarct is evident within the right frontal operculum, insular cortex, right lentiform nucleus, and superior temporal gyrus. There is associated T2 signal change and mass effect with partial effacement of the right lateral ventricle. Midline shift of 1-2 mm is noted. Mild periventricular white matter changes are noted bilaterally. There is focal T2 signal abnormality within the left coronal radiata. There well-formed cystic lesion is present in the left occipital lobe. No blood products are present. The internal auditory canals are within normal limits bilaterally. The brainstem is within normal limits. Remote lacunar infarcts are present in the cerebellum bilaterally. Flow is present in the major intracranial arteries. Bilateral lens replacements are present. The paranasal sinuses are clear. The globes and orbits are otherwise intact. The skullbase is within normal limits. Midline sagittal images are within normal limits. MRA HEAD FINDINGS There is marked attenuation of several M3 branch vessels on the right associated with the infarct. No proximal stenosis or occlusion is present. The left MCA branch vessels are intact. ACA branch vessels are normal bilaterally. The right vertebral artery is the dominant vessel. The right PICA origin is visualized and normal. AICA vessels are noted bilaterally. The basilar artery is normal. Both posterior cerebral arteries originate from the basilar tip. The PCA branch vessels are intact. IMPRESSION: 1. Acute nonhemorrhagic infarct involving the right frontal operculum, insular cortex, right lentiform nucleus, and superior temporal gyrus. 2. Marked  attenuation multiple M3 branch vessels associated with the infarct. 3. Focal mass effect with 1 - 2 mm midline shift and partial effacement of the right lateral ventricle. Mass effect is somewhat atypical. No discrete mass lesion is present. Follow-up MRI in 1-2 months is recommended to assure no underlying mass lesion and expected evolution of the infarct. 4. Age advanced periventricular white matter disease reflects chronic microvascular ischemia. 5. Focal age advanced white matter changes within the left corona radiata. 6. Remote lacunar infarcts in the cerebellum bilaterally. Electronically Signed   By: San Morelle M.D.   On: 06/20/2015 17:01   Mr Brain Wo Contrast  06/20/2015  CLINICAL DATA:  Sudden onset of dizziness and increased confusion. Left-sided facial droop. Personal history of end-stage renal disease, hypertension, diabetes. The examination had to be discontinued prior to completion due to patient refusal low tear the holes left. EXAM: MRI HEAD WITHOUT CONTRAST MRA HEAD WITHOUT CONTRAST TECHNIQUE: Multiplanar, multiecho pulse sequences of the brain and surrounding structures were obtained without intravenous contrast. Angiographic images of  the head were obtained using MRA technique without contrast. COMPARISON:  CT head without contrast from the same day. FINDINGS: MRI HEAD FINDINGS Acute nonhemorrhagic infarct is evident within the right frontal operculum, insular cortex, right lentiform nucleus, and superior temporal gyrus. There is associated T2 signal change and mass effect with partial effacement of the right lateral ventricle. Midline shift of 1-2 mm is noted. Mild periventricular white matter changes are noted bilaterally. There is focal T2 signal abnormality within the left coronal radiata. There well-formed cystic lesion is present in the left occipital lobe. No blood products are present. The internal auditory canals are within normal limits bilaterally. The brainstem is within  normal limits. Remote lacunar infarcts are present in the cerebellum bilaterally. Flow is present in the major intracranial arteries. Bilateral lens replacements are present. The paranasal sinuses are clear. The globes and orbits are otherwise intact. The skullbase is within normal limits. Midline sagittal images are within normal limits. MRA HEAD FINDINGS There is marked attenuation of several M3 branch vessels on the right associated with the infarct. No proximal stenosis or occlusion is present. The left MCA branch vessels are intact. ACA branch vessels are normal bilaterally. The right vertebral artery is the dominant vessel. The right PICA origin is visualized and normal. AICA vessels are noted bilaterally. The basilar artery is normal. Both posterior cerebral arteries originate from the basilar tip. The PCA branch vessels are intact. IMPRESSION: 1. Acute nonhemorrhagic infarct involving the right frontal operculum, insular cortex, right lentiform nucleus, and superior temporal gyrus. 2. Marked attenuation multiple M3 branch vessels associated with the infarct. 3. Focal mass effect with 1 - 2 mm midline shift and partial effacement of the right lateral ventricle. Mass effect is somewhat atypical. No discrete mass lesion is present. Follow-up MRI in 1-2 months is recommended to assure no underlying mass lesion and expected evolution of the infarct. 4. Age advanced periventricular white matter disease reflects chronic microvascular ischemia. 5. Focal age advanced white matter changes within the left corona radiata. 6. Remote lacunar infarcts in the cerebellum bilaterally. Electronically Signed   By: San Morelle M.D.   On: 06/20/2015 17:01   Dg Chest Port 1 View  06/19/2015  CLINICAL DATA:  Status post dialysis catheter placement today. Initial encounter. EXAM: PORTABLE CHEST 1 VIEW COMPARISON:  PA and lateral chest 06/15/2015. FINDINGS: Right IJ approach dialysis catheter is in place with the tip  projecting within the right atrium. No pneumothorax. Right effusion basilar airspace disease have markedly improved. The left lung is clear. There is cardiomegaly. IMPRESSION: Tip of dialysis catheter projects in the right atrium. Negative for pneumothorax. Improved right pleural effusion and basilar airspace disease. Electronically Signed   By: Inge Rise M.D.   On: 06/19/2015 15:02   Dg Fluoro Guide Cv Line-no Report  06/19/2015  CLINICAL DATA:  FLOURO GUIDE CV LINE Fluoroscopy was utilized by the requesting physician.  No radiographic interpretation.    Medications: . sodium chloride   Intravenous Once  . sodium chloride   Intravenous Once  . acetaminophen  650 mg Oral Once  . acetaminophen  650 mg Oral Once  . calcium acetate  667 mg Oral TID WC  . carvedilol  12.5 mg Oral BID WC  . [START ON 06/25/2015] darbepoetin (ARANESP) injection - DIALYSIS  200 mcg Intravenous Q Mon-HD  . digoxin  0.125 mg Oral QODAY  . diphenhydrAMINE  25 mg Oral Once  . diphenhydrAMINE  25 mg Oral Once  . gentamicin cream  1 application Topical Daily  . heparin  40 Units/kg Dialysis Once in dialysis  . insulin aspart  0-9 Units Subcutaneous 6 times per day  . isosorbide-hydrALAZINE  1 tablet Oral TID  . lanthanum  1,000 mg Oral TID WC  . multivitamin  1 tablet Oral Daily  . pantoprazole (PROTONIX) IV  40 mg Intravenous Q12H  . pravastatin  20 mg Oral QPM  . sodium chloride  3 mL Intravenous Q12H    Assessment/Plan ESRD on PD with inadequate dialysis PD cath removal is scheduled for tomorrow 06/22/15 at 1:30 PM New right MCA CVA Anemia, possible GI bleed, refused EGD Starting HD AODM Hypertension Dyslipidemia   Plan:  He is scheduled for PD cath tomorrow.  If for some reason he cannot have it please give me a call today.  He is also set up for OP evaluation if he cannot have it done tomorrow.   [facility] [id number]  06/21/2015 7:52 AM      LOS: 5 days     Caden Fatica 06/21/2015

## 2015-06-22 ENCOUNTER — Encounter (HOSPITAL_COMMUNITY): Payer: Self-pay | Admitting: Critical Care Medicine

## 2015-06-22 ENCOUNTER — Inpatient Hospital Stay (HOSPITAL_COMMUNITY): Payer: Medicare Other | Admitting: Critical Care Medicine

## 2015-06-22 ENCOUNTER — Ambulatory Visit (HOSPITAL_COMMUNITY): Payer: Medicare Other

## 2015-06-22 ENCOUNTER — Encounter (HOSPITAL_COMMUNITY)
Admission: EM | Disposition: A | Discharge status: Home / Self Care | Payer: Self-pay | Source: Home / Self Care | Attending: Internal Medicine

## 2015-06-22 DIAGNOSIS — I639 Cerebral infarction, unspecified: Secondary | ICD-10-CM

## 2015-06-22 DIAGNOSIS — I6340 Cerebral infarction due to embolism of unspecified cerebral artery: Secondary | ICD-10-CM | POA: Insufficient documentation

## 2015-06-22 HISTORY — PX: CAPD REMOVAL: SHX5234

## 2015-06-22 LAB — GLUCOSE, CAPILLARY
GLUCOSE-CAPILLARY: 115 mg/dL — AB (ref 65–99)
GLUCOSE-CAPILLARY: 119 mg/dL — AB (ref 65–99)
GLUCOSE-CAPILLARY: 121 mg/dL — AB (ref 65–99)
GLUCOSE-CAPILLARY: 85 mg/dL (ref 65–99)
GLUCOSE-CAPILLARY: 90 mg/dL (ref 65–99)
Glucose-Capillary: 100 mg/dL — ABNORMAL HIGH (ref 65–99)
Glucose-Capillary: 97 mg/dL (ref 65–99)

## 2015-06-22 LAB — RENAL FUNCTION PANEL
ALBUMIN: 2.4 g/dL — AB (ref 3.5–5.0)
ANION GAP: 12 (ref 5–15)
BUN: 71 mg/dL — AB (ref 6–20)
CALCIUM: 8.1 mg/dL — AB (ref 8.9–10.3)
CO2: 26 mmol/L (ref 22–32)
Chloride: 99 mmol/L — ABNORMAL LOW (ref 101–111)
Creatinine, Ser: 15.82 mg/dL — ABNORMAL HIGH (ref 0.61–1.24)
GFR calc Af Amer: 4 mL/min — ABNORMAL LOW (ref >60)
GFR, EST NON AFRICAN AMERICAN: 3 mL/min — AB (ref >60)
GLUCOSE: 92 mg/dL (ref 65–99)
PHOSPHORUS: 7 mg/dL — AB (ref 2.5–4.6)
Potassium: 4.6 mmol/L (ref 3.5–5.1)
SODIUM: 137 mmol/L (ref 135–145)

## 2015-06-22 LAB — CBC
HCT: 25.1 % — ABNORMAL LOW (ref 39.0–52.0)
Hemoglobin: 8.4 g/dL — ABNORMAL LOW (ref 13.0–17.0)
MCH: 30.2 pg (ref 26.0–34.0)
MCHC: 33.5 g/dL (ref 30.0–36.0)
MCV: 90.3 fL (ref 78.0–100.0)
Platelets: 175 10*3/uL (ref 150–400)
RBC: 2.78 MIL/uL — ABNORMAL LOW (ref 4.22–5.81)
RDW: 15.5 % (ref 11.5–15.5)
WBC: 12.5 10*3/uL — ABNORMAL HIGH (ref 4.0–10.5)

## 2015-06-22 LAB — HEMOGLOBIN A1C
Hgb A1c MFr Bld: 5.6 % (ref 4.8–5.6)
Mean Plasma Glucose: 114 mg/dL

## 2015-06-22 SURGERY — CONTINUOUS AMBULATORY PERITONEAL DIALYSIS  (CAPD) CATHETER REMOVAL
Anesthesia: General | Site: Abdomen

## 2015-06-22 MED ORDER — SUCCINYLCHOLINE CHLORIDE 20 MG/ML IJ SOLN
INTRAMUSCULAR | Status: AC
  Filled 2015-06-22: qty 1

## 2015-06-22 MED ORDER — PHENYLEPHRINE HCL 10 MG/ML IJ SOLN
INTRAMUSCULAR | Status: DC | PRN
  Administered 2015-06-22 (×2): 80 ug via INTRAVENOUS

## 2015-06-22 MED ORDER — BUPIVACAINE-EPINEPHRINE (PF) 0.25% -1:200000 IJ SOLN
INTRAMUSCULAR | Status: AC
  Filled 2015-06-22: qty 30

## 2015-06-22 MED ORDER — EPHEDRINE SULFATE 50 MG/ML IJ SOLN
INTRAMUSCULAR | Status: AC
  Filled 2015-06-22: qty 1

## 2015-06-22 MED ORDER — FENTANYL CITRATE (PF) 250 MCG/5ML IJ SOLN
INTRAMUSCULAR | Status: AC
  Filled 2015-06-22: qty 5

## 2015-06-22 MED ORDER — SUCCINYLCHOLINE CHLORIDE 20 MG/ML IJ SOLN
INTRAMUSCULAR | Status: DC | PRN
  Administered 2015-06-22: 100 mg via INTRAVENOUS

## 2015-06-22 MED ORDER — SODIUM CHLORIDE 0.9 % IJ SOLN
INTRAMUSCULAR | Status: AC
  Filled 2015-06-22: qty 10

## 2015-06-22 MED ORDER — ROCURONIUM BROMIDE 50 MG/5ML IV SOLN
INTRAVENOUS | Status: AC
  Filled 2015-06-22: qty 1

## 2015-06-22 MED ORDER — PROMETHAZINE HCL 25 MG/ML IJ SOLN: 6.2500 mg | INTRAMUSCULAR | Status: DC | PRN

## 2015-06-22 MED ORDER — PROPOFOL 10 MG/ML IV BOLUS
INTRAVENOUS | Status: DC | PRN
  Administered 2015-06-22: 10 mg via INTRAVENOUS
  Administered 2015-06-22: 150 mg via INTRAVENOUS
  Administered 2015-06-22: 20 mg via INTRAVENOUS

## 2015-06-22 MED ORDER — MIDAZOLAM HCL 5 MG/5ML IJ SOLN
INTRAMUSCULAR | Status: DC | PRN
  Administered 2015-06-22: 1 mg via INTRAVENOUS

## 2015-06-22 MED ORDER — LIDOCAINE HCL (CARDIAC) 20 MG/ML IV SOLN
INTRAVENOUS | Status: DC | PRN
  Administered 2015-06-22: 60 mg via INTRAVENOUS

## 2015-06-22 MED ORDER — MIDAZOLAM HCL 2 MG/2ML IJ SOLN
INTRAMUSCULAR | Status: AC
  Filled 2015-06-22: qty 2

## 2015-06-22 MED ORDER — MEPERIDINE HCL 25 MG/ML IJ SOLN: 6.2500 mg | INTRAMUSCULAR | Status: DC | PRN

## 2015-06-22 MED ORDER — CHLORPROMAZINE HCL 25 MG PO TABS
12.5000 mg | ORAL_TABLET | Freq: Three times a day (TID) | ORAL | Status: DC
  Administered 2015-06-22 – 2015-06-26 (×13): 12.5 mg via ORAL
  Filled 2015-06-22 (×18): qty 1

## 2015-06-22 MED ORDER — FENTANYL CITRATE (PF) 100 MCG/2ML IJ SOLN: 25.0000 ug | INTRAMUSCULAR | Status: DC | PRN

## 2015-06-22 MED ORDER — BUPIVACAINE-EPINEPHRINE 0.25% -1:200000 IJ SOLN
INTRAMUSCULAR | Status: DC | PRN
  Administered 2015-06-22: 30 mL

## 2015-06-22 MED ORDER — MIDAZOLAM HCL 2 MG/2ML IJ SOLN: 0.5000 mg | Freq: Once | INTRAMUSCULAR | Status: DC | PRN

## 2015-06-22 MED ORDER — LIDOCAINE HCL (CARDIAC) 20 MG/ML IV SOLN
INTRAVENOUS | Status: AC
  Filled 2015-06-22: qty 5

## 2015-06-22 MED ORDER — PHENYLEPHRINE 40 MCG/ML (10ML) SYRINGE FOR IV PUSH (FOR BLOOD PRESSURE SUPPORT)
PREFILLED_SYRINGE | INTRAVENOUS | Status: AC
  Filled 2015-06-22: qty 10

## 2015-06-22 MED ORDER — 0.9 % SODIUM CHLORIDE (POUR BTL) OPTIME
TOPICAL | Status: DC | PRN
  Administered 2015-06-22: 1000 mL

## 2015-06-22 MED ORDER — CEFAZOLIN SODIUM-DEXTROSE 2-3 GM-% IV SOLR
INTRAVENOUS | Status: DC | PRN
  Administered 2015-06-22: 2 g via INTRAVENOUS

## 2015-06-22 MED ORDER — ONDANSETRON HCL 4 MG/2ML IJ SOLN
INTRAMUSCULAR | Status: DC | PRN
  Administered 2015-06-22: 4 mg via INTRAVENOUS

## 2015-06-22 MED ORDER — PROPOFOL 10 MG/ML IV BOLUS
INTRAVENOUS | Status: AC
  Filled 2015-06-22: qty 20

## 2015-06-22 MED ORDER — FENTANYL CITRATE (PF) 100 MCG/2ML IJ SOLN
INTRAMUSCULAR | Status: DC | PRN
  Administered 2015-06-22: 50 ug via INTRAVENOUS

## 2015-06-22 SURGICAL SUPPLY — 34 items
BLADE SURG 15 STRL LF DISP TIS (BLADE) ×1 IMPLANT
BLADE SURG 15 STRL SS (BLADE) ×2
BLADE SURG ROTATE 9660 (MISCELLANEOUS) ×1 IMPLANT
CANISTER SUCTION 2500CC (MISCELLANEOUS) ×1 IMPLANT
COVER SURGICAL LIGHT HANDLE (MISCELLANEOUS) ×2 IMPLANT
DRAPE LAPAROTOMY T 102X78X121 (DRAPES) ×2 IMPLANT
ELECT CAUTERY BLADE 6.4 (BLADE) ×2 IMPLANT
ELECT REM PT RETURN 9FT ADLT (ELECTROSURGICAL) ×2
ELECTRODE REM PT RTRN 9FT ADLT (ELECTROSURGICAL) ×1 IMPLANT
GAUZE SPONGE 4X4 16PLY XRAY LF (GAUZE/BANDAGES/DRESSINGS) ×2 IMPLANT
GLOVE BIO SURGEON STRL SZ7.5 (GLOVE) ×1 IMPLANT
GLOVE SURG SIGNA 7.5 PF LTX (GLOVE) ×2 IMPLANT
GOWN STRL REUS W/ TWL LRG LVL3 (GOWN DISPOSABLE) ×1 IMPLANT
GOWN STRL REUS W/ TWL XL LVL3 (GOWN DISPOSABLE) ×1 IMPLANT
GOWN STRL REUS W/TWL LRG LVL3 (GOWN DISPOSABLE) ×2
GOWN STRL REUS W/TWL XL LVL3 (GOWN DISPOSABLE) ×2
KIT BASIN OR (CUSTOM PROCEDURE TRAY) ×2 IMPLANT
KIT ROOM TURNOVER OR (KITS) ×2 IMPLANT
LIQUID BAND (GAUZE/BANDAGES/DRESSINGS) ×2 IMPLANT
NDL HYPO 25GX1X1/2 BEV (NEEDLE) ×1 IMPLANT
NEEDLE HYPO 25GX1X1/2 BEV (NEEDLE) ×2 IMPLANT
NS IRRIG 1000ML POUR BTL (IV SOLUTION) ×2 IMPLANT
PACK SURGICAL SETUP 50X90 (CUSTOM PROCEDURE TRAY) ×2 IMPLANT
PAD ARMBOARD 7.5X6 YLW CONV (MISCELLANEOUS) ×3 IMPLANT
PENCIL BUTTON HOLSTER BLD 10FT (ELECTRODE) ×2 IMPLANT
SPONGE LAP 18X18 X RAY DECT (DISPOSABLE) ×1 IMPLANT
SUT MNCRL AB 4-0 PS2 18 (SUTURE) ×2 IMPLANT
SUT VIC AB 3-0 SH 18 (SUTURE) ×1 IMPLANT
SYR BULB 3OZ (MISCELLANEOUS) ×2 IMPLANT
SYR CONTROL 10ML LL (SYRINGE) ×2 IMPLANT
TOWEL OR 17X24 6PK STRL BLUE (TOWEL DISPOSABLE) ×2 IMPLANT
TOWEL OR 17X26 10 PK STRL BLUE (TOWEL DISPOSABLE) ×2 IMPLANT
TUBE CONNECTING 12X1/4 (SUCTIONS) ×1 IMPLANT
YANKAUER SUCT BULB TIP NO VENT (SUCTIONS) IMPLANT

## 2015-06-22 NOTE — Anesthesia Preprocedure Evaluation (Addendum)
Anesthesia Evaluation  Patient identified by MRN, date of birth, ID band Patient awake    Reviewed: Allergy & Precautions, NPO status , Patient's Chart, lab work & pertinent test results, reviewed documented beta blocker date and time   History of Anesthesia Complications Negative for: history of anesthetic complications  Airway Mallampati: III  TM Distance: >3 FB Neck ROM: Full    Dental  (+) Teeth Intact, Dental Advisory Given   Pulmonary sleep apnea (had sleep study - does not wear CPAP) , Current Smoker,    breath sounds clear to auscultation       Cardiovascular hypertension, Pt. on medications and Pt. on home beta blockers  Rhythm:Regular Rate:Normal  06/18/15 ECHO: EF 60-65%   Neuro/Psych CVA (left sided weakness), Residual Symptoms    GI/Hepatic GERD (occasional - not on meds)  Medicated,(+)     substance abuse  marijuana use,   Endo/Other  diabetes (glu 90), Type 2, Oral Hypoglycemic Agents, Insulin Dependent  Renal/GU ESRF and DialysisRenal disease (Dialysis 12/22, K+ 4.6)     Musculoskeletal   Abdominal   Peds  Hematology  (+) Blood dyscrasia (H/H 8.14/25.1), anemia ,   Anesthesia Other Findings   Reproductive/Obstetrics                           Anesthesia Physical Anesthesia Plan  ASA: III  Anesthesia Plan: General   Post-op Pain Management:    Induction: Intravenous  Airway Management Planned: Oral ETT  Additional Equipment:   Intra-op Plan:   Post-operative Plan: Extubation in OR  Informed Consent: I have reviewed the patients History and Physical, chart, labs and discussed the procedure including the risks, benefits and alternatives for the proposed anesthesia with the patient or authorized representative who has indicated his/her understanding and acceptance.   Dental advisory given  Plan Discussed with: Anesthesiologist, Surgeon and CRNA  Anesthesia Plan  Comments: (Plan routine monitors, GETA)       Anesthesia Quick Evaluation

## 2015-06-22 NOTE — Transfer of Care (Signed)
Immediate Anesthesia Transfer of Care Note  Patient: George [name]  Procedure(s) Performed: Procedure(s): CONTINUOUS AMBULATORY PERITONEAL DIALYSIS  (CAPD) CATHETER REMOVAL (N/A)  Patient Location: PACU  Anesthesia Type:General  Level of Consciousness: sedated  Airway & Oxygen Therapy: Patient Spontanous Breathing and Patient connected to nasal cannula oxygen  Post-op Assessment: Report given to RN, Post -op Vital signs reviewed and stable and Patient moving all extremities X 4  Post vital signs: Reviewed and stable  Last Vitals:  Filed Vitals:   06/22/15 0016 06/22/15 0427  BP: 135/81 143/90  Pulse: 88 90  Temp: 36.6 C 36.9 C  Resp: 18 18    Complications: No apparent anesthesia complications

## 2015-06-22 NOTE — Progress Notes (Signed)
Speech Language Pathology Treatment: Dysphagia;Cognitive-Linquistic  Patient Details Name: George [name] MRN: [id number] DOB: May 01, 1971 Today's Date: 06/22/2015 Time: [id number] SLP Time Calculation (min) (ACUTE ONLY): 13 min  Assessment / Plan / Recommendation Clinical Impression  Pt's diet has been advanced to regular textures and thin liquids. Breakfast tray already finished at the time of SLP arrival, but pt denies difficulties. Pt consumed additional PO trials consisting of regular textures and thin liquids with mild left-sided buccal pocketing, for which pt needed Min-Mod cues for emergent awareness and self-monitoring. Lingual sweeps and liquid washes together work toward clearing residuals. Throughout intake, pt required Mod cues to increase vocal intensity to improve intelligibility of speech. Will continue to follow. Pt to remain on current diet.   HPI HPI: George [name] is a 44 y.o. male with a history of ESRD 2/2 HTN, DM, HTN current smoker who presented with SOB and CP and found to be very uremic. He also endorses that he had sudden onset dizziness, and increased confusion in the middle of last week. MRI shows acute infarct involving the right frontal operculum, insular cortex, right lentiform nucleus, and superior temporal gyrus. Remote lacunar infarcts in the cerebellum ntoed bilaterally.      SLP Plan  Continue with current plan of care     Recommendations  Diet recommendations: Regular;Thin liquid Liquids provided via: Cup;Straw Medication Administration: Whole meds with puree Supervision: Patient able to self feed;Intermittent supervision to cue for compensatory strategies Compensations: Slow rate;Small sips/bites;Lingual sweep for clearance of pocketing;Monitor for anterior loss Postural Changes and/or Swallow Maneuvers: Seated upright 90 degrees              Oral Care Recommendations: Oral care BID Follow up Recommendations: Outpatient SLP;24 hour  supervision/assistance Plan: Continue with current plan of care   Germain Osgood, M.A. CCC-SLP [phone]  Germain Osgood 06/22/2015, 11:30 AM

## 2015-06-22 NOTE — Progress Notes (Signed)
*  PRELIMINARY RESULTS* Vascular Ultrasound Carotid Duplex has been completed. Bilateral: No significant (1-39%) ICA stenosis. Antegrade vertebral flow.     Landry Mellow, RDMS, RVT  06/22/2015, 11:42 AM

## 2015-06-22 NOTE — Progress Notes (Signed)
Occupational Therapy Treatment Patient Details Name: George [name] MRN: [id number] DOB: [birth year] Today's Date: 06/22/2015    History of present illness George [name] is a 43 y.o. male with a history of ESRD 2/2 HTN, DM, HTN current smoker who presented with SOB and CP and found to be very uremic. He also endorses that he had sudden onset dizziness, and increased confusion in the middle of last week. MRI shows acute infarct involving the right frontal operculum, insular cortex, right lentiform nucleus, and superior temporal gyrus. Remote lacunar infarcts in the cerebellum noted bilaterally.    OT comments  Pt seen today for treatment session and not making progress since eval of yesterday- (of note pt did have procedure this AM and still may be recovering from anesthesia); however also able to do more with pt today. Noted today was his inattention to his left side and decreased following/following through with commands even after he initiated some of the FM activities I asked him to do. Also since I was able to get him up on his feet and go a longer distance with him noted safety concerns due to his left inattention. Feel based on the information gathered from today's session he would be better served from a rehab standpoint by going to inpatient rehab to get him safely able to return to his mother's house and then back to [place] with fiance.  Follow Up Recommendations  CIR;Supervision/Assistance - 24 hour    Equipment Recommendations  3 in 1 bedside comode    Recommendations for Other Services Rehab consult    Precautions / Restrictions Precautions Precautions: Fall Precaution Comments: L UE>LE weakness Restrictions Weight Bearing Restrictions: No       Mobility Bed Mobility Overal bed mobility: Needs Assistance Bed Mobility: Supine to Sit     Supine to sit: Supervision     General bed mobility comments: Pt was entangled in his lines when I arrived--was not  aware  Transfers Overall transfer level: Needs assistance   Transfers: Sit to/from Stand Sit to Stand: Min assist         General transfer comment: Pt ambulated with me 40 feet with min A barely skirting around objects on his left at times    Balance Overall balance assessment: Needs assistance Sitting-balance support: No upper extremity supported;Feet supported Sitting balance-Leahy Scale: Good       Standing balance-Leahy Scale: Fair                     ADL  Pt having difficulty from proprioception standpoint of doffing and donning his socks--losing grip as well as having difficulty visually attending to task.                                              Vision                 Additional Comments: Although pt checked out for formal vision testing yesterday, today functionally he is missing seeing things on his left and just barely skirting around object on his left with ambulation.                Exercises Other Exercises Other Exercises: Started with some basic LUE exercises and activties today (opening/closing hand, spreading fingers apart, finger opposition, finger lifts)--pt had a hard time following them even with cues to do them correctly (was not  able to self monitor very well). Same with theraputty exercises (roll into ball, flatten into a pancake, roll into a log, pinch along the log)--he could tell me each step, but at times he would switch to using RUE and I would have to cue him to do them with LUE.           Pertinent Vitals/ Pain       Pain Assessment: No/denies pain            Progress Toward Goals  OT Goals(current goals can now be found in the care plan section)  Progress towards OT goals: Progressing toward goals (pt still requiring min A (or more with PT after talking to them) when he is up on his feet)     Plan Discharge plan needs to be updated       End of Session Equipment Utilized During Treatment:  Gait belt   Activity Tolerance  (pt more fatigued today--possibly from procedure this AM)   Patient Left  (with PT getting ready to see him)   Nurse Communication          Time: 1325-1401 OT Time Calculation (min): 36 min  Charges: OT General Charges $OT Visit: 1 Procedure OT Treatments $Therapeutic Activity: 8-22 mins $Therapeutic Exercise: 8-22 mins  Almon Register [id number] 06/22/2015, 2:23 PM

## 2015-06-22 NOTE — Anesthesia Procedure Notes (Signed)
Procedure Name: Intubation Date/Time: 06/22/2015 7:37 AM Performed by: Merrilyn Puma B Pre-anesthesia Checklist: Patient identified, Emergency Drugs available, Suction available, Timeout performed and Patient being monitored Patient Re-evaluated:Patient Re-evaluated prior to inductionOxygen Delivery Method: Circle system utilized Preoxygenation: Pre-oxygenation with 100% oxygen Intubation Type: IV induction and Cricoid Pressure applied Ventilation: Mask ventilation without difficulty Laryngoscope Size: Mac and 4 Grade View: Grade III Tube size: 7.5 mm Number of attempts: 1 Airway Equipment and Method: Stylet Placement Confirmation: CO2 detector,  positive ETCO2,  ETT inserted through vocal cords under direct vision and breath sounds checked- equal and bilateral Secured at: 24 cm Tube secured with: Tape Dental Injury: Teeth and Oropharynx as per pre-operative assessment

## 2015-06-22 NOTE — Op Note (Signed)
06/22/2015  8:04 AM  PATIENT:  George [name]  44 y.o. male  PRE-OPERATIVE DIAGNOSIS:  PD cath non functioning  POST-OPERATIVE DIAGNOSIS:  non functional PD cath  PROCEDURE:  Procedure(s): CONTINUOUS AMBULATORY PERITONEAL DIALYSIS  (CAPD) CATHETER REMOVAL (N/A)  SURGEON:  Surgeon(s) and Role:    * Ralene Ok, MD - Primary  ANESTHESIA:   local and general  EBL:  Total I/O In: 300 [I.V.:300] Out: 5 [Blood:<5]  BLOOD ADMINISTERED:none  DRAINS: none   LOCAL MEDICATIONS USED:  BUPIVICAINE   SPECIMEN:  No Specimen  DISPOSITION OF SPECIMEN:  N/A  COUNTS:  YES  TOURNIQUET:  * No tourniquets in log *  DICTATION: .Dragon Dictation After the patient was consented he was taken back to the operating room and placed in the supine position with bilateral SCDs in place. The patient was then prepped and draped in the usual sterile fashion. A timeout was called all facts were verified.  Quarter percent Marcaine with epi was in placed over the area of the subcutaneous cuff. A 1 cm incision was made over the area of the cuff was palpated. Electrocautery was used to maintain hemostasis and dissection was taken down to the cuff. The catheter was cut distal to the skin opening. This was fed back up through the incision site. The subcutaneous cuff was circumferentially dissected away from the surrounding tissue. At this time with traction the catheter was removed in its entirety. The pigtail portion of the catheter was intact. At this time the area of the incision site was reapproximated using a 3-0 Vicryl in interrupted fashion 1. The subcutaneous tissue was reapproximated using 3-0 Vicryl in interrupted fashion 2. The skin was reapproximated 4-0 Monocryl. The skin exit site was also reapproximated in a 4-0 Monocryl. The skin was dressed with Dermabond. The patient tolerated the procedure well was taken to the recovery room in stable condition.  PLAN OF CARE: Admit to inpatient   PATIENT  DISPOSITION:  PACU - hemodynamically stable.   Delay start of Pharmacological VTE agent (>24hrs) due to surgical blood loss or risk of bleeding: no

## 2015-06-22 NOTE — Progress Notes (Signed)
STROKE TEAM PROGRESS NOTE   HISTORY George [name] is a 44 y.o. male with a history of ESRD 2/2 HTN, DM, HTN current smoker who presented 06/15/2015  with SOB and CP and found to be very uremic. He also endorses that he had sudden onset dizziness, and increased confusion in the middle of last week. His Fiance states that at least since Sunday, he has had a left facial droop. He has been drowsy with some dysarthria, but this was attributed to uremia. He had no difficuly with walking. His LKW was unclear, likely the middle of last week, either 12/14 or 12/15. Neurology was consulted 06/20/2015. Patient was not administered TPA secondary to delay in arrival, unclear time of onset.   SUBJECTIVE (INTERVAL HISTORY) His occupational therapist is at the bedside.  Overall he feels his condition is improving.  He had upper endoscopy yesterday which showed evidence of mild erosive gastritis  OBJECTIVE Temp:  [97.7 F (36.5 C)-98.5 F (36.9 C)] 97.7 F (36.5 C) (12/23 1200) Pulse Rate:  [87-105] 105 (12/23 1200) Cardiac Rhythm:  [-] Normal sinus rhythm (12/23 0940) Resp:  [12-22] 12 (12/23 1200) BP: (121-164)/(65-106) 121/75 mmHg (12/23 1200) SpO2:  [95 %-100 %] 96 % (12/23 1200) Weight:  [190 lb 14.7 oz (86.6 kg)-199 lb 8.3 oz (90.5 kg)] 193 lb 2 oz (87.6 kg) (12/23 0427)  CBC:   Recent Labs Lab 06/21/15 0241 06/22/15 0327  WBC 13.0* 12.5*  HGB 8.5* 8.4*  HCT 25.6* 25.1*  MCV 90.5 90.3  PLT 163 0000000    Basic Metabolic Panel:   Recent Labs Lab 06/21/15 0241 06/22/15 0327  NA 138 137  K 4.9 4.6  CL 101 99*  CO2 22 26  GLUCOSE 99 92  BUN 107* 71*  CREATININE 20.61* 15.82*  CALCIUM 8.3* 8.1*  PHOS 9.6* 7.0*    Lipid Panel:     Component Value Date/Time   CHOL 91 06/21/2015 0241   TRIG 107 06/21/2015 0241   HDL 17* 06/21/2015 0241   CHOLHDL 5.4 06/21/2015 0241   VLDL 21 06/21/2015 0241   LDLCALC 53 06/21/2015 0241   HgbA1c:  Lab Results  Component Value Date   HGBA1C 5.6  06/21/2015   Urine Drug Screen: No results found for: LABOPIA, COCAINSCRNUR, LABBENZ, AMPHETMU, THCU, LABBARB    IMAGING  Ct Head Wo Contrast 06/20/2015   There is a large low-density area occupying the right frontal and temporal lobes suggestive for an infarct. There is thrombus in the right MCA at the right sylvian fissure and a small amount of sulci effacement around the infarct. Findings are suggestive for early subacute infarct in the right MCA territory. No evidence for acute hemorrhage or hemorrhagic transformation.   MRI and MRA Brain Wo Contrast 06/20/2015   1. Acute nonhemorrhagic infarct involving the right frontal operculum, insular cortex, right lentiform nucleus, and superior temporal gyrus. 2. Marked attenuation multiple M3 branch vessels associated with the infarct. 3. Focal mass effect with 1 - 2 mm midline shift and partial effacement of the right lateral ventricle. Mass effect is somewhat atypical. No discrete mass lesion is present. Follow-up MRI in 1-2 months is recommended to assure no underlying mass lesion and expected evolution of the infarct. 4. Age advanced periventricular white matter disease reflects chronic microvascular ischemia. 5. Focal age advanced white matter changes within the left corona radiata. 6. Remote lacunar infarcts in the cerebellum bilaterally.   Dg Chest Port 1 View 06/19/2015  Tip of dialysis catheter projects in the right  atrium. Negative for pneumothorax. Improved right pleural effusion and basilar airspace disease.   2D Echocardiogram  - Left ventricle: The cavity size was normal. Wall thickness wasincreased in a pattern of severe LVH. Systolic function wasnormal. The estimated ejection fraction was in the range of 60%to 65%. Doppler parameters are consistent with abnormal leftventricular relaxation (grade 1 diastolic dysfunction). - Mitral valve: Severe nodular posterior annular calcification.Valve area by continuity equation (using LVOT  flow): 2.66 cm^2. - Left atrium: The atrium was moderately dilated. - Atrial septum: No defect or patent foramen ovale was identified.  Bilateral lower extremity venous duplex no DVT or SVT noted in the bilateral lower extremities.   Carotid Doppler  Bilateral: No significant (1-39%) ICA stenosis. Antegrade vertebral flow.      PHYSICAL EXAM Pleasant middle-aged African-American male not in distress. . Afebrile. Head is nontraumatic. Neck is supple without bruit.    Cardiac exam no murmur or gallop. Lungs are clear to auscultation. Distal pulses are well felt. Neurological Exam : Awake alert oriented 3 normal language and mild dysarthria but soft voice. Can be understood with some difficulty. Extraocular movements are full range without nystagmus. Pupils are equal reactive. Fundi were not visualized. Visual fields seem full to bedside confrontational testing. Mild left lower facial weakness. Tongue midline. Motor system exam revealed mild left hemiplegia with 4/5 strength with significant weakness of left grip, intrinsic hand muscles, left hip flexors and ankle dorsiflexors. Deep tendon reflexes are 1+ symmetric except ankle drugs are depressed. Plantars are downgoing. Sensation is preserved bilaterally. Except for diminished sensation over the ankles and toes ASSESSMENT/PLAN George [name] is a 44 y.o. male with history of ESRD 2/2 HTN, DM, HTN current smoker who initially presented to [facility] with SOB and CP and found to be uremic. During hospitalization He did not receive IV t-PA due to  delay in arrival, unclear time of onset.   Stroke:  Non-dominant right frontal operculum, insular cortex, right lentiform nucleus, and superior temporal gyrus infarct embolic secondary to known atrial fibrillation   MRI  right frontal operculum, insular cortex, right lentiform nucleus, and superior temporal gyrus with atypical mass effect. small vessel disease. White matter changes L corona radiata. Old B  cerebellar lacunes.  MRA  Attenuated M3 vessels associated with infarct.  Carotid Doppler  Bilateral: No significant (1-39%) ICA stenosis. Antegrade vertebral flow.   Lower extremity venous Dopplers negative  2D Echo  No source of embolus   LDL 53  HgbA1c 5.4  SCDs for VTE prophylaxis Diet renal/carb modified with fluid restriction Diet-HS Snack?: Nothing; Room service appropriate?: Yes; Fluid consistency:: Thin  aspirin 81 mg daily prior to admission, now on No antithrombotics given GIB. Await GI evaluation prior to resumption of aspirin. Consider anticoagulation. Atrial fibrillation, if/once stable  Ongoing aggressive stroke risk factor management  Follow up MRI in 1-2 mos to evaluate mass effect  Therapy recommendations:  Outpatient PT and OT  Disposition:  pending   Atrial Fibrillation  Home anticoagulation:  None CHA2DS2-VASc Score =  ?2 oral anticoagulation recommended Not an anticoagulation candidate at this time secondary to anemia/bleeding. Reconsider once GI workup completed  Hypertension  Stable  Hyperlipidemia  Home meds:  Pravachol 20, resumed in hospital  LDL 53, goal < 70  Continue statin at discharge  Diabetes  HgbA1c 5.4, at goal < 7.0  Other Stroke Risk Factors  Cigarette smoker, advised to stop smoking  THC use  Family hx stroke (grandmother)  Other Active Problems  UREMIA/UNDER DIALYSIS/END-STAGE RENAL  DISEASE ON PERITONEAL DIALYSIS  Hematemesis  Anemia/acute blood loss anemia  Elevated troponins  Shortness of breath  Acute on chronic encephalopathy  Hospital day # [place] for Pager information 06/22/2015 1:59 PM  I have personally examined this patient, reviewed notes, independently viewed imaging studies, participated in medical decision making and plan of care. I have made any additions or clarifications directly to the above note.  He presented with subacute symptoms of  confusion, dizziness, dysarthria likely right MCA branch infarct of embolic etiology.   Patient apparently has remote history of atrial fibrillation but this needs to be verified prior to starting anticoagulation. And only after GI workup for source of bleeding is negative. We'll try to get his prior medical records from [place].Start aspirin 81 mg daily if ok with GI Antony Contras, MD Medical Director [place] Pager: [phone] 06/22/2015 1:59 PM    To contact Stroke Continuity provider, please refer to [email or web address] After hours, contact General Neurology Following by is

## 2015-06-22 NOTE — Progress Notes (Signed)
Physical Therapy Treatment Patient Details Name: George [name] MRN: [id number] DOB: [birth year] Today's Date: 06/22/2015    History of Present Illness George [name] is a 44 y.o. male with a history of ESRD 2/2 HTN, DM, HTN current smoker who presented with SOB and CP and found to be very uremic. He also endorses that he had sudden onset dizziness, and increased confusion in the middle of last week. MRI shows acute infarct involving the right frontal operculum, insular cortex, right lentiform nucleus, and superior temporal gyrus. Remote lacunar infarcts in the cerebellum noted bilaterally.     PT Comments    Pt admitted with above diagnosis. Pt currently with functional limitations due to balance and endurance deficits as well as left inattention affecting his mobility.  Pt very fatigued this pm.  Pt with inattention of left hemibody running into objects.  Also, needed mod assist to perform stairs as he was impulsive and unless cued properly, tripped on steps.  Pt also had a significant LOB while standing statically needing mod assist.  Tye [place], OT called Rehab and asked for them to look at pt as he really would benefit from stay.  Pt will benefit from skilled PT to increase their independence and safety with mobility to allow discharge to the venue listed below.    Follow Up Recommendations  CIR;Supervision/Assistance - 24 hour     Equipment Recommendations  Other (comment) (TBA)    Recommendations for Other Services Rehab consult     Precautions / Restrictions Precautions Precautions: Fall Precaution Comments: L UE>LE weakness, left in attention Restrictions Weight Bearing Restrictions: No    Mobility  Bed Mobility Overal bed mobility: Needs Assistance Bed Mobility: Supine to Sit;Sit to Supine     Supine to sit: Supervision Sit to supine: Supervision   General bed mobility comments: Pt impulsive with sit to supine.  Pt was tired.  Poor awareness overall.   Transfers Overall  transfer level: Needs assistance Equipment used: None Transfers: Sit to/from Stand Sit to Stand: Min assist         General transfer comment:   06/22/15 1422  Transfers  General transfer comment Demonstrates strength to rise from bed  with assist for steadying pt; pt with need for steadying assist due to left inattention.  Pt running into objects on left unless cued.      Ambulation/Gait Ambulation/Gait assistance: Min assist;Mod assist Ambulation Distance (Feet): 150 Feet Assistive device: None Gait Pattern/deviations: Step-through pattern;Decreased stride length;Decreased weight shift to left;Staggering left;Staggering right     General Gait Details: Pt was able to ambulate without device however was losing balance at times needing mod assist and cues due to left inattention and left LE weakness.     Stairs Stairs: Yes Stairs assistance: Mod assist Stair Management: Step to pattern;One rail Right;Forwards Number of Stairs: 4 General stair comments: Pt impulsively began to go up steps before PT could cue pt and pt needed mod assist to recover as he was going up right his left foot first and was catching it on steps as he went.  Once pt slowed down, PT cued pt correctly and he was able to go up and down steps using the correct feet and needed min guard.    Wheelchair Mobility    Modified Rankin (Stroke Patients Only)       Balance Overall balance assessment: Needs assistance Sitting-balance support: No upper extremity supported;Feet supported Sitting balance-Leahy Scale: Good       Standing balance-Leahy Scale: Fair Standing balance comment:  pt loses balance to left at times.  Was standing at nursing desk talking to OT and pt lost balance to left needign mod assist while standing statically.  Pt with left LE weakness and inattention affecting balance.                      Cognition Arousal/Alertness: Awake/alert Behavior During Therapy: WFL for tasks  assessed/performed Overall Cognitive Status:  (slower to respond today at times, may be due to procedure this AM and still drowsy. Also some difficulty following 1 step commands (needing more than 1 cue at time))                      Exercises    General Comments        Pertinent Vitals/Pain Pain Assessment: No/denies pain  VSS    Home Living                      Prior Function            PT Goals (current goals can now be found in the care plan section) Progress towards PT goals: Progressing toward goals    Frequency  Min 4X/week    PT Plan Discharge plan needs to be updated    Co-evaluation             End of Session Equipment Utilized During Treatment: Gait belt Activity Tolerance: Patient limited by fatigue Patient left: in bed;with call bell/phone within reach     Time: 1352-1413 PT Time Calculation (min) (ACUTE ONLY): 21 min  Charges:  $Gait Training: 8-22 mins                    G CodesDenice Paradise 2015/06/24, 2:40 PM [facility] [facility] [phone] (pager)

## 2015-06-22 NOTE — Progress Notes (Signed)
Rehab Admissions Coordinator Note:  Patient was screened by Retta Diones for appropriateness for an Inpatient Acute Rehab Consult.  Noted OT now recommending inpatient rehab consult. At this time, we are recommending Inpatient Rehab consult.  Retta Diones 06/22/2015, 2:43 PM  I can be reached at [phone].

## 2015-06-22 NOTE — Anesthesia Postprocedure Evaluation (Signed)
Anesthesia Post Note  Patient: George [name]  Procedure(s) Performed: Procedure(s) (LRB): CONTINUOUS AMBULATORY PERITONEAL DIALYSIS  (CAPD) CATHETER REMOVAL (N/A)  Patient location during evaluation: PACU Anesthesia Type: General Level of consciousness: awake and alert, oriented and patient cooperative Pain management: pain level controlled Vital Signs Assessment: post-procedure vital signs reviewed and stable Respiratory status: spontaneous breathing, nonlabored ventilation and respiratory function stable Cardiovascular status: blood pressure returned to baseline and stable Postop Assessment: no signs of nausea or vomiting Anesthetic complications: no    Last Vitals:  Filed Vitals:   06/22/15 0845 06/22/15 0900  BP: 139/77 141/75  Pulse: 90 95  Temp:    Resp: 19 18    Last Pain:  Filed Vitals:   06/22/15 0905  PainSc: Asleep                 Sherard Sutch,E. Geeta Dworkin

## 2015-06-22 NOTE — Progress Notes (Signed)
[facility] PROGRESS NOTE  George [name] [id number] DOB: 12-08-1970 DOA: 06/15/2015 PCP: No primary care provider on file.   brief interval history George [name] is a 44 y.o. male with a history of ESRD on Peritoneal dialysis, DM2, and HTN who presents to the ED with complaints of right sided chest pain and SOB. He was found to have hypoxemia in the ED to 85%. He was placed on NCO2 at 2 liters. He was referred for further workup.   Patient was admitted treated for uremia secondary to under dialysis with CCPD  Per renal. Patient also noted during the hospitalization to have coffee-ground emesis was placed on a Protonix drip and transfused 2 units packed red blood cells. GI was consulted who followed the patient however patient initially refused upper endoscopy. But he underwent it on 12/22 and was found to have esophagitis. He was started on PPI.    Assessment/Plan: #1 uremia/under dialysis/end-stage renal disease on peritoneal dialysis Patient presented with uremia, probably from missing peritoneal dialysis. Patient denies any chest pain. Patient denies any shortness of breath.Patient on peritoneal dialysis. Patient had vein mapping and and  for access placement and tunneled dialysis catheter placement on 12/20 per vascular surgery for transition to HD. Per nephrology. Removal of PD catheter on 12/23.   #2 hematemesis/coffee-ground emesis Patient with coffee-ground emesis on 06/17/2015. Patient endorsed recent use of ibuprofen 800 mg 4 times daily for right leg pain. Patient denies daily chronic alcohol use. Per wife patient with prior history of duodenal ulcer. Continue Protonix drip. Patient status post 2 units packed red blood cells . Patient was seen in consultation by gastroenterology, Dr. Penelope Coop who had recommended an upper endoscopy however patient refused initially but agreed to it. Transfusion threshold hemoglobin less than 7. GI following and appreciate input and recommendations.  He was scheduled for EGD on 12/21, but his CT head revealed to have a subacute CVA. His EGD was postponed to 12/22. He underwent EGD today, showed mild erosive esophagitis.   #3 hypertension Continue Norvasc, digoxin, BiDil, Cozaar.Change IV Lopressor to home dose beta blocker.  #4 well-controlled diabetes mellitus Patient on oral diabetic medications at home. Hemoglobin A1c = 5.4. Continue sliding scale insulin. Hold oral hypoglycemic agents. Follow.  #5 anemia/acute blood loss anemia Likely multifactorial secondary to acute blood loss anemia and anemia of chronic disease. Anemia panel with iron of 145 TIBC of 200 ferritin of 846 consistent with anemia of chronic disease likely secondary to end-stage renal disease on hemodialysis. Patient with coffee-ground emesis on 06/17/2015. He was also found to have melanotic stools on 12/20 .  Patient status post 2 units packed red blood cells.  Continue Protonix drip. Transfusion threshold hemoglobin less than 7. Underwent EGD showed mild erosive esophagitis. Recommend to continue the PPI.  #6 elevated troponins Patient denies any acute chest pain. Troponins trending down..If acute coronary syndrome may be secondary to problem #1. 2-D echo with EF of 60-65% with severe LVH, grade 1 diastolic dysfunction. Severe nodular posterior annular calcification on the mitral valve. Continue beta blocker, ARB, BiDil, digoxin. Follow.  #7 shortness of breath Likely secondary to hypervolemia and under hemodialysis. VQ scan negative for PE. Lower extremity Dopplers negative for DVT or SVT. Patient on CPPD per renal to be converted to HD.  #8 prophylaxis PPI for GI prophylaxis. SCDs for DVT prophylaxis.   #9 acute on chronic encephalopathy: Dr Deterding recommended CT head, which revealed a sub acute right MCA stroke. Stroke work up ordered with an MRI of  the brain and MRA head and neck.  MRi brain showed acute non hemorrhagic infarct involving the right frontal  operculum , insular cortex, rigth lentiform nucleus and superior temporal gyrus, marked attenuation multiple M3 branch vessels. Focal mass effect with 1 to2 mm midline shift and partial effacement of the right lateral ventricle. Mass effect is atypical. Recommend follow up MRI in 1 to 2 months, to assure no underlying mass lesion and expected evolution of the infarct. Also found remote lacunar infarcts int he cerebellum bilaterally. Carotid duplex showed no significant ICA stenosis. Echocardiogram from 12/19 showed EF of 65% with grade 1 diastolic dysfunction. Speech recommended, continued outpatient SLP eval.  Neurology consulted and recommendations given. There was a question of atrial fibrillation in the past. Discussed with the patient and RN, plan to obtain records from [place] to start eliquis as soon as possible.    Code Status: Full Family Communication: none at bedside. Disposition Plan: pending PT eval.    Consultants:  Nephrology: Dr. Jonnie Finner 06/16/2015  Gastroenterology: Dr. Penelope Coop 06/17/2015  Neurology   Procedures:  V/Q scan 06/16/2015  CXR 06/16/2015  2-D echo 06/18/2015  2 units packed red blood cells 06/17/2015  MRI brain  MRA head and neck.   Antibiotics:  None  HPI/Subjective: No chest pain, sob. No melanotic stools today. Hemoglobin is  Stable.   Objective: Filed Vitals:   06/22/15 0915 06/22/15 1000  BP: 139/81 164/106  Pulse: 95 98  Temp: 97.8 F (36.6 C)   Resp: 16 18    Intake/Output Summary (Last 24 hours) at 06/22/15 1046 Last data filed at 06/22/15 0759  Gross per 24 hour  Intake   1260 ml  Output   1505 ml  Net   -245 ml   Filed Weights   06/21/15 1546 06/21/15 1907 06/22/15 0427  Weight: 90.5 kg (199 lb 8.3 oz) 86.6 kg (190 lb 14.7 oz) 87.6 kg (193 lb 2 oz)    Exam:   General:  Comfortable.   Cardiovascular: RRR  Respiratory: CTAB  Abdomen: Soft, nontender, nondistended, positive bowel sounds.  Musculoskeletal: No  clubbing cyanosis or edema.  Data Reviewed: Basic Metabolic Panel:  Recent Labs Lab 06/19/15 0310 06/19/15 1600 06/20/15 0500 06/21/15 0241 06/22/15 0327  NA 142 144 140 138 137  K 3.9 4.5 4.5 4.9 4.6  CL 104 105 105 101 99*  CO2 22 24 23 22 26   GLUCOSE 223* 93 98 99 92  BUN 134* 113* 92* 107* 71*  CREATININE 22.20* 20.64* 17.38* 20.61* 15.82*  CALCIUM 8.5* 8.2*  7.7* 8.4* 8.3* 8.1*  PHOS 7.6* 8.7* 7.3* 9.6* 7.0*   Liver Function Tests:  Recent Labs Lab 06/19/15 1600 06/20/15 0307 06/20/15 0500 06/21/15 0241 06/22/15 0327  AST  --  34  --   --   --   ALT  --  31  --   --   --   ALKPHOS  --  57  --   --   --   BILITOT  --  0.4  --   --   --   PROT  --  6.5  --   --   --   ALBUMIN 2.7* 2.7* 2.7* 2.4* 2.4*   No results for input(s): LIPASE, AMYLASE in the last 168 hours. No results for input(s): AMMONIA in the last 168 hours. CBC:  Recent Labs Lab 06/19/15 1600 06/19/15 1807 06/20/15 0307 06/20/15 0857 06/20/15 1447 06/20/15 2028 06/21/15 0241 06/22/15 0327  WBC 11.2* 13.0* 15.7*  --   --   --  13.0* 12.5*  HGB 7.3* 8.6* 9.6* 8.8* 8.8* 8.7* 8.5* 8.4*  HCT 21.2* 24.8* 29.0* 26.0* 26.9* 26.2* 25.6* 25.1*  MCV 88.3 88.3 89.5  --   --   --  90.5 90.3  PLT 169 191 144*  --   --   --  163 175   Cardiac Enzymes:  Recent Labs Lab 06/16/15 1352 06/16/15 1910 06/17/15 0543 06/17/15 1211 06/17/15 2343  TROPONINI 0.10* 0.07* 0.08* 0.08* 0.10*   BNP (last 3 results) No results for input(s): BNP in the last 8760 hours.  ProBNP (last 3 results) No results for input(s): PROBNP in the last 8760 hours.  CBG:  Recent Labs Lab 06/21/15 1001 06/21/15 2000 06/22/15 0024 06/22/15 0432 06/22/15 0830  GLUCAP 122* 84 115* 90 85    Recent Results (from the past 240 hour(s))  MRSA PCR Screening     Status: None   Collection Time: 06/16/15  3:47 PM  Result Value Ref Range Status   MRSA by PCR NEGATIVE NEGATIVE Final    Comment:        The GeneXpert MRSA  Assay (FDA approved for NASAL specimens only), is one component of a comprehensive MRSA colonization surveillance program. It is not intended to diagnose MRSA infection nor to guide or monitor treatment for MRSA infections.      Studies: Ct Head Wo Contrast  06/20/2015  CLINICAL DATA:  Confusion. EXAM: CT HEAD WITHOUT CONTRAST TECHNIQUE: Contiguous axial images were obtained from the base of the skull through the vertex without contrast. COMPARISON:  None FINDINGS: Mildly ill-defined low density involving the right frontal-temporal lobe. This area measures up 3.7 cm in the AP dimension. There is a small amount of sulcal effacement surrounding this hypodensity. There is no evidence for midline shift or hydronephrosis. No evidence for acute hemorrhage. There is a hyperdense MCA sign in the right sylvian fissure. Visualized mastoid air cells are clear. Minimal mucosal disease in the left maxillary sinus. Otherwise, the visualized paranasal sinuses are clear. No calvarial fracture. Focal low-density structure in the left parietal-occipital region could represent an old insult or infarct. IMPRESSION: There is a large low-density area occupying the right frontal and temporal lobes suggestive for an infarct. There is thrombus in the right MCA at the right sylvian fissure and a small amount of sulci effacement around the infarct. Findings are suggestive for early subacute infarct in the right MCA territory. No evidence for acute hemorrhage or hemorrhagic transformation. These results were called by telephone at the time of interpretation on 06/20/2015 at 12:25 pm to the patient's nurse, Vicente Males, who verbally acknowledged these results. Electronically Signed   By: Markus Daft M.D.   On: 06/20/2015 12:33   Mr Jodene Nam Head Wo Contrast  06/20/2015  CLINICAL DATA:  Sudden onset of dizziness and increased confusion. Left-sided facial droop. Personal history of end-stage renal disease, hypertension, diabetes. The  examination had to be discontinued prior to completion due to patient refusal low tear the holes left. EXAM: MRI HEAD WITHOUT CONTRAST MRA HEAD WITHOUT CONTRAST TECHNIQUE: Multiplanar, multiecho pulse sequences of the brain and surrounding structures were obtained without intravenous contrast. Angiographic images of the head were obtained using MRA technique without contrast. COMPARISON:  CT head without contrast from the same day. FINDINGS: MRI HEAD FINDINGS Acute nonhemorrhagic infarct is evident within the right frontal operculum, insular cortex, right lentiform nucleus, and superior temporal gyrus. There is associated T2 signal change and mass effect with partial effacement of the right lateral ventricle. Midline shift  of 1-2 mm is noted. Mild periventricular white matter changes are noted bilaterally. There is focal T2 signal abnormality within the left coronal radiata. There well-formed cystic lesion is present in the left occipital lobe. No blood products are present. The internal auditory canals are within normal limits bilaterally. The brainstem is within normal limits. Remote lacunar infarcts are present in the cerebellum bilaterally. Flow is present in the major intracranial arteries. Bilateral lens replacements are present. The paranasal sinuses are clear. The globes and orbits are otherwise intact. The skullbase is within normal limits. Midline sagittal images are within normal limits. MRA HEAD FINDINGS There is marked attenuation of several M3 branch vessels on the right associated with the infarct. No proximal stenosis or occlusion is present. The left MCA branch vessels are intact. ACA branch vessels are normal bilaterally. The right vertebral artery is the dominant vessel. The right PICA origin is visualized and normal. AICA vessels are noted bilaterally. The basilar artery is normal. Both posterior cerebral arteries originate from the basilar tip. The PCA branch vessels are intact. IMPRESSION: 1.  Acute nonhemorrhagic infarct involving the right frontal operculum, insular cortex, right lentiform nucleus, and superior temporal gyrus. 2. Marked attenuation multiple M3 branch vessels associated with the infarct. 3. Focal mass effect with 1 - 2 mm midline shift and partial effacement of the right lateral ventricle. Mass effect is somewhat atypical. No discrete mass lesion is present. Follow-up MRI in 1-2 months is recommended to assure no underlying mass lesion and expected evolution of the infarct. 4. Age advanced periventricular white matter disease reflects chronic microvascular ischemia. 5. Focal age advanced white matter changes within the left corona radiata. 6. Remote lacunar infarcts in the cerebellum bilaterally. Electronically Signed   By: San Morelle M.D.   On: 06/20/2015 17:01   Mr Brain Wo Contrast  06/20/2015  CLINICAL DATA:  Sudden onset of dizziness and increased confusion. Left-sided facial droop. Personal history of end-stage renal disease, hypertension, diabetes. The examination had to be discontinued prior to completion due to patient refusal low tear the holes left. EXAM: MRI HEAD WITHOUT CONTRAST MRA HEAD WITHOUT CONTRAST TECHNIQUE: Multiplanar, multiecho pulse sequences of the brain and surrounding structures were obtained without intravenous contrast. Angiographic images of the head were obtained using MRA technique without contrast. COMPARISON:  CT head without contrast from the same day. FINDINGS: MRI HEAD FINDINGS Acute nonhemorrhagic infarct is evident within the right frontal operculum, insular cortex, right lentiform nucleus, and superior temporal gyrus. There is associated T2 signal change and mass effect with partial effacement of the right lateral ventricle. Midline shift of 1-2 mm is noted. Mild periventricular white matter changes are noted bilaterally. There is focal T2 signal abnormality within the left coronal radiata. There well-formed cystic lesion is present  in the left occipital lobe. No blood products are present. The internal auditory canals are within normal limits bilaterally. The brainstem is within normal limits. Remote lacunar infarcts are present in the cerebellum bilaterally. Flow is present in the major intracranial arteries. Bilateral lens replacements are present. The paranasal sinuses are clear. The globes and orbits are otherwise intact. The skullbase is within normal limits. Midline sagittal images are within normal limits. MRA HEAD FINDINGS There is marked attenuation of several M3 branch vessels on the right associated with the infarct. No proximal stenosis or occlusion is present. The left MCA branch vessels are intact. ACA branch vessels are normal bilaterally. The right vertebral artery is the dominant vessel. The right PICA origin is visualized  and normal. AICA vessels are noted bilaterally. The basilar artery is normal. Both posterior cerebral arteries originate from the basilar tip. The PCA branch vessels are intact. IMPRESSION: 1. Acute nonhemorrhagic infarct involving the right frontal operculum, insular cortex, right lentiform nucleus, and superior temporal gyrus. 2. Marked attenuation multiple M3 branch vessels associated with the infarct. 3. Focal mass effect with 1 - 2 mm midline shift and partial effacement of the right lateral ventricle. Mass effect is somewhat atypical. No discrete mass lesion is present. Follow-up MRI in 1-2 months is recommended to assure no underlying mass lesion and expected evolution of the infarct. 4. Age advanced periventricular white matter disease reflects chronic microvascular ischemia. 5. Focal age advanced white matter changes within the left corona radiata. 6. Remote lacunar infarcts in the cerebellum bilaterally. Electronically Signed   By: San Morelle M.D.   On: 06/20/2015 17:01    Scheduled Meds: . sodium chloride   Intravenous Once  . sodium chloride   Intravenous Once  . acetaminophen   650 mg Oral Once  . acetaminophen  650 mg Oral Once  . calcium acetate  667 mg Oral TID WC  . carvedilol  12.5 mg Oral BID WC  . [START ON 06/25/2015] darbepoetin (ARANESP) injection - DIALYSIS  200 mcg Intravenous Q Mon-HD  . digoxin  0.125 mg Oral QODAY  . diphenhydrAMINE  25 mg Oral Once  . diphenhydrAMINE  25 mg Oral Once  . gentamicin cream  1 application Topical Daily  . heparin  40 Units/kg Dialysis Once in dialysis  . insulin aspart  0-9 Units Subcutaneous 6 times per day  . isosorbide-hydrALAZINE  1 tablet Oral TID  . lanthanum  1,000 mg Oral TID WC  . multivitamin  1 tablet Oral Daily  . pantoprazole (PROTONIX) IV  40 mg Intravenous Q12H  . pravastatin  20 mg Oral QPM  . sodium chloride  3 mL Intravenous Q12H   Continuous Infusions: . sodium chloride 40 mL/hr at 06/21/15 2247    Principal Problem:   Uremia Active Problems:   Hypoxia   Acute respiratory failure (HCC)   ESRD on peritoneal dialysis ([place])   Diabetes mellitus without complication ([place])   Essential hypertension   Pleuritic chest pain   SOB (shortness of breath)   Hematemesis   Hematemesis with nausea   ESRD (end stage renal disease) on dialysis (HCC)   Right leg pain   Gastrointestinal hemorrhage with melena   Confusion    Time spent: [place] MD [facility] Pager [phone]. If 7PM-7AM, please contact night-coverage at [email or web address], password [facility] 06/22/2015, 10:46 AM  LOS: 6 days

## 2015-06-22 NOTE — Progress Notes (Signed)
[facility] ROUNDING NOTE   Subjective:   Interval History:  Removal of PD catheter this morning  Objective:  Vital signs in last 24 hours:  Temp:  [97.7 F (36.5 C)-98.5 F (36.9 C)] 97.7 F (36.5 C) (12/23 1200) Pulse Rate:  [87-105] 105 (12/23 1200) Resp:  [12-22] 12 (12/23 1200) BP: (121-164)/(65-106) 121/75 mmHg (12/23 1200) SpO2:  [95 %-100 %] 96 % (12/23 1200) Weight:  [86.6 kg (190 lb 14.7 oz)-90.5 kg (199 lb 8.3 oz)] 87.6 kg (193 lb 2 oz) (12/23 0427)  Weight change: 0.7 kg (1 lb 8.7 oz) Filed Weights   06/21/15 1546 06/21/15 1907 06/22/15 0427  Weight: 90.5 kg (199 lb 8.3 oz) 86.6 kg (190 lb 14.7 oz) 87.6 kg (193 lb 2 oz)    Intake/Output: I/O last 3 completed shifts: In: 1283 [I.V.:1283] Out: 1500 [Other:1500]   Intake/Output this shift:  Total I/O In: 300 [I.V.:300] Out: 5 [Blood:5]  CVS- RRR RS- CTA ABD- BS present soft non-distended EXT- no edema   Basic Metabolic Panel:  Recent Labs Lab 06/19/15 0310 06/19/15 1600 06/20/15 0500 06/21/15 0241 06/22/15 0327  NA 142 144 140 138 137  K 3.9 4.5 4.5 4.9 4.6  CL 104 105 105 101 99*  CO2 22 24 23 22 26   GLUCOSE 223* 93 98 99 92  BUN 134* 113* 92* 107* 71*  CREATININE 22.20* 20.64* 17.38* 20.61* 15.82*  CALCIUM 8.5* 8.2*  7.7* 8.4* 8.3* 8.1*  PHOS 7.6* 8.7* 7.3* 9.6* 7.0*    Liver Function Tests:  Recent Labs Lab 06/19/15 1600 06/20/15 0307 06/20/15 0500 06/21/15 0241 06/22/15 0327  AST  --  34  --   --   --   ALT  --  31  --   --   --   ALKPHOS  --  57  --   --   --   BILITOT  --  0.4  --   --   --   PROT  --  6.5  --   --   --   ALBUMIN 2.7* 2.7* 2.7* 2.4* 2.4*   No results for input(s): LIPASE, AMYLASE in the last 168 hours. No results for input(s): AMMONIA in the last 168 hours.  CBC:  Recent Labs Lab 06/19/15 1600 06/19/15 1807 06/20/15 0307 06/20/15 0857 06/20/15 1447 06/20/15 2028 06/21/15 0241 06/22/15 0327  WBC 11.2* 13.0* 15.7*  --   --   --   13.0* 12.5*  HGB 7.3* 8.6* 9.6* 8.8* 8.8* 8.7* 8.5* 8.4*  HCT 21.2* 24.8* 29.0* 26.0* 26.9* 26.2* 25.6* 25.1*  MCV 88.3 88.3 89.5  --   --   --  90.5 90.3  PLT 169 191 144*  --   --   --  163 175    Cardiac Enzymes:  Recent Labs Lab 06/16/15 1352 06/16/15 1910 06/17/15 0543 06/17/15 1211 06/17/15 2343  TROPONINI 0.10* 0.07* 0.08* 0.08* 0.10*    BNP: Invalid input(s): POCBNP  CBG:  Recent Labs Lab 06/21/15 2000 06/22/15 0024 06/22/15 0432 06/22/15 0830 06/22/15 1245  GLUCAP 84 115* 90 34 119*    Microbiology: Results for orders placed or performed during the hospital encounter of 06/15/15  MRSA PCR Screening     Status: None   Collection Time: 06/16/15  3:47 PM  Result Value Ref Range Status   MRSA by PCR NEGATIVE NEGATIVE Final    Comment:        The GeneXpert MRSA Assay (FDA approved for NASAL specimens only), is  one component of a comprehensive MRSA colonization surveillance program. It is not intended to diagnose MRSA infection nor to guide or monitor treatment for MRSA infections.     Coagulation Studies: No results for input(s): LABPROT, INR in the last 72 hours.  Urinalysis: No results for input(s): COLORURINE, LABSPEC, PHURINE, GLUCOSEU, HGBUR, BILIRUBINUR, KETONESUR, PROTEINUR, UROBILINOGEN, NITRITE, LEUKOCYTESUR in the last 72 hours.  Invalid input(s): APPERANCEUR    Imaging: Mr Virgel Paling Wo Contrast  06/20/2015  CLINICAL DATA:  Sudden onset of dizziness and increased confusion. Left-sided facial droop. Personal history of end-stage renal disease, hypertension, diabetes. The examination had to be discontinued prior to completion due to patient refusal low tear the holes left. EXAM: MRI HEAD WITHOUT CONTRAST MRA HEAD WITHOUT CONTRAST TECHNIQUE: Multiplanar, multiecho pulse sequences of the brain and surrounding structures were obtained without intravenous contrast. Angiographic images of the head were obtained using MRA technique without  contrast. COMPARISON:  CT head without contrast from the same day. FINDINGS: MRI HEAD FINDINGS Acute nonhemorrhagic infarct is evident within the right frontal operculum, insular cortex, right lentiform nucleus, and superior temporal gyrus. There is associated T2 signal change and mass effect with partial effacement of the right lateral ventricle. Midline shift of 1-2 mm is noted. Mild periventricular white matter changes are noted bilaterally. There is focal T2 signal abnormality within the left coronal radiata. There well-formed cystic lesion is present in the left occipital lobe. No blood products are present. The internal auditory canals are within normal limits bilaterally. The brainstem is within normal limits. Remote lacunar infarcts are present in the cerebellum bilaterally. Flow is present in the major intracranial arteries. Bilateral lens replacements are present. The paranasal sinuses are clear. The globes and orbits are otherwise intact. The skullbase is within normal limits. Midline sagittal images are within normal limits. MRA HEAD FINDINGS There is marked attenuation of several M3 branch vessels on the right associated with the infarct. No proximal stenosis or occlusion is present. The left MCA branch vessels are intact. ACA branch vessels are normal bilaterally. The right vertebral artery is the dominant vessel. The right PICA origin is visualized and normal. AICA vessels are noted bilaterally. The basilar artery is normal. Both posterior cerebral arteries originate from the basilar tip. The PCA branch vessels are intact. IMPRESSION: 1. Acute nonhemorrhagic infarct involving the right frontal operculum, insular cortex, right lentiform nucleus, and superior temporal gyrus. 2. Marked attenuation multiple M3 branch vessels associated with the infarct. 3. Focal mass effect with 1 - 2 mm midline shift and partial effacement of the right lateral ventricle. Mass effect is somewhat atypical. No discrete mass  lesion is present. Follow-up MRI in 1-2 months is recommended to assure no underlying mass lesion and expected evolution of the infarct. 4. Age advanced periventricular white matter disease reflects chronic microvascular ischemia. 5. Focal age advanced white matter changes within the left corona radiata. 6. Remote lacunar infarcts in the cerebellum bilaterally. Electronically Signed   By: San Morelle M.D.   On: 06/20/2015 17:01   Mr Brain Wo Contrast  06/20/2015  CLINICAL DATA:  Sudden onset of dizziness and increased confusion. Left-sided facial droop. Personal history of end-stage renal disease, hypertension, diabetes. The examination had to be discontinued prior to completion due to patient refusal low tear the holes left. EXAM: MRI HEAD WITHOUT CONTRAST MRA HEAD WITHOUT CONTRAST TECHNIQUE: Multiplanar, multiecho pulse sequences of the brain and surrounding structures were obtained without intravenous contrast. Angiographic images of the head were obtained using MRA technique  without contrast. COMPARISON:  CT head without contrast from the same day. FINDINGS: MRI HEAD FINDINGS Acute nonhemorrhagic infarct is evident within the right frontal operculum, insular cortex, right lentiform nucleus, and superior temporal gyrus. There is associated T2 signal change and mass effect with partial effacement of the right lateral ventricle. Midline shift of 1-2 mm is noted. Mild periventricular white matter changes are noted bilaterally. There is focal T2 signal abnormality within the left coronal radiata. There well-formed cystic lesion is present in the left occipital lobe. No blood products are present. The internal auditory canals are within normal limits bilaterally. The brainstem is within normal limits. Remote lacunar infarcts are present in the cerebellum bilaterally. Flow is present in the major intracranial arteries. Bilateral lens replacements are present. The paranasal sinuses are clear. The globes and  orbits are otherwise intact. The skullbase is within normal limits. Midline sagittal images are within normal limits. MRA HEAD FINDINGS There is marked attenuation of several M3 branch vessels on the right associated with the infarct. No proximal stenosis or occlusion is present. The left MCA branch vessels are intact. ACA branch vessels are normal bilaterally. The right vertebral artery is the dominant vessel. The right PICA origin is visualized and normal. AICA vessels are noted bilaterally. The basilar artery is normal. Both posterior cerebral arteries originate from the basilar tip. The PCA branch vessels are intact. IMPRESSION: 1. Acute nonhemorrhagic infarct involving the right frontal operculum, insular cortex, right lentiform nucleus, and superior temporal gyrus. 2. Marked attenuation multiple M3 branch vessels associated with the infarct. 3. Focal mass effect with 1 - 2 mm midline shift and partial effacement of the right lateral ventricle. Mass effect is somewhat atypical. No discrete mass lesion is present. Follow-up MRI in 1-2 months is recommended to assure no underlying mass lesion and expected evolution of the infarct. 4. Age advanced periventricular white matter disease reflects chronic microvascular ischemia. 5. Focal age advanced white matter changes within the left corona radiata. 6. Remote lacunar infarcts in the cerebellum bilaterally. Electronically Signed   By: San Morelle M.D.   On: 06/20/2015 17:01     Medications:   . sodium chloride 40 mL/hr at 06/21/15 2247   . sodium chloride   Intravenous Once  . sodium chloride   Intravenous Once  . acetaminophen  650 mg Oral Once  . acetaminophen  650 mg Oral Once  . calcium acetate  667 mg Oral TID WC  . carvedilol  12.5 mg Oral BID WC  . chlorproMAZINE  12.5 mg Oral TID  . [START ON 06/25/2015] darbepoetin (ARANESP) injection - DIALYSIS  200 mcg Intravenous Q Mon-HD  . digoxin  0.125 mg Oral QODAY  . diphenhydrAMINE  25 mg  Oral Once  . diphenhydrAMINE  25 mg Oral Once  . gentamicin cream  1 application Topical Daily  . heparin  40 Units/kg Dialysis Once in dialysis  . insulin aspart  0-9 Units Subcutaneous 6 times per day  . isosorbide-hydrALAZINE  1 tablet Oral TID  . lanthanum  1,000 mg Oral TID WC  . multivitamin  1 tablet Oral Daily  . pantoprazole (PROTONIX) IV  40 mg Intravenous Q12H  . pravastatin  20 mg Oral QPM  . sodium chloride  3 mL Intravenous Q12H   sodium chloride, sodium chloride, sodium chloride, acetaminophen **OR** acetaminophen, acetaminophen **OR** acetaminophen, alteplase, calcium carbonate (dosed in mg elemental calcium), camphor-menthol **AND** hydrOXYzine, chlorproMAZINE (THORAZINE) IV, docusate sodium, feeding supplement (NEPRO CARB STEADY), heparin, heparin, heparin, hydrALAZINE, HYDROmorphone (  DILAUDID) injection, lidocaine (PF), lidocaine-prilocaine, ondansetron **OR** ondansetron (ZOFRAN) IV, oxyCODONE, pentafluoroprop-tetrafluoroeth, sodium chloride, sorbitol, technetium TC 90M diethylenetriame-pentaacetic acid, zolpidem  Assessment/ Plan:   ESRD- Failed peritoneal dialysis and now transitioning to hemo   Dialysis in AM  ANEMIA- stable at present GI bleed   MBD- Vit D  HTN/VOL-controlled will challenge dry weight and taper BP meds  ACCESS- new fistula  CVA Several infarcts appreciate neurology   LOS: 6 George [name] W @TODAY @1 :57 PM

## 2015-06-23 LAB — CBC
HCT: 22.3 % — ABNORMAL LOW (ref 39.0–52.0)
HEMATOCRIT: 26.6 % — AB (ref 39.0–52.0)
HEMOGLOBIN: 7.5 g/dL — AB (ref 13.0–17.0)
HEMOGLOBIN: 8.9 g/dL — AB (ref 13.0–17.0)
MCH: 30.6 pg (ref 26.0–34.0)
MCH: 30.2 pg (ref 26.0–34.0)
MCHC: 33.6 g/dL (ref 30.0–36.0)
MCHC: 33.5 g/dL (ref 30.0–36.0)
MCV: 91 fL (ref 78.0–100.0)
MCV: 90.2 fL (ref 78.0–100.0)
PLATELETS: 159 10*3/uL (ref 150–400)
Platelets: 162 10*3/uL (ref 150–400)
RBC: 2.45 MIL/uL — ABNORMAL LOW (ref 4.22–5.81)
RBC: 2.95 MIL/uL — ABNORMAL LOW (ref 4.22–5.81)
RDW: 15.7 % — AB (ref 11.5–15.5)
RDW: 15.7 % — ABNORMAL HIGH (ref 11.5–15.5)
WBC: 13.3 10*3/uL — ABNORMAL HIGH (ref 4.0–10.5)
WBC: 12.6 10*3/uL — AB (ref 4.0–10.5)

## 2015-06-23 LAB — RENAL FUNCTION PANEL
ALBUMIN: 2.4 g/dL — AB (ref 3.5–5.0)
ANION GAP: 12 (ref 5–15)
ANION GAP: 13 (ref 5–15)
Albumin: 2.5 g/dL — ABNORMAL LOW (ref 3.5–5.0)
BUN: 92 mg/dL — AB (ref 6–20)
BUN: 52 mg/dL — ABNORMAL HIGH (ref 6–20)
CALCIUM: 8 mg/dL — AB (ref 8.9–10.3)
CALCIUM: 8.2 mg/dL — AB (ref 8.9–10.3)
CO2: 22 mmol/L (ref 22–32)
CO2: 24 mmol/L (ref 22–32)
CREATININE: 19.16 mg/dL — AB (ref 0.61–1.24)
Chloride: 102 mmol/L (ref 101–111)
Chloride: 100 mmol/L — ABNORMAL LOW (ref 101–111)
Creatinine, Ser: 12.62 mg/dL — ABNORMAL HIGH (ref 0.61–1.24)
GFR calc Af Amer: 3 mL/min — ABNORMAL LOW (ref >60)
GFR calc non Af Amer: 3 mL/min — ABNORMAL LOW (ref >60)
GFR, EST AFRICAN AMERICAN: 5 mL/min — AB (ref >60)
GFR, EST NON AFRICAN AMERICAN: 4 mL/min — AB (ref >60)
GLUCOSE: 106 mg/dL — AB (ref 65–99)
GLUCOSE: 161 mg/dL — AB (ref 65–99)
PHOSPHORUS: 8.1 mg/dL — AB (ref 2.5–4.6)
PHOSPHORUS: 5 mg/dL — AB (ref 2.5–4.6)
POTASSIUM: 3.8 mmol/L (ref 3.5–5.1)
Potassium: 5.1 mmol/L (ref 3.5–5.1)
SODIUM: 136 mmol/L (ref 135–145)
Sodium: 137 mmol/L (ref 135–145)

## 2015-06-23 LAB — GLUCOSE, CAPILLARY
GLUCOSE-CAPILLARY: 162 mg/dL — AB (ref 65–99)
GLUCOSE-CAPILLARY: 105 mg/dL — AB (ref 65–99)
GLUCOSE-CAPILLARY: 107 mg/dL — AB (ref 65–99)
Glucose-Capillary: 141 mg/dL — ABNORMAL HIGH (ref 65–99)

## 2015-06-23 MED ORDER — ALTEPLASE 2 MG IJ SOLR: 2.0000 mg | Freq: Once | INTRAMUSCULAR | Status: DC | PRN

## 2015-06-23 MED ORDER — PENTAFLUOROPROP-TETRAFLUOROETH EX AERO: 1.0000 "application " | INHALATION_SPRAY | CUTANEOUS | Status: DC | PRN

## 2015-06-23 MED ORDER — CALCITRIOL 0.25 MCG PO CAPS
0.2500 ug | ORAL_CAPSULE | ORAL | Status: DC
  Administered 2015-06-23 – 2015-06-26 (×2): 0.25 ug via ORAL
  Filled 2015-06-23 (×2): qty 1

## 2015-06-23 MED ORDER — NEPRO/CARBSTEADY PO LIQD
237.0000 mL | Freq: Two times a day (BID) | ORAL | Status: DC
  Administered 2015-06-23 (×2): 237 mL via ORAL

## 2015-06-23 MED ORDER — LIDOCAINE HCL (PF) 1 % IJ SOLN: 5.0000 mL | INTRAMUSCULAR | Status: DC | PRN

## 2015-06-23 MED ORDER — SODIUM CHLORIDE 0.9 % IV SOLN: 100.0000 mL | INTRAVENOUS | Status: DC | PRN

## 2015-06-23 MED ORDER — ASPIRIN 325 MG PO TABS
325.0000 mg | ORAL_TABLET | Freq: Every day | ORAL | Status: DC
  Administered 2015-06-23 – 2015-06-25 (×3): 325 mg via ORAL
  Filled 2015-06-23 (×3): qty 1

## 2015-06-23 MED ORDER — HEPARIN SODIUM (PORCINE) 1000 UNIT/ML DIALYSIS: 1000.0000 [IU] | INTRAMUSCULAR | Status: DC | PRN

## 2015-06-23 MED ORDER — DARBEPOETIN ALFA 200 MCG/0.4ML IJ SOSY
PREFILLED_SYRINGE | INTRAMUSCULAR | Status: AC
  Filled 2015-06-23: qty 0.4

## 2015-06-23 MED ORDER — OXYCODONE-ACETAMINOPHEN 5-325 MG PO TABS: 1.0000 | ORAL_TABLET | ORAL | Status: DC | PRN

## 2015-06-23 MED ORDER — PANTOPRAZOLE SODIUM 40 MG PO TBEC
40.0000 mg | DELAYED_RELEASE_TABLET | Freq: Two times a day (BID) | ORAL | Status: DC
  Administered 2015-06-23 – 2015-06-26 (×8): 40 mg via ORAL
  Filled 2015-06-23 (×8): qty 1

## 2015-06-23 MED ORDER — LIDOCAINE-PRILOCAINE 2.5-2.5 % EX CREA: 1.0000 "application " | TOPICAL_CREAM | CUTANEOUS | Status: DC | PRN

## 2015-06-23 MED ORDER — DARBEPOETIN ALFA 200 MCG/0.4ML IJ SOSY
200.0000 ug | PREFILLED_SYRINGE | INTRAMUSCULAR | Status: DC
  Administered 2015-06-23: 200 ug via INTRAVENOUS
  Filled 2015-06-23: qty 0.4

## 2015-06-23 NOTE — Progress Notes (Signed)
[facility] Progress Note  Assessment/Plan: 1. GIB 2/2 erosive esophagitis 2/2 NSAID use- no heparin 2. ESRD/Uremia -failed PD, catheter out Friday- transitioning to HD; plans to return to [facility] after rehab stay Cr down from 29 to 19 BUn 140s to 90s  3. Anemia - Hgb 7.5 - received 100 Aranesp 12/18 - for 200 next week; was 72% sat 12/17- redose today at 200- was getting Epogen as an outpt but doesn't know dose. 4. Secondary hyperparathyroidism - P ^ 7-8- on fosrenol 1gm and phoslo 1 ac tid iPTH 467 - start calcitriol 0.25 tiw- he was on before but doesn't know the dose 5. HTN/volume - goal 4.5 6. Nutrition - alb 2.4 - renal diet + suppl/vits 7. Disp - being evaluated for rehab  Burgess Amor [facility] Beeper [phone] 06/23/2015,8:22 AM  LOS: 7 days   Subjective:   No c/o  Objective Filed Vitals:   06/23/15 0336 06/23/15 0600 06/23/15 0800 06/23/15 0805  BP: 116/63 147/74 159/89 160/105  Pulse: 87  86 90  Temp: 98.2 F (36.8 C)  98 F (36.7 C)   TempSrc: Oral  Oral   Resp: 15 18 21 17   Height:      Weight: 90.3 kg (199 lb 1.2 oz)  90.6 kg (199 lb 11.8 oz)   SpO2:       Physical Exam General:AAM NAD on HD, still uremic fetor Heart: RRR Lungs: no rales Abdomen: soft NT- PD cath out Extremities: no edema Dialysis Access:  Left IJ  Dialysis Orders:  Additional Objective Labs: Basic Metabolic Panel:  Recent Labs Lab 06/21/15 0241 06/22/15 0327 06/23/15 0234  NA 138 137 136  K 4.9 4.6 5.1  CL 101 99* 102  CO2 22 26 22   GLUCOSE 99 92 106*  BUN 107* 71* 92*  CREATININE 20.61* 15.82* 19.16*  CALCIUM 8.3* 8.1* 8.0*  PHOS 9.6* 7.0* 8.1*   Liver Function Tests:  Recent Labs Lab 06/20/15 0307  06/21/15 0241 06/22/15 0327 06/23/15 0234  AST 34  --   --   --   --   ALT 31  --   --   --   --   ALKPHOS 57  --   --   --   --   BILITOT 0.4  --   --   --   --   PROT 6.5  --   --   --   --   ALBUMIN 2.7*  < > 2.4*  2.4* 2.4*  < > = values in this interval not displayed. No results for input(s): LIPASE, AMYLASE in the last 168 hours. CBC:  Recent Labs Lab 06/19/15 1807 06/20/15 0307  06/21/15 0241 06/22/15 0327 06/23/15 0234  WBC 13.0* 15.7*  --  13.0* 12.5* 13.3*  HGB 8.6* 9.6*  < > 8.5* 8.4* 7.5*  HCT 24.8* 29.0*  < > 25.6* 25.1* 22.3*  MCV 88.3 89.5  --  90.5 90.3 91.0  PLT 191 144*  --  163 175 159  < > = values in this interval not displayed.  Cardiac Enzymes:  Recent Labs Lab 06/16/15 1352 06/16/15 1910 06/17/15 0543 06/17/15 1211 06/17/15 2343  TROPONINI 0.10* 0.07* 0.08* 0.08* 0.10*   CBG:  Recent Labs Lab 06/22/15 1245 06/22/15 1648 06/22/15 1947 06/22/15 2337 06/23/15 0336  GLUCAP 119* 97 121* 100* 141*   Medications: . sodium chloride 40 mL/hr at 06/21/15 2247   . sodium chloride   Intravenous Once  . calcium acetate  667 mg Oral TID WC  . carvedilol  12.5 mg Oral BID WC  . chlorproMAZINE  12.5 mg Oral TID  . [START ON 06/25/2015] darbepoetin (ARANESP) injection - DIALYSIS  200 mcg Intravenous Q Mon-HD  . digoxin  0.125 mg Oral QODAY  . gentamicin cream  1 application Topical Daily  . insulin aspart  0-9 Units Subcutaneous 6 times per day  . isosorbide-hydrALAZINE  1 tablet Oral TID  . lanthanum  1,000 mg Oral TID WC  . multivitamin  1 tablet Oral Daily  . pantoprazole (PROTONIX) IV  40 mg Intravenous Q12H  . pravastatin  20 mg Oral QPM

## 2015-06-23 NOTE — Progress Notes (Signed)
[facility] PROGRESS NOTE  George [name] [id number] DOB: [birth year] DOA: 06/15/2015 PCP: No primary care provider on file.   brief interval history George [name] is a 44 y.o. male with a history of ESRD on Peritoneal dialysis, DM2, and HTN who presents to the ED with complaints of right sided chest pain and SOB. He was found to have hypoxemia in the ED to 85%. He was placed on NCO2 at 2 liters. He was referred for further workup.   Patient was admitted  for uremia secondary to under dialysis with CCPD.  Patient also noted during the hospitalization to have coffee-ground emesis was placed on a Protonix drip and transfused 2 units packed red blood cells. GI was consulted,  patient initially refused upper endoscopy. But he underwent it on 12/22 and was found to have mild erosive esophagitis. He was started on PPI. He was found to be lethargic and a ct head without contrast showed that he had a sub acute stroke. Neurology was consulted and stroke work up initiated.  We are currently waiting for records from [facility] to see if he has a h/o atrial fibrillation, to start him on anticoagulation vs aspirin 325 mg . Its been two days and we have not received any records yet, so I will empirically start him on 325 mg of aspirin daily and when have more information then change to eliquis. Discussed with Dr Leonie Man on 12/23.      Assessment/Plan: #1 uremia/under dialysis/end-stage renal disease on peritoneal dialysis Patient presented with uremia, probably from missing peritoneal dialysis. Patient denies any chest pain. Patient denies any shortness of breath.Patient on peritoneal dialysis. Patient had vein mapping and and  for access placement and tunneled dialysis catheter placement on 12/20 per vascular surgery for transition to HD. Per nephrology. Removal of PD catheter on 12/23.   #2 hematemesis/coffee-ground emesis Patient with coffee-ground emesis on 06/17/2015. Patient endorsed  recent use of ibuprofen 800 mg 4 times daily for right leg pain. Patient denies daily chronic alcohol use. Per wife patient with prior history of duodenal ulcer. He was started on PPI drip.  Patient status post 2 units packed red blood cells . Patient was seen in consultation by gastroenterology, Dr. Penelope Coop who had recommended an upper endoscopy however patient refused initially but agreed to it. Transfusion threshold hemoglobin less than 7. GI following and appreciate input and recommendations. He was scheduled for EGD on 12/21, but his CT head revealed to have a subacute CVA. His EGD was postponed to 12/22. He underwent EGD today, showed mild erosive esophagitis. Recommend continuing PPI oral and monitor hemoglobin. To transfuse when hemoglobin is less than 7.   #3 hypertension Continue Norvasc, and coreg  #4 well-controlled diabetes mellitus Patient on oral diabetic medications at home. Hemoglobin A1c = 5.4. Continue sliding scale insulin. Hold oral hypoglycemic agents. Follow.  #5 anemia/acute blood loss anemia Likely multifactorial secondary to acute blood loss anemia and anemia of chronic disease. Anemia panel with iron of 145 TIBC of 200 ferritin of 846 consistent with anemia of chronic disease likely secondary to end-stage renal disease on hemodialysis. Patient with coffee-ground emesis on 06/17/2015. He was also found to have melanotic stools on 12/20 .  Patient status post 2 units packed red blood cells.  Continue Protonix. Transfusion threshold hemoglobin less than 7. Underwent EGD showed mild erosive esophagitis. Recommend to continue the PPI.  #6 elevated troponins Patient denies any acute chest pain. Troponins trending down..If acute coronary syndrome may be secondary to  problem #1. 2-D echo with EF of 60-65% with severe LVH, grade 1 diastolic dysfunction. Severe nodular posterior annular calcification on the mitral valve. Continue coreg, , digoxin. Follow.  #7 shortness of breath Likely  secondary to hypervolemia and under hemodialysis. VQ scan negative for PE. Lower extremity Dopplers negative for DVT or SVT. Patient on CPPD per renal to be converted to HD.  #8 prophylaxis PPI for GI prophylaxis. SCDs for DVT prophylaxis.   #9 acute on chronic encephalopathy: CT head without contrast,  which revealed a sub acute right MCA stroke. Stroke work up ordered with an MRI of the brain and MRA head and neck.  MRi brain showed acute non hemorrhagic infarct involving the right frontal operculum , insular cortex, rigth lentiform nucleus and superior temporal gyrus, marked attenuation multiple M3 branch vessels. Focal mass effect with 1 to 2 mm midline shift and partial effacement of the right lateral ventricle. Mass effect is atypical. Recommend follow up MRI in 1 to 2 months, to assure no underlying mass lesion and expected evolution of the infarct. Also found remote lacunar infarcts int he cerebellum bilaterally. Carotid duplex showed no significant ICA stenosis. Echocardiogram from 12/19 showed EF of 65% with grade 1 diastolic dysfunction. Speech recommended, continued outpatient SLP eval.  Neurology consulted and recommendations given. There was a question of atrial fibrillation in the past. Discussed with the patient and RN, plan to obtain records from [facility] to start eliquis as soon as possible. Meanwhile its been two days and we have not received any, so we will start him on aspirin 325mg  empirically, and can change to anticoagulation when we receive records.    Code Status: Full Family Communication: none at bedside. Disposition Plan:  Pending CIR, transfer to telemetry.    Consultants:  Nephrology: Dr. Jonnie Finner 06/16/2015  Gastroenterology: Dr. Penelope Coop 06/17/2015  Neurology   Procedures:  V/Q scan 06/16/2015  CXR 06/16/2015  2-D echo 06/18/2015  2 units packed red blood cells 06/17/2015  MRI brain  MRA head and neck.    Antibiotics:  None  HPI/Subjective: Comfortable.no new complaints.  Objective: Filed Vitals:   06/23/15 1400 06/23/15 1600  BP: 124/75 115/72  Pulse:  85  Temp:  97.5 F (36.4 C)  Resp: 20 20    Intake/Output Summary (Last 24 hours) at 06/23/15 1829 Last data filed at 06/23/15 0500  Gross per 24 hour  Intake    920 ml  Output      0 ml  Net    920 ml   Filed Weights   06/22/15 0427 06/23/15 0336 06/23/15 0800  Weight: 87.6 kg (193 lb 2 oz) 90.3 kg (199 lb 1.2 oz) 90.6 kg (199 lb 11.8 oz)    Exam:   General:  Comfortable.   Cardiovascular: RRR  Respiratory: CTAB  Abdomen: Soft, nontender, nondistended, positive bowel sounds.  Musculoskeletal: No clubbing cyanosis or edema.  Data Reviewed: Basic Metabolic Panel:  Recent Labs Lab 06/20/15 0500 06/21/15 0241 06/22/15 0327 06/23/15 0234 06/23/15 1356  NA 140 138 137 136 137  K 4.5 4.9 4.6 5.1 3.8  CL 105 101 99* 102 100*  CO2 23 22 26 22 24   GLUCOSE 98 99 92 106* 161*  BUN 92* 107* 71* 92* 52*  CREATININE 17.38* 20.61* 15.82* 19.16* 12.62*  CALCIUM 8.4* 8.3* 8.1* 8.0* 8.2*  PHOS 7.3* 9.6* 7.0* 8.1* 5.0*   Liver Function Tests:  Recent Labs Lab 06/20/15 0307 06/20/15 0500 06/21/15 0241 06/22/15 0327 06/23/15 0234 06/23/15 1356  AST 34  --   --   --   --   --   ALT 31  --   --   --   --   --   ALKPHOS 57  --   --   --   --   --   BILITOT 0.4  --   --   --   --   --   PROT 6.5  --   --   --   --   --   ALBUMIN 2.7* 2.7* 2.4* 2.4* 2.4* 2.5*   No results for input(s): LIPASE, AMYLASE in the last 168 hours. No results for input(s): AMMONIA in the last 168 hours. CBC:  Recent Labs Lab 06/20/15 0307  06/20/15 2028 06/21/15 0241 06/22/15 0327 06/23/15 0234 06/23/15 1356  WBC 15.7*  --   --  13.0* 12.5* 13.3* 12.6*  HGB 9.6*  < > 8.7* 8.5* 8.4* 7.5* 8.9*  HCT 29.0*  < > 26.2* 25.6* 25.1* 22.3* 26.6*  MCV 89.5  --   --  90.5 90.3 91.0 90.2  PLT 144*  --   --  163 175 159 162  < > =  values in this interval not displayed. Cardiac Enzymes:  Recent Labs Lab 06/16/15 1910 06/17/15 0543 06/17/15 1211 06/17/15 2343  TROPONINI 0.07* 0.08* 0.08* 0.10*   BNP (last 3 results) No results for input(s): BNP in the last 8760 hours.  ProBNP (last 3 results) No results for input(s): PROBNP in the last 8760 hours.  CBG:  Recent Labs Lab 06/22/15 1947 06/22/15 2337 06/23/15 0336 06/23/15 1259 06/23/15 1745  GLUCAP 121* 100* 141* 162* 105*    Recent Results (from the past 240 hour(s))  MRSA PCR Screening     Status: None   Collection Time: 06/16/15  3:47 PM  Result Value Ref Range Status   MRSA by PCR NEGATIVE NEGATIVE Final    Comment:        The GeneXpert MRSA Assay (FDA approved for NASAL specimens only), is one component of a comprehensive MRSA colonization surveillance program. It is not intended to diagnose MRSA infection nor to guide or monitor treatment for MRSA infections.      Studies: No results found.  Scheduled Meds: . sodium chloride   Intravenous Once  . aspirin  325 mg Oral Daily  . calcitRIOL  0.25 mcg Oral Q T,Th,Sa-HD  . calcium acetate  667 mg Oral TID WC  . carvedilol  12.5 mg Oral BID WC  . chlorproMAZINE  12.5 mg Oral TID  . Darbepoetin Alfa      . darbepoetin (ARANESP) injection - DIALYSIS  200 mcg Intravenous Q Sat-HD  . digoxin  0.125 mg Oral QODAY  . feeding supplement (NEPRO CARB STEADY)  237 mL Oral BID BM  . gentamicin cream  1 application Topical Daily  . insulin aspart  0-9 Units Subcutaneous 6 times per day  . isosorbide-hydrALAZINE  1 tablet Oral TID  . lanthanum  1,000 mg Oral TID WC  . multivitamin  1 tablet Oral Daily  . pantoprazole  40 mg Oral BID  . pravastatin  20 mg Oral QPM   Continuous Infusions: . sodium chloride 40 mL/hr at 06/21/15 2247    Principal Problem:   Uremia Active Problems:   Hypoxia   Acute respiratory failure (HCC)   ESRD on peritoneal dialysis ([place])   Diabetes mellitus without  complication ([place])   Essential hypertension   Pleuritic chest pain   SOB (shortness  of breath)   Hematemesis   Hematemesis with nausea   ESRD (end stage renal disease) on dialysis [facility])   Right leg pain   Gastrointestinal hemorrhage with melena   Confusion   Cerebral embolism with cerebral infarction    Time spent: [place] MD [facility] Pager [phone]. If 7PM-7AM, please contact night-coverage at [email or web address], password [facility] 06/23/2015, 6:29 PM  LOS: 7 days

## 2015-06-23 NOTE — Progress Notes (Addendum)
STROKE TEAM PROGRESS NOTE   HISTORY George [name] is a 44 y.o. male with a history of ESRD 2/2 HTN, DM, HTN current smoker who presented 06/15/2015  with SOB and CP and found to be very uremic. He also endorses that he had sudden onset dizziness, and increased confusion in the middle of last week. His Fiance states that at least since Sunday, he has had a left facial droop. He has been drowsy with some dysarthria, but this was attributed to uremia. He had no difficuly with walking. His LKW was unclear, likely the middle of last week, either 12/14 or 12/15. Neurology was consulted 06/20/2015.   Patient was not administered TPA secondary to delay in arrival, unclear time of onset.   SUBJECTIVE (INTERVAL HISTORY) Overall he feels his condition is improving. No events overnight.   OBJECTIVE Temp:  [97.7 F (36.5 C)-98.6 F (37 C)] 98 F (36.7 C) (12/24 0800) Pulse Rate:  [84-105] 92 (12/24 1000) Cardiac Rhythm:  [-] Normal sinus rhythm (12/24 0750) Resp:  [12-23] 23 (12/24 1000) BP: (116-178)/(63-105) 160/105 mmHg (12/24 0930) SpO2:  [94 %-100 %] 98 % (12/23 2339) Weight:  [90.3 kg (199 lb 1.2 oz)-90.6 kg (199 lb 11.8 oz)] 90.6 kg (199 lb 11.8 oz) (12/24 0800)  CBC:   Recent Labs Lab 06/22/15 0327 06/23/15 0234  WBC 12.5* 13.3*  HGB 8.4* 7.5*  HCT 25.1* 22.3*  MCV 90.3 91.0  PLT 175 [id number]    Basic Metabolic Panel:   Recent Labs Lab 06/22/15 0327 06/23/15 0234  NA 137 136  K 4.6 5.1  CL 99* 102  CO2 26 22  GLUCOSE 92 106*  BUN 71* 92*  CREATININE 15.82* 19.16*  CALCIUM 8.1* 8.0*  PHOS 7.0* 8.1*    Lipid Panel:     Component Value Date/Time   CHOL 91 06/21/2015 0241   TRIG 107 06/21/2015 0241   HDL 17* 06/21/2015 0241   CHOLHDL 5.4 06/21/2015 0241   VLDL 21 06/21/2015 0241   LDLCALC 53 06/21/2015 0241   HgbA1c:  Lab Results  Component Value Date   HGBA1C 5.6 06/21/2015   Urine Drug Screen: No results found for: LABOPIA, COCAINSCRNUR, LABBENZ, AMPHETMU, THCU,  LABBARB    IMAGING  Ct Head Wo Contrast 06/20/2015    There is a large low-density area occupying the right frontal and temporal lobes suggestive for an infarct.  There is thrombus in the right MCA at the right sylvian fissure and a small amount of sulci effacement around the infarct. Findings are suggestive for early subacute infarct in the right MCA territory. No evidence for acute hemorrhage or hemorrhagic transformation.   MRI and MRA Brain Wo Contrast 06/20/2015    1. Acute nonhemorrhagic infarct involving the right frontal operculum, insular cortex, right lentiform nucleus, and superior temporal gyrus.  2. Marked attenuation multiple M3 branch vessels associated with the infarct.  3. Focal mass effect with 1 - 2 mm midline shift and partial effacement of the right lateral ventricle. Mass effect is somewhat atypical. No discrete mass lesion is present. Follow-up MRI in 1-2 months is recommended to assure no underlying mass lesion and expected evolution of the infarct.  4. Age advanced periventricular white matter disease reflects chronic microvascular ischemia.  5. Focal age advanced white matter changes within the left corona radiata.  6. Remote lacunar infarcts in the cerebellum bilaterally.     Dg Chest Port 1 View 06/19/2015   Tip of dialysis catheter projects in the right atrium. Negative for pneumothorax.  Improved right pleural effusion and basilar airspace disease.     2D Echocardiogram  - Left ventricle: The cavity size was normal. Wall thickness wasincreased in a pattern of severe LVH. Systolic function wasnormal. The estimated ejection fraction was in the range of 60%to 65%. Doppler parameters are consistent with abnormal leftventricular relaxation (grade 1 diastolic dysfunction). - Mitral valve: Severe nodular posterior annular calcification.Valve area by continuity equation (using LVOT flow): 2.66 cm^2. - Left atrium: The atrium was moderately dilated. - Atrial  septum: No defect or patent foramen ovale was identified.   Bilateral lower extremity venous duplex - no DVT or SVT noted in the bilateral lower extremities.     Carotid Doppler - Bilateral: No significant (1-39%) ICA stenosis. Antegrade vertebral flow.      PHYSICAL EXAM Pleasant middle-aged African-American male not in distress. . Afebrile. Head is nontraumatic. Neck is supple without bruit.    Cardiac exam no murmur or gallop. Lungs are clear to auscultation. Distal pulses are well felt. Neurological Exam : Awake alert oriented 3 normal language and mild dysarthria, soft voice. Can be understood with some difficulty due to volume. Extraocular movements are full range without nystagmus. Pupils are equal reactive. Fundi were not visualized. Visual fields seem full to bedside confrontational testing. Mild left lower facial weakness. Tongue midline. Motor system exam revealed mild left hemiplegia improved with 4+/5 strength with significant weakness of left grip, intrinsic hand muscles, left hip flexors and ankle dorsiflexors. Deep tendon reflexes are 1+ symmetric except ankle drugs are depressed. Plantars are downgoing. Sensation is preserved bilaterally. Except for diminished sensation distally in the feet.     ASSESSMENT/PLAN [name] is a 44 y.o. male with history of ESRD 2/2 HTN, DM, Afib, HTN current smoker who initially presented to [facility] with SOB and CP and found to be uremic. During hospitalization He did not receive IV t-PA due to  delay in arrival, unclear time of onset.   Stroke:  Non-dominant right frontal operculum, insular cortex, right lentiform nucleus, and superior temporal gyrus infarct embolic secondary to known atrial fibrillation    MRI  right frontal operculum, insular cortex, right lentiform nucleus, and superior temporal gyrus with atypical mass effect. small vessel disease. White matter changes L corona radiata. Old B cerebellar lacunes.  MRA  Attenuated M3  vessels associated with infarct.  Carotid Doppler  Bilateral: No significant (1-39%) ICA stenosis. Antegrade vertebral flow.   Lower extremity venous Dopplers negative  2D Echo  No source of embolus   LDL 53  HgbA1c 5.4  SCDs for VTE prophylaxis Diet renal/carb modified with fluid restriction Diet-HS Snack?: Nothing; Room service appropriate?: Yes; Fluid consistency:: Thin  aspirin 81 mg daily prior to admission, now on No antithrombotics given GIB. Await GI evaluation prior to resumption of aspirin. Consider anticoagulation. Atrial fibrillation, if/once stable  Ongoing aggressive stroke risk factor management  Follow up MRI in 1-2 mos to evaluate mass effect  Therapy recommendations:  Outpatient PT and OT  Disposition:  pending   Atrial Fibrillation  Home anticoagulation:  None CHA2DS2-VASc Score =  ?2 oral anticoagulation recommended Not an anticoagulation candidate at this time secondary to anemia/bleeding. Reconsider once GI workup completed  Hypertension  Stable  Hyperlipidemia  Home meds:  Pravachol 20, resumed in hospital  LDL 53, goal < 70  Continue statin at discharge  Diabetes  HgbA1c 5.4, at goal < 7.0  Other Stroke Risk Factors  Cigarette smoker, advised to stop smoking  THC use  Family hx  stroke (grandmother)  Other Active Problems  UREMIA/UNDER DIALYSIS/END-STAGE RENAL DISEASE ON PERITONEAL DIALYSIS  Hematemesis  Anemia/acute blood loss anemia  Elevated troponins  Shortness of breath  Acute on chronic encephalopathy  Follow-up MRI in 1-2 months is recommended to assure no underlying mass lesion and expected evolution of the infarct.    Hospital day # [place] for Pager information 06/23/2015 10:12 AM     I have personally examined this patient, reviewed notes, independently viewed imaging studies, participated in medical decision making and plan of care. I have made any  additions or clarifications directly to the above note. Agree with note above.  He presented with subacute symptoms of confusion, dizziness, dysarthria likely right MCA branch infarct of embolic etiology. He remains at risk for neurological worsening, recurrent stroke, TIA needs ongoing stroke evaluation. Patient apparently has remote history of atrial fibrillation but this needs to be verified prior to starting anticoagulation. Also needs GI input on NOAC due to recent esophagitis and GI bleed necessitating transfusion. We'll try to get his prior medical records from [place]. Discussed with patient and fianc at the bedside and answered questions.   Sarina Ill, MD Stroke Neurology [phone] [facility]  To contact Stroke Continuity provider, please refer to [email or web address] After hours, contact General Neurology Following by is

## 2015-06-24 LAB — GLUCOSE, CAPILLARY
GLUCOSE-CAPILLARY: 110 mg/dL — AB (ref 65–99)
GLUCOSE-CAPILLARY: 128 mg/dL — AB (ref 65–99)
GLUCOSE-CAPILLARY: 96 mg/dL (ref 65–99)
Glucose-Capillary: 127 mg/dL — ABNORMAL HIGH (ref 65–99)
Glucose-Capillary: 115 mg/dL — ABNORMAL HIGH (ref 65–99)
Glucose-Capillary: 105 mg/dL — ABNORMAL HIGH (ref 65–99)

## 2015-06-24 LAB — RENAL FUNCTION PANEL
ALBUMIN: 2.4 g/dL — AB (ref 3.5–5.0)
Anion gap: 14 (ref 5–15)
BUN: 65 mg/dL — ABNORMAL HIGH (ref 6–20)
CALCIUM: 8.3 mg/dL — AB (ref 8.9–10.3)
CHLORIDE: 98 mmol/L — AB (ref 101–111)
CO2: 24 mmol/L (ref 22–32)
CREATININE: 15.17 mg/dL — AB (ref 0.61–1.24)
GFR, EST AFRICAN AMERICAN: 4 mL/min — AB (ref >60)
GFR, EST NON AFRICAN AMERICAN: 3 mL/min — AB (ref >60)
Glucose, Bld: 104 mg/dL — ABNORMAL HIGH (ref 65–99)
PHOSPHORUS: 7.6 mg/dL — AB (ref 2.5–4.6)
Potassium: 4.4 mmol/L (ref 3.5–5.1)
Sodium: 136 mmol/L (ref 135–145)

## 2015-06-24 NOTE — Progress Notes (Signed)
[facility] ROUNDING NOTE   Subjective:   Interval History: no complaints this morning appears awake and alert  Objective:  Vital signs in last 24 hours:  Temp:  [97.5 F (36.4 C)-98.6 F (37 C)] 98.6 F (37 C) (12/25 1000) Pulse Rate:  [73-95] 88 (12/25 1000) Resp:  [16-20] 18 (12/25 1000) BP: (109-142)/(62-82) 142/71 mmHg (12/25 1000) SpO2:  [93 %-99 %] 96 % (12/25 1000) Weight:  [89.6 kg (197 lb 8.5 oz)] 89.6 kg (197 lb 8.5 oz) (12/24 2130)  Weight change: 0.3 kg (10.6 oz) Filed Weights   06/23/15 0336 06/23/15 0800 06/23/15 2130  Weight: 90.3 kg (199 lb 1.2 oz) 90.6 kg (199 lb 11.8 oz) 89.6 kg (197 lb 8.5 oz)    Intake/Output: I/O last 3 completed shifts: In: 1160 [P.O.:240; I.V.:920] Out: 0    Intake/Output this shift:    General:AAM NAD on HD, still uremic fetor Heart: RRR Lungs: no rales Abdomen: soft NT- PD cath out Extremities: no edema Dialysis Access: Left IJ   Basic Metabolic Panel:  Recent Labs Lab 06/21/15 0241 06/22/15 0327 06/23/15 0234 06/23/15 1356 06/24/15 0543  NA 138 137 136 137 136  K 4.9 4.6 5.1 3.8 4.4  CL 101 99* 102 100* 98*  CO2 22 26 22 24 24   GLUCOSE 99 92 106* 161* 104*  BUN 107* 71* 92* 52* 65*  CREATININE 20.61* 15.82* 19.16* 12.62* 15.17*  CALCIUM 8.3* 8.1* 8.0* 8.2* 8.3*  PHOS 9.6* 7.0* 8.1* 5.0* 7.6*    Liver Function Tests:  Recent Labs Lab 06/20/15 0307  06/21/15 0241 06/22/15 0327 06/23/15 0234 06/23/15 1356 06/24/15 0543  AST 34  --   --   --   --   --   --   ALT 31  --   --   --   --   --   --   ALKPHOS 57  --   --   --   --   --   --   BILITOT 0.4  --   --   --   --   --   --   PROT 6.5  --   --   --   --   --   --   ALBUMIN 2.7*  < > 2.4* 2.4* 2.4* 2.5* 2.4*  < > = values in this interval not displayed. No results for input(s): LIPASE, AMYLASE in the last 168 hours. No results for input(s): AMMONIA in the last 168 hours.  CBC:  Recent Labs Lab 06/20/15 0307  06/20/15 2028  06/21/15 0241 06/22/15 0327 06/23/15 0234 06/23/15 1356  WBC 15.7*  --   --  13.0* 12.5* 13.3* 12.6*  HGB 9.6*  < > 8.7* 8.5* 8.4* 7.5* 8.9*  HCT 29.0*  < > 26.2* 25.6* 25.1* 22.3* 26.6*  MCV 89.5  --   --  90.5 90.3 91.0 90.2  PLT 144*  --   --  163 175 159 162  < > = values in this interval not displayed.  Cardiac Enzymes:  Recent Labs Lab 06/17/15 1211 06/17/15 2343  TROPONINI 0.08* 0.10*    BNP: Invalid input(s): POCBNP  CBG:  Recent Labs Lab 06/23/15 1745 06/23/15 1949 06/24/15 0032 06/24/15 0455 06/24/15 0845  GLUCAP 105* 107* 128* 96 127*    Microbiology: Results for orders placed or performed during the hospital encounter of 06/15/15  MRSA PCR Screening     Status: None   Collection Time: 06/16/15  3:47 PM  Result Value  Ref Range Status   MRSA by PCR NEGATIVE NEGATIVE Final    Comment:        The GeneXpert MRSA Assay (FDA approved for NASAL specimens only), is one component of a comprehensive MRSA colonization surveillance program. It is not intended to diagnose MRSA infection nor to guide or monitor treatment for MRSA infections.     Coagulation Studies: No results for input(s): LABPROT, INR in the last 72 hours.  Urinalysis: No results for input(s): COLORURINE, LABSPEC, PHURINE, GLUCOSEU, HGBUR, BILIRUBINUR, KETONESUR, PROTEINUR, UROBILINOGEN, NITRITE, LEUKOCYTESUR in the last 72 hours.  Invalid input(s): APPERANCEUR    Imaging: No results found.   Medications:     . sodium chloride   Intravenous Once  . aspirin  325 mg Oral Daily  . calcitRIOL  0.25 mcg Oral Q T,Th,Sa-HD  . calcium acetate  667 mg Oral TID WC  . carvedilol  12.5 mg Oral BID WC  . chlorproMAZINE  12.5 mg Oral TID  . darbepoetin (ARANESP) injection - DIALYSIS  200 mcg Intravenous Q Sat-HD  . digoxin  0.125 mg Oral QODAY  . feeding supplement (NEPRO CARB STEADY)  237 mL Oral BID BM  . gentamicin cream  1 application Topical Daily  . insulin aspart  0-9 Units  Subcutaneous 6 times per day  . isosorbide-hydrALAZINE  1 tablet Oral TID  . lanthanum  1,000 mg Oral TID WC  . multivitamin  1 tablet Oral Daily  . pantoprazole  40 mg Oral BID  . pravastatin  20 mg Oral QPM   acetaminophen **OR** acetaminophen, calcium carbonate (dosed in mg elemental calcium), camphor-menthol **AND** hydrOXYzine, chlorproMAZINE (THORAZINE) IV, docusate sodium, feeding supplement (NEPRO CARB STEADY), hydrALAZINE, HYDROmorphone (DILAUDID) injection, ondansetron **OR** ondansetron (ZOFRAN) IV, oxyCODONE, oxyCODONE-acetaminophen, sorbitol, zolpidem  Assessment/ Plan:  1. GIB 2/2 erosive esophagitis 2/2 NSAID use- no heparin    2. ESRD/Uremia -failed PD, catheter out Friday- transitioning to HD; [facility]  Plan HD Tuesday 12/27 3. Anemia - Hgb 8.9    - received 100 Aranesp 12/18 - for 200 next week; was 72% sat 12/17- redosed 12/24 at 200- was getting Epogen as an outpt but doesn't know dose. 4. Secondary hyperparathyroidism - P ^ 7-6 - on fosrenol 1gm and phoslo 1 ac tid iPTH 467 - start calcitriol 0.25 tiw- he was on before but doesn't know the dose  Calcium 8.3 5. HTN/volume - goal 4.5 6. Nutrition - alb 2.4 - renal diet + suppl/vits 7. Disp - being evaluated for rehab  Patient with uremia and failed PD no on Hemo and doing well Plan HD Tuesday 12/27   LOS: 8 Jerred Zaremba W @TODAY @11 :36 AM

## 2015-06-24 NOTE — Progress Notes (Signed)
[facility] PROGRESS NOTE  George [name] [id number] DOB: [birth year] DOA: 06/15/2015 PCP: No primary care provider on file.   brief interval history George [name] is a 44 y.o. male with a history of ESRD on Peritoneal dialysis, DM2, and HTN who presents to the ED with complaints of right sided chest pain and SOB. He was found to have hypoxemia in the ED to 85%. He was placed on NCO2 at 2 liters. He was referred for further workup.   Patient was admitted  for uremia secondary to under dialysis with CCPD.  Patient also noted during the hospitalization to have coffee-ground emesis was placed on a Protonix drip and transfused 2 units packed red blood cells. GI was consulted,  patient initially refused upper endoscopy. But he underwent it on 12/22 and was found to have mild erosive esophagitis. He was started on PPI. He was found to be lethargic and a ct head without contrast showed that he had a sub acute stroke. Neurology was consulted and stroke work up initiated.  We are currently waiting for records from [facility] to see if he has a h/o atrial fibrillation, to start him on anticoagulation vs aspirin 325 mg . Its been two days and we have not received any records yet, so I will empirically start him on 325 mg of aspirin daily and when have more information then change to eliquis. Discussed with Dr Leonie Man on 12/23.   No records yet.    Assessment/Plan: #1 uremia/under dialysis/end-stage renal disease on peritoneal dialysis Patient presented with uremia, probably from missing peritoneal dialysis. Patient denies any chest pain. Patient denies any shortness of breath.Patient on peritoneal dialysis. Patient had vein mapping and and  for access placement and tunneled dialysis catheter placement on 12/20 per vascular surgery for transition to HD. Per nephrology. Removal of PD catheter on 12/23.   #2 hematemesis/coffee-ground emesis Patient with coffee-ground emesis on 06/17/2015.  Patient endorsed recent use of ibuprofen 800 mg 4 times daily for right leg pain. Patient denies daily chronic alcohol use. Per wife patient with prior history of duodenal ulcer. He was started on PPI drip.  Patient status post 2 units packed red blood cells . Patient was seen in consultation by gastroenterology, Dr. Penelope Coop who had recommended an upper endoscopy however patient refused initially but agreed to it. Transfusion threshold hemoglobin less than 7. GI following and appreciate input and recommendations. He was scheduled for EGD on 12/21, but his CT head revealed to have a subacute CVA. His EGD was postponed to 12/22. He underwent EGD today, showed mild erosive esophagitis. Recommend continuing PPI oral and monitor hemoglobin. To transfuse when hemoglobin is less than 7.   #3 hypertension Continue Norvasc, and coreg  #4 well-controlled diabetes mellitus Patient on oral diabetic medications at home. Hemoglobin A1c = 5.4. Continue sliding scale insulin. Hold oral hypoglycemic agents. Follow.  #5 anemia/acute blood loss anemia Likely multifactorial secondary to acute blood loss anemia and anemia of chronic disease. Anemia panel with iron of 145 TIBC of 200 ferritin of 846 consistent with anemia of chronic disease likely secondary to end-stage renal disease on hemodialysis. Patient with coffee-ground emesis on 06/17/2015. He was also found to have melanotic stools on 12/20 .  Patient status post 2 units packed red blood cells.  Continue Protonix. Transfusion threshold hemoglobin less than 7. Underwent EGD showed mild erosive esophagitis. Recommend to continue the PPI.  #6 elevated troponins Patient denies any acute chest pain. Troponins trending down..If acute coronary syndrome may  be secondary to problem #1. 2-D echo with EF of 60-65% with severe LVH, grade 1 diastolic dysfunction. Severe nodular posterior annular calcification on the mitral valve. Continue coreg, , digoxin. Follow.  #7 shortness  of breath Likely secondary to hypervolemia and under hemodialysis. VQ scan negative for PE. Lower extremity Dopplers negative for DVT or SVT. Patient on CPPD per renal to be converted to HD.  #8 prophylaxis PPI for GI prophylaxis. SCDs for DVT prophylaxis.   #9 acute on chronic encephalopathy: CT head without contrast,  which revealed a sub acute right MCA stroke. Stroke work up ordered with an MRI of the brain and MRA head and neck.  MRi brain showed acute non hemorrhagic infarct involving the right frontal operculum , insular cortex, rigth lentiform nucleus and superior temporal gyrus, marked attenuation multiple M3 branch vessels. Focal mass effect with 1 to 2 mm midline shift and partial effacement of the right lateral ventricle. Mass effect is atypical. Recommend follow up MRI in 1 to 2 months, to assure no underlying mass lesion and expected evolution of the infarct. Also found remote lacunar infarcts int he cerebellum bilaterally. Carotid duplex showed no significant ICA stenosis. Echocardiogram from 12/19 showed EF of 65% with grade 1 diastolic dysfunction. Speech recommended, continued outpatient SLP eval.  Neurology consulted and recommendations given. There was a question of atrial fibrillation in the past. Discussed with the patient and RN, plan to obtain records from [facility] to start eliquis as soon as possible. Meanwhile its been two days and we have not received any, so we will start him on aspirin 325mg  empirically, and can change to anticoagulation when we receive records.    Code Status: Full Family Communication: none at bedside. Disposition Plan:  Pending CIR, transfer to telemetry.    Consultants:  Nephrology: Dr. Jonnie Finner 06/16/2015  Gastroenterology: Dr. Penelope Coop 06/17/2015  Neurology   Procedures:  V/Q scan 06/16/2015  CXR 06/16/2015  2-D echo 06/18/2015  2 units packed red blood cells 06/17/2015  MRI brain  MRA head and neck.    Antibiotics:  None  HPI/Subjective: Comfortable.no new complaints.  Objective: Filed Vitals:   06/24/15 1000 06/24/15 1737  BP: 142/71 113/69  Pulse: 88 88  Temp: 98.6 F (37 C) 98.2 F (36.8 C)  Resp: 18 18    Intake/Output Summary (Last 24 hours) at 06/24/15 1844 Last data filed at 06/24/15 1700  Gross per 24 hour  Intake    960 ml  Output      0 ml  Net    960 ml   Filed Weights   06/23/15 0336 06/23/15 0800 06/23/15 2130  Weight: 90.3 kg (199 lb 1.2 oz) 90.6 kg (199 lb 11.8 oz) 89.6 kg (197 lb 8.5 oz)    Exam:   General:  Comfortable.   Cardiovascular: RRR  Respiratory: CTAB  Abdomen: Soft, nontender, nondistended, positive bowel sounds.  Musculoskeletal: No clubbing cyanosis or edema.  Data Reviewed: Basic Metabolic Panel:  Recent Labs Lab 06/21/15 0241 06/22/15 0327 06/23/15 0234 06/23/15 1356 06/24/15 0543  NA 138 137 136 137 136  K 4.9 4.6 5.1 3.8 4.4  CL 101 99* 102 100* 98*  CO2 22 26 22 24 24   GLUCOSE 99 92 106* 161* 104*  BUN 107* 71* 92* 52* 65*  CREATININE 20.61* 15.82* 19.16* 12.62* 15.17*  CALCIUM 8.3* 8.1* 8.0* 8.2* 8.3*  PHOS 9.6* 7.0* 8.1* 5.0* 7.6*   Liver Function Tests:  Recent Labs Lab 06/20/15 0307  06/21/15 0241 06/22/15 0327  06/23/15 0234 06/23/15 1356 06/24/15 0543  AST 34  --   --   --   --   --   --   ALT 31  --   --   --   --   --   --   ALKPHOS 57  --   --   --   --   --   --   BILITOT 0.4  --   --   --   --   --   --   PROT 6.5  --   --   --   --   --   --   ALBUMIN 2.7*  < > 2.4* 2.4* 2.4* 2.5* 2.4*  < > = values in this interval not displayed. No results for input(s): LIPASE, AMYLASE in the last 168 hours. No results for input(s): AMMONIA in the last 168 hours. CBC:  Recent Labs Lab 06/20/15 0307  06/20/15 2028 06/21/15 0241 06/22/15 0327 06/23/15 0234 06/23/15 1356  WBC 15.7*  --   --  13.0* 12.5* 13.3* 12.6*  HGB 9.6*  < > 8.7* 8.5* 8.4* 7.5* 8.9*  HCT 29.0*  < > 26.2* 25.6* 25.1*  22.3* 26.6*  MCV 89.5  --   --  90.5 90.3 91.0 90.2  PLT 144*  --   --  163 175 159 162  < > = values in this interval not displayed. Cardiac Enzymes:  Recent Labs Lab 06/17/15 2343  TROPONINI 0.10*   BNP (last 3 results) No results for input(s): BNP in the last 8760 hours.  ProBNP (last 3 results) No results for input(s): PROBNP in the last 8760 hours.  CBG:  Recent Labs Lab 06/24/15 0032 06/24/15 0455 06/24/15 0845 06/24/15 1210 06/24/15 1628  GLUCAP 128* 96 127* 115* 105*    Recent Results (from the past 240 hour(s))  MRSA PCR Screening     Status: None   Collection Time: 06/16/15  3:47 PM  Result Value Ref Range Status   MRSA by PCR NEGATIVE NEGATIVE Final    Comment:        The GeneXpert MRSA Assay (FDA approved for NASAL specimens only), is one component of a comprehensive MRSA colonization surveillance program. It is not intended to diagnose MRSA infection nor to guide or monitor treatment for MRSA infections.      Studies: No results found.  Scheduled Meds: . sodium chloride   Intravenous Once  . aspirin  325 mg Oral Daily  . calcitRIOL  0.25 mcg Oral Q T,Th,Sa-HD  . calcium acetate  667 mg Oral TID WC  . carvedilol  12.5 mg Oral BID WC  . chlorproMAZINE  12.5 mg Oral TID  . darbepoetin (ARANESP) injection - DIALYSIS  200 mcg Intravenous Q Sat-HD  . digoxin  0.125 mg Oral QODAY  . feeding supplement (NEPRO CARB STEADY)  237 mL Oral BID BM  . gentamicin cream  1 application Topical Daily  . insulin aspart  0-9 Units Subcutaneous 6 times per day  . isosorbide-hydrALAZINE  1 tablet Oral TID  . lanthanum  1,000 mg Oral TID WC  . multivitamin  1 tablet Oral Daily  . pantoprazole  40 mg Oral BID  . pravastatin  20 mg Oral QPM   Continuous Infusions:    Principal Problem:   Uremia Active Problems:   Hypoxia   Acute respiratory failure (HCC)   ESRD on peritoneal dialysis ([place])   Diabetes mellitus without complication ([place])   Essential  hypertension  Pleuritic chest pain   SOB (shortness of breath)   Hematemesis   Hematemesis with nausea   ESRD (end stage renal disease) on dialysis [facility])   Right leg pain   Gastrointestinal hemorrhage with melena   Confusion   Cerebral embolism with cerebral infarction    Time spent: [place] MD [facility] Pager [phone]. If 7PM-7AM, please contact night-coverage at [email or web address], password [facility] 06/24/2015, 6:44 PM  LOS: 8 days

## 2015-06-24 NOTE — Progress Notes (Addendum)
STROKE TEAM PROGRESS NOTE   HISTORY George [name] is a 44 y.o. male with a history of ESRD 2/2 HTN, DM, HTN current smoker who presented 06/15/2015  with SOB and CP and found to be very uremic. He also endorses that he had sudden onset dizziness, and increased confusion in the middle of last week. His Fiance states that at least since Sunday, he has had a left facial droop. He has been drowsy with some dysarthria, but this was attributed to uremia. He had no difficuly with walking. His LKW was unclear, likely the middle of last week, either 12/14 or 12/15. Neurology was consulted 06/20/2015.   Patient was not administered TPA secondary to delay in arrival, unclear time of onset.   SUBJECTIVE (INTERVAL HISTORY) Overall he feels his condition is improving. No events overnight.  He remains hoarse and hypophonic   OBJECTIVE Temp:  [97.5 F (36.4 C)-98.3 F (36.8 C)] 98.3 F (36.8 C) (12/25 0500) Pulse Rate:  [73-105] 81 (12/25 0500) Cardiac Rhythm:  [-] Normal sinus rhythm (12/24 2138) Resp:  [13-34] 18 (12/25 0500) BP: (109-178)/(62-105) 119/71 mmHg (12/25 0500) SpO2:  [93 %-99 %] 93 % (12/25 0500) Weight:  [89.6 kg (197 lb 8.5 oz)] 89.6 kg (197 lb 8.5 oz) (12/24 2130)  CBC:   Recent Labs Lab 06/23/15 0234 06/23/15 1356  WBC 13.3* 12.6*  HGB 7.5* 8.9*  HCT 22.3* 26.6*  MCV 91.0 90.2  PLT 159 0000000    Basic Metabolic Panel:   Recent Labs Lab 06/23/15 1356 06/24/15 0543  NA 137 136  K 3.8 4.4  CL 100* 98*  CO2 24 24  GLUCOSE 161* 104*  BUN 52* 65*  CREATININE 12.62* 15.17*  CALCIUM 8.2* 8.3*  PHOS 5.0* 7.6*    Lipid Panel:     Component Value Date/Time   CHOL 91 06/21/2015 0241   TRIG 107 06/21/2015 0241   HDL 17* 06/21/2015 0241   CHOLHDL 5.4 06/21/2015 0241   VLDL 21 06/21/2015 0241   LDLCALC 53 06/21/2015 0241   HgbA1c:  Lab Results  Component Value Date   HGBA1C 5.6 06/21/2015   Urine Drug Screen: No results found for: LABOPIA, COCAINSCRNUR, LABBENZ,  AMPHETMU, THCU, LABBARB    IMAGING  Ct Head Wo Contrast 06/20/2015    There is a large low-density area occupying the right frontal and temporal lobes suggestive for an infarct.  There is thrombus in the right MCA at the right sylvian fissure and a small amount of sulci effacement around the infarct. Findings are suggestive for early subacute infarct in the right MCA territory. No evidence for acute hemorrhage or hemorrhagic transformation.   MRI and MRA Brain Wo Contrast 06/20/2015    1. Acute nonhemorrhagic infarct involving the right frontal operculum, insular cortex, right lentiform nucleus, and superior temporal gyrus.  2. Marked attenuation multiple M3 branch vessels associated with the infarct.  3. Focal mass effect with 1 - 2 mm midline shift and partial effacement of the right lateral ventricle. Mass effect is somewhat atypical. No discrete mass lesion is present. Follow-up MRI in 1-2 months is recommended to assure no underlying mass lesion and expected evolution of the infarct.  4. Age advanced periventricular white matter disease reflects chronic microvascular ischemia.  5. Focal age advanced white matter changes within the left corona radiata.  6. Remote lacunar infarcts in the cerebellum bilaterally.     Dg Chest Port 1 View 06/19/2015   Tip of dialysis catheter projects in the right atrium. Negative for  pneumothorax. Improved right pleural effusion and basilar airspace disease.    2D Echocardiogram  - Left ventricle: The cavity size was normal. Wall thickness wasincreased in a pattern of severe LVH. Systolic function wasnormal. The estimated ejection fraction was in the range of 60%to 65%. Doppler parameters are consistent with abnormal leftventricular relaxation (grade 1 diastolic dysfunction). - Mitral valve: Severe nodular posterior annular calcification.Valve area by continuity equation (using LVOT flow): 2.66 cm^2. - Left atrium: The atrium was moderately  dilated. - Atrial septum: No defect or patent foramen ovale was identified.   Bilateral lower extremity venous duplex - no DVT or SVT noted in the bilateral lower extremities.     Carotid Doppler - Bilateral: No significant (1-39%) ICA stenosis. Antegrade vertebral flow.   PHYSICAL EXAM Pleasant, cooperative and in no apparent distress. Marland Kitchen  HEENT:  Head is nontraumatic. Neck is supple without bruit.     Cardiac:  o murmur or gallop.  Lungs are clear to auscultation.  Extrem:  No C/C; Distal pulses are well felt.  Neurological Exam : Awake alert oriented 3 normal language and mild dysarthria, hypophonic Can be understood with some difficulty due to volume.   Cranial nerves PERRL Extraocular movements are full range without nystagmus.  Visual fields seem full  Mild left lower facial weakness.  Tongue midline.   Motor system exam revealed mild left hemiplegia improved with 4+/5 strength with significant weakness of left grip, intrinsic hand muscles, left hip flexors and ankle dorsiflexors.   Sensation is preserved bilaterally.   Gait deferred  ASSESSMENT/PLAN Mr. George [name] is a 44 y.o. male with history of ESRD 2/2 HTN, DM, Afib, HTN current smoker who initially presented to [facility] with SOB and CP and found to be uremic. During hospitalization He did not receive IV t-PA due to  delay in arrival, unclear time of onset.   Stroke:  Non-dominant right frontal operculum, insular cortex, right lentiform nucleus, and superior temporal gyrus infarct embolic secondary to known atrial fibrillation    MRI  right frontal operculum, insular cortex, right lentiform nucleus, and superior temporal gyrus with atypical mass effect. small vessel disease. White matter changes L corona radiata. Old B cerebellar lacunes.  MRA  Attenuated M3 vessels associated with infarct.  Carotid Doppler  Bilateral: No significant (1-39%) ICA stenosis. Antegrade vertebral flow.   Lower extremity venous Dopplers  negative  2D Echo  No source of embolus   LDL 53  HgbA1c 5.4  SCDs for VTE prophylaxis Diet renal/carb modified with fluid restriction Diet-HS Snack?: Nothing; Room service appropriate?: Yes; Fluid consistency:: Thin  aspirin 81 mg daily prior to admission, now on No antithrombotics given GIB. Await GI evaluation prior to resumption of aspirin. Consider anticoagulation for Atrial fibrillation, if/once stable  Ongoing aggressive stroke risk factor management  Follow up MRI in 1-2 mos to evaluate mass effect  Therapy recommendations:  CIR recommended. MD consult pending.  Disposition:  pending   Atrial Fibrillation  Home anticoagulation:  None CHA2DS2-VASc Score =  ?2 oral anticoagulation recommended Not an anticoagulation candidate at this time secondary to anemia/bleeding. Reconsider once GI workup completed  Hypertension  Stable  Hyperlipidemia  Home meds:  Pravachol 20, resumed in hospital  LDL 53, goal < 70  Continue statin at discharge  Diabetes  HgbA1c 5.4, at goal < 7.0  Other Stroke Risk Factors  Cigarette smoker, advised to stop smoking  THC use  Family hx stroke (grandmother)  Other Active Problems  UREMIA/UNDER DIALYSIS/END-STAGE RENAL DISEASE ON PERITONEAL  DIALYSIS  Hematemesis  Anemia/acute blood loss anemia  Elevated troponins  Shortness of breath  Acute on chronic encephalopathy  Hospital day # [place] for Pager information 06/24/2015 8:04 AM     I have personally examined this patient, reviewed notes, independently viewed imaging studies, participated in medical decision making and plan of care. I have made any additions or clarifications directly to the above note. Agree with note above.  He presented with subacute symptoms of confusion, dizziness, dysarthria likely right MCA branch infarct of embolic etiology. He remains at risk for neurological worsening, recurrent stroke, TIA  needs ongoing stroke evaluation. Patient apparently has remote history of atrial fibrillation but this needs to be verified prior to starting anticoagulation. Also needs GI input on NOAC due to recent esophagitis and GI bleed necessitating transfusion. Records from [place] will be important.   Signed:  Dr. Elissa Hefty   To contact Stroke Continuity provider, please refer to [email or web address] After hours, contact General Neurology Following by is

## 2015-06-25 DIAGNOSIS — Z992 Dependence on renal dialysis: Secondary | ICD-10-CM

## 2015-06-25 DIAGNOSIS — E118 Type 2 diabetes mellitus with unspecified complications: Secondary | ICD-10-CM | POA: Insufficient documentation

## 2015-06-25 DIAGNOSIS — E119 Type 2 diabetes mellitus without complications: Secondary | ICD-10-CM

## 2015-06-25 DIAGNOSIS — D649 Anemia, unspecified: Secondary | ICD-10-CM | POA: Insufficient documentation

## 2015-06-25 DIAGNOSIS — E785 Hyperlipidemia, unspecified: Secondary | ICD-10-CM

## 2015-06-25 DIAGNOSIS — Z720 Tobacco use: Secondary | ICD-10-CM

## 2015-06-25 DIAGNOSIS — I10 Essential (primary) hypertension: Secondary | ICD-10-CM

## 2015-06-25 DIAGNOSIS — I63411 Cerebral infarction due to embolism of right middle cerebral artery: Secondary | ICD-10-CM | POA: Insufficient documentation

## 2015-06-25 DIAGNOSIS — I69354 Hemiplegia and hemiparesis following cerebral infarction affecting left non-dominant side: Secondary | ICD-10-CM

## 2015-06-25 DIAGNOSIS — N186 End stage renal disease: Secondary | ICD-10-CM

## 2015-06-25 DIAGNOSIS — N19 Unspecified kidney failure: Secondary | ICD-10-CM

## 2015-06-25 LAB — RENAL FUNCTION PANEL
ALBUMIN: 2.5 g/dL — AB (ref 3.5–5.0)
Anion gap: 16 — ABNORMAL HIGH (ref 5–15)
BUN: 80 mg/dL — AB (ref 6–20)
CALCIUM: 8.3 mg/dL — AB (ref 8.9–10.3)
CO2: 22 mmol/L (ref 22–32)
Chloride: 98 mmol/L — ABNORMAL LOW (ref 101–111)
Creatinine, Ser: 18.04 mg/dL — ABNORMAL HIGH (ref 0.61–1.24)
GFR calc Af Amer: 3 mL/min — ABNORMAL LOW (ref >60)
GFR calc non Af Amer: 3 mL/min — ABNORMAL LOW (ref >60)
GLUCOSE: 97 mg/dL (ref 65–99)
PHOSPHORUS: 8.6 mg/dL — AB (ref 2.5–4.6)
Potassium: 4.8 mmol/L (ref 3.5–5.1)
SODIUM: 136 mmol/L (ref 135–145)

## 2015-06-25 LAB — GLUCOSE, CAPILLARY
GLUCOSE-CAPILLARY: 86 mg/dL (ref 65–99)
GLUCOSE-CAPILLARY: 125 mg/dL — AB (ref 65–99)
Glucose-Capillary: 102 mg/dL — ABNORMAL HIGH (ref 65–99)
Glucose-Capillary: 115 mg/dL — ABNORMAL HIGH (ref 65–99)
Glucose-Capillary: 143 mg/dL — ABNORMAL HIGH (ref 65–99)
Glucose-Capillary: 97 mg/dL (ref 65–99)

## 2015-06-25 MED ORDER — RENA-VITE PO TABS
1.0000 | ORAL_TABLET | Freq: Every day | ORAL | Status: DC
  Administered 2015-06-25 – 2015-06-26 (×2): 1 via ORAL
  Filled 2015-06-25 (×3): qty 1

## 2015-06-25 NOTE — Progress Notes (Signed)
[facility] PROGRESS NOTE  George [name] [id number] DOB: 07-Jun-1971 DOA: 06/15/2015 PCP: No primary care provider on file.   brief interval history George [name] is a 44 y.o. male with a history of ESRD on Peritoneal dialysis, DM2, and HTN who presents to the ED with complaints of right sided chest pain and SOB. He was found to have hypoxemia in the ED to 85%. He was placed on NCO2 at 2 liters. He was referred for further workup.   Patient was admitted  for uremia secondary to under dialysis with CCPD.  Patient also noted during the hospitalization to have coffee-ground emesis was placed on a Protonix drip and transfused 2 units packed red blood cells. GI was consulted,  patient initially refused upper endoscopy. But he underwent it on 12/22 and was found to have mild erosive esophagitis. He was started on PPI. He was found to be lethargic and a ct head without contrast showed that he had a sub acute stroke. Neurology was consulted and stroke work up initiated.  We are currently waiting for records from [facility] to see if he has a h/o atrial fibrillation, to start him on anticoagulation vs aspirin 325 mg . Its been two days and we have not received any records yet, so I will empirically start him on 325 mg of aspirin daily and when have more information then change to eliquis. Discussed with Dr Leonie Man on 12/23.   No records yet as of 12/26.    Assessment/Plan: #1 uremia/under dialysis/end-stage renal disease on peritoneal dialysis Patient presented with uremia, probably from missing peritoneal dialysis. Patient denies any chest pain. Patient denies any shortness of breath.Patient on peritoneal dialysis. Patient had vein mapping and and  for access placement and tunneled dialysis catheter placement on 12/20 per vascular surgery for transition to HD. Per nephrology. Removal of PD catheter on 12/23.   #2 hematemesis/coffee-ground emesis Patient with coffee-ground emesis on  06/17/2015. Patient endorsed recent use of ibuprofen 800 mg 4 times daily for right leg pain. Patient denies daily chronic alcohol use. Per wife patient with prior history of duodenal ulcer. He was started on PPI drip.  Patient status post 2 units packed red blood cells . Patient was seen in consultation by gastroenterology, Dr. Penelope Coop who had recommended an upper endoscopy however patient refused initially but agreed to it. Transfusion threshold hemoglobin less than 7. GI following and appreciate input and recommendations. He was scheduled for EGD on 12/21, but his CT head revealed to have a subacute CVA. His EGD was postponed to 12/22. He underwent EGD today, showed mild erosive esophagitis. Recommend continuing PPI oral and monitor hemoglobin. To transfuse when hemoglobin is less than 7.   #3 hypertension Continue Norvasc, and coreg Better controlled.   #4 well-controlled diabetes mellitus Patient on oral diabetic medications at home. Hemoglobin A1c = 5.4. Continue sliding scale insulin. Hold oral hypoglycemic agents. Follow.  #5 anemia/acute blood loss anemia Likely multifactorial secondary to acute blood loss anemia and anemia of chronic disease. Anemia panel with iron of 145 TIBC of 200 ferritin of 846 consistent with anemia of chronic disease likely secondary to end-stage renal disease on hemodialysis. Patient with coffee-ground emesis on 06/17/2015. He was also found to have melanotic stools on 12/20 .  Patient status post 2 units packed red blood cells.  Continue Protonix. Transfusion threshold hemoglobin less than 7. Underwent EGD showed mild erosive esophagitis. Recommend to continue the PPI.  #6 elevated troponins Patient denies any acute chest pain. Troponins  trending down..If acute coronary syndrome may be secondary to problem #1. 2-D echo with EF of 60-65% with severe LVH, grade 1 diastolic dysfunction. Severe nodular posterior annular calcification on the mitral valve. Continue coreg, ,  digoxin. Follow.  #7 shortness of breath Likely secondary to hypervolemia and under hemodialysis. VQ scan negative for PE. Lower extremity Dopplers negative for DVT or SVT. Patient on CPPD per renal to be converted to HD. Resolved.  #8 prophylaxis PPI for GI prophylaxis. SCDs for DVT prophylaxis.   #9 acute on chronic encephalopathy: CT head without contrast,  which revealed a sub acute right MCA stroke. Stroke work up ordered with an MRI of the brain and MRA head and neck.  MRi brain showed acute non hemorrhagic infarct involving the right frontal operculum , insular cortex, rigth lentiform nucleus and superior temporal gyrus, marked attenuation multiple M3 branch vessels. Focal mass effect with 1 to 2 mm midline shift and partial effacement of the right lateral ventricle. Mass effect is atypical. Recommend follow up MRI in 1 to 2 months, to assure no underlying mass lesion and expected evolution of the infarct. Also found remote lacunar infarcts int he cerebellum bilaterally. Carotid duplex showed no significant ICA stenosis. Echocardiogram from 12/19 showed EF of 65% with grade 1 diastolic dysfunction. Speech recommended, continued outpatient SLP eval.  Neurology consulted and recommendations given. There was a question of atrial fibrillation in the past. Discussed with the patient and RN, plan to obtain records from [facility] to start eliquis as soon as possible. Meanwhile its been two days and we have not received any, so we will start him on aspirin 325mg  empirically, and can change to anticoagulation when we receive records.    Code Status: Full Family Communication: none at bedside. Disposition Plan:  Pending CIR eval.    Consultants:  Nephrology: Dr. Jonnie Finner 06/16/2015  Gastroenterology: Dr. Penelope Coop 06/17/2015  Neurology   Procedures:  V/Q scan 06/16/2015  CXR 06/16/2015  2-D echo 06/18/2015  2 units packed red blood cells 06/17/2015  MRI brain  MRA head and neck.    Antibiotics:  None  HPI/Subjective: Comfortable.no new complaints.  Objective: Filed Vitals:   06/25/15 0755 06/25/15 1611  BP: 151/78 120/56  Pulse: 80 96  Temp: 98 F (36.7 C) 98.6 F (37 C)  Resp: 18 18    Intake/Output Summary (Last 24 hours) at 06/25/15 1813 Last data filed at 06/25/15 0830  Gross per 24 hour  Intake    700 ml  Output      0 ml  Net    700 ml   Filed Weights   06/23/15 0800 06/23/15 2130 06/24/15 2050  Weight: 90.6 kg (199 lb 11.8 oz) 89.6 kg (197 lb 8.5 oz) 90.2 kg (198 lb 13.7 oz)    Exam:   General:  Comfortable.   Cardiovascular: RRR  Respiratory: CTAB  Abdomen: Soft, nontender, nondistended, positive bowel sounds.  Musculoskeletal: No clubbing cyanosis or edema.  Data Reviewed: Basic Metabolic Panel:  Recent Labs Lab 06/22/15 0327 06/23/15 0234 06/23/15 1356 06/24/15 0543 06/25/15 0707  NA 137 136 137 136 136  K 4.6 5.1 3.8 4.4 4.8  CL 99* 102 100* 98* 98*  CO2 26 22 24 24 22   GLUCOSE 92 106* 161* 104* 97  BUN 71* 92* 52* 65* 80*  CREATININE 15.82* 19.16* 12.62* 15.17* 18.04*  CALCIUM 8.1* 8.0* 8.2* 8.3* 8.3*  PHOS 7.0* 8.1* 5.0* 7.6* 8.6*   Liver Function Tests:  Recent Labs Lab 06/20/15 [phone]  06/22/15 0327 06/23/15 0234 06/23/15 1356 06/24/15 0543 06/25/15 0707  AST 34  --   --   --   --   --   --   ALT 31  --   --   --   --   --   --   ALKPHOS 57  --   --   --   --   --   --   BILITOT 0.4  --   --   --   --   --   --   PROT 6.5  --   --   --   --   --   --   ALBUMIN 2.7*  < > 2.4* 2.4* 2.5* 2.4* 2.5*  < > = values in this interval not displayed. No results for input(s): LIPASE, AMYLASE in the last 168 hours. No results for input(s): AMMONIA in the last 168 hours. CBC:  Recent Labs Lab 06/20/15 0307  06/20/15 2028 06/21/15 0241 06/22/15 0327 06/23/15 0234 06/23/15 1356  WBC 15.7*  --   --  13.0* 12.5* 13.3* 12.6*  HGB 9.6*  < > 8.7* 8.5* 8.4* 7.5* 8.9*  HCT 29.0*  < > 26.2* 25.6* 25.1* 22.3*  26.6*  MCV 89.5  --   --  90.5 90.3 91.0 90.2  PLT 144*  --   --  163 175 159 162  < > = values in this interval not displayed. Cardiac Enzymes: No results for input(s): CKTOTAL, CKMB, CKMBINDEX, TROPONINI in the last 168 hours. BNP (last 3 results) No results for input(s): BNP in the last 8760 hours.  ProBNP (last 3 results) No results for input(s): PROBNP in the last 8760 hours.  CBG:  Recent Labs Lab 06/25/15 0020 06/25/15 0417 06/25/15 0753 06/25/15 1157 06/25/15 1608  GLUCAP 143* 97 86 115* 102*    Recent Results (from the past 240 hour(s))  MRSA PCR Screening     Status: None   Collection Time: 06/16/15  3:47 PM  Result Value Ref Range Status   MRSA by PCR NEGATIVE NEGATIVE Final    Comment:        The GeneXpert MRSA Assay (FDA approved for NASAL specimens only), is one component of a comprehensive MRSA colonization surveillance program. It is not intended to diagnose MRSA infection nor to guide or monitor treatment for MRSA infections.      Studies: No results found.  Scheduled Meds: . sodium chloride   Intravenous Once  . aspirin  325 mg Oral Daily  . calcitRIOL  0.25 mcg Oral Q T,Th,Sa-HD  . calcium acetate  667 mg Oral TID WC  . carvedilol  12.5 mg Oral BID WC  . chlorproMAZINE  12.5 mg Oral TID  . darbepoetin (ARANESP) injection - DIALYSIS  200 mcg Intravenous Q Sat-HD  . digoxin  0.125 mg Oral QODAY  . feeding supplement (NEPRO CARB STEADY)  237 mL Oral BID BM  . gentamicin cream  1 application Topical Daily  . insulin aspart  0-9 Units Subcutaneous 6 times per day  . isosorbide-hydrALAZINE  1 tablet Oral TID  . lanthanum  1,000 mg Oral TID WC  . multivitamin  1 tablet Oral QHS  . pantoprazole  40 mg Oral BID  . pravastatin  20 mg Oral QPM   Continuous Infusions:    Principal Problem:   Uremia Active Problems:   Hypoxia   Acute respiratory failure (HCC)   ESRD on peritoneal dialysis ([place])   Diabetes mellitus without complication  (  [place])   Essential hypertension   Pleuritic chest pain   SOB (shortness of breath)   Hematemesis   Hematemesis with nausea   ESRD (end stage renal disease) on dialysis (HCC)   Right leg pain   Gastrointestinal hemorrhage with melena   Confusion   Cerebral embolism with cerebral infarction   ESRD (end stage renal disease) (HCC)   Type 2 diabetes mellitus with complication (HCC)   Absolute anemia   Tobacco abuse   Hemiparesis affecting left side as late effect of stroke ([place])    Time spent: [place] MD [facility] Pager [phone]. If 7PM-7AM, please contact night-coverage at [email or web address], password [facility] 06/25/2015, 6:13 PM  LOS: 9 days

## 2015-06-25 NOTE — Consult Note (Signed)
Physical Medicine and Rehabilitation Consult Reason for Consult: Acute nonhemorrhagic infarct right frontal operculum, insular cortex, right lenticular nuclear since superior temporal gyrus. Referring Physician: [facility]   HPI: George [name] is a 44 y.o. right handed male with history of end-stage renal disease on peritoneal dialysis and noncompliant, diabetes mellitus and hypertension. Currently Lives with sister and mother and 24 hour care. One level home for steps to entry independent prior to admission. Presented 06/15/2015 with right-sided chest pain and shortness of breath. Found to be hypoxic 85% placed on 2 L of oxygen. Chest x-ray with chronic scarring and two small right pleural effusions.. Perfusion lung scan low probability of pulmonary emboli. Troponin 0.10. Echocardiogram with ejection fraction [id number] grade 1 diastolic dysfunction. Developed vomiting and hematocrit emesis with gastroenterology consulted. Hemoglobin decreased from 7.8-6.7 patient was transfused. Patient refused endoscopy and later excepting of procedure however patient developed left facial droop and weakness with dizziness and altered mental status. Neurology consulted 06/20/2015. MRI showed acute nonhemorrhagic infarct right frontal operculum, insular cortex, right lenticular nucleus and superior temporal gyrus. Remote lacunar infarcts in the cerebellum bilaterally. Patient did not receive TPA. Carotid Dopplers did not show ICA stenosis. Placed on aspirin for CVA prophylaxis. Nephrology follow-up for failed peritoneal dialysis and transitioning to hemodialysis with new fistula placed. Physical therapy evaluation completed 06/21/2015 with recommendations of physical medicine rehabilitation consult.   Review of Systems  Constitutional: Negative for fever and chills.  HENT: Negative for hearing loss.   Eyes: Negative for blurred vision and double vision.  Respiratory: Positive for cough.        Shortness of breath with  exertion  Cardiovascular: Negative for chest pain and palpitations.  Gastrointestinal: Positive for melena. Negative for heartburn and nausea.  Musculoskeletal: Positive for myalgias.  Skin: Negative for rash.  Neurological: Positive for focal weakness and weakness. Negative for headaches.  All other systems reviewed and are negative.  Past Medical History  Diagnosis Date  . Diabetes mellitus without complication ([place])   . Hypertension   . Renal disorder    Past Surgical History  Procedure Laterality Date  . Peritoneal dialysis catheter placed      around 2015 in [place]  . Insertion of dialysis catheter Left 06/19/2015    Procedure: INSERTION OF DIALYSIS CATHETER LEFT INTERNAL JUGULAR;  Surgeon: Elam Dutch, MD;  Location: [facility];  Service: Vascular;  Laterality: Left;  . Av fistula placement Left 06/19/2015    Procedure: LEFT ARTERIOVENOUS (AV) FISTULA CREATION;  Surgeon: Elam Dutch, MD;  Location: [place];  Service: Vascular;  Laterality: Left;  . Esophagogastroduodenoscopy N/A 06/21/2015    Procedure: ESOPHAGOGASTRODUODENOSCOPY (EGD);  Surgeon: Laurence Spates, MD;  Location: [facility] ENDOSCOPY;  Service: Endoscopy;  Laterality: N/A;   History reviewed. No pertinent family history. Social History:  reports that he has been smoking Cigarettes.  He does not have any smokeless tobacco history on file. He reports that he uses illicit drugs (Marijuana). He reports that he does not drink alcohol. Allergies:  Allergies  Allergen Reactions  . Lisinopril Shortness Of Breath    Pt's family said he had SOB, Chest pressure , and coughing    Medications Prior to Admission  Medication Sig Dispense Refill  . amLODipine (NORVASC) 5 MG tablet Take 5 mg by mouth 2 (two) times daily.    Marland Kitchen aspirin EC 81 MG tablet Take 81 mg by mouth daily.    . carvedilol (COREG) 25 MG tablet Take 25 mg by mouth 2 (  two) times daily with a meal.    . digoxin (LANOXIN) 0.125 MG tablet Take 0.125 mg by mouth  every other day.    . furosemide (LASIX) 80 MG tablet Take 160 mg by mouth 2 (two) times daily.    Marland Kitchen ibuprofen (ADVIL,MOTRIN) 800 MG tablet Take 1 tablet (800 mg total) by mouth 3 (three) times daily. 21 tablet 0  . isosorbide-hydrALAZINE (BIDIL) 20-37.5 MG tablet Take 1 tablet by mouth 3 (three) times daily.    Marland Kitchen lanthanum (FOSRENOL) 1000 MG chewable tablet Chew 1,000 mg by mouth 3 (three) times daily with meals.    Marland Kitchen losartan (COZAAR) 25 MG tablet Take 25 mg by mouth daily.    . multivitamin (RENA-VIT) TABS tablet Take 1 tablet by mouth daily.    . pravastatin (PRAVACHOL) 20 MG tablet Take 20 mg by mouth every evening.    . sitaGLIPtin (JANUVIA) 25 MG tablet Take 25 mg by mouth daily.    . cyclobenzaprine (FLEXERIL) 10 MG tablet Take 1 tablet (10 mg total) by mouth 2 (two) times daily as needed for muscle spasms. 20 tablet 0    Home: Home Living Family/patient expects to be discharged to:: Private residence Living Arrangements: Spouse/significant other, Other relatives Available Help at Discharge: Family, Available 24 hours/day Type of Home: House Home Access: Stairs to enter [employer] of Steps: 4 Entrance Stairs-Rails: Right, Left, Can reach both Home Layout: One level Bathroom Shower/Tub: Tub/shower unit Home Equipment: None  Lives With: Family, Significant other  Functional History: Prior Function Level of Independence: Independent Comments: Lives usually in [place] with fiance', but has been in [place] since their temporary separation and staying with sister; fiance' reports plans to return to [place] when stable Functional Status:  Mobility: Bed Mobility Overal bed mobility: Needs Assistance Bed Mobility: Supine to Sit, Sit to Supine Supine to sit: Supervision Sit to supine: Supervision General bed mobility comments: Pt impulsive with sit to supine.  Pt was tired.  Poor awareness overall.  Transfers Overall transfer level: Needs assistance Equipment used:  None Transfers: Sit to/from Stand Sit to Stand: Min assist Stand pivot transfers: Min assist, +2 safety/equipment General transfer comment: Demonstrates strength to rise from bed  with assist for steadying pt; pt with need for steadying assist due to left inattention.  Pt running into objects on left unless cued.   Ambulation/Gait Ambulation/Gait assistance: Min assist, Mod assist Ambulation Distance (Feet): 150 Feet Assistive device: None Gait Pattern/deviations: Step-through pattern, Decreased stride length, Decreased weight shift to left, Staggering left, Staggering right General Gait Details: Pt was able to ambulate without device however was losing balance at times needing mod assist and cues due to left inattention and left LE weakness.   Stairs: Yes Stairs assistance: Mod assist Stair Management: Step to pattern, One rail Right, Forwards Number of Stairs: 4 General stair comments: Pt impulsively began to go up steps before PT could cue pt and pt needed mod assist to recover as he was going up right his left foot first and was catching it on steps as he went.  Once pt slowed down, PT cued pt correctly and he was able to go up and down steps using the correct feet and needed min guard.      ADL: ADL Overall ADL's : Needs assistance/impaired Eating/Feeding: Set up, Sitting Grooming: Minimal assistance, Sitting Upper Body Bathing: Set up, Supervision/ safety, Sitting Lower Body Bathing: Minimal assistance, Sit to/from stand Upper Body Dressing : Minimal assistance, Sitting Lower Body Dressing: Minimal  assistance, Sit to/from stand Toilet Transfer: Minimal assistance, Stand-pivot (bed>recliner) Toileting- Clothing Manipulation and Hygiene: Minimal assistance, Sit to/from stand  Cognition: Cognition Overall Cognitive Status:  (slower to respond today at times, may be due to procedure this AM and still drowsy. Also some difficulty following 1 step commands (needing more than 1 cue at  time)) Arousal/Alertness: Awake/alert Orientation Level: Oriented X4 Attention: Alternating, Divided Alternating Attention: Impaired Alternating Attention Impairment: Functional basic, Verbal basic Divided Attention: Impaired Divided Attention Impairment: Functional basic, Verbal basic Memory: Impaired Memory Impairment: Other (comment) (working memory) Awareness: Impaired Awareness Impairment: Emergent impairment, Anticipatory impairment Problem Solving: Impaired Problem Solving Impairment: Functional complex Behaviors: Impulsive Safety/Judgment: Appears intact Cognition Arousal/Alertness: Awake/alert Behavior During Therapy: WFL for tasks assessed/performed Overall Cognitive Status:  (slower to respond today at times, may be due to procedure this AM and still drowsy. Also some difficulty following 1 step commands (needing more than 1 cue at time))  Blood pressure 113/56, pulse 77, temperature 98.7 F (37.1 C), temperature source Oral, resp. rate 18, height 6' (1.829 m), weight 90.2 kg (198 lb 13.7 oz), SpO2 98 %. Physical Exam  Vitals reviewed. Constitutional: He is oriented to person, place, and time. He appears well-developed and well-nourished.  HENT:  Head: Normocephalic and atraumatic.  Mild facial droop  Eyes: Conjunctivae and EOM are normal.  Neck: Normal range of motion. Neck supple. No thyromegaly present.  Cardiovascular: Normal rate and regular rhythm.   Respiratory: Effort normal and breath sounds normal. No respiratory distress.  GI: Soft. Bowel sounds are normal. He exhibits no distension.  Musculoskeletal: He exhibits no edema or tenderness.  PROM WNL  Neurological: He is alert and oriented to person, place, and time. He has normal reflexes.  Makes good eye contact with examiner.  Speech is of low tone.  Follows simple commands.  Limited awareness of deficits.  Sensation light touch Motor: LUE 4+/5 proximal distal RU5/5 distal B/LLE 4+-5/5 proximal distal    Skin: Skin is warm and dry.  Psychiatric: He has a normal mood and affect. His behavior is normal.    Results for orders placed or performed during the hospital encounter of 06/15/15 (from the past 24 hour(s))  Glucose, capillary     Status: Abnormal   Collection Time: 06/24/15  8:45 AM  Result Value Ref Range   Glucose-Capillary 127 (H) 65 - 99 mg/dL  Glucose, capillary     Status: Abnormal   Collection Time: 06/24/15 12:10 PM  Result Value Ref Range   Glucose-Capillary 115 (H) 65 - 99 mg/dL  Glucose, capillary     Status: Abnormal   Collection Time: 06/24/15  4:28 PM  Result Value Ref Range   Glucose-Capillary 105 (H) 65 - 99 mg/dL  Glucose, capillary     Status: Abnormal   Collection Time: 06/24/15  8:49 PM  Result Value Ref Range   Glucose-Capillary 110 (H) 65 - 99 mg/dL  Glucose, capillary     Status: Abnormal   Collection Time: 06/25/15 12:20 AM  Result Value Ref Range   Glucose-Capillary 143 (H) 65 - 99 mg/dL  Glucose, capillary     Status: None   Collection Time: 06/25/15  4:17 AM  Result Value Ref Range   Glucose-Capillary 97 65 - 99 mg/dL   No results found.  Assessment/Plan: Diagnosis: Acute nonhemorrhagic infarct right frontal operculum, insular cortex, right lenticular nuclear since superior temporal gyrus Labs and images independently reviewed.  Records reviewed and summated above. Stroke: Continue secondary stroke prophylaxis and Risk Factor  Modification listed below:   Antiplatelet therapy Blood Pressure Management:  Continue current medication with prn's with permisive HTN per primary team Statin Agent Diabetes management Tobacco abuse continuecounseling  1. Does the need for close, 24 hr/day medical supervision in concert with the patient's rehab needs make it unreasonable for this patient to be served in a less intensive setting? Potentially  2. Co-Morbidities requiring supervision/potential complications: end-stage renal disease on peritoneal dialysis  and noncompliant (continue recs per nephro), diabetes mellitus (Monitor in accordance with exercise and adjust meds as necessary) and HTN (monitor and provide prns in accordance with increased physical exertion and pain), anemia (transfuse if necessary to ensure appropriate perfusion for increased activity tolerance), tobacco abuse (cont counseling) 3. Due to safety, disease management, medication administration and patient education, does the patient require 24 hr/day rehab nursing? Potentially 4. Does the patient require coordinated care of a physician, rehab nurse, PT (1-2 hrs/day, 5 days/week) and OT (1-2 hrs/day, 5 days/week) to address physical and functional deficits in the context of the above medical diagnosis(es)? Potentially Addressing deficits in the following areas: balance, endurance, locomotion, strength, transferring, dressing, toileting and psychosocial support 5. Can the patient actively participate in an intensive therapy program of at least 3 hrs of therapy per day at least 5 days per week? Yes 6. The potential for patient to make measurable gains while on inpatient rehab is good 7. Anticipated functional outcomes upon discharge from inpatient rehab are modified independent and supervision  with PT, modified independent and supervision with OT, n/a with SLP. 8. Estimated rehab length of stay to reach the above functional goals is: 10-12 days. 9. Does the patient have adequate social supports and living environment to accommodate these discharge functional goals? Yes 10. Anticipated D/C setting: Home 11. Anticipated post D/C treatments: HH therapy and Home excercise program 12. Overall Rehab/Functional Prognosis: good  RECOMMENDATIONS: This patient's condition is appropriate for continued rehabilitative care in the following setting: Would recommend reevaluation by PT as last session was 3 days ago and patient was able to ambulate 150 feet despite being fatigued. It is likely the  patient has had a recent that point and may be too high functioning for IRF. Will follow up after reevaluation. Patient has agreed to participate in recommended program. Yes Note that insurance prior authorization may be required for reimbursement for recommended care.  Comment: Rehab Admissions Coordinator to follow up.  Delice Lesch, MD 06/25/2015

## 2015-06-25 NOTE — Progress Notes (Signed)
[facility] ROUNDING NOTE   Subjective:   Interval History: no complaints  Objective:  Vital signs in last 24 hours:  Temp:  [98 F (36.7 C)-98.7 F (37.1 C)] 98.6 F (37 C) (12/26 1611) Pulse Rate:  [77-96] 96 (12/26 1611) Resp:  [18-19] 18 (12/26 1611) BP: (113-151)/(56-78) 120/56 mmHg (12/26 1611) SpO2:  [95 %-99 %] 98 % (12/26 1611) Weight:  [90.2 kg (198 lb 13.7 oz)] 90.2 kg (198 lb 13.7 oz) (12/25 2050)  Weight change: -0.4 kg (-14.1 oz) Filed Weights   06/23/15 0800 06/23/15 2130 06/24/15 2050  Weight: 90.6 kg (199 lb 11.8 oz) 89.6 kg (197 lb 8.5 oz) 90.2 kg (198 lb 13.7 oz)    Intake/Output: I/O last 3 completed shifts: In: 1200 [P.O.:1200] Out: 0    Intake/Output this shift:  Total I/O In: 460 [P.O.:460] Out: 0  General:AAM NAD Heart: RRR Lungs: no rales Abdomen: soft NT- PD cath out Extremities: no edema Dialysis Access: Left IJ   Basic Metabolic Panel:  Recent Labs Lab 06/22/15 0327 06/23/15 0234 06/23/15 1356 06/24/15 0543 06/25/15 0707  NA 137 136 137 136 136  K 4.6 5.1 3.8 4.4 4.8  CL 99* 102 100* 98* 98*  CO2 26 22 24 24 22   GLUCOSE 92 106* 161* 104* 97  BUN 71* 92* 52* 65* 80*  CREATININE 15.82* 19.16* 12.62* 15.17* 18.04*  CALCIUM 8.1* 8.0* 8.2* 8.3* 8.3*  PHOS 7.0* 8.1* 5.0* 7.6* 8.6*    Liver Function Tests:  Recent Labs Lab 06/20/15 0307  06/22/15 0327 06/23/15 0234 06/23/15 1356 06/24/15 0543 06/25/15 0707  AST 34  --   --   --   --   --   --   ALT 31  --   --   --   --   --   --   ALKPHOS 57  --   --   --   --   --   --   BILITOT 0.4  --   --   --   --   --   --   PROT 6.5  --   --   --   --   --   --   ALBUMIN 2.7*  < > 2.4* 2.4* 2.5* 2.4* 2.5*  < > = values in this interval not displayed. No results for input(s): LIPASE, AMYLASE in the last 168 hours. No results for input(s): AMMONIA in the last 168 hours.  CBC:  Recent Labs Lab 06/20/15 0307  06/20/15 2028 06/21/15 0241 06/22/15 0327  06/23/15 0234 06/23/15 1356  WBC 15.7*  --   --  13.0* 12.5* 13.3* 12.6*  HGB 9.6*  < > 8.7* 8.5* 8.4* 7.5* 8.9*  HCT 29.0*  < > 26.2* 25.6* 25.1* 22.3* 26.6*  MCV 89.5  --   --  90.5 90.3 91.0 90.2  PLT 144*  --   --  163 175 159 162  < > = values in this interval not displayed.  Cardiac Enzymes: No results for input(s): CKTOTAL, CKMB, CKMBINDEX, TROPONINI in the last 168 hours.  BNP: Invalid input(s): POCBNP  CBG:  Recent Labs Lab 06/25/15 0020 06/25/15 0417 06/25/15 0753 06/25/15 1157 06/25/15 1608  GLUCAP 143* 97 86 115* 102*    Microbiology: Results for orders placed or performed during the hospital encounter of 06/15/15  MRSA PCR Screening     Status: None   Collection Time: 06/16/15  3:47 PM  Result Value Ref Range Status   MRSA by  PCR NEGATIVE NEGATIVE Final    Comment:        The GeneXpert MRSA Assay (FDA approved for NASAL specimens only), is one component of a comprehensive MRSA colonization surveillance program. It is not intended to diagnose MRSA infection nor to guide or monitor treatment for MRSA infections.     Coagulation Studies: No results for input(s): LABPROT, INR in the last 72 hours.  Urinalysis: No results for input(s): COLORURINE, LABSPEC, PHURINE, GLUCOSEU, HGBUR, BILIRUBINUR, KETONESUR, PROTEINUR, UROBILINOGEN, NITRITE, LEUKOCYTESUR in the last 72 hours.  Invalid input(s): APPERANCEUR    Imaging: No results found.   Medications:     . sodium chloride   Intravenous Once  . aspirin  325 mg Oral Daily  . calcitRIOL  0.25 mcg Oral Q T,Th,Sa-HD  . calcium acetate  667 mg Oral TID WC  . carvedilol  12.5 mg Oral BID WC  . chlorproMAZINE  12.5 mg Oral TID  . darbepoetin (ARANESP) injection - DIALYSIS  200 mcg Intravenous Q Sat-HD  . digoxin  0.125 mg Oral QODAY  . feeding supplement (NEPRO CARB STEADY)  237 mL Oral BID BM  . gentamicin cream  1 application Topical Daily  . insulin aspart  0-9 Units Subcutaneous 6 times per  day  . isosorbide-hydrALAZINE  1 tablet Oral TID  . lanthanum  1,000 mg Oral TID WC  . multivitamin  1 tablet Oral QHS  . pantoprazole  40 mg Oral BID  . pravastatin  20 mg Oral QPM   acetaminophen **OR** acetaminophen, calcium carbonate (dosed in mg elemental calcium), camphor-menthol **AND** hydrOXYzine, chlorproMAZINE (THORAZINE) IV, docusate sodium, feeding supplement (NEPRO CARB STEADY), hydrALAZINE, HYDROmorphone (DILAUDID) injection, ondansetron **OR** ondansetron (ZOFRAN) IV, oxyCODONE, oxyCODONE-acetaminophen, sorbitol, zolpidem  Assessment:  1. GIB 2/2 erosive esophagitis 2/2 NSAID use- no heparin    2. ESRD/Uremia -failed PD, catheter out- transitioned to HD; [facility] stay[facility] 12[facility]24  Plan HD Tuesday 12/27 3. Anemia - Hgb 8.9    - received 100 Aranesp 12/18 - for 200 next week; was 72% sat 12/17- redosed 12/24 at 200- was getting Epogen as an outpt but doesn't know dose. 4. Secondary hyperparathyroidism - P ^ 7-6 - on fosrenol 1gm and phoslo 1 ac tid iPTH 467 - start calcitriol 0.25 tiw- he was on before but doesn't know the dose  Calcium 8.3 5. HTN/volume - bidil and coreg 6. Nutrition - alb 2.4 - renal diet + suppl/vits 7. Disp - being evaluated for rehab  Plan - HD Tuesday  Rob Garrison MD [place] pager [phone]    cell [phone] 06/25/2015, 5:26 PM

## 2015-06-25 NOTE — Progress Notes (Signed)
Physical Therapy Treatment Patient Details Name: George [name] MRN: [id number] DOB: 11-27-1970 Today's Date: 06/25/2015    History of Present Illness George [name] is a 44 y.o. male with a history of ESRD 2/2 HTN, DM, HTN current smoker who presented with SOB and CP and found to be very uremic. He also endorses that he had sudden onset dizziness, and increased confusion in the middle of last week. MRI shows acute infarct involving the right frontal operculum, insular cortex, right lentiform nucleus, and superior temporal gyrus. Remote lacunar infarcts in the cerebellum noted bilaterally.     PT Comments    Pt is progressing with his mobility and retaining education from previous sessions.  He continues to show imbalance during both normal level gait and higher level dynamic gait activities.  He shows some compensations for left sided inattention, but they are not consistent yet.  He would continue to benefit from CIR level therapies to maximize his independence before returning home to [place].    Follow Up Recommendations  CIR;Supervision/Assistance - 24 hour     Equipment Recommendations  Cane    Recommendations for Other Services   NA     Precautions / Restrictions Precautions Precautions: Fall Precaution Comments: due to left sided weakness, L inattention    Mobility  Bed Mobility               General bed mobility comments: Pt seated EOB  Transfers Overall transfer level: Needs assistance Equipment used: None Transfers: Sit to/from Stand Sit to Stand: Min guard         General transfer comment: Min guard assist for safety. Pt relying on legs supported on bed and momentum/multiple attempts to get to standing from low surface.   Ambulation/Gait Ambulation/Gait assistance: Mod assist Ambulation Distance (Feet): 200 Feet Assistive device: None Gait Pattern/deviations: Step-through pattern;Decreased dorsiflexion - left;Decreased weight shift to left;Decreased stance  time - left     General Gait Details: Pt able to walk again without assistive device, however, when asked to do some of the harder tasks on the DGI, required mod assist to maintain balance.  Better scanning of left visual field with cues.     Stairs Stairs: Yes Stairs assistance: Min assist Stair Management: One rail Right;One rail Left;Step to pattern;Forwards Number of Stairs: 10 General stair comments: Pt was able to demonstrate step to technique that was taught to him last session and demonstrate correct/safest LE sequencing, but he still caught his foot on the step x 1 requiring min assist to prevent LOB.  Otherwise, he was min guard assist on the steps.       Modified Rankin (Stroke Patients Only) Modified Rankin (Stroke Patients Only) Pre-Morbid Rankin Score: No symptoms Modified Rankin: Moderately severe disability     Balance Overall balance assessment: Needs assistance Sitting-balance support: Feet supported;No upper extremity supported Sitting balance-Leahy Scale: Good     Standing balance support: No upper extremity supported Standing balance-Leahy Scale: Fair                      Cognition Arousal/Alertness: Awake/alert Behavior During Therapy: WFL for tasks assessed/performed Overall Cognitive Status: Within Functional Limits for tasks assessed                             Pertinent Vitals/Pain Pain Assessment: No/denies pain           PT Goals (current goals can now be found in the  care plan section) Acute Rehab PT Goals Patient Stated Goal: To return to [place] with finace Progress towards PT goals: Progressing toward goals    Frequency  Min 4X/week    PT Plan Current plan remains appropriate       End of Session   Activity Tolerance: Patient limited by fatigue Patient left: in bed (seated EOB)     Time: [id number] PT Time Calculation (min) (ACUTE ONLY): 12 min  Charges:  $Gait Training: 8-22 mins             [name], PT, DPT [phone]   06/25/2015, 4:48 PM

## 2015-06-25 NOTE — Progress Notes (Signed)
STROKE TEAM PROGRESS NOTE   HISTORY Jayston Folgar is a 44 y.o. male with a history of ESRD 2/2 HTN, DM, HTN current smoker who presented 06/15/2015  with SOB and CP and found to be very uremic. He also endorses that he had sudden onset dizziness, and increased confusion in the middle of last week. His Fiance states that at least since Sunday, he has had a left facial droop. He has been drowsy with some dysarthria, but this was attributed to uremia. He had no difficuly with walking. His LKW was unclear, likely the middle of last week, either 12/14 or 12/15. Neurology was consulted 06/20/2015.   Patient was not administered TPA secondary to delay in arrival, unclear time of onset.   SUBJECTIVE (INTERVAL HISTORY) Overall he feels his condition is much improved. No events overnight. He is on HD schedule. Left UE and LE muscle strength is close to baseline but still has left facial mild weakness. No definite Afib hx so far I can find. He is asking about whether the stroke could affect his sexual drive and his question is answered to his satisfaction.     OBJECTIVE Temp:  [98 F (36.7 C)-98.7 F (37.1 C)] 98 F (36.7 C) (12/26 0755) Pulse Rate:  [77-88] 80 (12/26 0755) Cardiac Rhythm:  [-] Normal sinus rhythm (12/26 0800) Resp:  [18-19] 18 (12/26 0755) BP: (113-151)/(56-78) 151/78 mmHg (12/26 0755) SpO2:  [95 %-99 %] 99 % (12/26 0755) Weight:  [198 lb 13.7 oz (90.2 kg)] 198 lb 13.7 oz (90.2 kg) (12/25 2050)  CBC:   Recent Labs Lab 06/23/15 0234 06/23/15 1356  WBC 13.3* 12.6*  HGB 7.5* 8.9*  HCT 22.3* 26.6*  MCV 91.0 90.2  PLT 159 0000000    Basic Metabolic Panel:   Recent Labs Lab 06/24/15 0543 06/25/15 0707  NA 136 136  K 4.4 4.8  CL 98* 98*  CO2 24 22  GLUCOSE 104* 97  BUN 65* 80*  CREATININE 15.17* 18.04*  CALCIUM 8.3* 8.3*  PHOS 7.6* 8.6*    Lipid Panel:     Component Value Date/Time   CHOL 91 06/21/2015 0241   TRIG 107 06/21/2015 0241   HDL 17* 06/21/2015 0241   CHOLHDL 5.4 06/21/2015 0241   VLDL 21 06/21/2015 0241   LDLCALC 53 06/21/2015 0241   HgbA1c:  Lab Results  Component Value Date   HGBA1C 5.6 06/21/2015   Urine Drug Screen: No results found for: LABOPIA, COCAINSCRNUR, LABBENZ, AMPHETMU, THCU, LABBARB    IMAGING I have personally reviewed the radiological images below and agree with the radiology interpretations.  Ct Head Wo Contrast 06/20/2015    There is a large low-density area occupying the right frontal and temporal lobes suggestive for an infarct.  There is thrombus in the right MCA at the right sylvian fissure and a small amount of sulci effacement around the infarct. Findings are suggestive for early subacute infarct in the right MCA territory. No evidence for acute hemorrhage or hemorrhagic transformation.   MRI and MRA Brain Wo Contrast 06/20/2015    1. Acute nonhemorrhagic infarct involving the right frontal operculum, insular cortex, right lentiform nucleus, and superior temporal gyrus.  2. Marked attenuation multiple M3 branch vessels associated with the infarct.  3. Focal mass effect with 1 - 2 mm midline shift and partial effacement of the right lateral ventricle. Mass effect is somewhat atypical. No discrete mass lesion is present. Follow-up MRI in 1-2 months is recommended to assure no underlying mass lesion and expected  evolution of the infarct.  4. Age advanced periventricular white matter disease reflects chronic microvascular ischemia.  5. Focal age advanced white matter changes within the left corona radiata.  6. Remote lacunar infarcts in the cerebellum bilaterally.   Dg Chest Port 1 View 06/19/2015   Tip of dialysis catheter projects in the right atrium. Negative for pneumothorax. Improved right pleural effusion and basilar airspace disease.   2D Echocardiogram  - Left ventricle: The cavity size was normal. Wall thickness wasincreased in a pattern of severe LVH. Systolic function wasnormal. The estimated  ejection fraction was in the range of 60%to 65%. Doppler parameters are consistent with abnormal leftventricular relaxation (grade 1 diastolic dysfunction). - Mitral valve: Severe nodular posterior annular calcification.Valve area by continuity equation (using LVOT flow): 2.66 cm^2. - Left atrium: The atrium was moderately dilated. - Atrial septum: No defect or patent foramen ovale was identified.   Bilateral lower extremity venous duplex - no DVT or SVT noted in the bilateral lower extremities.     Carotid Doppler - Bilateral: No significant (1-39%) ICA stenosis. Antegrade vertebral flow.    PHYSICAL EXAM Pleasant middle-aged African-American male not in distress. . Afebrile. Head is nontraumatic. Neck is supple without bruit.    Cardiac exam no murmur or gallop. Lungs are clear to auscultation. Distal pulses are well felt. Neurological Exam : Awake alert oriented 3 normal language and mild dysarthria, soft voice. Can be understood with some difficulty due to volume. Extraocular movements are full range without nystagmus. Pupils are equal reactive. Fundi were not visualized. Visual fields seem full to bedside confrontational testing. Mild left lower facial weakness. Tongue towards left. Motor system exam revealed mild left hemiplegia improved with 5-/5 strength with significant weakness of left grip, intrinsic hand muscles, left hip flexors and ankle dorsiflexors. Deep tendon reflexes are 1+ symmetric except ankle drugs are depressed. Plantars are downgoing. Sensation is preserved bilaterally. Coordination intact b/l and gait not tested due to safety concerns.   ASSESSMENT/PLAN Mr. Derious Ahmann is a 44 y.o. male with history of ESRD, HTN, DM, and current smoker who initially presented to [facility] with SOB and CP and found to be uremic. During hospitalization, he was found to have left hemiparesis and MRI showed right MCA infarct.   Stroke:  right MCA moderate sized infarct, embolic secondary to  unknown source   Resultant left mild facial droop and mild hemiparesis  MRI  right acute MCA territory moderate sized infarct. Old B cerebellar lacunes.  MRA  Attenuated M3 vessels associated with infarct.  Carotid Doppler unremarkable   LE venous Dopplers negative for DVT  2D Echo - EF 60-65% No source of embolus   LDL 53  HgbA1c 5.4  SCDs for VTE prophylaxis Diet renal/carb modified with fluid restriction Diet-HS Snack?: Nothing; Room service appropriate?: Yes; Fluid consistency:: Thin  aspirin 81 mg daily prior to admission, now on ASA 325mg  for stroke prevention.   Ongoing aggressive stroke risk factor management  Therapy recommendations:  CIR  Disposition:  pending   Questionable Atrial Fibrillation Questionable afib in the past as per chart No relevant documentation so far to confirm that  continue ASA 325mg    So far ECG and tele did not show afib If afib confirmed, may consider NOAC for stroke prevention. Recommend 30 day cardiac event monitoring to rule out afib if no definite evidence of afib on discharge.  GIB   GI on board, did not find source of bleeding, considering esophageal tearing due to vomiting  Received  3 PRBC  Stable H&H  No more bleeding  Put back on ASA  From stroke standpoint, OK to start anticoagulation if afib found   Hypertension  Fluctuate BP  On coreg, HCTZ, isosorbide  BP goal 120-140  Hyperlipidemia  Home meds:  Pravachol 20, resumed in hospital  LDL 53, goal < 70  Continue statin at discharge  Diabetes  HgbA1c 5.4, at goal < 7.0  Well controlled  On SSI  ESRD  On HD  Nephrology on board  Tobacco abuse  Current smoker  Smoking cessation counseling provided  Pt is willing to quit  Other Stroke Risk Factors  THC use  Family hx stroke (grandmother)  Other Active Problems  Elevated troponins - trending down - demand ischemia likely  Acute on chronic encephalopathy  Follow-up MRI in 1-2  months is recommended to assure no underlying mass lesion and expected evolution of the infarct.   Hospital day # 9  Neurology will sign off. Please call with questions. Pt will follow up with Dr. Erlinda Hong at [facility] in about 2 months. Thanks for the consult.  Rosalin Hawking, MD PhD Stroke Neurology 06/25/2015 4:17 PM   To contact Stroke Continuity provider, please refer to [email or web address] After hours, contact General Neurology Following by is

## 2015-06-25 NOTE — Progress Notes (Signed)
Rehab admissions - Awaiting PT and OT to see patient again to determine current level of functioning and what rehab venue might be needed.  Will follow up tomorrow.  Call me for questions.  [phone]

## 2015-06-26 ENCOUNTER — Encounter (HOSPITAL_COMMUNITY): Payer: Self-pay | Admitting: General Surgery

## 2015-06-26 LAB — RENAL FUNCTION PANEL
ALBUMIN: 2.4 g/dL — AB (ref 3.5–5.0)
Anion gap: 17 — ABNORMAL HIGH (ref 5–15)
BUN: 99 mg/dL — AB (ref 6–20)
CHLORIDE: 100 mmol/L — AB (ref 101–111)
CO2: 21 mmol/L — ABNORMAL LOW (ref 22–32)
Calcium: 8.1 mg/dL — ABNORMAL LOW (ref 8.9–10.3)
Creatinine, Ser: 20.25 mg/dL — ABNORMAL HIGH (ref 0.61–1.24)
GFR calc Af Amer: 3 mL/min — ABNORMAL LOW (ref >60)
GFR, EST NON AFRICAN AMERICAN: 2 mL/min — AB (ref >60)
Glucose, Bld: 103 mg/dL — ABNORMAL HIGH (ref 65–99)
POTASSIUM: 5 mmol/L (ref 3.5–5.1)
Phosphorus: 8.7 mg/dL — ABNORMAL HIGH (ref 2.5–4.6)
Sodium: 138 mmol/L (ref 135–145)

## 2015-06-26 LAB — CBC
HCT: 24.5 % — ABNORMAL LOW (ref 39.0–52.0)
HEMOGLOBIN: 8.3 g/dL — AB (ref 13.0–17.0)
MCH: 31.1 pg (ref 26.0–34.0)
MCHC: 33.9 g/dL (ref 30.0–36.0)
MCV: 91.8 fL (ref 78.0–100.0)
Platelets: 231 10*3/uL (ref 150–400)
RBC: 2.67 MIL/uL — AB (ref 4.22–5.81)
RDW: 16.9 % — ABNORMAL HIGH (ref 11.5–15.5)
WBC: 13.2 10*3/uL — ABNORMAL HIGH (ref 4.0–10.5)

## 2015-06-26 LAB — GLUCOSE, CAPILLARY
GLUCOSE-CAPILLARY: 108 mg/dL — AB (ref 65–99)
GLUCOSE-CAPILLARY: 122 mg/dL — AB (ref 65–99)
GLUCOSE-CAPILLARY: 139 mg/dL — AB (ref 65–99)
Glucose-Capillary: 113 mg/dL — ABNORMAL HIGH (ref 65–99)
Glucose-Capillary: 89 mg/dL (ref 65–99)

## 2015-06-26 MED ORDER — PANTOPRAZOLE SODIUM 40 MG PO TBEC: 40.0000 mg | DELAYED_RELEASE_TABLET | Freq: Two times a day (BID) | ORAL | Status: DC

## 2015-06-26 MED ORDER — ASPIRIN 325 MG PO TBEC: 325.0000 mg | DELAYED_RELEASE_TABLET | Freq: Every day | ORAL | Status: DC

## 2015-06-26 MED ORDER — NEPRO/CARBSTEADY PO LIQD: 237.0000 mL | Freq: Two times a day (BID) | ORAL | Status: DC

## 2015-06-26 MED ORDER — LIDOCAINE-PRILOCAINE 2.5-2.5 % EX CREA: 1.0000 "application " | TOPICAL_CREAM | CUTANEOUS | Status: DC | PRN

## 2015-06-26 MED ORDER — HEPARIN SODIUM (PORCINE) 1000 UNIT/ML DIALYSIS: 1000.0000 [IU] | INTRAMUSCULAR | Status: DC | PRN

## 2015-06-26 MED ORDER — CALCITRIOL 0.25 MCG PO CAPS: 0.2500 ug | ORAL_CAPSULE | ORAL | Status: AC

## 2015-06-26 MED ORDER — CALCIUM CARBONATE 1250 MG/5ML PO SUSP: 500.0000 mg | Freq: Four times a day (QID) | ORAL | Status: DC | PRN

## 2015-06-26 MED ORDER — SODIUM CHLORIDE 0.9 % IV SOLN: 100.0000 mL | INTRAVENOUS | Status: DC | PRN

## 2015-06-26 MED ORDER — ALTEPLASE 2 MG IJ SOLR
2.0000 mg | Freq: Once | INTRAMUSCULAR | Status: DC | PRN
  Filled 2015-06-26: qty 2

## 2015-06-26 MED ORDER — PENTAFLUOROPROP-TETRAFLUOROETH EX AERO: 1.0000 "application " | INHALATION_SPRAY | CUTANEOUS | Status: DC | PRN

## 2015-06-26 MED ORDER — CALCIUM ACETATE (PHOS BINDER) 667 MG PO CAPS: 667.0000 mg | ORAL_CAPSULE | Freq: Three times a day (TID) | ORAL | Status: DC

## 2015-06-26 MED ORDER — DARBEPOETIN ALFA 200 MCG/0.4ML IJ SOSY: 200.0000 ug | PREFILLED_SYRINGE | INTRAMUSCULAR | Status: DC

## 2015-06-26 MED ORDER — ASPIRIN EC 325 MG PO TBEC
325.0000 mg | DELAYED_RELEASE_TABLET | Freq: Every day | ORAL | Status: DC
  Administered 2015-06-26: 325 mg via ORAL
  Filled 2015-06-26: qty 1

## 2015-06-26 MED ORDER — LIDOCAINE HCL (PF) 1 % IJ SOLN: 5.0000 mL | INTRAMUSCULAR | Status: DC | PRN

## 2015-06-26 MED ORDER — NEPRO/CARBSTEADY PO LIQD: 237.0000 mL | Freq: Three times a day (TID) | ORAL | Status: DC | PRN

## 2015-06-26 MED ORDER — HEPARIN SODIUM (PORCINE) 1000 UNIT/ML DIALYSIS: 2000.0000 [IU] | INTRAMUSCULAR | Status: DC | PRN

## 2015-06-26 NOTE — Progress Notes (Signed)
Pt in a procedure and will see later as time allows.    Mee Hives, PT MS Acute Rehab Dept. Number: [facility] [id number] and [employer] [phone]

## 2015-06-26 NOTE — Care Management Note (Signed)
Case Management Note  Patient Details  Name: George [name] MRN: [id number] Date of Birth: Nov 03, 1970  Subjective/Objective:           CM following for progression and d/c planning.         Action/Plan: 06/26/2015 Noted that pt has declined CIR for rehab and plans to d/c to home. Will follow for [facility] needs.   Expected Discharge Date:  06/27/2015               Expected Discharge Plan:  [place]  In-House Referral:  NA  Discharge planning Services  CM Consult  Post Acute Care Choice:    Choice offered to:     DME Arranged:    DME Agency:     HH Arranged:    HH Agency:     Status of Service:  In process, will continue to follow  Medicare Important Message Given:  Yes Date Medicare IM Given:    Medicare IM give by:    Date Additional Medicare IM Given:    Additional Medicare Important Message give by:     If discussed at [place] of Stay Meetings, dates discussed:    Additional Comments:  Adron Bene, RN 06/26/2015, 2:12 PM

## 2015-06-26 NOTE — Progress Notes (Signed)
Physical Therapy Treatment Patient Details Name: George [name] MRN: [id number] DOB: 1971-06-07 Today's Date: 06/26/2015    History of Present Illness George [name] is a 44 y.o. male with a history of ESRD 2/2 HTN, DM, HTN current smoker who presented with SOB and CP and found to be very uremic. He also endorses that he had sudden onset dizziness, and increased confusion in the middle of last week. MRI shows acute infarct involving the right frontal operculum, insular cortex, right lentiform nucleus, and superior temporal gyrus. Remote lacunar infarcts in the cerebellum noted bilaterally.     PT Comments    Pt was seen for gait and check of strength after HD appt today, agreed to get outpatient therapy if all other plans fail for rehab.  Pt is apparently refusing CIR but did agree to outpatient despite his loss of transportation to get there.  Will continue PT as pt remains in hospital.  Follow Up Recommendations  CIR;Supervision/Assistance - 24 hour     Equipment Recommendations  Cane    Recommendations for Other Services Rehab consult     Precautions / Restrictions Precautions Precautions: Fall Precaution Comments: Pt catches L foot mildly but no LOB Restrictions Weight Bearing Restrictions: No Other Position/Activity Restrictions: declines to use AD    Mobility  Bed Mobility Overal bed mobility: Modified Independent                Transfers Overall transfer level: Modified independent Equipment used: None Transfers: Sit to/from [facility] Sit to Stand: Supervision Stand pivot transfers: Supervision       General transfer comment: reminders for hand placement  Ambulation/Gait Ambulation/Gait assistance: Supervision Ambulation Distance (Feet): 250 Feet Assistive device: None Gait Pattern/deviations: Step-to pattern;Step-through pattern;Decreased stride length;Wide base of support;Drifts right/left Gait velocity: normal Gait velocity  interpretation: at or above normal speed for age/gender General Gait Details: Pt is walking with no assistance and declining to use SPC, no LOB on hallway   Stairs            Wheelchair Mobility    Modified Rankin (Stroke Patients Only) Modified Rankin (Stroke Patients Only) Pre-Morbid Rankin Score: No symptoms Modified Rankin: Slight disability     Balance     Sitting balance-Leahy Scale: Good       Standing balance-Leahy Scale: Fair                      Cognition Arousal/Alertness: Awake/alert Behavior During Therapy: WFL for tasks assessed/performed Overall Cognitive Status: Within Functional Limits for tasks assessed                      Exercises      General Comments        Pertinent Vitals/Pain Pain Assessment: No/denies pain    Home Living                      Prior Function            PT Goals (current goals can now be found in the care plan section) Progress towards PT goals: Progressing toward goals    Frequency  Min 4X/week    PT Plan Current plan remains appropriate    Co-evaluation             End of Session Equipment Utilized During Treatment: Gait belt Activity Tolerance: Patient limited by fatigue Patient left: in bed (sitting then back to bed to nap after HD)  Time: [id number] PT Time Calculation (min) (ACUTE ONLY): 27 min  Charges:  $Gait Training: 8-22 mins $Therapeutic Exercise: 8-22 mins                    G Codes:      Ramond Dial Jul 05, 2015, 4:09 PM   Mee Hives, PT MS Acute Rehab Dept. Number: [facility] [id number] and [place] [phone]

## 2015-06-26 NOTE — Care Management Note (Signed)
Case Management Note  Patient Details  Name: George [name] MRN: [id number] Date of Birth: 06-Mar-1971  Subjective/Objective:          CM following for progression and d/c planning.          Action/Plan: 06/26/2015 Met with pt and fiancee, re d/c plans. Noted that pt has declined CIR for ongoing rehab. His plan to get to hemodialysis is to ride the SC[employer] transportation. He is unaware that the application may take some time and has no alternative plan for transportation. This CM offered to call the CSW to begin the SCAT application. CSW Cassandra notified, she will see pt and discuss SCAT as well as Medicaid transportation.  The pt also asked about "housing", this CM also referred this to the CSW. It was our understanding that the pt would be with his parents and then planned to return to [place]. Unclear what his housing issues are, as he stated he still plans to return to [place].  Await [place] orders for HHPT and HHOT as recommended.  Pt states that he has no DME needs.   Expected Discharge Date:  06/20/15               Expected Discharge Plan:  [place]  In-House Referral:  NA  Discharge planning Services  CM Consult  Post Acute Care Choice:    Choice offered to:     DME Arranged:    DME Agency:     HH Arranged:    HH Agency:     Status of Service:  In process, will continue to follow  Medicare Important Message Given:  Yes Date Medicare IM Given:    Medicare IM give by:    Date Additional Medicare IM Given:    Additional Medicare Important Message give by:     If discussed at [place] of Stay Meetings, dates discussed:    Additional Comments:  Adron Bene, RN 06/26/2015, 2:31 PM

## 2015-06-26 NOTE — Progress Notes (Signed)
   06/26/15 1000  OT Visit Information  Last OT Received On 06/26/15  Assistance Needed +1  Reason Eval/Treat Not Completed Patient at procedure or test/ unavailable;Other (comment) (Pt off floor for procedure. OT to reattempt as schedule permits.)  History of Present Illness George [name] is a 44 y.o. male with a history of ESRD 2/2 HTN, DM, HTN current smoker who presented with SOB and CP and found to be very uremic. He also endorses that he had sudden onset dizziness, and increased confusion in the middle of last week. MRI shows acute infarct involving the right frontal operculum, insular cortex, right lentiform nucleus, and superior temporal gyrus. Remote lacunar infarcts in the cerebellum noted bilaterally.

## 2015-06-26 NOTE — Progress Notes (Signed)
Rehab admissions - I met with patient and his fiance.  Both feel that patient is doing much better and does not need an inpatient rehab stay.  Patient feels he can discharge home to mom and step day once MD feels he is medically ready.  Call me for questions.  #[phone] 

## 2015-06-26 NOTE — Progress Notes (Signed)
[facility] ROUNDING NOTE   Subjective:   Interval History: no complaints, feeling stronger and appetite better. Has had HD x 2 now.   Objective:  Vital signs in last 24 hours:  Temp:  [98.1 F (36.7 C)-98.6 F (37 C)] 98.1 F (36.7 C) (12/27 1402) Pulse Rate:  [84-100] 100 (12/27 1402) Resp:  [14-22] 17 (12/27 1402) BP: (120-190)/(56-141) 128/78 mmHg (12/27 1402) SpO2:  [97 %-100 %] 100 % (12/27 1402) Weight:  [88.8 kg (195 lb 12.3 oz)-91.4 kg (201 lb 8 oz)] 88.8 kg (195 lb 12.3 oz) (12/27 1125)  Weight change: 1.2 kg (2 lb 10.3 oz) Filed Weights   06/25/15 2011 06/26/15 0710 06/26/15 1125  Weight: 91.4 kg (201 lb 8 oz) 89.2 kg (196 lb 10.4 oz) 88.8 kg (195 lb 12.3 oz)    Intake/Output: I/O last 3 completed shifts: In: 940 [P.O.:940] Out: 0    Intake/Output this shift:    General:AAM NAD Heart: RRR Lungs: no rales Abdomen: soft NT- PD cath out Extremities: no edema Dialysis Access: Left IJ   Basic Metabolic Panel:  Recent Labs Lab 06/23/15 0234 06/23/15 1356 06/24/15 0543 06/25/15 0707 06/26/15 0724  NA 136 137 136 136 138  K 5.1 3.8 4.4 4.8 5.0  CL 102 100* 98* 98* 100*  CO2 22 24 24 22  21*  GLUCOSE 106* 161* 104* 97 103*  BUN 92* 52* 65* 80* 99*  CREATININE 19.16* 12.62* 15.17* 18.04* 20.25*  CALCIUM 8.0* 8.2* 8.3* 8.3* 8.1*  PHOS 8.1* 5.0* 7.6* 8.6* 8.7*    Liver Function Tests:  Recent Labs Lab 06/20/15 0307  06/23/15 0234 06/23/15 1356 06/24/15 0543 06/25/15 0707 06/26/15 0724  AST 34  --   --   --   --   --   --   ALT 31  --   --   --   --   --   --   ALKPHOS 57  --   --   --   --   --   --   BILITOT 0.4  --   --   --   --   --   --   PROT 6.5  --   --   --   --   --   --   ALBUMIN 2.7*  < > 2.4* 2.5* 2.4* 2.5* 2.4*  < > = values in this interval not displayed. No results for input(s): LIPASE, AMYLASE in the last 168 hours. No results for input(s): AMMONIA in the last 168 hours.  CBC:  Recent Labs Lab  06/21/15 0241 06/22/15 0327 06/23/15 0234 06/23/15 1356 06/26/15 1025  WBC 13.0* 12.5* 13.3* 12.6* 13.2*  HGB 8.5* 8.4* 7.5* 8.9* 8.3*  HCT 25.6* 25.1* 22.3* 26.6* 24.5*  MCV 90.5 90.3 91.0 90.2 91.8  PLT 163 175 159 162 231    Cardiac Enzymes: No results for input(s): CKTOTAL, CKMB, CKMBINDEX, TROPONINI in the last 168 hours.  BNP: Invalid input(s): POCBNP  CBG:  Recent Labs Lab 06/25/15 1608 06/25/15 2010 06/26/15 0035 06/26/15 0413 06/26/15 1249  GLUCAP 102* 125* 108* 113* 66    Microbiology: Results for orders placed or performed during the hospital encounter of 06/15/15  MRSA PCR Screening     Status: None   Collection Time: 06/16/15  3:47 PM  Result Value Ref Range Status   MRSA by PCR NEGATIVE NEGATIVE Final    Comment:        The GeneXpert MRSA Assay (FDA approved for NASAL specimens  only), is one component of a comprehensive MRSA colonization surveillance program. It is not intended to diagnose MRSA infection nor to guide or monitor treatment for MRSA infections.     Coagulation Studies: No results for input(s): LABPROT, INR in the last 72 hours.  Urinalysis: No results for input(s): COLORURINE, LABSPEC, PHURINE, GLUCOSEU, HGBUR, BILIRUBINUR, KETONESUR, PROTEINUR, UROBILINOGEN, NITRITE, LEUKOCYTESUR in the last 72 hours.  Invalid input(s): APPERANCEUR    Imaging: No results found.   Medications:     . aspirin EC  325 mg Oral Daily  . calcitRIOL  0.25 mcg Oral Q T,Th,Sa-HD  . calcium acetate  667 mg Oral TID WC  . carvedilol  12.5 mg Oral BID WC  . chlorproMAZINE  12.5 mg Oral TID  . darbepoetin (ARANESP) injection - DIALYSIS  200 mcg Intravenous Q Sat-HD  . digoxin  0.125 mg Oral QODAY  . feeding supplement (NEPRO CARB STEADY)  237 mL Oral BID BM  . gentamicin cream  1 application Topical Daily  . insulin aspart  0-9 Units Subcutaneous 6 times per day  . isosorbide-hydrALAZINE  1 tablet Oral TID  . lanthanum  1,000 mg Oral TID WC   . multivitamin  1 tablet Oral QHS  . pantoprazole  40 mg Oral BID  . pravastatin  20 mg Oral QPM   acetaminophen **OR** acetaminophen, calcium carbonate (dosed in mg elemental calcium), camphor-menthol **AND** hydrOXYzine, docusate sodium, feeding supplement (NEPRO CARB STEADY), HYDROmorphone (DILAUDID) injection, ondansetron **OR** ondansetron (ZOFRAN) IV, sorbitol, zolpidem  Assessment:  1 ESRD/Uremia -failed PD and PD cath removed. 2nd HD today. Uremia resolving. Starting to feel better. CLIP'd to [place] TTS schedule.  2 GIB 2/2 erosive esophagitis 2/2 NSAID use- no heparin  3 Anemia - Hgb 8.9    - received 100 Aranesp 12/18 and 200 on 12/24 4 Secondary hyperparathyroidism - P ^ 7-6 - on fosrenol 1gm and phoslo 1 ac tid iPTH 467 - start calcitriol 0.25 tiw- Calcium 8.3 5 HTN/volume - bidil and coreg 6 Nutrition - alb 2.4 - renal diet + suppl/vits 7 Disp - to go home when medically stable  Plan - HD Wed am then dc home after HD tomorrow. He will then have outpatient HD on Thursday on TTS schedule at [facility].    Kelly Splinter MD [employer] pager [phone]    cell [phone] 06/26/2015, 2:31 PM

## 2015-06-26 NOTE — Discharge Summary (Signed)
Physician Discharge Summary  George [name] [id number] DOB: 1970-11-01 DOA: 06/15/2015  PCP: No primary care provider on file.  Admit date: 06/15/2015 Discharge date: 06/27/2015  Time spent: 30  minutes  Recommendations for Outpatient Follow-up:  1. Please follow upw ith Dr Erlinda Hong in 2 months, at [facility] neurology 2. Please follow up the records [facility] regarding his ? Atrial fibrillation.  3. Please follow upw ith PCP in 1 to 2 weeks.  4. Please follow up with HD on Thursday.  5. FOLLOW up with MRI in 1 to 2 months to ensure there is mass .  6. Discharge patient on 12/28 after HD to home with home health PT/OT.    Discharge Diagnoses:  Principal Problem:   Uremia Active Problems:   Hypoxia   Acute respiratory failure (HCC)   ESRD on peritoneal dialysis ([place])   Diabetes mellitus without complication ([place])   Essential hypertension   Pleuritic chest pain   SOB (shortness of breath)   Hematemesis   Hematemesis with nausea   ESRD (end stage renal disease) on dialysis (HCC)   Right leg pain   Gastrointestinal hemorrhage with melena   Confusion   Cerebral embolism with cerebral infarction   ESRD (end stage renal disease) (HCC)   Type 2 diabetes mellitus with complication (HCC)   Absolute anemia   Tobacco abuse   Hemiparesis affecting left side as late effect of stroke ([place])   Cerebral infarction due to embolism of right middle cerebral artery ([place])   Discharge Condition: improved  Diet recommendation: low sodium / renal diet.  Filed Weights   06/25/15 2011 06/26/15 0710 06/26/15 1125  Weight: 91.4 kg (201 lb 8 oz) 89.2 kg (196 lb 10.4 oz) 88.8 kg (195 lb 12.3 oz)    History of present illness:  George [name] is a 44 y.o. male with a history of ESRD on Peritoneal dialysis, DM2, and HTN who presents to the ED with complaints of right sided chest pain and SOB. He was found to have hypoxemia in the ED to 85%. He was placed on NCO2 at 2 liters. He was referred  for further workup.  Patient was admitted for uremia secondary to under dialysis with CCPD. Patient also noted during the hospitalization to have coffee-ground emesis was placed on a Protonix drip and transfused 2 units packed red blood cells. GI was consulted, patient initially refused upper endoscopy. But he underwent it on 12/22 and was found to have mild erosive esophagitis. He was started on PPI. He was found to be lethargic and a ct head without contrast showed that he had a sub acute stroke. Neurology was consulted and stroke work up initiated.  We are currently waiting for records from [facility] to see if he has a h/o atrial fibrillation, to start him on anticoagulation vs aspirin 325 mg . Its been three days and we have not received any records yet, so I  empirically started him on 325 mg of aspirin daily and when have more information reg atrial fib then change to eliquis. Discussed with Dr Leonie Man on 12/23. No records yet as of 12/26.   Hospital Course:   #1 uremia/under dialysis/end-stage renal disease on peritoneal dialysis Patient presented with uremia, probably from missing peritoneal dialysis. Patient denies any chest pain. Patient denies any shortness of breath.  Patient had vein mapping and and for access placement and tunneled dialysis catheter placement on 12/20 per vascular surgery for transition to HD. Per nephrology. Removal of PD catheter  on 12/23.   #2 hematemesis/coffee-ground emesis Patient with coffee-ground emesis on 06/17/2015. Patient endorsed recent use of ibuprofen 800 mg 4 times daily for right leg pain. Patient denies daily chronic alcohol use. Per wife patient with prior history of duodenal ulcer. He was started on PPI drip. Patient status post 2 units packed red blood cells . Patient was seen in consultation by gastroenterology, Dr. Penelope Coop who had recommended an upper endoscopy however patient refused initially but agreed to it. Transfusion  threshold hemoglobin less than 7. GI following and appreciate input and recommendations. He was scheduled for EGD on 12/21, but his CT head revealed to have a subacute CVA. His EGD was postponed to 12/22. He underwent EGD today, showed mild erosive esophagitis. Recommend continuing PPI oral and monitor hemoglobin. To transfuse when hemoglobin is less than 7.   #3 hypertension Continue Norvasc, and coreg Better controlled.   #4 well-controlled diabetes mellitus Patient on oral diabetic medications at home. Hemoglobin A1c = 5.4. Continue sliding scale insulin. Hold oral hypoglycemic agents. Follow.  #5 anemia/acute blood loss anemia Likely multifactorial secondary to acute blood loss anemia and anemia of chronic disease. Anemia panel with iron of 145 TIBC of 200 ferritin of 846 consistent with anemia of chronic disease likely secondary to end-stage renal disease on hemodialysis. Patient with coffee-ground emesis on 06/17/2015. He was also found to have melanotic stools on 12/20 . Patient status post 2 units packed red blood cells. Continue Protonix. Transfusion threshold hemoglobin less than 7. Underwent EGD showed mild erosive esophagitis. Recommend to continue the PPI.  #6 elevated troponins Patient denies any acute chest pain. Troponins trending down..If acute coronary syndrome may be secondary to problem #1. 2-D echo with EF of 60-65% with severe LVH, grade 1 diastolic dysfunction. Severe nodular posterior annular calcification on the mitral valve. Continue coreg, , digoxin. Follow.  #7 shortness of breath Likely secondary to hypervolemia and under hemodialysis. VQ scan negative for PE. Lower extremity Dopplers negative for DVT or SVT. Patient on CPPD per renal  converted to HD. Resolved.  #8 prophylaxis PPI for GI prophylaxis. SCDs for DVT prophylaxis.   #9 acute on chronic encephalopathy: CT head without contrast, which revealed a sub acute right MCA stroke. Stroke work up ordered with  an MRI of the brain and MRA head and neck. MRi brain showed acute non hemorrhagic infarct involving the right frontal operculum , insular cortex, rigth lentiform nucleus and superior temporal gyrus, marked attenuation multiple M3 branch vessels. Focal mass effect with 1 to 2 mm midline shift and partial effacement of the right lateral ventricle. Mass effect is atypical. Recommend follow up MRI in 1 to 2 months, to assure no underlying mass lesion and expected evolution of the infarct. Also found remote lacunar infarcts int he cerebellum bilaterally. Carotid duplex showed no significant ICA stenosis. Echocardiogram from 12/19 showed EF of 65% with grade 1 diastolic dysfunction. Speech recommended, continued outpatient SLP eval.  Neurology consulted and recommendations given. There was a question of atrial fibrillation in the past. Discussed with the patient and RN, plan to obtain records from [facility] to start eliquis as soon as possible. Meanwhile its been two days and we have not received any, so we will start him on aspirin 325mg  empirically, and can change to anticoagulation when we receive records.   PT recommended CIR, but patient refused and wanted to go home.    Procedures:  V/Q scan 06/16/2015  CXR 06/16/2015  2-D echo 06/18/2015  2 units packed  red blood cells 06/17/2015  MRI brain  MRA head and neck.  EGD 12/22  PD catheter removed on 12/23    Consultations:  Nephrology: Dr. Jonnie Finner 06/16/2015  Gastroenterology: Dr. Penelope Coop 06/17/2015  Neurology  Inpatient rehabilitation.   Discharge Exam: Filed Vitals:   06/26/15 1217 06/26/15 1402  BP: 190/89 128/78  Pulse: 84 100  Temp: 98.1 F (36.7 C) 98.1 F (36.7 C)  Resp: 18 17    General: alert afebrile comfortable Cardiovascular: [id number] Respiratory: ctab  Discharge Instructions   Discharge Instructions    Ambulatory referral to Neurology    Complete by:  As directed   Pt will follow up with Dr. Erlinda Hong at  [facility] in about 2 months. Thanks.          Current Discharge Medication List    START taking these medications   Details  calcitRIOL (ROCALTROL) 0.25 MCG capsule Take 1 capsule (0.25 mcg total) by mouth Every Tuesday,Thursday,and Saturday with dialysis.    calcium acetate (PHOSLO) 667 MG capsule Take 1 capsule (667 mg total) by mouth 3 (three) times daily with meals.    calcium carbonate, dosed in mg elemental calcium, 1250 (500 Ca) MG/5ML Take 5 mLs (500 mg of elemental calcium total) by mouth every 6 (six) hours as needed for indigestion. Qty: 450 mL    Darbepoetin Alfa (ARANESP) 200 MCG/0.4ML SOSY injection Inject 0.4 mLs (200 mcg total) into the vein every Saturday with hemodialysis. Qty: 1.68 mL    !! Nutritional Supplements (FEEDING SUPPLEMENT, NEPRO CARB STEADY,) LIQD Take 237 mLs by mouth 3 (three) times daily as needed (Supplement). Refills: 0    !! Nutritional Supplements (FEEDING SUPPLEMENT, NEPRO CARB STEADY,) LIQD Take 237 mLs by mouth 2 (two) times daily between meals. Refills: 0    pantoprazole (PROTONIX) 40 MG tablet Take 1 tablet (40 mg total) by mouth 2 (two) times daily. Qty: 60 tablet, Refills: 1     !! - Potential duplicate medications found. Please discuss with provider.    CONTINUE these medications which have CHANGED   Details  aspirin EC 325 MG EC tablet Take 1 tablet (325 mg total) by mouth daily. Qty: 30 tablet, Refills: 0      CONTINUE these medications which have NOT CHANGED   Details  carvedilol (COREG) 25 MG tablet Take 25 mg by mouth 2 (two) times daily with a meal.    digoxin (LANOXIN) 0.125 MG tablet Take 0.125 mg by mouth every other day.    isosorbide-hydrALAZINE (BIDIL) 20-37.5 MG tablet Take 1 tablet by mouth 3 (three) times daily.    lanthanum (FOSRENOL) 1000 MG chewable tablet Chew 1,000 mg by mouth 3 (three) times daily with meals.    multivitamin (RENA-VIT) TABS tablet Take 1 tablet by mouth daily.    pravastatin (PRAVACHOL) 20 MG  tablet Take 20 mg by mouth every evening.    sitaGLIPtin (JANUVIA) 25 MG tablet Take 25 mg by mouth daily.      STOP taking these medications     amLODipine (NORVASC) 5 MG tablet      furosemide (LASIX) 80 MG tablet      ibuprofen (ADVIL,MOTRIN) 800 MG tablet      losartan (COZAAR) 25 MG tablet      cyclobenzaprine (FLEXERIL) 10 MG tablet        Allergies  Allergen Reactions  . Lisinopril Shortness Of Breath    Pt's family said he had SOB, Chest pressure , and coughing    Follow-up Information  Follow up with Ruta Hinds, MD In 6 weeks.   Specialties:  Vascular Surgery, Cardiology   Why:  Office will call you to arrange your appt (sent)   Contact information:   [address] [place] [address] [phone]       Schedule an appointment as soon as possible for a visit with [facility].   Specialty:  General Surgery   Why:  As needed   Contact information:   [address] [place] [phone]      Follow up with Xu,Jindong, MD. Schedule an appointment as soon as possible for a visit in 2 months.   Specialty:  Neurology   Why:  stroke clinic   Contact information:   [address] [place] [address] [phone]        The results of significant diagnostics from this hospitalization (including imaging, microbiology, ancillary and laboratory) are listed below for reference.    Significant Diagnostic Studies: Dg Chest 2 View  06/15/2015  CLINICAL DATA:  Right-sided chest pain.  Cough for 3 days. EXAM: CHEST  2 VIEW COMPARISON:  None FINDINGS: Heart size is normal. Small right pleural effusion noted. No left effusion identified. There is pleural and parenchymal scarring within the right midlung and right base noted. Left lung appears clear. IMPRESSION: 1. Chronic appearing pleural and parenchymal scarring involving the right midlung and right base. The over 2 suspect small right  effusion. Electronically Signed   By: Kerby Moors M.D.   On: 06/15/2015 20:45   Ct Head Wo Contrast  06/20/2015  CLINICAL DATA:  Confusion. EXAM: CT HEAD WITHOUT CONTRAST TECHNIQUE: Contiguous axial images were obtained from the base of the skull through the vertex without contrast. COMPARISON:  None FINDINGS: Mildly ill-defined low density involving the right frontal-temporal lobe. This area measures up 3.7 cm in the AP dimension. There is a small amount of sulcal effacement surrounding this hypodensity. There is no evidence for midline shift or hydronephrosis. No evidence for acute hemorrhage. There is a hyperdense MCA sign in the right sylvian fissure. Visualized mastoid air cells are clear. Minimal mucosal disease in the left maxillary sinus. Otherwise, the visualized paranasal sinuses are clear. No calvarial fracture. Focal low-density structure in the left parietal-occipital region could represent an old insult or infarct. IMPRESSION: There is a large low-density area occupying the right frontal and temporal lobes suggestive for an infarct. There is thrombus in the right MCA at the right sylvian fissure and a small amount of sulci effacement around the infarct. Findings are suggestive for early subacute infarct in the right MCA territory. No evidence for acute hemorrhage or hemorrhagic transformation. These results were called by telephone at the time of interpretation on 06/20/2015 at 12:25 pm to the patient's nurse, Vicente Males, who verbally acknowledged these results. Electronically Signed   By: Markus Daft M.D.   On: 06/20/2015 12:33   Mr Jodene Nam Head Wo Contrast  06/20/2015  CLINICAL DATA:  Sudden onset of dizziness and increased confusion. Left-sided facial droop. Personal history of end-stage renal disease, hypertension, diabetes. The examination had to be discontinued prior to completion due to patient refusal low tear the holes left. EXAM: MRI HEAD WITHOUT CONTRAST MRA HEAD WITHOUT CONTRAST TECHNIQUE:  Multiplanar, multiecho pulse sequences of the brain and surrounding structures were obtained without intravenous contrast. Angiographic images of the head were obtained using MRA technique without contrast. COMPARISON:  CT head without contrast from the same day. FINDINGS: MRI  HEAD FINDINGS Acute nonhemorrhagic infarct is evident within the right frontal operculum, insular cortex, right lentiform nucleus, and superior temporal gyrus. There is associated T2 signal change and mass effect with partial effacement of the right lateral ventricle. Midline shift of 1-2 mm is noted. Mild periventricular white matter changes are noted bilaterally. There is focal T2 signal abnormality within the left coronal radiata. There well-formed cystic lesion is present in the left occipital lobe. No blood products are present. The internal auditory canals are within normal limits bilaterally. The brainstem is within normal limits. Remote lacunar infarcts are present in the cerebellum bilaterally. Flow is present in the major intracranial arteries. Bilateral lens replacements are present. The paranasal sinuses are clear. The globes and orbits are otherwise intact. The skullbase is within normal limits. Midline sagittal images are within normal limits. MRA HEAD FINDINGS There is marked attenuation of several M3 branch vessels on the right associated with the infarct. No proximal stenosis or occlusion is present. The left MCA branch vessels are intact. ACA branch vessels are normal bilaterally. The right vertebral artery is the dominant vessel. The right PICA origin is visualized and normal. AICA vessels are noted bilaterally. The basilar artery is normal. Both posterior cerebral arteries originate from the basilar tip. The PCA branch vessels are intact. IMPRESSION: 1. Acute nonhemorrhagic infarct involving the right frontal operculum, insular cortex, right lentiform nucleus, and superior temporal gyrus. 2. Marked attenuation multiple M3  branch vessels associated with the infarct. 3. Focal mass effect with 1 - 2 mm midline shift and partial effacement of the right lateral ventricle. Mass effect is somewhat atypical. No discrete mass lesion is present. Follow-up MRI in 1-2 months is recommended to assure no underlying mass lesion and expected evolution of the infarct. 4. Age advanced periventricular white matter disease reflects chronic microvascular ischemia. 5. Focal age advanced white matter changes within the left corona radiata. 6. Remote lacunar infarcts in the cerebellum bilaterally. Electronically Signed   By: San Morelle M.D.   On: 06/20/2015 17:01   Mr Brain Wo Contrast  06/20/2015  CLINICAL DATA:  Sudden onset of dizziness and increased confusion. Left-sided facial droop. Personal history of end-stage renal disease, hypertension, diabetes. The examination had to be discontinued prior to completion due to patient refusal low tear the holes left. EXAM: MRI HEAD WITHOUT CONTRAST MRA HEAD WITHOUT CONTRAST TECHNIQUE: Multiplanar, multiecho pulse sequences of the brain and surrounding structures were obtained without intravenous contrast. Angiographic images of the head were obtained using MRA technique without contrast. COMPARISON:  CT head without contrast from the same day. FINDINGS: MRI HEAD FINDINGS Acute nonhemorrhagic infarct is evident within the right frontal operculum, insular cortex, right lentiform nucleus, and superior temporal gyrus. There is associated T2 signal change and mass effect with partial effacement of the right lateral ventricle. Midline shift of 1-2 mm is noted. Mild periventricular white matter changes are noted bilaterally. There is focal T2 signal abnormality within the left coronal radiata. There well-formed cystic lesion is present in the left occipital lobe. No blood products are present. The internal auditory canals are within normal limits bilaterally. The brainstem is within normal limits. Remote  lacunar infarcts are present in the cerebellum bilaterally. Flow is present in the major intracranial arteries. Bilateral lens replacements are present. The paranasal sinuses are clear. The globes and orbits are otherwise intact. The skullbase is within normal limits. Midline sagittal images are within normal limits. MRA HEAD FINDINGS There is marked attenuation of several M3 branch vessels on  the right associated with the infarct. No proximal stenosis or occlusion is present. The left MCA branch vessels are intact. ACA branch vessels are normal bilaterally. The right vertebral artery is the dominant vessel. The right PICA origin is visualized and normal. AICA vessels are noted bilaterally. The basilar artery is normal. Both posterior cerebral arteries originate from the basilar tip. The PCA branch vessels are intact. IMPRESSION: 1. Acute nonhemorrhagic infarct involving the right frontal operculum, insular cortex, right lentiform nucleus, and superior temporal gyrus. 2. Marked attenuation multiple M3 branch vessels associated with the infarct. 3. Focal mass effect with 1 - 2 mm midline shift and partial effacement of the right lateral ventricle. Mass effect is somewhat atypical. No discrete mass lesion is present. Follow-up MRI in 1-2 months is recommended to assure no underlying mass lesion and expected evolution of the infarct. 4. Age advanced periventricular white matter disease reflects chronic microvascular ischemia. 5. Focal age advanced white matter changes within the left corona radiata. 6. Remote lacunar infarcts in the cerebellum bilaterally. Electronically Signed   By: San Morelle M.D.   On: 06/20/2015 17:01   Nm Pulmonary Perf And Vent  06/16/2015  CLINICAL DATA:  Right-sided chest pain and shortness of breath. EXAM: NUCLEAR MEDICINE VENTILATION - PERFUSION LUNG SCAN TECHNIQUE: Ventilation images were obtained in multiple projections using inhaled aerosol Tc-71m DTPA. Perfusion images were  obtained in multiple projections after intravenous injection of Tc-67m MAA. RADIOPHARMACEUTICALS:  30 millicuries [id number] DTPA aerosol inhalation and 4.1 Technetium-48m MAA IV COMPARISON:  None. FINDINGS: Ventilation: No focal ventilation defect. The ventilated portion of the right lung is smaller than the left, corresponding with the chest radiograph abnormality. There is generalized decreased ventilation to the right midlung. Perfusion: No wedge shaped peripheral perfusion defects to suggest acute pulmonary embolism. IMPRESSION: Low probability for acute pulmonary embolus. Electronically Signed   By: Kerby Moors M.D.   On: 06/16/2015 15:16   Dg Chest Port 1 View  06/19/2015  CLINICAL DATA:  Status post dialysis catheter placement today. Initial encounter. EXAM: PORTABLE CHEST 1 VIEW COMPARISON:  PA and lateral chest 06/15/2015. FINDINGS: Right IJ approach dialysis catheter is in place with the tip projecting within the right atrium. No pneumothorax. Right effusion basilar airspace disease have markedly improved. The left lung is clear. There is cardiomegaly. IMPRESSION: Tip of dialysis catheter projects in the right atrium. Negative for pneumothorax. Improved right pleural effusion and basilar airspace disease. Electronically Signed   By: Inge Rise M.D.   On: 06/19/2015 15:02   Dg Fluoro Guide Cv Line-no Report  06/19/2015  CLINICAL DATA:  FLOURO GUIDE CV LINE Fluoroscopy was utilized by the requesting physician.  No radiographic interpretation.    Microbiology: No results found for this or any previous visit (from the past 240 hour(s)).   Labs: Basic Metabolic Panel:  Recent Labs Lab 06/23/15 0234 06/23/15 1356 06/24/15 0543 06/25/15 0707 06/26/15 0724  NA 136 137 136 136 138  K 5.1 3.8 4.4 4.8 5.0  CL 102 100* 98* 98* 100*  CO2 22 24 24 22  21*  GLUCOSE 106* 161* 104* 97 103*  BUN 92* 52* 65* 80* 99*  CREATININE 19.16* 12.62* 15.17* 18.04* 20.25*  CALCIUM 8.0* 8.2*  8.3* 8.3* 8.1*  PHOS 8.1* 5.0* 7.6* 8.6* 8.7*   Liver Function Tests:  Recent Labs Lab 06/20/15 0307  06/23/15 0234 06/23/15 1356 06/24/15 0543 06/25/15 0707 06/26/15 0724  AST 34  --   --   --   --   --   --  ALT 31  --   --   --   --   --   --   ALKPHOS 57  --   --   --   --   --   --   BILITOT 0.4  --   --   --   --   --   --   PROT 6.5  --   --   --   --   --   --   ALBUMIN 2.7*  < > 2.4* 2.5* 2.4* 2.5* 2.4*  < > = values in this interval not displayed. No results for input(s): LIPASE, AMYLASE in the last 168 hours. No results for input(s): AMMONIA in the last 168 hours. CBC:  Recent Labs Lab 06/21/15 0241 06/22/15 0327 06/23/15 0234 06/23/15 1356 06/26/15 1025  WBC 13.0* 12.5* 13.3* 12.6* 13.2*  HGB 8.5* 8.4* 7.5* 8.9* 8.3*  HCT 25.6* 25.1* 22.3* 26.6* 24.5*  MCV 90.5 90.3 91.0 90.2 91.8  PLT 163 175 159 162 231   Cardiac Enzymes: No results for input(s): CKTOTAL, CKMB, CKMBINDEX, TROPONINI in the last 168 hours. BNP: BNP (last 3 results) No results for input(s): BNP in the last 8760 hours.  ProBNP (last 3 results) No results for input(s): PROBNP in the last 8760 hours.  CBG:  Recent Labs Lab 06/25/15 1608 06/25/15 2010 06/26/15 0035 06/26/15 0413 06/26/15 1249  GLUCAP 102* 125* 108* 113* 89       Signed:  Ilario Dhaliwal MD   [facility] 06/26/2015, 5:02 PM

## 2015-06-26 NOTE — Clinical Social Work Note (Signed)
Clinical Social Work Assessment  Patient Details  Name: George [name] MRN: [id number] Date of Birth: [birth year]  Date of referral:  06/26/15               Reason for consult:  Transportation                Permission sought to share information with:  Family Supports Permission granted to share information::  Yes, Verbal Permission Granted  Name::      [name] )  Agency::   (No)  Relationship::     Contact Information:     Housing/Transportation Living arrangements for the past 2 months:    Source of Information:  Patient, Spouse Patient Interpreter Needed:  None Criminal Activity/Legal Involvement Pertinent to Current Situation/Hospitalization:  No - Comment as needed Significant Relationships:  Spouse, Other Family Members, Parents Lives with:  Parents Do you feel safe going back to the place where you live?  Yes Need for family participation in patient care:  No (Coment)  Care giving concerns:  Pt's girlfriend has concerns with transportation to and from dialysis center. Pt's girlfriend states that she lives in [place] and Pt may move up there in order to get assistance with transportation and care.    Social Worker assessment / plan:  CSW spoke with the Pt at length about transportation concerns to and from dialysis center for HD. Pt has been non compliant in the past and stated that he was having difficulty getting to and from dialysis. CSW explained the importance of dialysis and Pt voiced understanding. CSW questioned Pt to see if he may have an alternate plan until he can set up Medicaid transport. Pt stated that he will ask if his parents or friends would help him to get there. CSW also explained to Pt that he could pay for a cab or uber. Pt also voiced understanding. Pt reassured CSW that he would do everything in his power to get to his appointments. CSW gave the contact number and address for the transportation program and explained how the transportation worked. Pt has a  general knowledge of the program and will begin setting up transportation before he leaves the hospital.    Employment status:  Disabled (Comment on whether or not currently receiving Disability) Insurance information:  Medicare, Medicaid In [place] PT Recommendations:  Not assessed at this time Information / Referral to community resources:  Other (Comment Required) (Medicaid Transportation in [place])  Patient/Family's Response to care:  Pt was thankful for information and will contact program for assistance getting to his medical appointments and dialysis.   Patient/Family's Understanding of and Emotional Response to Diagnosis, Current Treatment, and Prognosis:  Pt has a understanding of the seriousness of his disease and wants to continue his treatment to preserve his health.   Emotional Assessment Appearance:  Appears older than stated age Attitude/Demeanor/Rapport:    Affect (typically observed):  Accepting, Adaptable (Tired ) Orientation:  Oriented to Self, Oriented to Place, Oriented to  Time, Oriented to Situation Alcohol / Substance use:  Tobacco Use, Illicit Drugs (Marijuana) Psych involvement (Current and /or in the community):  No (Comment)  Discharge Needs  Concerns to be addressed:  Compliance Issues Concerns, Adjustment to Illness, Other (Comment Required (Transportation to Dialysis Center ) Readmission within the last 30 days:  No Current discharge risk:  Chronically ill Barriers to Discharge:  Barriers Resolved   Pete Pelt 06/26/2015, 5:44 PM

## 2015-06-27 DIAGNOSIS — K920 Hematemesis: Secondary | ICD-10-CM

## 2015-06-27 DIAGNOSIS — R110 Nausea: Secondary | ICD-10-CM

## 2015-06-27 DIAGNOSIS — I63411 Cerebral infarction due to embolism of right middle cerebral artery: Secondary | ICD-10-CM

## 2015-06-27 DIAGNOSIS — Z992 Dependence on renal dialysis: Secondary | ICD-10-CM

## 2015-06-27 LAB — RENAL FUNCTION PANEL
Albumin: 2.3 g/dL — ABNORMAL LOW (ref 3.5–5.0)
Anion gap: 13 (ref 5–15)
BUN: 52 mg/dL — ABNORMAL HIGH (ref 6–20)
CHLORIDE: 98 mmol/L — AB (ref 101–111)
CO2: 25 mmol/L (ref 22–32)
Calcium: 8 mg/dL — ABNORMAL LOW (ref 8.9–10.3)
Creatinine, Ser: 12.78 mg/dL — ABNORMAL HIGH (ref 0.61–1.24)
GFR, EST AFRICAN AMERICAN: 5 mL/min — AB (ref >60)
GFR, EST NON AFRICAN AMERICAN: 4 mL/min — AB (ref >60)
Glucose, Bld: 94 mg/dL (ref 65–99)
PHOSPHORUS: 6.5 mg/dL — AB (ref 2.5–4.6)
POTASSIUM: 4.1 mmol/L (ref 3.5–5.1)
Sodium: 136 mmol/L (ref 135–145)

## 2015-06-27 LAB — GLUCOSE, CAPILLARY
GLUCOSE-CAPILLARY: 75 mg/dL (ref 65–99)
Glucose-Capillary: 118 mg/dL — ABNORMAL HIGH (ref 65–99)
Glucose-Capillary: 150 mg/dL — ABNORMAL HIGH (ref 65–99)
Glucose-Capillary: 121 mg/dL — ABNORMAL HIGH (ref 65–99)

## 2015-06-27 MED ORDER — ALTEPLASE 2 MG IJ SOLR: 2.0000 mg | Freq: Once | INTRAMUSCULAR | Status: DC | PRN

## 2015-06-27 MED ORDER — HEPARIN SODIUM (PORCINE) 1000 UNIT/ML DIALYSIS: 4000.0000 [IU] | INTRAMUSCULAR | Status: DC | PRN

## 2015-06-27 MED ORDER — LIDOCAINE HCL (PF) 1 % IJ SOLN: 5.0000 mL | INTRAMUSCULAR | Status: DC | PRN

## 2015-06-27 MED ORDER — SODIUM CHLORIDE 0.9 % IV SOLN: 100.0000 mL | INTRAVENOUS | Status: DC | PRN

## 2015-06-27 MED ORDER — LIDOCAINE-PRILOCAINE 2.5-2.5 % EX CREA: 1.0000 "application " | TOPICAL_CREAM | CUTANEOUS | Status: DC | PRN

## 2015-06-27 MED ORDER — HEPARIN SODIUM (PORCINE) 1000 UNIT/ML DIALYSIS: 1000.0000 [IU] | INTRAMUSCULAR | Status: DC | PRN

## 2015-06-27 MED ORDER — LIDOCAINE-PRILOCAINE 2.5-2.5 % EX CREA
1.0000 | TOPICAL_CREAM | CUTANEOUS | Status: DC | PRN
Start: 2015-06-27 — End: 2015-06-27

## 2015-06-27 MED ORDER — PENTAFLUOROPROP-TETRAFLUOROETH EX AERO: 1.0000 "application " | INHALATION_SPRAY | CUTANEOUS | Status: DC | PRN

## 2015-06-27 MED ORDER — HEPARIN SODIUM (PORCINE) 1000 UNIT/ML DIALYSIS: 2000.0000 [IU] | INTRAMUSCULAR | Status: DC | PRN

## 2015-06-27 NOTE — Progress Notes (Signed)
I reviewed the patient's records from [facility] in [place], [place], and there was no record of atrial fibrillation.  He was treated for cardiomyopathy with initially chronic diastolic heart failure, but he also had an episode of acute systolic heart failure.  He has had a heart murmur since childhood.  Recommend daily aspirin and high dose statin.  Patient feeling well.    Exam:  VS notable for mild hypertension Gen:  Adult male, seen in HD, NAD CV:  RRR, 3/6 systolic murmur at the LSB and heard throughout the precordium PULM:  CTAB ABD:  Soft, ND/NT MSK:  No LEE, normal tone and bulk  A/P:  No change to discharge summary dictated by Dr. Hosie Poisson yesterday.  Agree with aspirin therapy for secondary stroke prevention.  Please consider increasing to high dose statin as outpatient.

## 2015-06-27 NOTE — Progress Notes (Signed)
06/27/2015 10:27 AM Hemodialysis Outpatient Note; this patient has been accepted at the [facility] dialysis center on a Tuesday, Thursday and Saturday 2nd shift schedule.The patient can begin treatment on Thursday December 29th and will need to arrive at the center at 11:00 AM. Thank you.Gordy Savers

## 2015-06-27 NOTE — Progress Notes (Signed)
OT Cancellation Note  Patient Details Name: George [name] MRN: [id number] DOB: Nov 28, 1970   Cancelled Treatment:    Reason Eval/Treat Not Completed: Patient at procedure or test/ unavailable (HD). Will check back as schedule allows for OT treatment.   Chrys Racer , MS, OTR/L, CLT Pager: [phone]  06/27/2015, 1:43 PM

## 2015-06-27 NOTE — Progress Notes (Signed)
CSW provided patient w/ [employer] application for transportation to dialysis.  CSW signing off.  Percell Locus Nicky Kras LCSWA [phone]

## 2015-06-27 NOTE — Progress Notes (Signed)
Pt is being discharged home. Discharge instructions and future appointment were reviewed with the patient and family. Patient was brought out in a wheelchair

## 2015-09-03 ENCOUNTER — Encounter: Payer: Self-pay | Admitting: Vascular Surgery

## 2015-09-06 ENCOUNTER — Encounter: Payer: Medicare Other | Admitting: Vascular Surgery

## 2015-09-06 ENCOUNTER — Encounter (HOSPITAL_COMMUNITY): Payer: Medicare Other

## 2017-08-08 ENCOUNTER — Other Ambulatory Visit: Payer: Self-pay

## 2017-08-08 ENCOUNTER — Inpatient Hospital Stay (HOSPITAL_COMMUNITY): Payer: Medicare Other

## 2017-08-08 ENCOUNTER — Inpatient Hospital Stay (HOSPITAL_COMMUNITY)
Admission: EM | Admit: 2017-08-08 | Discharge: 2017-08-18 | DRG: 640 | Disposition: A | Discharge status: Home / Self Care | Payer: Medicare Other | Attending: Internal Medicine | Admitting: Internal Medicine

## 2017-08-08 ENCOUNTER — Encounter (HOSPITAL_COMMUNITY): Payer: Self-pay

## 2017-08-08 ENCOUNTER — Emergency Department (HOSPITAL_COMMUNITY): Payer: Medicare Other

## 2017-08-08 DIAGNOSIS — I69154 Hemiplegia and hemiparesis following nontraumatic intracerebral hemorrhage affecting left non-dominant side: Secondary | ICD-10-CM | POA: Diagnosis not present

## 2017-08-08 DIAGNOSIS — N186 End stage renal disease: Secondary | ICD-10-CM | POA: Diagnosis present

## 2017-08-08 DIAGNOSIS — I160 Hypertensive urgency: Secondary | ICD-10-CM | POA: Diagnosis present

## 2017-08-08 DIAGNOSIS — R748 Abnormal levels of other serum enzymes: Secondary | ICD-10-CM | POA: Diagnosis present

## 2017-08-08 DIAGNOSIS — J810 Acute pulmonary edema: Secondary | ICD-10-CM | POA: Diagnosis not present

## 2017-08-08 DIAGNOSIS — I69354 Hemiplegia and hemiparesis following cerebral infarction affecting left non-dominant side: Secondary | ICD-10-CM | POA: Diagnosis not present

## 2017-08-08 DIAGNOSIS — E872 Acidosis: Secondary | ICD-10-CM | POA: Diagnosis present

## 2017-08-08 DIAGNOSIS — R569 Unspecified convulsions: Secondary | ICD-10-CM

## 2017-08-08 DIAGNOSIS — Z888 Allergy status to other drugs, medicaments and biological substances status: Secondary | ICD-10-CM | POA: Diagnosis not present

## 2017-08-08 DIAGNOSIS — I5033 Acute on chronic diastolic (congestive) heart failure: Secondary | ICD-10-CM | POA: Diagnosis present

## 2017-08-08 DIAGNOSIS — I509 Heart failure, unspecified: Secondary | ICD-10-CM | POA: Diagnosis not present

## 2017-08-08 DIAGNOSIS — E1122 Type 2 diabetes mellitus with diabetic chronic kidney disease: Secondary | ICD-10-CM | POA: Diagnosis present

## 2017-08-08 DIAGNOSIS — N2581 Secondary hyperparathyroidism of renal origin: Secondary | ICD-10-CM | POA: Diagnosis present

## 2017-08-08 DIAGNOSIS — R0902 Hypoxemia: Secondary | ICD-10-CM

## 2017-08-08 DIAGNOSIS — D631 Anemia in chronic kidney disease: Secondary | ICD-10-CM | POA: Diagnosis present

## 2017-08-08 DIAGNOSIS — G40909 Epilepsy, unspecified, not intractable, without status epilepticus: Secondary | ICD-10-CM | POA: Diagnosis present

## 2017-08-08 DIAGNOSIS — Z79899 Other long term (current) drug therapy: Secondary | ICD-10-CM

## 2017-08-08 DIAGNOSIS — E875 Hyperkalemia: Secondary | ICD-10-CM

## 2017-08-08 DIAGNOSIS — J9600 Acute respiratory failure, unspecified whether with hypoxia or hypercapnia: Secondary | ICD-10-CM | POA: Diagnosis present

## 2017-08-08 DIAGNOSIS — I132 Hypertensive heart and chronic kidney disease with heart failure and with stage 5 chronic kidney disease, or end stage renal disease: Secondary | ICD-10-CM | POA: Diagnosis present

## 2017-08-08 DIAGNOSIS — E8779 Other fluid overload: Secondary | ICD-10-CM | POA: Diagnosis not present

## 2017-08-08 DIAGNOSIS — E8729 Other acidosis: Secondary | ICD-10-CM | POA: Diagnosis present

## 2017-08-08 DIAGNOSIS — Z992 Dependence on renal dialysis: Secondary | ICD-10-CM | POA: Diagnosis not present

## 2017-08-08 DIAGNOSIS — J9601 Acute respiratory failure with hypoxia: Secondary | ICD-10-CM | POA: Diagnosis present

## 2017-08-08 DIAGNOSIS — E118 Type 2 diabetes mellitus with unspecified complications: Secondary | ICD-10-CM | POA: Diagnosis present

## 2017-08-08 DIAGNOSIS — R9431 Abnormal electrocardiogram [ECG] [EKG]: Secondary | ICD-10-CM | POA: Diagnosis not present

## 2017-08-08 DIAGNOSIS — I10 Essential (primary) hypertension: Secondary | ICD-10-CM | POA: Diagnosis not present

## 2017-08-08 DIAGNOSIS — R7989 Other specified abnormal findings of blood chemistry: Secondary | ICD-10-CM

## 2017-08-08 DIAGNOSIS — D696 Thrombocytopenia, unspecified: Secondary | ICD-10-CM | POA: Diagnosis present

## 2017-08-08 DIAGNOSIS — R778 Other specified abnormalities of plasma proteins: Secondary | ICD-10-CM | POA: Diagnosis present

## 2017-08-08 DIAGNOSIS — Z7982 Long term (current) use of aspirin: Secondary | ICD-10-CM | POA: Diagnosis not present

## 2017-08-08 DIAGNOSIS — Z87891 Personal history of nicotine dependence: Secondary | ICD-10-CM

## 2017-08-08 HISTORY — DX: Unspecified convulsions: R56.9

## 2017-08-08 HISTORY — DX: Heart failure, unspecified: I50.9

## 2017-08-08 LAB — CBC WITH DIFFERENTIAL/PLATELET
Basophils Absolute: 0 10*3/uL (ref 0.0–0.1)
Basophils Relative: 1 %
EOS ABS: 0.2 10*3/uL (ref 0.0–0.7)
Eosinophils Relative: 3 %
HCT: 38.6 % — ABNORMAL LOW (ref 39.0–52.0)
HEMOGLOBIN: 13.2 g/dL (ref 13.0–17.0)
LYMPHS ABS: 1.4 10*3/uL (ref 0.7–4.0)
LYMPHS PCT: 24 %
MCH: 33.3 pg (ref 26.0–34.0)
MCHC: 34.2 g/dL (ref 30.0–36.0)
MCV: 97.5 fL (ref 78.0–100.0)
Monocytes Absolute: 0.5 10*3/uL (ref 0.1–1.0)
Monocytes Relative: 9 %
Neutro Abs: 3.8 10*3/uL (ref 1.7–7.7)
Neutrophils Relative %: 63 %
Platelets: 174 10*3/uL (ref 150–400)
RBC: 3.96 MIL/uL — AB (ref 4.22–5.81)
RDW: 15.4 % (ref 11.5–15.5)
WBC: 5.9 10*3/uL (ref 4.0–10.5)

## 2017-08-08 LAB — BASIC METABOLIC PANEL
Anion gap: 22 — ABNORMAL HIGH (ref 5–15)
Anion gap: 24 — ABNORMAL HIGH (ref 5–15)
BUN: 110 mg/dL — AB (ref 6–20)
BUN: 56 mg/dL — AB (ref 6–20)
CHLORIDE: 100 mmol/L — AB (ref 101–111)
CHLORIDE: 96 mmol/L — AB (ref 101–111)
CO2: 18 mmol/L — ABNORMAL LOW (ref 22–32)
CO2: 19 mmol/L — ABNORMAL LOW (ref 22–32)
CREATININE: 13.03 mg/dL — AB (ref 0.61–1.24)
Calcium: 9.4 mg/dL (ref 8.9–10.3)
Calcium: 8.9 mg/dL (ref 8.9–10.3)
Creatinine, Ser: 20.27 mg/dL — ABNORMAL HIGH (ref 0.61–1.24)
GFR calc Af Amer: 3 mL/min — ABNORMAL LOW (ref >60)
GFR calc Af Amer: 5 mL/min — ABNORMAL LOW (ref >60)
GFR calc non Af Amer: 2 mL/min — ABNORMAL LOW (ref >60)
GFR calc non Af Amer: 4 mL/min — ABNORMAL LOW (ref >60)
Glucose, Bld: 62 mg/dL — ABNORMAL LOW (ref 65–99)
Glucose, Bld: 81 mg/dL (ref 65–99)
Potassium: 4 mmol/L (ref 3.5–5.1)
SODIUM: 140 mmol/L (ref 135–145)
SODIUM: 139 mmol/L (ref 135–145)

## 2017-08-08 LAB — TROPONIN I
TROPONIN I: 0.05 ng/mL — AB (ref <0.03)
TROPONIN I: 0.05 ng/mL — AB (ref <0.03)
Troponin I: 0.06 ng/mL (ref <0.03)
Troponin I: 0.15 ng/mL (ref <0.03)

## 2017-08-08 LAB — HIV ANTIBODY (ROUTINE TESTING W REFLEX): HIV Screen 4th Generation wRfx: NONREACTIVE

## 2017-08-08 LAB — GLUCOSE, CAPILLARY: Glucose-Capillary: 77 mg/dL (ref 65–99)

## 2017-08-08 LAB — CBG MONITORING, ED: GLUCOSE-CAPILLARY: 89 mg/dL (ref 65–99)

## 2017-08-08 LAB — MRSA PCR SCREENING: MRSA BY PCR: NEGATIVE

## 2017-08-08 LAB — POTASSIUM
POTASSIUM: 4 mmol/L (ref 3.5–5.1)
POTASSIUM: 4.3 mmol/L (ref 3.5–5.1)

## 2017-08-08 LAB — PHOSPHORUS: Phosphorus: 7.3 mg/dL — ABNORMAL HIGH (ref 2.5–4.6)

## 2017-08-08 LAB — MAGNESIUM: MAGNESIUM: 2.8 mg/dL — AB (ref 1.7–2.4)

## 2017-08-08 MED ORDER — HYDRALAZINE HCL 20 MG/ML IJ SOLN
INTRAMUSCULAR | Status: AC
  Filled 2017-08-08: qty 1

## 2017-08-08 MED ORDER — LABETALOL HCL 5 MG/ML IV SOLN
10.0000 mg | INTRAVENOUS | Status: DC | PRN
  Filled 2017-08-08: qty 4

## 2017-08-08 MED ORDER — HYDRALAZINE HCL 25 MG PO TABS
100.0000 mg | ORAL_TABLET | Freq: Once | ORAL | Status: AC
  Administered 2017-08-08: 100 mg via ORAL
  Filled 2017-08-08: qty 4

## 2017-08-08 MED ORDER — LIDOCAINE-PRILOCAINE 2.5-2.5 % EX CREA: 1.0000 "application " | TOPICAL_CREAM | CUTANEOUS | Status: DC | PRN

## 2017-08-08 MED ORDER — SODIUM CHLORIDE 0.9% FLUSH
3.0000 mL | Freq: Two times a day (BID) | INTRAVENOUS | Status: DC
  Administered 2017-08-16: 3 mL via INTRAVENOUS

## 2017-08-08 MED ORDER — LABETALOL HCL 5 MG/ML IV SOLN
10.0000 mg | Freq: Once | INTRAVENOUS | Status: AC
  Administered 2017-08-08: 10 mg via INTRAVENOUS

## 2017-08-08 MED ORDER — ALBUTEROL SULFATE (2.5 MG/3ML) 0.083% IN NEBU: 2.5000 mg | INHALATION_SOLUTION | RESPIRATORY_TRACT | Status: DC | PRN

## 2017-08-08 MED ORDER — SODIUM CHLORIDE 0.9% FLUSH: 3.0000 mL | INTRAVENOUS | Status: DC | PRN

## 2017-08-08 MED ORDER — SODIUM CHLORIDE 0.9 % IV SOLN
1000.0000 mg | Freq: Once | INTRAVENOUS | Status: AC
  Administered 2017-08-08: 1000 mg via INTRAVENOUS
  Filled 2017-08-08: qty 10

## 2017-08-08 MED ORDER — DEXTROSE 50 % IV SOLN
1.0000 | Freq: Once | INTRAVENOUS | Status: AC
  Administered 2017-08-08: 50 mL via INTRAVENOUS
  Filled 2017-08-08: qty 50

## 2017-08-08 MED ORDER — ONDANSETRON HCL 4 MG PO TABS: 4.0000 mg | ORAL_TABLET | Freq: Four times a day (QID) | ORAL | Status: DC | PRN

## 2017-08-08 MED ORDER — HEPARIN SODIUM (PORCINE) 1000 UNIT/ML DIALYSIS
1000.0000 [IU] | INTRAMUSCULAR | Status: DC | PRN
  Filled 2017-08-08: qty 1

## 2017-08-08 MED ORDER — LEVETIRACETAM 500 MG PO TABS
500.0000 mg | ORAL_TABLET | Freq: Two times a day (BID) | ORAL | Status: DC
Start: 2017-08-08 — End: 2017-08-08

## 2017-08-08 MED ORDER — ASPIRIN EC 81 MG PO TBEC
81.0000 mg | DELAYED_RELEASE_TABLET | Freq: Every day | ORAL | Status: DC
Start: 2017-08-08 — End: 2017-08-18
  Administered 2017-08-09 – 2017-08-18 (×10): 81 mg via ORAL
  Filled 2017-08-08 (×11): qty 1

## 2017-08-08 MED ORDER — HYDRALAZINE HCL 20 MG/ML IJ SOLN
20.0000 mg | Freq: Once | INTRAMUSCULAR | Status: AC
  Administered 2017-08-08: 20 mg via INTRAVENOUS
  Filled 2017-08-08: qty 1

## 2017-08-08 MED ORDER — ALBUTEROL SULFATE (2.5 MG/3ML) 0.083% IN NEBU
10.0000 mg | INHALATION_SOLUTION | Freq: Once | RESPIRATORY_TRACT | Status: AC
  Administered 2017-08-08: 10 mg via RESPIRATORY_TRACT
  Filled 2017-08-08: qty 12

## 2017-08-08 MED ORDER — HEPARIN SODIUM (PORCINE) 5000 UNIT/ML IJ SOLN
5000.0000 [IU] | Freq: Three times a day (TID) | INTRAMUSCULAR | Status: DC
  Administered 2017-08-08 – 2017-08-10 (×5): 5000 [IU] via SUBCUTANEOUS
  Filled 2017-08-08 (×15): qty 1

## 2017-08-08 MED ORDER — CALCITRIOL 0.25 MCG PO CAPS
0.2500 ug | ORAL_CAPSULE | ORAL | Status: DC
  Administered 2017-08-10 – 2017-08-18 (×3): 0.25 ug via ORAL
  Filled 2017-08-08 (×5): qty 1

## 2017-08-08 MED ORDER — HYDRALAZINE HCL 20 MG/ML IJ SOLN: 10.0000 mg | INTRAMUSCULAR | Status: DC | PRN

## 2017-08-08 MED ORDER — SENNOSIDES-DOCUSATE SODIUM 8.6-50 MG PO TABS
1.0000 | ORAL_TABLET | Freq: Every evening | ORAL | Status: DC | PRN
  Filled 2017-08-08: qty 1

## 2017-08-08 MED ORDER — SODIUM BICARBONATE 8.4 % IV SOLN
50.0000 meq | Freq: Once | INTRAVENOUS | Status: AC
  Administered 2017-08-08: 50 meq via INTRAVENOUS
  Filled 2017-08-08: qty 50

## 2017-08-08 MED ORDER — SODIUM CHLORIDE 0.9% FLUSH
3.0000 mL | Freq: Two times a day (BID) | INTRAVENOUS | Status: DC
  Administered 2017-08-08 – 2017-08-17 (×12): 3 mL via INTRAVENOUS

## 2017-08-08 MED ORDER — SODIUM CHLORIDE 0.9 % IV SOLN: 100.0000 mL | INTRAVENOUS | Status: DC | PRN

## 2017-08-08 MED ORDER — LIDOCAINE HCL (PF) 1 % IJ SOLN: 5.0000 mL | INTRAMUSCULAR | Status: DC | PRN

## 2017-08-08 MED ORDER — CALCIUM GLUCONATE 10 % IV SOLN
1.0000 g | Freq: Once | INTRAVENOUS | Status: AC
  Administered 2017-08-08: 1 g via INTRAVENOUS
  Filled 2017-08-08: qty 10

## 2017-08-08 MED ORDER — CARVEDILOL 25 MG PO TABS
25.0000 mg | ORAL_TABLET | Freq: Two times a day (BID) | ORAL | Status: DC
  Administered 2017-08-08 – 2017-08-18 (×19): 25 mg via ORAL
  Filled 2017-08-08 (×21): qty 1

## 2017-08-08 MED ORDER — ISOSORB DINITRATE-HYDRALAZINE 20-37.5 MG PO TABS
1.0000 | ORAL_TABLET | Freq: Three times a day (TID) | ORAL | Status: DC
  Administered 2017-08-08 – 2017-08-18 (×28): 1 via ORAL
  Filled 2017-08-08 (×30): qty 1

## 2017-08-08 MED ORDER — ACETAMINOPHEN 650 MG RE SUPP: 650.0000 mg | Freq: Four times a day (QID) | RECTAL | Status: DC | PRN

## 2017-08-08 MED ORDER — ONDANSETRON HCL 4 MG/2ML IJ SOLN: 4.0000 mg | Freq: Four times a day (QID) | INTRAMUSCULAR | Status: DC | PRN

## 2017-08-08 MED ORDER — LORAZEPAM 2 MG/ML IJ SOLN: 1.0000 mg | INTRAMUSCULAR | Status: DC | PRN

## 2017-08-08 MED ORDER — ORAL CARE MOUTH RINSE
15.0000 mL | Freq: Two times a day (BID) | OROMUCOSAL | Status: DC
  Administered 2017-08-08 – 2017-08-17 (×6): 15 mL via OROMUCOSAL

## 2017-08-08 MED ORDER — PATIROMER SORBITEX CALCIUM 8.4 G PO PACK
16.8000 g | PACK | Freq: Every day | ORAL | Status: DC
  Filled 2017-08-08 (×2): qty 4

## 2017-08-08 MED ORDER — INSULIN ASPART 100 UNIT/ML ~~LOC~~ SOLN
10.0000 [IU] | Freq: Once | SUBCUTANEOUS | Status: AC
  Administered 2017-08-08: 10 [IU] via INTRAVENOUS
  Filled 2017-08-08: qty 1

## 2017-08-08 MED ORDER — ALTEPLASE 2 MG IJ SOLR
2.0000 mg | Freq: Once | INTRAMUSCULAR | Status: DC | PRN
  Filled 2017-08-08: qty 2

## 2017-08-08 MED ORDER — HYDROCODONE-ACETAMINOPHEN 5-325 MG PO TABS: 1.0000 | ORAL_TABLET | ORAL | Status: DC | PRN

## 2017-08-08 MED ORDER — HEPARIN SODIUM (PORCINE) 1000 UNIT/ML DIALYSIS
5000.0000 [IU] | INTRAMUSCULAR | Status: DC | PRN
  Filled 2017-08-08: qty 5

## 2017-08-08 MED ORDER — AMLODIPINE BESYLATE 5 MG PO TABS
5.0000 mg | ORAL_TABLET | Freq: Every day | ORAL | Status: DC
  Administered 2017-08-09 – 2017-08-18 (×10): 5 mg via ORAL
  Filled 2017-08-08 (×10): qty 1

## 2017-08-08 MED ORDER — PENTAFLUOROPROP-TETRAFLUOROETH EX AERO: 1.0000 "application " | INHALATION_SPRAY | CUTANEOUS | Status: DC | PRN

## 2017-08-08 MED ORDER — ACETAMINOPHEN 325 MG PO TABS
650.0000 mg | ORAL_TABLET | Freq: Four times a day (QID) | ORAL | Status: DC | PRN
  Administered 2017-08-12: 650 mg via ORAL
  Filled 2017-08-08 (×2): qty 2

## 2017-08-08 MED ORDER — PRAVASTATIN SODIUM 20 MG PO TABS
20.0000 mg | ORAL_TABLET | Freq: Every day | ORAL | Status: DC
  Administered 2017-08-08 – 2017-08-18 (×11): 20 mg via ORAL
  Filled 2017-08-08 (×11): qty 1

## 2017-08-08 MED ORDER — SODIUM CHLORIDE 0.9 % IV SOLN: 250.0000 mL | INTRAVENOUS | Status: DC | PRN

## 2017-08-08 MED ORDER — SODIUM CHLORIDE 0.9 % IV SOLN
500.0000 mg | Freq: Two times a day (BID) | INTRAVENOUS | Status: DC
  Administered 2017-08-08 – 2017-08-10 (×4): 500 mg via INTRAVENOUS
  Filled 2017-08-08 (×5): qty 5

## 2017-08-08 NOTE — Progress Notes (Addendum)
[facility] hospitalist update note  Reviewed the H&P written by Dr. Myna Hidalgo , with current plan of care.  Arrived at bedside after rapid response called in HD unit. Hx given by bedside nurses. Pt had finished 3/4 of HD and then said he felt like he would have a seizure. He then had a full tonic clonic seizure for 90s approx. Pt stopped on his own. Pt with hypoxia at bedside and placed on NRB. Minimally responsive. Severe range BP noted at bedside. BS was checked and was found to be normal at 86. Pt given labetalol and hydralazine for BP.  O: VS reviewed, post ictal, PERRLA, EOMI, tongue bite, snoring respirations, tachycardia, bruit heard, GCS 13, moving all extrs  A/P  Seizure - seizure precautions - IV keppra BID, plus loading dose - moved to ICU on SDU status - IV ativan prn  Hypoxia - likely due to seizure, pt improving - will check CXR  Hyperkalemia - recheck K+ - recheck EKG - if pt better will send back for HD after loading of keppra and observation late in day if needed  CRITICAL CARE Performed by: Elwin Mocha   Total critical care time: 45 minutes  Critical care time was exclusive of separately billable procedures and treating other patients.  Critical care was necessary to treat or prevent imminent or life-threatening deterioration.  Critical care was time spent personally by me on the following activities: development of treatment plan with patient and/or surrogate as well as nursing, discussions with consultants, evaluation of patient's response to treatment, examination of patient, obtaining history from patient or surrogate, ordering and performing treatments and interventions, ordering and review of laboratory studies, ordering and review of radiographic studies, pulse oximetry and re-evaluation of patient's condition.   Elwin Mocha MD  Addendum:  Martin Majestic to see patient at bedside.  GCS 13.  Work of breathing decreased.  Patient still with oxygen requirements of  4 L by nasal cannula.  Has been off nonrebreather for over 2 hours.  Chest x-ray shows residual fluid in the lungs.  EKG has improved with as needed available closer to normal but still with first-degree AV block.  Also with prolonged QT.  Ultralights are in the normal range.  We will check an echo. Pt has been loaded with Keppra. SDU->Tele. Spoke to Dr. Posey Pronto with [facility] who will speak to the dialysis unit to try to get patient dialyzed this evening to assist with pulmonary edema.  Elwin Mocha, MD

## 2017-08-08 NOTE — ED Provider Notes (Signed)
[facility] EMERGENCY DEPARTMENT Provider Note   CSN: [id number] Arrival date & time: 08/08/17  0111     History   Chief Complaint Chief Complaint  Patient presents with  . Shortness of Breath    HPI George [name] is a 47 y.o. male.  She presents to the emergency department for evaluation of shortness of breath.  Patient has a history of end-stage renal disease, on dialysis Monday Wednesday Friday.  He was living in [place], but became homeless and therefore came to [place] to live with family.  He has been trying to get into a dialysis center but has not been able to.  He has not had dialysis in 1-1/2 weeks.  He is experiencing severe shortness of breath, both at rest and worsening with exertion.  No chest pain.      Past Medical History:  Diagnosis Date  . CHF (congestive heart failure) ([place])   . Diabetes mellitus without complication ([place])   . Hypertension   . Renal disorder     Patient Active Problem List   Diagnosis Date Noted  . ESRD (end stage renal disease) ([place])   . Type 2 diabetes mellitus with complication ([place])   . Absolute anemia   . Tobacco abuse   . Hemiparesis affecting left side as late effect of stroke ([place])   . Cerebral infarction due to embolism of right middle cerebral artery ([place])   . Cerebral embolism with cerebral infarction 06/22/2015  . Confusion   . ESRD (end stage renal disease) on dialysis ([place])   . Right leg pain   . Gastrointestinal hemorrhage with melena   . Hematemesis with nausea   . Hematemesis 06/17/2015  . Uremia 06/17/2015  . Hypoxia 06/16/2015  . Acute respiratory failure ([place]) 06/16/2015  . ESRD on peritoneal dialysis ([place]) 06/16/2015  . Diabetes mellitus without complication ([place]) 41/66/0630  . Essential hypertension 06/16/2015  . Pleuritic chest pain 06/16/2015  . Pain in the chest   . Hyperkalemia   . High anion gap metabolic acidosis   . SOB (shortness of breath)     Past Surgical History:   Procedure Laterality Date  . AV FISTULA PLACEMENT Left 06/19/2015   Procedure: LEFT ARTERIOVENOUS (AV) FISTULA CREATION;  Surgeon: Elam Dutch, MD;  Location: [facility];  Service: Vascular;  Laterality: Left;  . CAPD REMOVAL N/A 06/22/2015   Procedure: CONTINUOUS AMBULATORY PERITONEAL DIALYSIS  (CAPD) CATHETER REMOVAL;  Surgeon: Ralene Ok, MD;  Location: [facility];  Service: General;  Laterality: N/A;  . ESOPHAGOGASTRODUODENOSCOPY N/A 06/21/2015   Procedure: ESOPHAGOGASTRODUODENOSCOPY (EGD);  Surgeon: Laurence Spates, MD;  Location: [facility] ENDOSCOPY;  Service: Endoscopy;  Laterality: N/A;  . INSERTION OF DIALYSIS CATHETER Left 06/19/2015   Procedure: INSERTION OF DIALYSIS CATHETER LEFT INTERNAL JUGULAR;  Surgeon: Elam Dutch, MD;  Location: [facility];  Service: Vascular;  Laterality: Left;  . peritoneal dialysis catheter placed     around 2015 in [facility] Medications    Prior to Admission medications   Medication Sig Start Date End Date Taking? Authorizing Provider  amLODipine (NORVASC) 5 MG tablet Take 5 mg by mouth daily.   Yes [provider]  aspirin EC 81 MG tablet Take 81 mg by mouth daily.   Yes [provider]  calcitRIOL (ROCALTROL) 0.25 MCG capsule Take 1 capsule (0.25 mcg total) by mouth Every Tuesday,Thursday,and Saturday with dialysis. Patient taking differently: Take 0.25 mcg by mouth every Monday, Wednesday, and Friday.  06/26/15  Yes Hosie Poisson, MD  carvedilol (COREG) 25 MG tablet Take 25 mg by mouth 2 (two) times daily with a meal.   Yes [provider]  isosorbide-hydrALAZINE (BIDIL) 20-37.5 MG tablet Take 1 tablet by mouth 3 (three) times daily.   Yes [provider]  levETIRAcetam (KEPPRA) 500 MG tablet Take 500 mg by mouth 2 (two) times daily.   Yes [provider]  losartan (COZAAR) 50 MG tablet Take 50 mg by mouth daily.   Yes [provider]  pravastatin (PRAVACHOL) 20 MG tablet Take 20 mg by  mouth every evening.   Yes [provider]  aspirin EC 325 MG EC tablet Take 1 tablet (325 mg total) by mouth daily. Patient not taking: Reported on 08/08/2017 06/26/15   Hosie Poisson, MD  calcium acetate (PHOSLO) 667 MG capsule Take 1 capsule (667 mg total) by mouth 3 (three) times daily with meals. Patient not taking: Reported on 08/08/2017 06/26/15   Hosie Poisson, MD  calcium carbonate, dosed in mg elemental calcium, 1250 (500 Ca) MG/5ML Take 5 mLs (500 mg of elemental calcium total) by mouth every 6 (six) hours as needed for indigestion. Patient not taking: Reported on 08/08/2017 06/26/15   Hosie Poisson, MD  Darbepoetin Alfa (ARANESP) 200 MCG/0.4ML SOSY injection Inject 0.4 mLs (200 mcg total) into the vein every Saturday with hemodialysis. Patient not taking: Reported on 08/08/2017 06/26/15   Hosie Poisson, MD  Nutritional Supplements (FEEDING SUPPLEMENT, NEPRO CARB STEADY,) LIQD Take 237 mLs by mouth 3 (three) times daily as needed (Supplement). Patient not taking: Reported on 08/08/2017 06/26/15   Hosie Poisson, MD  Nutritional Supplements (FEEDING SUPPLEMENT, NEPRO CARB STEADY,) LIQD Take 237 mLs by mouth 2 (two) times daily between meals. Patient not taking: Reported on 08/08/2017 06/26/15   Hosie Poisson, MD  pantoprazole (PROTONIX) 40 MG tablet Take 1 tablet (40 mg total) by mouth 2 (two) times daily. Patient not taking: Reported on 08/08/2017 06/26/15   Hosie Poisson, MD    Family History History reviewed. No pertinent family history.  Social History Social History   Tobacco Use  . Smoking status: Former Smoker    Types: Cigarettes  Substance Use Topics  . Alcohol use: No  . Drug use: Yes    Types: Marijuana     Allergies   Lisinopril   Review of Systems Review of Systems  Respiratory: Positive for shortness of breath.   Cardiovascular: Positive for leg swelling.  All other systems reviewed and are negative.    Physical Exam Updated Vital Signs BP (!) 202/137    Pulse 81   Temp 97.6 F (36.4 C) (Axillary)   Resp (!) 29   SpO2 (!) 88%   Physical Exam  Constitutional: He is oriented to person, place, and time. He appears well-developed and well-nourished. No distress.  HENT:  Head: Normocephalic and atraumatic.  Right Ear: Hearing normal.  Left Ear: Hearing normal.  Nose: Nose normal.  Mouth/Throat: Oropharynx is clear and moist and mucous membranes are normal.  Eyes: Conjunctivae and EOM are normal. Pupils are equal, round, and reactive to light.  Neck: Normal range of motion. Neck supple.  Cardiovascular: Regular rhythm, S1 normal and S2 normal. Exam reveals no gallop and no friction rub.  No murmur heard. Pulmonary/Chest: Effort normal. No respiratory distress. He has rales. He exhibits no tenderness.  Abdominal: Soft. Normal appearance and bowel sounds are normal. There is no hepatosplenomegaly. There is no tenderness. There is no rebound, no guarding, no  tenderness at McBurney's point and negative Murphy's sign. No hernia.  Musculoskeletal: Normal range of motion.       Right lower leg: He exhibits edema.       Left lower leg: He exhibits edema.  Neurological: He is alert and oriented to person, place, and time. He has normal strength. No cranial nerve deficit or sensory deficit. Coordination normal. GCS eye subscore is 4. GCS verbal subscore is 5. GCS motor subscore is 6.  Skin: Skin is warm, dry and intact. No rash noted. No cyanosis.  Psychiatric: He has a normal mood and affect. His speech is normal and behavior is normal. Thought content normal.  Nursing note and vitals reviewed.    ED Treatments / Results  Labs (all labs ordered are listed, but only abnormal results are displayed) Labs Reviewed  CBC WITH DIFFERENTIAL/PLATELET - Abnormal; Notable for the following components:      Result Value   RBC 3.96 (*)    HCT 38.6 (*)    All other components within normal limits  BASIC METABOLIC PANEL - Abnormal; Notable for the  following components:   Potassium >7.5 (*)    Chloride 100 (*)    CO2 18 (*)    Glucose, Bld 62 (*)    BUN 110 (*)    Creatinine, Ser 20.27 (*)    GFR calc non Af Amer 2 (*)    GFR calc Af Amer 3 (*)    Anion gap 22 (*)    All other components within normal limits  TROPONIN I - Abnormal; Notable for the following components:   Troponin I 0.05 (*)    All other components within normal limits    EKG  EKG Interpretation  Date/Time:  Saturday August 08 2017 01:23:07 EST Ventricular Rate:  86 PR Interval:    QRS Duration: 127 QT Interval:  422 QTC Calculation: 505 R Axis:   -105 Text Interpretation:  Sinus rhythm Prolonged PR interval Nonspecific IVCD with LAD Confirmed by Orpah Greek [id number]) on 08/08/2017 1:32:48 AM       Radiology Dg Chest 2 View  Result Date: 08/08/2017 CLINICAL DATA:  Shortness of breath EXAM: CHEST  2 VIEW COMPARISON:  06/19/2015 FINDINGS: Moderate cardiomegaly with vascular congestion and diffuse interstitial opacity consistent with pulmonary edema. Small right-sided pleural effusion with more focal airspace disease in the right mid to lower lung. Aortic atherosclerosis. No pneumothorax. IMPRESSION: 1. Moderate cardiomegaly with vascular congestion and mild interstitial edema 2. Small right pleural effusion. More focal airspace disease in the right mid to lower lung may reflect superimposed pneumonia Electronically Signed   By: Donavan Foil M.D.   On: 08/08/2017 02:21    Procedures Procedures (including critical care time)  Medications Ordered in ED Medications  patiromer Daryll Drown) packet 16.8 g (not administered)  calcium gluconate inj 10% (1 g) URGENT USE ONLY! (not administered)  albuterol (PROVENTIL) (2.5 MG/3ML) 0.083% nebulizer solution 10 mg (not administered)  insulin aspart (novoLOG) injection 10 Units (not administered)  dextrose 50 % solution 50 mL (not administered)  hydrALAZINE (APRESOLINE) injection 20 mg (20 mg Intravenous  Given 08/08/17 0254)  hydrALAZINE (APRESOLINE) tablet 100 mg (100 mg Oral Given 08/08/17 0254)     Initial Impression / Assessment and Plan / ED Course  I have reviewed the triage vital signs and the nursing notes.  Pertinent labs & imaging results that were available during my care of the patient were reviewed by me and considered in my medical decision making (see  chart for details).     Patient presents to the emergency department for evaluation of shortness of breath.  Patient has a history of end-stage renal disease, on dialysis.  He is normally dialyzed on Monday Wednesday and Friday, but has not been dialyzed in 1-1/2 weeks.  Patient was previously living in [place], became homeless and has recently moved to [place] to live with family.  He has been unable to find a dialysis center.  Patient's workup reveals significant volume overload.  He has an oxygen demand currently, requiring 4 L nasal cannula to keep his oxygen saturation above 90%.  He is not normally on oxygen.  He is hypertensive, was treated with IV and p.o. hydralazine which he takes normally.  Patient is found to be hyperkalemic without significant EKG changes.  He was aggressively treated for hyperkalemia.  Discussed with Dr. Posey Pronto, on-call for nephrology.  Patient will be dialyzed urgently, requires hospitalization.  CRITICAL CARE Performed by: Orpah Greek   Total critical care time: 30 minutes  Critical care time was exclusive of separately billable procedures and treating other patients.  Critical care was necessary to treat or prevent imminent or life-threatening deterioration.  Critical care was time spent personally by me on the following activities: development of treatment plan with patient and/or surrogate as well as nursing, discussions with consultants, evaluation of patient's response to treatment, examination of patient, obtaining history from patient or surrogate, ordering and performing  treatments and interventions, ordering and review of laboratory studies, ordering and review of radiographic studies, pulse oximetry and re-evaluation of patient's condition.   Final Clinical Impressions(s) / ED Diagnoses   Final diagnoses:  Acute pulmonary edema (HCC)  ESRD (end stage renal disease) on dialysis [facility])  Hyperkalemia    ED Discharge Orders    None       Orpah Greek, MD 08/08/17 [phone]

## 2017-08-08 NOTE — ED Notes (Signed)
ED Provider at bedside. 

## 2017-08-08 NOTE — Procedures (Signed)
Patient seen on Hemodialysis. QB 350, UF goal 5L Treatment adjusted as needed.  Elmarie Shiley MD [facility]. Office # [phone] Pager # [phone] 7:30 AM

## 2017-08-08 NOTE — ED Notes (Signed)
EDP made aware of critical lab results-Monique,RN

## 2017-08-08 NOTE — H&P (Signed)
History and Physical    George [name] WNI:[id number] DOB: May 16, 1971 DOA: 08/08/2017  PCP: No primary care provider on file.   Patient coming from: Home  Chief Complaint: SOB   HPI: George [name] is a 47 y.o. male with medical history significant for end-stage renal disease and hypertension, now presenting to the emergency department for evaluation of severe shortness of breath.  Patient reports moving out of state couple years ago, has had some difficulties there and became homeless, now recently back in the area to stay with family.  Reports that he is not yet established with a local nephrologist and has gone 1.5 weeks without dialysis.  He reports severe dyspnea, even at rest now.  He denies chest pain or palpitations.  Denies recent fevers or chills.  Cough is nonproductive.  ED Course: Upon arrival to the ED, patient is found to be afebrile, saturating 87% on 4 L/min of supplemental oxygen, tachypneic, and hypertensive to 202/137.  EKG features a sinus rhythm with first-degree AV nodal block and nonspecific IVCD.  Chest x-ray features of moderate cardiomegaly and pulmonary vascular congestion with mild interstitial edema and small right pleural effusion.  Chemistry panel is notable for a serum potassium of ">7.5," bicarbonate of 18, anion gap 22, BUN 110, glucose of 62.  CBC is unremarkable.  Troponin is slightly elevated to 0.05.  Patient was treated with IV calcium, albuterol neb, dextrose with IV insulin, patiromer, and hydralazine.  Nephrology has been consulted by the ED physician and is arranging for urgent dialysis.  Patient will be admitted to the stepdown unit for ongoing evaluation and management of critical hyperkalemia, pulmonary edema with acute hypoxic respiratory failure, mild acidosis, and hypertensive urgency, all suspected secondary to nonadherence with dialysis schedule.  Review of Systems:  All other systems reviewed and apart from HPI, are negative.  Past Medical History:    Diagnosis Date  . CHF (congestive heart failure) ([place])   . Diabetes mellitus without complication ([place])   . Hypertension   . Renal disorder     Past Surgical History:  Procedure Laterality Date  . AV FISTULA PLACEMENT Left 06/19/2015   Procedure: LEFT ARTERIOVENOUS (AV) FISTULA CREATION;  Surgeon: Elam Dutch, MD;  Location: [place];  Service: Vascular;  Laterality: Left;  . CAPD REMOVAL N/A 06/22/2015   Procedure: CONTINUOUS AMBULATORY PERITONEAL DIALYSIS  (CAPD) CATHETER REMOVAL;  Surgeon: Ralene Ok, MD;  Location: [place];  Service: General;  Laterality: N/A;  . ESOPHAGOGASTRODUODENOSCOPY N/A 06/21/2015   Procedure: ESOPHAGOGASTRODUODENOSCOPY (EGD);  Surgeon: Laurence Spates, MD;  Location: [facility] ENDOSCOPY;  Service: Endoscopy;  Laterality: N/A;  . INSERTION OF DIALYSIS CATHETER Left 06/19/2015   Procedure: INSERTION OF DIALYSIS CATHETER LEFT INTERNAL JUGULAR;  Surgeon: Elam Dutch, MD;  Location: [place];  Service: Vascular;  Laterality: Left;  . peritoneal dialysis catheter placed     around 2015 in [place]     reports that he has quit smoking. His smoking use included cigarettes. he has never used smokeless tobacco. He reports that he uses drugs. Drug: Marijuana. He reports that he does not drink alcohol.  Allergies  Allergen Reactions  . Lisinopril Shortness Of Breath    Pt's family said he had SOB, Chest pressure , and coughing     Family History  Problem Relation Age of Onset  . Sudden Cardiac Death Neg Hx      Prior to Admission medications   Medication Sig Start Date End Date Taking? Authorizing Provider  amLODipine (NORVASC) 5 MG  tablet Take 5 mg by mouth daily.   Yes [provider]  aspirin EC 81 MG tablet Take 81 mg by mouth daily.   Yes [provider]  calcitRIOL (ROCALTROL) 0.25 MCG capsule Take 1 capsule (0.25 mcg total) by mouth Every Tuesday,Thursday,and Saturday with dialysis. Patient taking differently: Take 0.25 mcg by mouth  every Monday, Wednesday, and Friday.  06/26/15  Yes Hosie Poisson, MD  carvedilol (COREG) 25 MG tablet Take 25 mg by mouth 2 (two) times daily with a meal.   Yes [provider]  isosorbide-hydrALAZINE (BIDIL) 20-37.5 MG tablet Take 1 tablet by mouth 3 (three) times daily.   Yes [provider]  levETIRAcetam (KEPPRA) 500 MG tablet Take 500 mg by mouth 2 (two) times daily.   Yes [provider]  losartan (COZAAR) 50 MG tablet Take 50 mg by mouth daily.   Yes [provider]  pravastatin (PRAVACHOL) 20 MG tablet Take 20 mg by mouth every evening.   Yes [provider]    Physical Exam: Vitals:   08/08/17 0130 08/08/17 0257 08/08/17 0300 08/08/17 0330  BP: (!) 203/119 (!) 202/137 (!) 218/144 (!) 225/121  Pulse: 90 81 85 89  Resp: (!) 23 (!) 29 (!) 24 (!) 29  Temp:      TempSrc:      SpO2: 92% (!) 88% 91% 95%      Constitutional: in respiratory distress with tachypnea and accessory muscle recruitment at rest. No pallor or diaphoresis Eyes: PERTLA, lids and conjunctivae normal ENMT: Mucous membranes are moist. Posterior pharynx clear of any exudate or lesions.   Neck: normal, supple, no masses, no thyromegaly Respiratory: Diffuse rales. Increased WOB. No pallor or cyanosis.   Cardiovascular: S1 & S2 heard, regular rate and rhythm. No extremity edema. Pretibial pitting edema bilaterally.  Abdomen: No distension, no tenderness, no masses palpated. Bowel sounds normal.  Musculoskeletal: no clubbing / cyanosis. No joint deformity upper and lower extremities.    Skin: no significant rashes, lesions, ulcers. Warm, dry, well-perfused. Neurologic: No gross facial asymmetry. Sensation intact. Moving all extremities.  Psychiatric: Alert and oriented x 3. Pleasant and cooperative.     Labs on Admission: I have personally reviewed following labs and imaging studies  CBC: Recent Labs  Lab 08/08/17 0142  WBC 5.9  NEUTROABS 3.8  HGB 13.2  HCT  38.6*  MCV 97.5  PLT 408   Basic Metabolic Panel: Recent Labs  Lab 08/08/17 0142  NA 140  K >7.5*  CL 100*  CO2 18*  GLUCOSE 62*  BUN 110*  CREATININE 20.27*  CALCIUM 9.4   GFR: CrCl cannot be calculated (Unknown ideal weight.). Liver Function Tests: No results for input(s): AST, ALT, ALKPHOS, BILITOT, PROT, ALBUMIN in the last 168 hours. No results for input(s): LIPASE, AMYLASE in the last 168 hours. No results for input(s): AMMONIA in the last 168 hours. Coagulation Profile: No results for input(s): INR, PROTIME in the last 168 hours. Cardiac Enzymes: Recent Labs  Lab 08/08/17 0142  TROPONINI 0.05*   BNP (last 3 results) No results for input(s): PROBNP in the last 8760 hours. HbA1C: No results for input(s): HGBA1C in the last 72 hours. CBG: No results for input(s): GLUCAP in the last 168 hours. Lipid Profile: No results for input(s): CHOL, HDL, LDLCALC, TRIG, CHOLHDL, LDLDIRECT in the last 72 hours. Thyroid Function Tests: No results for input(s): TSH, T4TOTAL, FREET4, T3FREE, THYROIDAB in the last 72 hours. Anemia Panel: No results for input(s): VITAMINB12, FOLATE, FERRITIN,  TIBC, IRON, RETICCTPCT in the last 72 hours. Urine analysis: No results found for: COLORURINE, APPEARANCEUR, LABSPEC, PHURINE, GLUCOSEU, HGBUR, BILIRUBINUR, KETONESUR, PROTEINUR, UROBILINOGEN, NITRITE, LEUKOCYTESUR Sepsis Labs: @LABRCNTIP (procalcitonin:4,lacticidven:4) )No results found for this or any previous visit (from the past 240 hour(s)).   Radiological Exams on Admission: Dg Chest 2 View  Result Date: 08/08/2017 CLINICAL DATA:  Shortness of breath EXAM: CHEST  2 VIEW COMPARISON:  06/19/2015 FINDINGS: Moderate cardiomegaly with vascular congestion and diffuse interstitial opacity consistent with pulmonary edema. Small right-sided pleural effusion with more focal airspace disease in the right mid to lower lung. Aortic atherosclerosis. No pneumothorax. IMPRESSION: 1. Moderate  cardiomegaly with vascular congestion and mild interstitial edema 2. Small right pleural effusion. More focal airspace disease in the right mid to lower lung may reflect superimposed pneumonia Electronically Signed   By: Donavan Foil M.D.   On: 08/08/2017 02:21    EKG: Independently reviewed. Sinus rhythm with 1st degree AV block and non-specific IVCD.   Assessment/Plan   1. ESRD with hyperkalemia, pulmonary edema, hypoxic respiratory failure, metabolic acidosis, uremia, hypertensive urgency   - Presents with severe SOB in setting of going 1.5 weeks without HD  - Found to be in acute respiratory distress with pulmonary edema and hypoxia, BP 200/135, potassium ">7.5," mild acidosis - Treated in ED with calcium, albuterol, dextrose and insulin,  patiromer, and hydralazine  - Nephrology has been consulted and arranging urgent HD  - Continue cardiac monitoring, follow serial potassium levels until normalized, continue prn hydralazine IVP's, add amp of bicarbonate for acidosis and hyperkalemia, continue supplemental O2 and prn albuterol nebs    2. Hx of CVA  - No new deficits, continue ASA and statin    3. Elevated troponin  - Troponin slightly elevated to 0.05 without chest pain  - Likely secondary to #1  - Continue cardiac monitoring, treat as above, trend troponin measurements    DVT prophylaxis: sq heparin  Code Status: Full  Family Communication: Discussed with patient Disposition Plan: Admit to SDU Consults called: Nephrology Admission status: Inpatient    Vianne Bulls, MD [facility] Pager [phone]  If 7PM-7AM, please contact night-coverage [email or web address] Password [email or web address]  08/08/2017, 4:00 AM

## 2017-08-08 NOTE — Significant Event (Signed)
Rapid Response Event Note  Overview:  Called to assist with patient with seizure Time Called: 1005 Arrival Time: 1010 Event Type: Neurologic  Initial Focused Assessment: Supine in bed Resps regular slightly labored - post -ictal Gag present Lethargic - post- ictal arouses to name - few words Diaphoretic ST 120 - RR 24 O2 sats 93% on NRB  BP 223/113    Interventions:  On side - airway protected - Dr. Aggie Moats and Dr. Posey Pronto at bedside - HD terminated - BP treated with Apresoline per order.  Gradually becoming more alert - answers questions - states he has been taking his Keppra - bil BS clear - cough strong.  HR and RR normalizing. 100% on NRB mask.  Transferring to 69M - SDU overflow.   Keppra loading dose given in unit - patient alert - neuro intact - handoff to [employer].    Plan of Care (if not transferred):  Event Summary: Name of Physician Notified: Dr. Aggie Moats at (pta RRT)  Name of Consulting Physician Notified: Dr Posey Pronto at (pta RRT)  Outcome: Transferred (Comment)(74m02 - SDU overflow to ICU)     Quin Hoop

## 2017-08-08 NOTE — Progress Notes (Signed)
Pt state he thinks he is having a seizure; a seizure grand mal ensued for 1.53mins; Pt put on a non -rebreather and turned on to his side; Rapid response called; BP=228/113; hydralazine 10mg  IV administered; recheck BP=190/102; pt at 1042 alert and speaking to Debbie, RN Rapid Response; pt able to tell the frequency of his Seizure medication (Keppra) and he last took some yesterday night; CBG=87; last BP 190/102 HR 106; RR 28; sat 100% on a non-rebreather. Pt accompanied to 29M by Kennon Rounds, RN and Debbie Rapid Response RN.

## 2017-08-08 NOTE — Consult Note (Signed)
Reason for Consult: Hyperkalemia and volume overload in patient with ESRD Referring Physician: Mitzi Hansen M.D. (EDP)  HPI:  47 year old African-American man with past medical history significant for hypertension, diabetes, history of congestive heart failure and end-stage renal disease on hemodialysis who was getting his scheduled hemodialysis up in [place] [place] [facility] unit) and relocated to [place] rather abruptly before setting up outpatient hemodialysis here. He reports a change in his living situation that rendered him homeless forcing him to move back to [place]. He was formerly from here and was getting hemodialysis at [facility] before he moved to [place] in late 2017.  He started developing generalized malaise/fatigue with increasing shortness of breath and pedal edema as well as nausea prompting him to present into the emergency room for evaluation. He was found to have significant hyperkalemia, malignant hypertension and volume overload with pulmonary edema/hypoxia.  Per his recollection-estimated dry weight 94.5 kg. (He was 85.9 here).  Past Medical History:  Diagnosis Date  . CHF (congestive heart failure) ([place])   . Diabetes mellitus without complication ([place])   . Hypertension   . Renal disorder     Past Surgical History:  Procedure Laterality Date  . AV FISTULA PLACEMENT Left 06/19/2015   Procedure: LEFT ARTERIOVENOUS (AV) FISTULA CREATION;  Surgeon: Elam Dutch, MD;  Location: [place];  Service: Vascular;  Laterality: Left;  . CAPD REMOVAL N/A 06/22/2015   Procedure: CONTINUOUS AMBULATORY PERITONEAL DIALYSIS  (CAPD) CATHETER REMOVAL;  Surgeon: Ralene Ok, MD;  Location: [place];  Service: General;  Laterality: N/A;  . ESOPHAGOGASTRODUODENOSCOPY N/A 06/21/2015   Procedure: ESOPHAGOGASTRODUODENOSCOPY (EGD);  Surgeon: Laurence Spates, MD;  Location: [facility] ENDOSCOPY;  Service: Endoscopy;  Laterality: N/A;  . INSERTION OF DIALYSIS CATHETER  Left 06/19/2015   Procedure: INSERTION OF DIALYSIS CATHETER LEFT INTERNAL JUGULAR;  Surgeon: Elam Dutch, MD;  Location: [place];  Service: Vascular;  Laterality: Left;  . peritoneal dialysis catheter placed     around 2015 in [place]    Family History  Problem Relation Age of Onset  . Sudden Cardiac Death Neg Hx     Social History:  reports that he has quit smoking. His smoking use included cigarettes. he has never used smokeless tobacco. He reports that he uses drugs. Drug: Marijuana. He reports that he does not drink alcohol.  Allergies:  Allergies  Allergen Reactions  . Lisinopril Shortness Of Breath    Pt's family said he had SOB, Chest pressure , and coughing     Medications:  Scheduled: . amLODipine  5 mg Oral Daily  . aspirin EC  81 mg Oral Daily  . [START ON 08/10/2017] calcitRIOL  0.25 mcg Oral Q M,W,F  . carvedilol  25 mg Oral BID WC  . heparin  5,000 Units Subcutaneous Q8H  . isosorbide-hydrALAZINE  1 tablet Oral TID  . levETIRAcetam  500 mg Oral BID  . patiromer  16.8 g Oral Daily  . pravastatin  20 mg Oral q1800  . sodium chloride flush  3 mL Intravenous Q12H  . sodium chloride flush  3 mL Intravenous Q12H    BMP Latest Ref Rng & Units 08/08/2017 06/27/2015 06/26/2015  Glucose 65 - 99 mg/dL 62(L) 94 103(H)  BUN 6 - 20 mg/dL 110(H) 52(H) 99(H)  Creatinine 0.61 - 1.24 mg/dL 20.27(H) 12.78(H) 20.25(H)  Sodium 135 - 145 mmol/L 140 136 138  Potassium 3.5 - 5.1 mmol/L >7.5(HH) 4.1 5.0  Chloride 101 - 111 mmol/L 100(L) 98(L) 100(L)  CO2 22 -  32 mmol/L 18(L) 25 21(L)  Calcium 8.9 - 10.3 mg/dL 9.4 8.0(L) 8.1(L)   CBC Latest Ref Rng & Units 08/08/2017 06/26/2015 06/23/2015  WBC 4.0 - 10.5 K/uL 5.9 13.2(H) 12.6(H)  Hemoglobin 13.0 - 17.0 g/dL 13.2 8.3(L) 8.9(L)  Hematocrit 39.0 - 52.0 % 38.6(L) 24.5(L) 26.6(L)  Platelets 150 - 400 K/uL 174 231 162     Dg Chest 2 View  Result Date: 08/08/2017 CLINICAL DATA:  Shortness of breath EXAM: CHEST  2 VIEW COMPARISON:   06/19/2015 FINDINGS: Moderate cardiomegaly with vascular congestion and diffuse interstitial opacity consistent with pulmonary edema. Small right-sided pleural effusion with more focal airspace disease in the right mid to lower lung. Aortic atherosclerosis. No pneumothorax. IMPRESSION: 1. Moderate cardiomegaly with vascular congestion and mild interstitial edema 2. Small right pleural effusion. More focal airspace disease in the right mid to lower lung may reflect superimposed pneumonia Electronically Signed   By: Donavan Foil M.D.   On: 08/08/2017 02:21    Review of Systems  Constitutional: Positive for malaise/fatigue. Negative for chills, fever and weight loss.  HENT: Negative.   Eyes: Negative.   Respiratory: Positive for cough and shortness of breath. Negative for hemoptysis and sputum production.   Cardiovascular: Positive for orthopnea and leg swelling. Negative for chest pain, palpitations and claudication.  Gastrointestinal: Positive for nausea. Negative for abdominal pain, diarrhea, heartburn and vomiting.  Musculoskeletal: Negative for myalgias and neck pain.  Skin: Positive for itching. Negative for rash.  Neurological: Positive for headaches. Negative for tremors, sensory change and weakness.   Blood pressure (!) 194/109, pulse 91, temperature 97.6 F (36.4 C), temperature source Axillary, resp. rate (!) 24, SpO2 90 %. Physical Exam  Nursing note and vitals reviewed. Constitutional: He is oriented to person, place, and time. He appears well-developed and well-nourished. No distress.  Appears to be comfortable sitting propped up in hemodialysis with oxygen via nasal cannula  HENT:  Head: Normocephalic and atraumatic.  Mouth/Throat: Oropharynx is clear and moist. No oropharyngeal exudate.  Eyes: Conjunctivae are normal. Pupils are equal, round, and reactive to light. No scleral icterus.  Neck: Normal range of motion. Neck supple. JVD present. No thyromegaly present.   Cardiovascular: Normal rate, regular rhythm and normal heart sounds.  No murmur heard. Respiratory: Effort normal. He has wheezes. He has rales.  Bilateral rales over the lower bases with expiratory wheeze  GI: Soft. Bowel sounds are normal. There is no tenderness. There is no rebound.  Musculoskeletal: He exhibits edema.  2+ lower extremity edema. Left brachiocephalic megafistula with tortuosity  Neurological: He is alert and oriented to person, place, and time. No cranial nerve deficit.  Skin: Skin is warm and dry. No rash noted.    Assessment/Plan: 1. Hyperkalemia: emergent hemodialysis today and reevaluation again tomorrow to decide on additional dialysis needs. 2. Volume overload/malignant hypertension: emergent hemodialysis today for volume unloading, restart oral antihypertensive agents. 3. End-stage renal disease: will need to contact his dialysis center in [place] to see whether process was with regards to his placement here in [place] desires to go back to [facility]. Unfortunately, this might be hampered by the weekend. 4. Secondary hyperparathyroidism: we'll add a phosphorus level to previously drawn labs and attempt to get records from his dialysis unit in [place] to get information regarding binders/vitamin D receptor analogue. 5. Nutrition: will check albumin with next labs. 6. Anemia of chronic kidney disease: Hemoglobin currently at goal, no overt losses.  Koleton Duchemin K. 08/08/2017, 7:37 AM

## 2017-08-08 NOTE — ED Notes (Signed)
Spoke with Placement placement regarding pt transfer to [facility]

## 2017-08-08 NOTE — ED Triage Notes (Signed)
Pt brought in by EMS; pt moved from [place] due to being homeless to live with family in [place]; pt is dialysis pt on MWF but has not had diaylsis since moving to [place] last Friday; pt c/o of sever SOB but denies pain; Pt last ate 24 hours ago due decreased appetite; Pt states hx of DM, HTN, CHF, but takes no medications. Pt is a&ox 4 on arrival.-Monique,RN

## 2017-08-09 LAB — RENAL FUNCTION PANEL
ALBUMIN: 2.9 g/dL — AB (ref 3.5–5.0)
Anion gap: 19 — ABNORMAL HIGH (ref 5–15)
BUN: 67 mg/dL — AB (ref 6–20)
CALCIUM: 8.5 mg/dL — AB (ref 8.9–10.3)
CO2: 22 mmol/L (ref 22–32)
CREATININE: 14.83 mg/dL — AB (ref 0.61–1.24)
Chloride: 97 mmol/L — ABNORMAL LOW (ref 101–111)
GFR calc Af Amer: 4 mL/min — ABNORMAL LOW (ref >60)
GFR calc non Af Amer: 3 mL/min — ABNORMAL LOW (ref >60)
GLUCOSE: 126 mg/dL — AB (ref 65–99)
PHOSPHORUS: 7.9 mg/dL — AB (ref 2.5–4.6)
POTASSIUM: 4.6 mmol/L (ref 3.5–5.1)
SODIUM: 138 mmol/L (ref 135–145)

## 2017-08-09 LAB — GLUCOSE, CAPILLARY: GLUCOSE-CAPILLARY: 104 mg/dL — AB (ref 65–99)

## 2017-08-09 LAB — POTASSIUM
POTASSIUM: 4.7 mmol/L (ref 3.5–5.1)
Potassium: 4.5 mmol/L (ref 3.5–5.1)

## 2017-08-09 MED ORDER — LANTHANUM CARBONATE 500 MG PO CHEW
1000.0000 mg | CHEWABLE_TABLET | Freq: Three times a day (TID) | ORAL | Status: DC
  Administered 2017-08-09 – 2017-08-18 (×9): 1000 mg via ORAL
  Filled 2017-08-09 (×13): qty 2

## 2017-08-09 MED ORDER — RENA-VITE PO TABS
1.0000 | ORAL_TABLET | Freq: Every day | ORAL | Status: DC
Start: 2017-08-09 — End: 2017-08-18
  Administered 2017-08-09 – 2017-08-17 (×9): 1 via ORAL
  Filled 2017-08-09 (×9): qty 1

## 2017-08-09 MED ORDER — NEPRO/CARBSTEADY PO LIQD
237.0000 mL | ORAL | Status: DC
  Filled 2017-08-09 (×11): qty 237

## 2017-08-09 NOTE — Progress Notes (Addendum)
Patient ID: George [name], male   DOB: [birth year], 47 y.o.   MRN: [id number]  [facility] Progress Note   Assessment/ Plan:   1. Hyperkalemia: emergent hemodialysis was done yesterday with correction of hyperkalemia-additional hemodialysis planned for today in order to make up for his interrupted treatment as well as to continue volume unloading to help reduce oxygen dependency. 2. Volume overload/malignant hypertension: Continue efforts at volume unloading and restarted oral antihypertensive therapy. 3. End-stage renal disease:  continue hemodialysis possibly on a TTS schedule if he gets his dialysis done today. He reports that the process had been initiated to transfer him to a [place] unit [place]) close to his mother's residence prior to his departure from [place] but had not been finalized. We will attempt to corroborate this on Monday. 4. Secondary hyperparathyroidism: Hyperphosphatemia noted on labs -restart binders. Continue calcitriol for PTH suppression. 5. Nutrition: Low albumin level, restart renal vitamin/nutritional supplementation.  6. Anemia of chronic kidney disease: Hemoglobin currently at goal, no overt losses. 7. Seizure: With past history of seizure disorder and reports good compliance with his Keppra. Seizure threshold possibly lowered by BUN/electrolyte/fluid shifts on hemodialysis yesterday. Drug screen pending.   Subjective:   Report some mild shortness of breath this morning without any chest pain. Yesterday had a seizure during dialysis requiring early termination of treatment with admission to the MICU transiently for observation following which he was transferred to the renal floor. He reports that he had to leave [place] rather abruptly after he was rendered homeless almost certainly having bounced around different living places for the past year after his wife left him.    Objective:   BP (!) 185/94 (BP Location: Right Arm)   Pulse 70   Temp  98.2 F (36.8 C) (Oral)   Resp 18   Ht 6' (1.829 m)   SpO2 92%   BMI 25.68 kg/m   Physical Exam: Gen: Comfortably resting in bed, on oxygen via nasal cannula CVS: Pulse regular rhythm, normal rate, S1 and S2 normal Resp: Fine rales by basally, no distinct rhonchi/wheeze Abd: Soft, obese, nontender Ext: 3-4 plus edema over lower extremities  Labs: BMET Recent Labs  Lab 08/08/17 0142 08/08/17 1136 08/08/17 1829 08/09/17 0003 08/09/17 0402  NA 140 139  --   --  138  K >7.5* 4.0  4.0 4.3 4.7 4.6  CL 100* 96*  --   --  97*  CO2 18* 19*  --   --  22  GLUCOSE 62* 81  --   --  126*  BUN 110* 56*  --   --  67*  CREATININE 20.27* 13.03*  --   --  14.83*  CALCIUM 9.4 8.9  --   --  8.5*  PHOS  --   --  7.3*  --  7.9*   CBC Recent Labs  Lab 08/08/17 0142  WBC 5.9  NEUTROABS 3.8  HGB 13.2  HCT 38.6*  MCV 97.5  PLT 174   Medications:    . amLODipine  5 mg Oral Daily  . aspirin EC  81 mg Oral Daily  . [START ON 08/10/2017] calcitRIOL  0.25 mcg Oral Q M,W,F  . carvedilol  25 mg Oral BID WC  . heparin  5,000 Units Subcutaneous Q8H  . isosorbide-hydrALAZINE  1 tablet Oral TID  . mouth rinse  15 mL Mouth Rinse BID  . patiromer  16.8 g Oral Daily  . pravastatin  20 mg Oral q1800  . sodium chloride flush  3 mL Intravenous Q12H  . sodium chloride flush  3 mL Intravenous Q12H   Elmarie Shiley, MD 08/09/2017, 10:23 AM

## 2017-08-09 NOTE — Progress Notes (Signed)
[facility] PROGRESS NOTE  George [name] VQQ:[id number] DOB: 1970-09-15 DOA: 08/08/2017 PCP: Patient, No Pcp Per  Assessment/Plan:  Hyperkalemia Resolved after HD  Seizure - seizure precautions - IV keppra BID, plus loading dose - moved to ICU on SDU status - IV ativan prn  Hypoxia 2/2 pulm edema - likely due to seizure, pt improving - CXR yesterday showed pulm edema, pt still on O2  Hyperkalemia - recheck K+ in AM  Hx of CVA Cont ASA and statin  Mild trop/Long QT Likely due to hypoxia and pulm edema ECHO pending   Code Status: FC Family Communication: none available (indicate person spoken with, relationship, and if by phone, the number) Disposition Plan: home   Consultants:  Neprho  Procedures:  HD  HPI/Subjective: Feels well. Denies SOB. No pain.  Objective: Vitals:   08/09/17 1200 08/09/17 1230  BP: (!) 160/93 (!) 177/95  Pulse: 69 71  Resp:    Temp:    SpO2:      Intake/Output Summary (Last 24 hours) at 08/09/2017 1254 Last data filed at 08/09/2017 0600 Gross per 24 hour  Intake 345 ml  Output 0 ml  Net 345 ml   Filed Weights   08/09/17 1100  Weight: 100.5 kg (221 lb 9 oz)    Exam:   General:  NCAT, NAD  Cardiovascular: RRR, bruit heard  Respiratory: CTAB, nl wob  Abdomen: ND, NT  Musculoskeletal: moving all extr   Data Reviewed: Basic Metabolic Panel: Recent Labs  Lab 08/08/17 0142 08/08/17 1136 08/08/17 1829 08/09/17 0003 08/09/17 0402 08/09/17 0914  NA 140 139  --   --  138  --   K >7.5* 4.0  4.0 4.3 4.7 4.6 4.5  CL 100* 96*  --   --  97*  --   CO2 18* 19*  --   --  22  --   GLUCOSE 62* 81  --   --  126*  --   BUN 110* 56*  --   --  67*  --   CREATININE 20.27* 13.03*  --   --  14.83*  --   CALCIUM 9.4 8.9  --   --  8.5*  --   MG  --   --  2.8*  --   --   --   PHOS  --   --  7.3*  --  7.9*  --    Liver Function Tests: Recent Labs  Lab 08/09/17 0402  ALBUMIN 2.9*   No results for input(s): LIPASE,  AMYLASE in the last 168 hours. No results for input(s): AMMONIA in the last 168 hours. CBC: Recent Labs  Lab 08/08/17 0142  WBC 5.9  NEUTROABS 3.8  HGB 13.2  HCT 38.6*  MCV 97.5  PLT 174   Cardiac Enzymes: Recent Labs  Lab 08/08/17 0142 08/08/17 0402 08/08/17 1136 08/08/17 1829  TROPONINI 0.05* 0.05* 0.06* 0.15*   BNP (last 3 results) No results for input(s): BNP in the last 8760 hours.  ProBNP (last 3 results) No results for input(s): PROBNP in the last 8760 hours.  CBG: Recent Labs  Lab 08/08/17 0418 08/08/17 1208  GLUCAP 89 77    Recent Results (from the past 240 hour(s))  MRSA PCR Screening     Status: None   Collection Time: 08/08/17 12:03 PM  Result Value Ref Range Status   MRSA by PCR NEGATIVE NEGATIVE Final    Comment:        The GeneXpert MRSA Assay (FDA approved for  NASAL specimens only), is one component of a comprehensive MRSA colonization surveillance program. It is not intended to diagnose MRSA infection nor to guide or monitor treatment for MRSA infections. Performed at [facility], [place] [address] [place], [place] [address]      Studies: Dg Chest 2 View  Result Date: 08/08/2017 CLINICAL DATA:  Shortness of breath EXAM: CHEST  2 VIEW COMPARISON:  06/19/2015 FINDINGS: Moderate cardiomegaly with vascular congestion and diffuse interstitial opacity consistent with pulmonary edema. Small right-sided pleural effusion with more focal airspace disease in the right mid to lower lung. Aortic atherosclerosis. No pneumothorax. IMPRESSION: 1. Moderate cardiomegaly with vascular congestion and mild interstitial edema 2. Small right pleural effusion. More focal airspace disease in the right mid to lower lung may reflect superimposed pneumonia Electronically Signed   By: Donavan Foil M.D.   On: 08/08/2017 02:21   Dg Chest Port 1 View  Result Date: 08/08/2017 CLINICAL DATA:  Hypoxia and shortness of breath. EXAM: PORTABLE CHEST 1 VIEW COMPARISON:   08/08/2017 FINDINGS: Cardiomegaly with pulmonary vascular congestion noted. Bilateral interstitial opacities likely represent interstitial edema. A small right pleural effusion and right lower lung atelectasis versus airspace disease noted. There is no evidence of pneumothorax. IMPRESSION: Unchanged appearance of the chest with cardiomegaly,interstitial pulmonary edema, small right pleural effusion and right lower lung atelectasis/airspace disease again noted. Electronically Signed   By: Margarette Canada M.D.   On: 08/08/2017 11:43    Scheduled Meds: . amLODipine  5 mg Oral Daily  . aspirin EC  81 mg Oral Daily  . [START ON 08/10/2017] calcitRIOL  0.25 mcg Oral Q M,W,F  . carvedilol  25 mg Oral BID WC  . feeding supplement (NEPRO CARB STEADY)  237 mL Oral Q24H  . heparin  5,000 Units Subcutaneous Q8H  . isosorbide-hydrALAZINE  1 tablet Oral TID  . lanthanum  1,000 mg Oral TID WC  . mouth rinse  15 mL Mouth Rinse BID  . multivitamin  1 tablet Oral QHS  . patiromer  16.8 g Oral Daily  . pravastatin  20 mg Oral q1800  . sodium chloride flush  3 mL Intravenous Q12H  . sodium chloride flush  3 mL Intravenous Q12H   Continuous Infusions: . sodium chloride    . sodium chloride    . sodium chloride    . levETIRAcetam Stopped (08/08/17 2355)    Principal Problem:   Hyperkalemia Active Problems:   Acute respiratory failure (HCC)   Essential hypertension   High anion gap metabolic acidosis   ESRD needing dialysis ([place])   Type 2 diabetes mellitus with complication (HCC)   Hemiparesis affecting left side as late effect of stroke ([place])   Hypertensive urgency   Elevated troponin   Acute pulmonary edema ([place])   Seizure ([place])    Time spent: [facility] Pager AMION. If 7PM-7AM, please contact night-coverage at [email or web address], password [facility] 08/09/2017, 12:54 PM  LOS: 1 day

## 2017-08-10 ENCOUNTER — Inpatient Hospital Stay (HOSPITAL_COMMUNITY): Payer: Medicare Other

## 2017-08-10 DIAGNOSIS — R569 Unspecified convulsions: Secondary | ICD-10-CM

## 2017-08-10 DIAGNOSIS — R9431 Abnormal electrocardiogram [ECG] [EKG]: Secondary | ICD-10-CM

## 2017-08-10 LAB — RENAL FUNCTION PANEL
Albumin: 2.8 g/dL — ABNORMAL LOW (ref 3.5–5.0)
Anion gap: 13 (ref 5–15)
BUN: 39 mg/dL — AB (ref 6–20)
CALCIUM: 8.3 mg/dL — AB (ref 8.9–10.3)
CHLORIDE: 98 mmol/L — AB (ref 101–111)
CO2: 24 mmol/L (ref 22–32)
CREATININE: 11.12 mg/dL — AB (ref 0.61–1.24)
GFR calc Af Amer: 6 mL/min — ABNORMAL LOW (ref >60)
GFR calc non Af Amer: 5 mL/min — ABNORMAL LOW (ref >60)
Glucose, Bld: 87 mg/dL (ref 65–99)
Phosphorus: 5.7 mg/dL — ABNORMAL HIGH (ref 2.5–4.6)
Potassium: 4.1 mmol/L (ref 3.5–5.1)
SODIUM: 135 mmol/L (ref 135–145)

## 2017-08-10 LAB — HEPATITIS B CORE ANTIBODY, TOTAL: Hep B Core Total Ab: NEGATIVE

## 2017-08-10 LAB — ECHOCARDIOGRAM COMPLETE
HEIGHTINCHES: 72 in
Weight: 3416 oz

## 2017-08-10 LAB — HEPATITIS B SURFACE ANTIBODY,QUALITATIVE: HEP B S AB: REACTIVE

## 2017-08-10 LAB — GLUCOSE, CAPILLARY: GLUCOSE-CAPILLARY: 87 mg/dL (ref 65–99)

## 2017-08-10 MED ORDER — SODIUM CHLORIDE 0.9 % IV SOLN
500.0000 mg | Freq: Two times a day (BID) | INTRAVENOUS | Status: DC
  Filled 2017-08-10 (×3): qty 5

## 2017-08-10 NOTE — Progress Notes (Signed)
[facility] PROGRESS NOTE  George [name] PTW:[id number] DOB: January 20, 1971 DOA: 08/08/2017 PCP: George [name], No Pcp Per  Assessment/Plan:  Hyperkalemia Resolved after HD  ESRD Nephro following CM/SW engaged to find OP HD center  Seizure - seizure precautions - IV keppra BID - moved from sdu -> tele - IV ativan prn  Hypoxia 2/2 pulm edema; resolved - likely due to seizure, pt improving - CXR yesterday showed pulm edema, pt still off O2  Hyperkalemia - recheck K+ in AM if needed  Hx of CVA Cont ASA and statin  Mild trop/Long QT Likely due to hypoxia and pulm edema ECHO pending   Code Status: FC Family Communication: none available (indicate person spoken with, relationship, and if by phone, the number) Disposition Plan: home today likely   Consultants:  Neprho  Procedures:  HD  HPI/Subjective: Feels well. Denies SOB. No pain. Wants to go home  Objective: Vitals:   08/10/17 0444 08/10/17 0854  BP: (!) 166/90 (!) 154/82  Pulse: 71 74  Resp: 16 16  Temp: 98.1 F (36.7 C) 98.5 F (36.9 C)  SpO2: 91% 94%    Intake/Output Summary (Last 24 hours) at 08/10/2017 1213 Last data filed at 08/10/2017 1000 Gross per 24 hour  Intake 942 ml  Output 4500 ml  Net -3558 ml   Filed Weights   08/09/17 1100 08/09/17 1519 08/09/17 2250  Weight: 100.5 kg (221 lb 9 oz) 96 kg (211 lb 10.3 oz) 96.8 kg (213 lb 8 oz)    Exam:   General:  NCAT, NAD  Cardiovascular: RRR, bruit heard  Respiratory: CTAB, nl wob  Abdomen: ND, NT  Musculoskeletal: moving all extr   Data Reviewed: Basic Metabolic Panel: Recent Labs  Lab 08/08/17 0142 08/08/17 1136 08/08/17 1829 08/09/17 0003 08/09/17 0402 08/09/17 0914  NA 140 139  --   --  138  --   K >7.5* 4.0  4.0 4.3 4.7 4.6 4.5  CL 100* 96*  --   --  97*  --   CO2 18* 19*  --   --  22  --   GLUCOSE 62* 81  --   --  126*  --   BUN 110* 56*  --   --  67*  --   CREATININE 20.27* 13.03*  --   --  14.83*  --   CALCIUM  9.4 8.9  --   --  8.5*  --   MG  --   --  2.8*  --   --   --   PHOS  --   --  7.3*  --  7.9*  --    Liver Function Tests: Recent Labs  Lab 08/09/17 0402  ALBUMIN 2.9*   No results for input(s): LIPASE, AMYLASE in the last 168 hours. No results for input(s): AMMONIA in the last 168 hours. CBC: Recent Labs  Lab 08/08/17 0142  WBC 5.9  NEUTROABS 3.8  HGB 13.2  HCT 38.6*  MCV 97.5  PLT 174   Cardiac Enzymes: Recent Labs  Lab 08/08/17 0142 08/08/17 0402 08/08/17 1136 08/08/17 1829  TROPONINI 0.05* 0.05* 0.06* 0.15*   BNP (last 3 results) No results for input(s): BNP in the last 8760 hours.  ProBNP (last 3 results) No results for input(s): PROBNP in the last 8760 hours.  CBG: Recent Labs  Lab 08/08/17 0418 08/08/17 1027 08/08/17 1208 08/09/17 1619  GLUCAP 89 87 77 104*    Recent Results (from the past 240 hour(s))  MRSA PCR Screening  Status: None   Collection Time: 08/08/17 12:03 PM  Result Value Ref Range Status   MRSA by PCR NEGATIVE NEGATIVE Final    Comment:        The GeneXpert MRSA Assay (FDA approved for NASAL specimens only), is one component of a comprehensive MRSA colonization surveillance program. It is not intended to diagnose MRSA infection nor to guide or monitor treatment for MRSA infections. Performed at [facility], [place] [address], [place], [place] [address]      Studies: Dg Chest 2 View  Result Date: 08/10/2017 CLINICAL DATA:  Acute respiratory failure, CHF, diabetes, dialysis dependent renal failure, former smoker. EXAM: CHEST  2 VIEW COMPARISON:  Portable chest x-ray of August 08, 2017 FINDINGS: The lungs are well-expanded. There remains a small right pleural effusion. The cardiac silhouette remains enlarged. The right perihilar region remains dense. There is fluid in the minor fissure. The pulmonary vascularity is mildly engorged. There calcification in the wall of the aortic arch. IMPRESSION: CHF with mild interstitial  edema, right perihilar atelectasis or pneumonia, and right pleural effusion. There has not been significant interval change since the earlier study. Thoracic aortic atherosclerosis. Electronically Signed   By: David  Martinique M.D.   On: 08/10/2017 07:21    Scheduled Meds: . amLODipine  5 mg Oral Daily  . aspirin EC  81 mg Oral Daily  . calcitRIOL  0.25 mcg Oral Q M,W,F  . carvedilol  25 mg Oral BID WC  . feeding supplement (NEPRO CARB STEADY)  237 mL Oral Q24H  . heparin  5,000 Units Subcutaneous Q8H  . isosorbide-hydrALAZINE  1 tablet Oral TID  . lanthanum  1,000 mg Oral TID WC  . mouth rinse  15 mL Mouth Rinse BID  . multivitamin  1 tablet Oral QHS  . pravastatin  20 mg Oral q1800  . sodium chloride flush  3 mL Intravenous Q12H  . sodium chloride flush  3 mL Intravenous Q12H   Continuous Infusions: . sodium chloride    . levETIRAcetam      Principal Problem:   Hyperkalemia Active Problems:   Acute respiratory failure (HCC)   Essential hypertension   High anion gap metabolic acidosis   ESRD needing dialysis ([place])   Type 2 diabetes mellitus with complication (HCC)   Hemiparesis affecting left side as late effect of stroke ([place])   Hypertensive urgency   Elevated troponin   Acute pulmonary edema ([place])   Seizure ([place])    Time spent: [facility] Pager AMION. If 7PM-7AM, please contact night-coverage at [email or web address], password [facility] 08/10/2017, 12:13 PM  LOS: 2 days

## 2017-08-10 NOTE — Progress Notes (Signed)
  Echocardiogram 2D Echocardiogram has been performed.  Merrie Roof F 08/10/2017, 4:35 PM

## 2017-08-10 NOTE — Care Management Note (Addendum)
Case Management Note  Patient Details  Name: George [name] MRN: [id number] Date of Birth: 07-19-1970  Subjective/Objective:     Admitted for Hyperkalemia and volume overload.  Was getting his scheduled hemodialysis up in [place], [place] [facility] unit) and relocated to [place] rather abruptly   Action/Plan: Hemodialysis.  Restart oral antihypertensive agents. No PCP noted, to be addressed prior to discharge. Call placed to [facility] line busy x 3. Call placed to [facility] and they are not accepting new patients, stated they are losing two providers. PCP appointment scheduled see discharge instructions.  Expected Discharge Date:    N/A             Expected Discharge Plan:  Home/Self Care  In-House Referral:  Clinical Social Work(Hemodialysis clipping process)  Discharge planning Services  CM Consult  Status of Service:  In process, will continue to follow  Additional Comments: SW "Lorriane Shire" following for HD Clipping.  Prior to admission patient lived at home.   NCM will Continue to follow for discharge needs.   Kristen Cardinal, RN  Nurse Case Manager [phone] 08/10/2017, 10:25 AM

## 2017-08-10 NOTE — Progress Notes (Signed)
Patient ID: George [name], male   DOB: 04-Nov-1970, 47 y.o.   MRN: [id number]  [facility] Progress Note    Subjective:   No complaints, feels better   Objective:   BP (!) 154/82 (BP Location: Right Arm)   Pulse 74   Temp 98.5 F (36.9 C) (Oral)   Resp 16   Ht 6' (1.829 m)   Wt 96.8 kg (213 lb 8 oz)   SpO2 94%   BMI 28.96 kg/m   Intake/Output: I/O last 3 completed shifts: In: 927 [P.O.:822; IV Piggyback:105] Out: 4500 [Other:4500]   Intake/Output this shift:  Total I/O In: 720 [P.O.:720] Out: -  Weight change:   Physical Exam: Gen:NAD CVS: no rub Resp: occ rhonchi Abd: benign Ext: 2+ edema, LAVF +T/B  Labs: BMET Recent Labs  Lab 08/08/17 0142 08/08/17 1136 08/08/17 1829 08/09/17 0003 08/09/17 0402 08/09/17 0914  NA 140 139  --   --  138  --   K >7.5* 4.0  4.0 4.3 4.7 4.6 4.5  CL 100* 96*  --   --  97*  --   CO2 18* 19*  --   --  22  --   GLUCOSE 62* 81  --   --  126*  --   BUN 110* 56*  --   --  67*  --   CREATININE 20.27* 13.03*  --   --  14.83*  --   ALBUMIN  --   --   --   --  2.9*  --   CALCIUM 9.4 8.9  --   --  8.5*  --   PHOS  --   --  7.3*  --  7.9*  --    CBC Recent Labs  Lab 08/08/17 0142  WBC 5.9  NEUTROABS 3.8  HGB 13.2  HCT 38.6*  MCV 97.5  PLT 174    @IMGRELPRIORS @ Medications:    . amLODipine  5 mg Oral Daily  . aspirin EC  81 mg Oral Daily  . calcitRIOL  0.25 mcg Oral Q M,W,F  . carvedilol  25 mg Oral BID WC  . feeding supplement (NEPRO CARB STEADY)  237 mL Oral Q24H  . heparin  5,000 Units Subcutaneous Q8H  . isosorbide-hydrALAZINE  1 tablet Oral TID  . lanthanum  1,000 mg Oral TID WC  . mouth rinse  15 mL Mouth Rinse BID  . multivitamin  1 tablet Oral QHS  . pravastatin  20 mg Oral q1800  . sodium chloride flush  3 mL Intravenous Q12H  . sodium chloride flush  3 mL Intravenous Q12H     Assessment/ Plan:   1. Hyperkalemia- due to noncompliance with HD.  Improved after HD yesterday. 2. ESRD normally  on TTS schedule.  Plan for HD tomorrow. 3. Anemia: no esa for hgb >12 4. CKD-MBD: hyperphosphatemic started binders, continue with vit D. 5. Nutrition: mild protein malnutrition, renal diet 6. Hypertension: stable with UF 7. Seizure disorder- had seizure with first HD session but none yesterday.  Continue with meds.  Drug screen pending.   8. Disposition- pt is without permanent residence but states that he is staying with his sister.  Will need SW and CM assistance.  Still waiting for outpatient HD spot.  Donetta Potts, MD [place] Pager [phone] 08/10/2017, 1:28 PM

## 2017-08-11 DIAGNOSIS — I509 Heart failure, unspecified: Secondary | ICD-10-CM

## 2017-08-11 LAB — RENAL FUNCTION PANEL
ALBUMIN: 3.1 g/dL — AB (ref 3.5–5.0)
ANION GAP: 17 — AB (ref 5–15)
BUN: 49 mg/dL — ABNORMAL HIGH (ref 6–20)
CO2: 22 mmol/L (ref 22–32)
CREATININE: 12.44 mg/dL — AB (ref 0.61–1.24)
Calcium: 8.8 mg/dL — ABNORMAL LOW (ref 8.9–10.3)
Chloride: 97 mmol/L — ABNORMAL LOW (ref 101–111)
GFR calc non Af Amer: 4 mL/min — ABNORMAL LOW (ref >60)
GFR, EST AFRICAN AMERICAN: 5 mL/min — AB (ref >60)
Glucose, Bld: 90 mg/dL (ref 65–99)
PHOSPHORUS: 7 mg/dL — AB (ref 2.5–4.6)
Potassium: 4.4 mmol/L (ref 3.5–5.1)
Sodium: 136 mmol/L (ref 135–145)

## 2017-08-11 LAB — CBC
HCT: 34.2 % — ABNORMAL LOW (ref 39.0–52.0)
HEMOGLOBIN: 11.5 g/dL — AB (ref 13.0–17.0)
MCH: 32.7 pg (ref 26.0–34.0)
MCHC: 33.6 g/dL (ref 30.0–36.0)
MCV: 97.2 fL (ref 78.0–100.0)
PLATELETS: 149 10*3/uL — AB (ref 150–400)
RBC: 3.52 MIL/uL — AB (ref 4.22–5.81)
RDW: 15.9 % — ABNORMAL HIGH (ref 11.5–15.5)
WBC: 5.5 10*3/uL (ref 4.0–10.5)

## 2017-08-11 MED ORDER — LEVETIRACETAM 500 MG PO TABS
500.0000 mg | ORAL_TABLET | Freq: Two times a day (BID) | ORAL | Status: DC
  Administered 2017-08-11 – 2017-08-18 (×14): 500 mg via ORAL
  Filled 2017-08-11 (×14): qty 1

## 2017-08-11 NOTE — Progress Notes (Signed)
Called by CCMD that patient's telemetry is off. When checked on patient,he has his tele off and is on the bed. Informed patient that I  have to place telemetry back on,pt states "I'm taking a shower". Informed patient that I have to ask MD first because he's on heart monitor. Patient refused to be placed on  tele adamant on taking a shower. Patient also refused his IV keppra last night,states "I don't take any IV,I take pill. Explained to patient that he's on IV for now. Pt states "I don't want to take any medicine,I'm going home". Khloey Chern, Wonda Cheng, Therapist, sports

## 2017-08-11 NOTE — Progress Notes (Signed)
[facility] PROGRESS NOTE  George [name]:[id number] DOB: December 12, 1970 DOA: 08/08/2017 PCP: Patient, No Pcp Per  Brief:  47 year old male past medical history of seizures, hemodialysis, hypertension, diabetes and heart failure presented with weakness.  Was found to have hyperkalemia.  He emergently received dialysis.  In the middle treatment patient had a seizure.  Became hypoxic.  Rapid response called.  Patient came around with time and oxygen supplementation.  Sent to stepdown unit.  Loaded with Keppra IV.  Patient improved.  Return to unit for continued dialysis.  Finished treatment without episode.  No seizure since.  Initial EKG showed very prolonged QT.  With correction of electrolytes this is improved.  Cardiac echo done that shows grade 2 diastolic dysfunction.  Patient currently waiting on dialysis chair for discharge.  Assessment/Plan:  Hyperkalemia Resolved after HD [facility]  ESRD Nephro following CM/SW engaged to find OP HD center  CHF Grad 2 diastolic sys Cont coreg and bidil  Will consider stopping norvasc  Seizure - seizure precautions - IV-->PO keppra BID - moved from sdu -> tele - IV ativan prn  Hypoxia 2/2 pulm edema; resolved - likely due to seizure, pt improving - CXR yesterday showed pulm edema, pt still off O2  Hyperkalemia - recheck K+ in AM if needed  Hx of CVA Cont ASA and statin  Mild trop/Long QT Likely due to hypoxia and pulm edema ECHO--> Grade 2 dia dysf   Code Status: FC Family Communication: none available (indicate person spoken with, relationship, and if by phone, the number) Disposition Plan: home today likely   Consultants:  Neprho  Procedures:  HD  HPI/Subjective: Feels well. Denies SOB. No pain. Wants to go home  Objective: Vitals:   08/11/17 1630 08/11/17 1639  BP: 138/76 (!) 155/76  Pulse: 73 73  Resp:  (!) 24  Temp:  98.8 F (37.1 C)  SpO2:  92%    Intake/Output Summary (Last 24 hours) at 08/11/2017  1700 Last data filed at 08/11/2017 1639 Gross per 24 hour  Intake 540 ml  Output 4000 ml  Net -3460 ml   Filed Weights   08/10/17 2011 08/11/17 1222 08/11/17 1639  Weight: 96.7 kg (213 lb 3 oz) 99.7 kg (219 lb 12.8 oz) 96.5 kg (212 lb 11.9 oz)    Exam:   General:  NCAT, NAD  Cardiovascular: RRR, bruit heard  Respiratory: CTAB, nl wob  Abdomen: ND, NT  Musculoskeletal: moving all extr   Data Reviewed: Basic Metabolic Panel: Recent Labs  Lab 08/08/17 0142 08/08/17 1136 08/08/17 1829 08/09/17 0003 08/09/17 0402 08/09/17 0914 08/10/17 1512 08/11/17 0614  NA 140 139  --   --  138  --  135 136  K >7.5* 4.0  4.0 4.3 4.7 4.6 4.5 4.1 4.4  CL 100* 96*  --   --  97*  --  98* 97*  CO2 18* 19*  --   --  22  --  24 22  GLUCOSE 62* 81  --   --  126*  --  87 90  BUN 110* 56*  --   --  67*  --  39* 49*  CREATININE 20.27* 13.03*  --   --  14.83*  --  11.12* 12.44*  CALCIUM 9.4 8.9  --   --  8.5*  --  8.3* 8.8*  MG  --   --  2.8*  --   --   --   --   --   PHOS  --   --  7.3*  --  7.9*  --  5.7* 7.0*   Liver Function Tests: Recent Labs  Lab 08/09/17 0402 08/10/17 1512 08/11/17 0614  ALBUMIN 2.9* 2.8* 3.1*   No results for input(s): LIPASE, AMYLASE in the last 168 hours. No results for input(s): AMMONIA in the last 168 hours. CBC: Recent Labs  Lab 08/08/17 0142 08/11/17 0614  WBC 5.9 5.5  NEUTROABS 3.8  --   HGB 13.2 11.5*  HCT 38.6* 34.2*  MCV 97.5 97.2  PLT 174 149*   Cardiac Enzymes: Recent Labs  Lab 08/08/17 0142 08/08/17 0402 08/08/17 1136 08/08/17 1829  TROPONINI 0.05* 0.05* 0.06* 0.15*   BNP (last 3 results) No results for input(s): BNP in the last 8760 hours.  ProBNP (last 3 results) No results for input(s): PROBNP in the last 8760 hours.  CBG: Recent Labs  Lab 08/08/17 0418 08/08/17 1027 08/08/17 1208 08/09/17 1619  GLUCAP 89 87 77 104*    Recent Results (from the past 240 hour(s))  MRSA PCR Screening     Status: None   Collection  Time: 08/08/17 12:03 PM  Result Value Ref Range Status   MRSA by PCR NEGATIVE NEGATIVE Final    Comment:        The GeneXpert MRSA Assay (FDA approved for NASAL specimens only), is one component of a comprehensive MRSA colonization surveillance program. It is not intended to diagnose MRSA infection nor to guide or monitor treatment for MRSA infections. Performed at [facility], [place] [address], [place], [place] [address]      Studies: Dg Chest 2 View  Result Date: 08/10/2017 CLINICAL DATA:  Acute respiratory failure, CHF, diabetes, dialysis dependent renal failure, former smoker. EXAM: CHEST  2 VIEW COMPARISON:  Portable chest x-ray of August 08, 2017 FINDINGS: The lungs are well-expanded. There remains a small right pleural effusion. The cardiac silhouette remains enlarged. The right perihilar region remains dense. There is fluid in the minor fissure. The pulmonary vascularity is mildly engorged. There calcification in the wall of the aortic arch. IMPRESSION: CHF with mild interstitial edema, right perihilar atelectasis or pneumonia, and right pleural effusion. There has not been significant interval change since the earlier study. Thoracic aortic atherosclerosis. Electronically Signed   By: David  Martinique M.D.   On: 08/10/2017 07:21    Scheduled Meds: . amLODipine  5 mg Oral Daily  . aspirin EC  81 mg Oral Daily  . calcitRIOL  0.25 mcg Oral Q M,W,F  . carvedilol  25 mg Oral BID WC  . feeding supplement (NEPRO CARB STEADY)  237 mL Oral Q24H  . heparin  5,000 Units Subcutaneous Q8H  . isosorbide-hydrALAZINE  1 tablet Oral TID  . lanthanum  1,000 mg Oral TID WC  . levETIRAcetam  500 mg Oral BID  . mouth rinse  15 mL Mouth Rinse BID  . multivitamin  1 tablet Oral QHS  . pravastatin  20 mg Oral q1800  . sodium chloride flush  3 mL Intravenous Q12H  . sodium chloride flush  3 mL Intravenous Q12H   Continuous Infusions: . sodium chloride      Principal Problem:    Hyperkalemia Active Problems:   Acute respiratory failure (HCC)   Essential hypertension   High anion gap metabolic acidosis   ESRD needing dialysis ([place])   Type 2 diabetes mellitus with complication (HCC)   Hemiparesis affecting left side as late effect of stroke (HCC)   Hypertensive urgency   Elevated troponin   Acute pulmonary  edema ([place])   Seizure ([place])    Time spent: [facility] Pager AMION. If 7PM-7AM, please contact night-coverage at [email or web address], password [facility] 08/11/2017, 5:00 PM  LOS: 3 days

## 2017-08-11 NOTE — Progress Notes (Signed)
  [facility] Progress Note    Subjective:   Feels well   Objective:   BP (!) 188/105 (BP Location: Right Arm)   Pulse 66   Temp 98.5 F (36.9 C) (Oral)   Resp 20   Ht 6' (1.829 m)   Wt 96.7 kg (213 lb 3 oz)   SpO2 98%   BMI 28.91 kg/m   Intake/Output: I/O last 3 completed shifts: In: 1020 [P.O.:1020] Out: 0    Intake/Output this shift:  Total I/O In: 240 [P.O.:240] Out: -  Weight change: -3.8 kg (-6 oz)  Physical Exam: Gen: NAD CVS: no rub Resp: cta Abd: benign Ext: 1+ edema, LUE AVF +T/B  Labs: BMET Recent Labs  Lab 08/08/17 0142 08/08/17 1136 08/08/17 1829 08/09/17 0003 08/09/17 0402 08/09/17 0914 08/10/17 1512  NA 140 139  --   --  138  --  135  K >7.5* 4.0  4.0 4.3 4.7 4.6 4.5 4.1  CL 100* 96*  --   --  97*  --  98*  CO2 18* 19*  --   --  22  --  24  GLUCOSE 62* 81  --   --  126*  --  87  BUN 110* 56*  --   --  67*  --  39*  CREATININE 20.27* 13.03*  --   --  14.83*  --  11.12*  ALBUMIN  --   --   --   --  2.9*  --  2.8*  CALCIUM 9.4 8.9  --   --  8.5*  --  8.3*  PHOS  --   --  7.3*  --  7.9*  --  5.7*   CBC Recent Labs  Lab 08/08/17 0142  WBC 5.9  NEUTROABS 3.8  HGB 13.2  HCT 38.6*  MCV 97.5  PLT 174    @IMGRELPRIORS @ Medications:    . amLODipine  5 mg Oral Daily  . aspirin EC  81 mg Oral Daily  . calcitRIOL  0.25 mcg Oral Q M,W,F  . carvedilol  25 mg Oral BID WC  . feeding supplement (NEPRO CARB STEADY)  237 mL Oral Q24H  . heparin  5,000 Units Subcutaneous Q8H  . isosorbide-hydrALAZINE  1 tablet Oral TID  . lanthanum  1,000 mg Oral TID WC  . mouth rinse  15 mL Mouth Rinse BID  . multivitamin  1 tablet Oral QHS  . pravastatin  20 mg Oral q1800  . sodium chloride flush  3 mL Intravenous Q12H  . sodium chloride flush  3 mL Intravenous Q12H     Assessment/ Plan:   1. Hyperkalemia- due to noncompliance with HD.  Improved after HD. 2. ESRD back on TTS schedule.  will continue with this until outpatient  schedule has been decided. 3. Anemia: no esa for hgb >12 4. CKD-MBD: hyperphosphatemic started binders, continue with vit D. 5. Nutrition: mild protein malnutrition, renal diet 6. Hypertension: stable with UF 7. Seizure disorder- had seizure with first HD session but none yesterday.  Continue with meds.  Drug screen pending.   8. Disposition- pt is without permanent residence but states that he is staying with his sister.  Will need SW and CM assistance.  Still waiting for outpatient HD spot.    Donetta Potts, MD [place] Pager [phone] 08/11/2017, 12:18 PM

## 2017-08-12 DIAGNOSIS — E875 Hyperkalemia: Secondary | ICD-10-CM

## 2017-08-12 LAB — HEPATITIS B SURFACE ANTIGEN: Hepatitis B Surface Ag: NEGATIVE

## 2017-08-12 NOTE — Progress Notes (Addendum)
[facility] PROGRESS NOTE  George [name] PYP:[id number] DOB: 06-01-1971 DOA: 08/08/2017 PCP: Patient, No Pcp Per  Brief:  47 year old male past medical history of seizures, hemodialysis, hypertension, diabetes and heart failure presented with weakness.  Was found to have hyperkalemia.  He emergently received dialysis.  In the middle treatment patient had a seizure.  Became hypoxic.  Rapid response called.  Patient came around with time and oxygen supplementation.  Sent to stepdown unit.  Loaded with Keppra IV.  Patient improved.  Return to unit for continued dialysis.  Finished treatment without episode.  No seizure since.  Initial EKG showed very prolonged QT.  With correction of electrolytes this is improved.  Cardiac echo done that shows grade 2 diastolic dysfunction.  Patient currently waiting on dialysis chair for discharge.  Assessment/Plan:  Hyperkalemia Resolved after HD MWF Sch  ESRD Nephro following Awaiting HD outpatient set up.   CHF Grad 2 diastolic sys Cont coreg and bidil  ECHO Ef 45 %. Concern for cardiac amyloidosis. Needs follow up with cardiologist   Seizure - seizure precautions -continue with keppra BID - moved from sdu -> tele - IV ativan prn  Acute hypoxic respiratory failure ; Hypoxia 2/2 pulm edema; resolved - likely due to seizure, pt improving - CXR yesterday showed pulm edema, pt still off O2  Hyperkalemia resolved  Hx of CVA Cont ASA and statin  Mild trop/Long QT Likely due to hypoxia and pulm edema ECHO--> Grade 2 dia dysf   Code Status: FC Family Communication:care discussed with patient  Disposition Plan: awaiting HD outpatient arrangement    Consultants:  Neprho  Procedures:  HD  HPI/Subjective: He is feeling ok, denies dyspnea , chest pain   Objective: Vitals:   08/12/17 0443 08/12/17 0857  BP: (!) 157/82 (!) 162/83  Pulse: 67 63  Resp: 20 18  Temp: 97.8 F (36.6 C) 98 F (36.7 C)  SpO2: 93% 98%     Intake/Output Summary (Last 24 hours) at 08/12/2017 1426 Last data filed at 08/12/2017 0857 Gross per 24 hour  Intake 1422 ml  Output 4000 ml  Net -2578 ml   Filed Weights   08/11/17 1222 08/11/17 1639 08/11/17 2107  Weight: 99.7 kg (219 lb 12.8 oz) 96.5 kg (212 lb 11.9 oz) 96.5 kg (212 lb 11.9 oz)    Exam:   General:  NAD  Cardiovascular; S 1, S 2 RRR  Respiratory: CTA  Abdomen: BS present, soft, nt  Musculoskeletal: moving all 4 extremities.   Data Reviewed: Basic Metabolic Panel: Recent Labs  Lab 08/08/17 0142 08/08/17 1136 08/08/17 1829 08/09/17 0003 08/09/17 0402 08/09/17 0914 08/10/17 1512 08/11/17 0614  NA 140 139  --   --  138  --  135 136  K >7.5* 4.0  4.0 4.3 4.7 4.6 4.5 4.1 4.4  CL 100* 96*  --   --  97*  --  98* 97*  CO2 18* 19*  --   --  22  --  24 22  GLUCOSE 62* 81  --   --  126*  --  87 90  BUN 110* 56*  --   --  67*  --  39* 49*  CREATININE 20.27* 13.03*  --   --  14.83*  --  11.12* 12.44*  CALCIUM 9.4 8.9  --   --  8.5*  --  8.3* 8.8*  MG  --   --  2.8*  --   --   --   --   --  PHOS  --   --  7.3*  --  7.9*  --  5.7* 7.0*   Liver Function Tests: Recent Labs  Lab 08/09/17 0402 08/10/17 1512 08/11/17 0614  ALBUMIN 2.9* 2.8* 3.1*   No results for input(s): LIPASE, AMYLASE in the last 168 hours. No results for input(s): AMMONIA in the last 168 hours. CBC: Recent Labs  Lab 08/08/17 0142 08/11/17 0614  WBC 5.9 5.5  NEUTROABS 3.8  --   HGB 13.2 11.5*  HCT 38.6* 34.2*  MCV 97.5 97.2  PLT 174 149*   Cardiac Enzymes: Recent Labs  Lab 08/08/17 0142 08/08/17 0402 08/08/17 1136 08/08/17 1829  TROPONINI 0.05* 0.05* 0.06* 0.15*   BNP (last 3 results) No results for input(s): BNP in the last 8760 hours.  ProBNP (last 3 results) No results for input(s): PROBNP in the last 8760 hours.  CBG: Recent Labs  Lab 08/08/17 0418 08/08/17 1027 08/08/17 1208 08/09/17 1619  GLUCAP 89 87 77 104*    Recent Results (from the past  240 hour(s))  MRSA PCR Screening     Status: None   Collection Time: 08/08/17 12:03 PM  Result Value Ref Range Status   MRSA by PCR NEGATIVE NEGATIVE Final    Comment:        The GeneXpert MRSA Assay (FDA approved for NASAL specimens only), is one component of a comprehensive MRSA colonization surveillance program. It is not intended to diagnose MRSA infection nor to guide or monitor treatment for MRSA infections. Performed at [facility], [place] [address] [place], [place] [address]      Studies: No results found.  Scheduled Meds: . amLODipine  5 mg Oral Daily  . aspirin EC  81 mg Oral Daily  . calcitRIOL  0.25 mcg Oral Q M,W,F  . carvedilol  25 mg Oral BID WC  . feeding supplement (NEPRO CARB STEADY)  237 mL Oral Q24H  . heparin  5,000 Units Subcutaneous Q8H  . isosorbide-hydrALAZINE  1 tablet Oral TID  . lanthanum  1,000 mg Oral TID WC  . levETIRAcetam  500 mg Oral BID  . mouth rinse  15 mL Mouth Rinse BID  . multivitamin  1 tablet Oral QHS  . pravastatin  20 mg Oral q1800  . sodium chloride flush  3 mL Intravenous Q12H  . sodium chloride flush  3 mL Intravenous Q12H   Continuous Infusions: . sodium chloride      Principal Problem:   Hyperkalemia Active Problems:   Acute respiratory failure (HCC)   Essential hypertension   High anion gap metabolic acidosis   ESRD needing dialysis ([place])   Type 2 diabetes mellitus with complication (HCC)   Hemiparesis affecting left side as late effect of stroke (HCC)   Hypertensive urgency   Elevated troponin   Acute pulmonary edema ([place])   Seizure ([place])    Time spent: 35    Sheron Robin A Jamy Whyte  [facility] Pager AMION. If 7PM-7AM, please contact night-coverage at [email or web address], password [facility] 08/12/2017, 2:26 PM  LOS: 4 days

## 2017-08-12 NOTE — Progress Notes (Signed)
Per CMA: Memory Argue         # 5.  Patient Has : MEDICAID OF [place]      EFF-DATE : 07/31/2017      CO-PAY- $ 3.70 FOR EACH RX    ----- Message -----  From: Kristen Cardinal, RN  Sent: 08/12/2017  1:51 PM  To: [facility] Ccm Case Mgr Assistant  Subject: Benefit check                   Please do benefit check for Lanthanum 1000 mg three times daily. Thank you      Priscille Heidelberg, Rn Nurse case manager [phone]

## 2017-08-12 NOTE — Progress Notes (Signed)
  [facility] Progress Note    Subjective:   No complaints   Objective:   BP (!) 162/83 (BP Location: Right Arm)   Pulse 63   Temp 98 F (36.7 C) (Oral)   Resp 18   Ht 6' (1.829 m)   Wt 96.5 kg (212 lb 11.9 oz)   SpO2 98%   BMI 28.85 kg/m   Intake/Output: I/O last 3 completed shifts: In: 4098 [P.O.:1242] Out: 4000 [Other:4000]   Intake/Output this shift:  Total I/O In: 480 [P.O.:480] Out: 0  Weight change: 3 kg (6 lb 9.8 oz)  Physical Exam: Gen: NAD  CVS:no rub Resp: cta Abd: benign Ext: no edema, LUE AVF +T/B  Labs: BMET Recent Labs  Lab 08/08/17 0142 08/08/17 1136 08/08/17 1829 08/09/17 0003 08/09/17 0402 08/09/17 0914 08/10/17 1512 08/11/17 0614  NA 140 139  --   --  138  --  135 136  K >7.5* 4.0  4.0 4.3 4.7 4.6 4.5 4.1 4.4  CL 100* 96*  --   --  97*  --  98* 97*  CO2 18* 19*  --   --  22  --  24 22  GLUCOSE 62* 81  --   --  126*  --  87 90  BUN 110* 56*  --   --  67*  --  39* 49*  CREATININE 20.27* 13.03*  --   --  14.83*  --  11.12* 12.44*  ALBUMIN  --   --   --   --  2.9*  --  2.8* 3.1*  CALCIUM 9.4 8.9  --   --  8.5*  --  8.3* 8.8*  PHOS  --   --  7.3*  --  7.9*  --  5.7* 7.0*   CBC Recent Labs  Lab 08/08/17 0142 08/11/17 0614  WBC 5.9 5.5  NEUTROABS 3.8  --   HGB 13.2 11.5*  HCT 38.6* 34.2*  MCV 97.5 97.2  PLT 174 149*    @IMGRELPRIORS @ Medications:    . amLODipine  5 mg Oral Daily  . aspirin EC  81 mg Oral Daily  . calcitRIOL  0.25 mcg Oral Q M,W,F  . carvedilol  25 mg Oral BID WC  . feeding supplement (NEPRO CARB STEADY)  237 mL Oral Q24H  . heparin  5,000 Units Subcutaneous Q8H  . isosorbide-hydrALAZINE  1 tablet Oral TID  . lanthanum  1,000 mg Oral TID WC  . levETIRAcetam  500 mg Oral BID  . mouth rinse  15 mL Mouth Rinse BID  . multivitamin  1 tablet Oral QHS  . pravastatin  20 mg Oral q1800  . sodium chloride flush  3 mL Intravenous Q12H  . sodium chloride flush  3 mL Intravenous Q12H      Assessment/ Plan:   1. Hyperkalemia- due to noncompliance with HD. Improved after HD. 2. ESRDback on TTS schedule. will continue with this until outpatient schedule has been decided. 3. Anemia:no esa for hgb >12 4. CKD-MBD:hyperphosphatemic started binders, continue with vit D. 5. Nutrition:mild protein malnutrition, renal diet 6. Hypertension:stable with UF 7. Seizure disorder- had seizure with first HD session but none yesterday. Continue with meds. Drug screen pending.  8. Disposition- pt is without permanent residence but states that he is staying with his sister. Will need SW and CM assistance. Still waiting for outpatient HD spot.   Donetta Potts, MD [place] Pager [phone] 08/12/2017, 12:56 PM

## 2017-08-12 NOTE — Progress Notes (Signed)
Call placed to [facility] on [address], spoke with [facility] states they have no record of refill request for phosphate finders for patient on file.  Also contacted [facility] on [address] and patient is not found in system.    Priscille Heidelberg, BSN, RN Nurse case manager [phone]

## 2017-08-12 NOTE — Care Management Important Message (Signed)
Important Message  Patient Details  Name: George [name] MRN: [id number] Date of Birth: 1971-01-17   Medicare Important Message Given:  Yes    Revere Maahs 08/12/2017, 1:09 PM

## 2017-08-13 LAB — CBC
HEMATOCRIT: 31.2 % — AB (ref 39.0–52.0)
Hemoglobin: 10.7 g/dL — ABNORMAL LOW (ref 13.0–17.0)
MCH: 33 pg (ref 26.0–34.0)
MCHC: 34.3 g/dL (ref 30.0–36.0)
MCV: 96.3 fL (ref 78.0–100.0)
PLATELETS: 113 10*3/uL — AB (ref 150–400)
RBC: 3.24 MIL/uL — AB (ref 4.22–5.81)
RDW: 15.9 % — AB (ref 11.5–15.5)
WBC: 4.5 10*3/uL (ref 4.0–10.5)

## 2017-08-13 LAB — RENAL FUNCTION PANEL
ALBUMIN: 2.9 g/dL — AB (ref 3.5–5.0)
Anion gap: 14 (ref 5–15)
BUN: 41 mg/dL — AB (ref 6–20)
CHLORIDE: 96 mmol/L — AB (ref 101–111)
CO2: 24 mmol/L (ref 22–32)
CREATININE: 10.63 mg/dL — AB (ref 0.61–1.24)
Calcium: 8 mg/dL — ABNORMAL LOW (ref 8.9–10.3)
GFR, EST AFRICAN AMERICAN: 6 mL/min — AB (ref >60)
GFR, EST NON AFRICAN AMERICAN: 5 mL/min — AB (ref >60)
Glucose, Bld: 118 mg/dL — ABNORMAL HIGH (ref 65–99)
PHOSPHORUS: 6.8 mg/dL — AB (ref 2.5–4.6)
POTASSIUM: 4.4 mmol/L (ref 3.5–5.1)
Sodium: 134 mmol/L — ABNORMAL LOW (ref 135–145)

## 2017-08-13 NOTE — Progress Notes (Signed)
Called by CCMD that patient's telemetry is off. When checked on patient,he has his tele off and is on the bed. Informed patient that I  have to place telemetry back on,pt states "I'm taking a shower". Informed patient that I have to ask MD first because he's on heart monitor. Patient refused to be placed on  tele adamant on taking a shower.   Encouraged to come out of shower to have tele placed back on

## 2017-08-13 NOTE — Procedures (Signed)
I was present at this dialysis session. I have reviewed the session itself and made appropriate changes.   Filed Weights   08/11/17 1639 08/11/17 2107 08/13/17 0445  Weight: 96.5 kg (212 lb 11.9 oz) 96.5 kg (212 lb 11.9 oz) 97.1 kg (214 lb 1.1 oz)    Recent Labs  Lab 08/11/17 0614  NA 136  K 4.4  CL 97*  CO2 22  GLUCOSE 90  BUN 49*  CREATININE 12.44*  CALCIUM 8.8*  PHOS 7.0*    Recent Labs  Lab 08/08/17 0142 08/11/17 0614  WBC 5.9 5.5  NEUTROABS 3.8  --   HGB 13.2 11.5*  HCT 38.6* 34.2*  MCV 97.5 97.2  PLT 174 149*    Scheduled Meds: . amLODipine  5 mg Oral Daily  . aspirin EC  81 mg Oral Daily  . calcitRIOL  0.25 mcg Oral Q M,W,F  . carvedilol  25 mg Oral BID WC  . feeding supplement (NEPRO CARB STEADY)  237 mL Oral Q24H  . heparin  5,000 Units Subcutaneous Q8H  . isosorbide-hydrALAZINE  1 tablet Oral TID  . lanthanum  1,000 mg Oral TID WC  . levETIRAcetam  500 mg Oral BID  . mouth rinse  15 mL Mouth Rinse BID  . multivitamin  1 tablet Oral QHS  . pravastatin  20 mg Oral q1800  . sodium chloride flush  3 mL Intravenous Q12H  . sodium chloride flush  3 mL Intravenous Q12H   Continuous Infusions: . sodium chloride     PRN Meds:.sodium chloride, acetaminophen **OR** acetaminophen, albuterol, hydrALAZINE, HYDROcodone-acetaminophen, labetalol, LORazepam, ondansetron **OR** ondansetron (ZOFRAN) IV, senna-docusate, sodium chloride flush   Still waiting for insurance approval so he can establish outpatient HD. Donetta Potts,  MD 08/13/2017, 7:59 AM

## 2017-08-13 NOTE — Progress Notes (Signed)
[facility] PROGRESS NOTE  George [name] JAS:[id number] DOB: 02-13-1971 DOA: 08/08/2017 PCP: Patient, No Pcp Per  Brief:  47 year old male past medical history of seizures, hemodialysis, hypertension, diabetes and heart failure presented with weakness.  Was found to have hyperkalemia.  He emergently received dialysis.  In the middle treatment patient had a seizure.  Became hypoxic.  Rapid response called.  Patient came around with time and oxygen supplementation.  Sent to stepdown unit.  Loaded with Keppra IV.  Patient improved.  Return to unit for continued dialysis.  Finished treatment without episode.  No seizure since.  Initial EKG showed very prolonged QT.  With correction of electrolytes this is improved.  Cardiac echo done that shows grade 2 diastolic dysfunction.  Patient currently waiting on dialysis chair for discharge.  Assessment/Plan:  Hyperkalemia Resolved after HD MWF Sch  ESRD Nephro following Awaiting HD outpatient set up.   CHF Grad 2 diastolic sys Cont coreg and bidil  ECHO Ef 45 %. Concern for cardiac amyloidosis. Needs follow up with cardiologist   Seizure - seizure precautions -continue with keppra BID - moved from sdu -> tele - IV ativan prn  Acute hypoxic respiratory failure ; Hypoxia 2/2 pulm edema; resolved - likely due to seizure, pt improving - CXR yesterday showed pulm edema, pt still off O2  Hyperkalemia resolved  Hx of CVA Cont ASA and statin  Mild trop/Long QT Likely due to hypoxia and pulm edema ECHO--> Grade 2 dia dysf   Code Status: FC Family Communication:care discussed with patient  Disposition Plan: awaiting HD outpatient arrangement . Discussed with SW, patient needs to have his insurance change to [place]>    Consultants:  Neprho  Procedures:  HD  HPI/Subjective: Seeing in HD>  He denies chest pain, dyspnea.   Objective: Vitals:   08/13/17 1130 08/13/17 1145  BP: (!) 179/92 (!) 180/107  Pulse: 75 74  Resp: 16 16   Temp:  98.7 F (37.1 C)  SpO2:      Intake/Output Summary (Last 24 hours) at 08/13/2017 1414 Last data filed at 08/13/2017 1134 Gross per 24 hour  Intake 0 ml  Output 4100 ml  Net -4100 ml   Filed Weights   08/13/17 0445 08/13/17 0705 08/13/17 1145  Weight: 97.1 kg (214 lb 1.1 oz) 98.8 kg (217 lb 13 oz) 94.5 kg (208 lb 5.4 oz)    Exam:   General:  NAD  Cardiovascular; S 1, S 2 RRR  Respiratory: CTA  Abdomen: BS present, soft, nt  Musculoskeletal: Moves all 4 extremities,   Data Reviewed: Basic Metabolic Panel: Recent Labs  Lab 08/08/17 1136 08/08/17 1829  08/09/17 0402 08/09/17 0914 08/10/17 1512 08/11/17 0614 08/13/17 0740  NA 139  --   --  138  --  135 136 134*  K 4.0  4.0 4.3   < > 4.6 4.5 4.1 4.4 4.4  CL 96*  --   --  97*  --  98* 97* 96*  CO2 19*  --   --  22  --  24 22 24   GLUCOSE 81  --   --  126*  --  87 90 118*  BUN 56*  --   --  67*  --  39* 49* 41*  CREATININE 13.03*  --   --  14.83*  --  11.12* 12.44* 10.63*  CALCIUM 8.9  --   --  8.5*  --  8.3* 8.8* 8.0*  MG  --  2.8*  --   --   --   --   --   --  PHOS  --  7.3*  --  7.9*  --  5.7* 7.0* 6.8*   < > = values in this interval not displayed.   Liver Function Tests: Recent Labs  Lab 08/09/17 0402 08/10/17 1512 08/11/17 0614 08/13/17 0740  ALBUMIN 2.9* 2.8* 3.1* 2.9*   No results for input(s): LIPASE, AMYLASE in the last 168 hours. No results for input(s): AMMONIA in the last 168 hours. CBC: Recent Labs  Lab 08/08/17 0142 08/11/17 0614 08/13/17 0740  WBC 5.9 5.5 4.5  NEUTROABS 3.8  --   --   HGB 13.2 11.5* 10.7*  HCT 38.6* 34.2* 31.2*  MCV 97.5 97.2 96.3  PLT 174 149* 113*   Cardiac Enzymes: Recent Labs  Lab 08/08/17 0142 08/08/17 0402 08/08/17 1136 08/08/17 1829  TROPONINI 0.05* 0.05* 0.06* 0.15*   BNP (last 3 results) No results for input(s): BNP in the last 8760 hours.  ProBNP (last 3 results) No results for input(s): PROBNP in the last 8760 hours.  CBG: Recent  Labs  Lab 08/08/17 0418 08/08/17 1027 08/08/17 1208 08/09/17 1619  GLUCAP 89 87 77 104*    Recent Results (from the past 240 hour(s))  MRSA PCR Screening     Status: None   Collection Time: 08/08/17 12:03 PM  Result Value Ref Range Status   MRSA by PCR NEGATIVE NEGATIVE Final    Comment:        The GeneXpert MRSA Assay (FDA approved for NASAL specimens only), is one component of a comprehensive MRSA colonization surveillance program. It is not intended to diagnose MRSA infection nor to guide or monitor treatment for MRSA infections. Performed at [facility], [place] [address] [place], [place] [address]      Studies: No results found.  Scheduled Meds: . amLODipine  5 mg Oral Daily  . aspirin EC  81 mg Oral Daily  . calcitRIOL  0.25 mcg Oral Q M,W,F  . carvedilol  25 mg Oral BID WC  . feeding supplement (NEPRO CARB STEADY)  237 mL Oral Q24H  . heparin  5,000 Units Subcutaneous Q8H  . isosorbide-hydrALAZINE  1 tablet Oral TID  . lanthanum  1,000 mg Oral TID WC  . levETIRAcetam  500 mg Oral BID  . mouth rinse  15 mL Mouth Rinse BID  . multivitamin  1 tablet Oral QHS  . pravastatin  20 mg Oral q1800  . sodium chloride flush  3 mL Intravenous Q12H  . sodium chloride flush  3 mL Intravenous Q12H   Continuous Infusions: . sodium chloride      Principal Problem:   Hyperkalemia Active Problems:   Acute respiratory failure (HCC)   Essential hypertension   High anion gap metabolic acidosis   ESRD needing dialysis ([place])   Type 2 diabetes mellitus with complication (HCC)   Hemiparesis affecting left side as late effect of stroke (HCC)   Hypertensive urgency   Elevated troponin   Acute pulmonary edema ([place])   Seizure ([place])    Time spent: 35    George [name]  [facility] Pager AMION. If 7PM-7AM, please contact night-coverage at [email or web address], password [facility] 08/13/2017, 2:14 PM  LOS: 5 days

## 2017-08-13 NOTE — Care Management Note (Signed)
Case Management Note  Patient Details  Name: Lior Hoen MRN: [id number] Date of Birth: October 06, 1970  Subjective/Objective:     Status of Benefit transfer to [place].              Action/Plan: Placed call to financial counselor and spoke with "Vivien Rota" states patient active with both [place] Medicare/[place] Medicaid.  No additional action at this time.  Expected Discharge Date:                  Expected Discharge Plan:  Home/Self Care  In-House Referral:  Clinical Social Work, Financial Counselor(Hemodialysis clipping process)  Discharge planning Services  CM Consult(Establishing PCP)  Status of Service:  Completed, signed off  Kristen Cardinal, RN  Nurse case management [phone]  08/13/2017, 12:23 PM

## 2017-08-14 LAB — PARATHYROID HORMONE, INTACT (NO CA): PTH: 371 pg/mL — AB (ref 15–65)

## 2017-08-14 NOTE — Progress Notes (Signed)
[facility] Progress Note   Subjective: Awake, alert, no C/Os. Wants to know when he can go home.   Objective Vitals:   08/13/17 1755 08/13/17 2130 08/14/17 0536 08/14/17 0851  BP: (!) 167/88 (!) 148/64 135/66 (!) 143/73  Pulse: 75 78 67 67  Resp: 18 17 16 16   Temp: 98 F (36.7 C) 98.5 F (36.9 C) 98.6 F (37 C) 98.3 F (36.8 C)  TempSrc: Oral Oral Oral Oral  SpO2: 100% 100% 94% 95%  Weight:  94.8 kg (208 lb 15.9 oz)    Height:       Physical Exam General: disheveled, chronically ill appearing male in NAD Heart: S1,S2, + S4 cardiac heave noted.  Lungs: CTAB Abdomen: Active BS Extremities: No LE edema Dialysis Access: L AVF aneurysmal, + bruit   Additional Objective Labs: Basic Metabolic Panel: Recent Labs  Lab 08/10/17 1512 08/11/17 0614 08/13/17 0740  NA 135 136 134*  K 4.1 4.4 4.4  CL 98* 97* 96*  CO2 24 22 24   GLUCOSE 87 90 118*  BUN 39* 49* 41*  CREATININE 11.12* 12.44* 10.63*  CALCIUM 8.3* 8.8* 8.0*  PHOS 5.7* 7.0* 6.8*   Liver Function Tests: Recent Labs  Lab 08/10/17 1512 08/11/17 0614 08/13/17 0740  ALBUMIN 2.8* 3.1* 2.9*   No results for input(s): LIPASE, AMYLASE in the last 168 hours. CBC: Recent Labs  Lab 08/08/17 0142 08/11/17 0614 08/13/17 0740  WBC 5.9 5.5 4.5  NEUTROABS 3.8  --   --   HGB 13.2 11.5* 10.7*  HCT 38.6* 34.2* 31.2*  MCV 97.5 97.2 96.3  PLT 174 149* 113*   Blood Culture No results found for: SDES, SPECREQUEST, CULT, REPTSTATUS  Cardiac Enzymes: Recent Labs  Lab 08/08/17 0142 08/08/17 0402 08/08/17 1136 08/08/17 1829  TROPONINI 0.05* 0.05* 0.06* 0.15*   CBG: Recent Labs  Lab 08/08/17 0418 08/08/17 1027 08/08/17 1208 08/09/17 1619  GLUCAP 89 87 77 104*   Iron Studies: No results for input(s): IRON, TIBC, TRANSFERRIN, FERRITIN in the last 72 hours. @lablastinr3 @ Studies/Results: No results found. Medications: . sodium chloride     . amLODipine  5 mg Oral Daily  . aspirin EC  81  mg Oral Daily  . calcitRIOL  0.25 mcg Oral Q M,W,F  . carvedilol  25 mg Oral BID WC  . feeding supplement (NEPRO CARB STEADY)  237 mL Oral Q24H  . heparin  5,000 Units Subcutaneous Q8H  . isosorbide-hydrALAZINE  1 tablet Oral TID  . lanthanum  1,000 mg Oral TID WC  . levETIRAcetam  500 mg Oral BID  . mouth rinse  15 mL Mouth Rinse BID  . multivitamin  1 tablet Oral QHS  . pravastatin  20 mg Oral q1800  . sodium chloride flush  3 mL Intravenous Q12H  . sodium chloride flush  3 mL Intravenous Q12H     HD orders: No Center yet-relocated from [place] without procuring HD Center T,Th,S schedule for now  Assessment/Plan: 1. Hyperkalemia 2/2 noncompliance with HD. Resolved with HD. Last K+ 4.4 2. ESRD - T,Th,S. Needs OP HD center. Next HD tomorrow.  3. Anemia - HGB 10.7 No ESA needed. Follow trend.  4. Secondary hyperparathyroidism - Ca 8.0 C Ca 8.8 Phos 6.8. Cont VDRA, binders 5. HTN/volume -Better BP control. Continue current meds. HD 08/13/17 Pre wt 98.8 Net UF 4100 Post wt 94.5 kg. Nadir wt appears to be 94.5 kg. Continue lowering EDW as tolerated.  6. Nutrition - Albumin 2.9 renal diet, add  renal vit, nepro 7. Seizures disorder: On Keppra per primary.  8. H/O CVA 9. Grade 2 diastolic dysfunction-monitor volume.    George H. Brown NP-C 08/14/2017, 9:46 AM  [employer] [phone]  I have seen and examined this patient and agree with plan and assessment in the above note with renal recommendations/intervention highlighted.  Awaiting outpatient HD arrangements. George John A Billijo Dilling,MD 08/14/2017 4:04 PM

## 2017-08-14 NOTE — Progress Notes (Signed)
[facility] PROGRESS NOTE  George [name] QMV:[id number] DOB: 04/18/1971 DOA: 08/08/2017 PCP: Patient, No Pcp Per  Brief:  47 year old male past medical history of seizures, hemodialysis, hypertension, diabetes and heart failure presented with weakness.  Was found to have hyperkalemia.  He emergently received dialysis.  In the middle treatment patient had a seizure.  Became hypoxic.  Rapid response called.  Patient came around with time and oxygen supplementation.  Sent to stepdown unit.  Loaded with Keppra IV.  Patient improved.  Return to unit for continued dialysis.  Finished treatment without episode.  No seizure since.  Initial EKG showed very prolonged QT.  With correction of electrolytes this is improved.  Cardiac echo done that shows grade 2 diastolic dysfunction.  Patient currently waiting on dialysis chair for discharge.  Assessment/Plan:  Hyperkalemia Resolved after HD MWF Sch  ESRD Nephro following Awaiting HD outpatient set up.   CHF Grad 2 diastolic sys Cont coreg and bidil  ECHO Ef 45 %. Concern for cardiac amyloidosis. Needs follow up with cardiologist   Seizure - seizure precautions -continue with keppra BID - moved from sdu -> tele - IV ativan prn  Acute hypoxic respiratory failure ; Hypoxia 2/2 pulm edema; resolved - likely due to seizure, pt improving Stable.   Hyperkalemia resolved  Hx of CVA Cont ASA and statin  Mild trop/Long QT Likely due to hypoxia and pulm edema ECHO--> Grade 2 dia dysf   Code Status: FC Family Communication:care discussed with patient  Disposition Plan: awaiting HD outpatient arrangement . Discussed with SW, patient needs to have his insurance change to [place]>    Consultants:  Neprho  Procedures:  HD  HPI/Subjective: No new complaints. Denies pain. Denies dyspnea.   Objective: Vitals:   08/14/17 0536 08/14/17 0851  BP: 135/66 (!) 143/73  Pulse: 67 67  Resp: 16 16  Temp: 98.6 F (37 C) 98.3 F (36.8 C)   SpO2: 94% 95%    Intake/Output Summary (Last 24 hours) at 08/14/2017 1409 Last data filed at 08/14/2017 0851 Gross per 24 hour  Intake 480 ml  Output 0 ml  Net 480 ml   Filed Weights   08/13/17 0705 08/13/17 1145 08/13/17 2130  Weight: 98.8 kg (217 lb 13 oz) 94.5 kg (208 lb 5.4 oz) 94.8 kg (208 lb 15.9 oz)    Exam:   General:  NAD  Cardiovascular; S 1, S 2 RRR  Respiratory: CTA  Abdomen: BS present, soft, nt  Musculoskeletal: Moves all 4 extremities.   Data Reviewed: Basic Metabolic Panel: Recent Labs  Lab 08/08/17 1136 08/08/17 1829  08/09/17 0402 08/09/17 0914 08/10/17 1512 08/11/17 0614 08/13/17 0740  NA 139  --   --  138  --  135 136 134*  K 4.0  4.0 4.3   < > 4.6 4.5 4.1 4.4 4.4  CL 96*  --   --  97*  --  98* 97* 96*  CO2 19*  --   --  22  --  24 22 24   GLUCOSE 81  --   --  126*  --  87 90 118*  BUN 56*  --   --  67*  --  39* 49* 41*  CREATININE 13.03*  --   --  14.83*  --  11.12* 12.44* 10.63*  CALCIUM 8.9  --   --  8.5*  --  8.3* 8.8* 8.0*  MG  --  2.8*  --   --   --   --   --   --  PHOS  --  7.3*  --  7.9*  --  5.7* 7.0* 6.8*   < > = values in this interval not displayed.   Liver Function Tests: Recent Labs  Lab 08/09/17 0402 08/10/17 1512 08/11/17 0614 08/13/17 0740  ALBUMIN 2.9* 2.8* 3.1* 2.9*   No results for input(s): LIPASE, AMYLASE in the last 168 hours. No results for input(s): AMMONIA in the last 168 hours. CBC: Recent Labs  Lab 08/08/17 0142 08/11/17 0614 08/13/17 0740  WBC 5.9 5.5 4.5  NEUTROABS 3.8  --   --   HGB 13.2 11.5* 10.7*  HCT 38.6* 34.2* 31.2*  MCV 97.5 97.2 96.3  PLT 174 149* 113*   Cardiac Enzymes: Recent Labs  Lab 08/08/17 0142 08/08/17 0402 08/08/17 1136 08/08/17 1829  TROPONINI 0.05* 0.05* 0.06* 0.15*   BNP (last 3 results) No results for input(s): BNP in the last 8760 hours.  ProBNP (last 3 results) No results for input(s): PROBNP in the last 8760 hours.  CBG: Recent Labs  Lab  08/08/17 0418 08/08/17 1027 08/08/17 1208 08/09/17 1619  GLUCAP 89 87 77 104*    Recent Results (from the past 240 hour(s))  MRSA PCR Screening     Status: None   Collection Time: 08/08/17 12:03 PM  Result Value Ref Range Status   MRSA by PCR NEGATIVE NEGATIVE Final    Comment:        The GeneXpert MRSA Assay (FDA approved for NASAL specimens only), is one component of a comprehensive MRSA colonization surveillance program. It is not intended to diagnose MRSA infection nor to guide or monitor treatment for MRSA infections. Performed at [facility], [place] [address] [place], [place] [address]      Studies: No results found.  Scheduled Meds: . amLODipine  5 mg Oral Daily  . aspirin EC  81 mg Oral Daily  . calcitRIOL  0.25 mcg Oral Q M,W,F  . carvedilol  25 mg Oral BID WC  . feeding supplement (NEPRO CARB STEADY)  237 mL Oral Q24H  . heparin  5,000 Units Subcutaneous Q8H  . isosorbide-hydrALAZINE  1 tablet Oral TID  . lanthanum  1,000 mg Oral TID WC  . levETIRAcetam  500 mg Oral BID  . mouth rinse  15 mL Mouth Rinse BID  . multivitamin  1 tablet Oral QHS  . pravastatin  20 mg Oral q1800  . sodium chloride flush  3 mL Intravenous Q12H  . sodium chloride flush  3 mL Intravenous Q12H   Continuous Infusions: . sodium chloride      Principal Problem:   Hyperkalemia Active Problems:   Acute respiratory failure (HCC)   Essential hypertension   High anion gap metabolic acidosis   ESRD needing dialysis ([place])   Type 2 diabetes mellitus with complication (HCC)   Hemiparesis affecting left side as late effect of stroke (HCC)   Hypertensive urgency   Elevated troponin   Acute pulmonary edema ([place])   Seizure ([place])    Time spent: 35    George [name] A George [name]  [facility] Pager AMION. If 7PM-7AM, please contact night-coverage at [email or web address], password [name] 08/14/2017, 2:09 PM  LOS: 6 days

## 2017-08-15 LAB — RENAL FUNCTION PANEL
Albumin: 2.9 g/dL — ABNORMAL LOW (ref 3.5–5.0)
Anion gap: 17 — ABNORMAL HIGH (ref 5–15)
BUN: 45 mg/dL — ABNORMAL HIGH (ref 6–20)
CO2: 22 mmol/L (ref 22–32)
Calcium: 8.7 mg/dL — ABNORMAL LOW (ref 8.9–10.3)
Chloride: 95 mmol/L — ABNORMAL LOW (ref 101–111)
Creatinine, Ser: 10.45 mg/dL — ABNORMAL HIGH (ref 0.61–1.24)
GFR calc Af Amer: 6 mL/min — ABNORMAL LOW (ref >60)
GFR calc non Af Amer: 5 mL/min — ABNORMAL LOW (ref >60)
Glucose, Bld: 105 mg/dL — ABNORMAL HIGH (ref 65–99)
Phosphorus: 6.9 mg/dL — ABNORMAL HIGH (ref 2.5–4.6)
Potassium: 4.2 mmol/L (ref 3.5–5.1)
Sodium: 134 mmol/L — ABNORMAL LOW (ref 135–145)

## 2017-08-15 LAB — CBC
HCT: 31.7 % — ABNORMAL LOW (ref 39.0–52.0)
Hemoglobin: 10.9 g/dL — ABNORMAL LOW (ref 13.0–17.0)
MCH: 33.3 pg (ref 26.0–34.0)
MCHC: 34.4 g/dL (ref 30.0–36.0)
MCV: 96.9 fL (ref 78.0–100.0)
Platelets: 112 K/uL — ABNORMAL LOW (ref 150–400)
RBC: 3.27 MIL/uL — ABNORMAL LOW (ref 4.22–5.81)
RDW: 15.8 % — ABNORMAL HIGH (ref 11.5–15.5)
WBC: 5.4 10*3/uL (ref 4.0–10.5)

## 2017-08-15 MED ORDER — PENTAFLUOROPROP-TETRAFLUOROETH EX AERO: 1.0000 "application " | INHALATION_SPRAY | CUTANEOUS | Status: DC | PRN

## 2017-08-15 MED ORDER — SODIUM CHLORIDE 0.9 % IV SOLN: 100.0000 mL | INTRAVENOUS | Status: DC | PRN

## 2017-08-15 MED ORDER — ALTEPLASE 2 MG IJ SOLR: 2.0000 mg | Freq: Once | INTRAMUSCULAR | Status: DC | PRN

## 2017-08-15 MED ORDER — LIDOCAINE-PRILOCAINE 2.5-2.5 % EX CREA: 1.0000 "application " | TOPICAL_CREAM | CUTANEOUS | Status: DC | PRN

## 2017-08-15 MED ORDER — ZOLPIDEM TARTRATE 5 MG PO TABS
5.0000 mg | ORAL_TABLET | Freq: Every evening | ORAL | Status: DC | PRN
  Administered 2017-08-15 – 2017-08-16 (×2): 5 mg via ORAL
  Filled 2017-08-15 (×2): qty 1

## 2017-08-15 MED ORDER — LIDOCAINE HCL (PF) 1 % IJ SOLN: 5.0000 mL | INTRAMUSCULAR | Status: DC | PRN

## 2017-08-15 MED ORDER — HEPARIN SODIUM (PORCINE) 1000 UNIT/ML DIALYSIS: 4000.0000 [IU] | Freq: Once | INTRAMUSCULAR | Status: DC

## 2017-08-15 MED ORDER — HEPARIN SODIUM (PORCINE) 1000 UNIT/ML DIALYSIS: 1000.0000 [IU] | INTRAMUSCULAR | Status: DC | PRN

## 2017-08-15 NOTE — Progress Notes (Signed)
[facility] Progress Note   Subjective: Seen on HD. UF goal 5L. Tolerating well, so far. No c/os. Denies CP, SOB.   Objective Vitals:   08/14/17 0851 08/14/17 1828 08/14/17 2005 08/15/17 0706  BP: (!) 143/73 131/68 133/89 (!) (P) 154/87  Pulse: 67 71 70   Resp: 16 16 16    Temp: 98.3 F (36.8 C) 98.3 F (36.8 C) 98.5 F (36.9 C)   TempSrc: Oral Oral Oral   SpO2: 95% 95% 93%   Weight:      Height:       Physical Exam General: disheveled, chronically ill appearing male in NAD Heart: S1,S2, + S4 cardiac heave noted.  Lungs: CTAB Abdomen: Active BS Extremities: No LE edema Dialysis Access: L AVF aneurysmal -cannulated on HD    Additional Objective Labs: Basic Metabolic Panel: Recent Labs  Lab 08/10/17 1512 08/11/17 0614 08/13/17 0740  NA 135 136 134*  K 4.1 4.4 4.4  CL 98* 97* 96*  CO2 24 22 24   GLUCOSE 87 90 118*  BUN 39* 49* 41*  CREATININE 11.12* 12.44* 10.63*  CALCIUM 8.3* 8.8* 8.0*  PHOS 5.7* 7.0* 6.8*   Liver Function Tests: Recent Labs  Lab 08/10/17 1512 08/11/17 0614 08/13/17 0740  ALBUMIN 2.8* 3.1* 2.9*   No results for input(s): LIPASE, AMYLASE in the last 168 hours. CBC: Recent Labs  Lab 08/11/17 0614 08/13/17 0740 08/15/17 0718  WBC 5.5 4.5 5.4  HGB 11.5* 10.7* 10.9*  HCT 34.2* 31.2* 31.7*  MCV 97.2 96.3 96.9  PLT 149* 113* 112*   Blood Culture No results found for: SDES, SPECREQUEST, CULT, REPTSTATUS  Cardiac Enzymes: Recent Labs  Lab 08/08/17 1136 08/08/17 1829  TROPONINI 0.06* 0.15*   CBG: Recent Labs  Lab 08/08/17 1027 08/08/17 1208 08/09/17 1619  GLUCAP 87 77 104*   Iron Studies: No results for input(s): IRON, TIBC, TRANSFERRIN, FERRITIN in the last 72 hours. @lablastinr3 @ Studies/Results: No results found. Medications: . sodium chloride    . sodium chloride    . sodium chloride     . amLODipine  5 mg Oral Daily  . aspirin EC  81 mg Oral Daily  . calcitRIOL  0.25 mcg Oral Q M,W,F  .  carvedilol  25 mg Oral BID WC  . feeding supplement (NEPRO CARB STEADY)  237 mL Oral Q24H  . heparin  4,000 Units Dialysis Once in dialysis  . heparin  5,000 Units Subcutaneous Q8H  . isosorbide-hydrALAZINE  1 tablet Oral TID  . lanthanum  1,000 mg Oral TID WC  . levETIRAcetam  500 mg Oral BID  . mouth rinse  15 mL Mouth Rinse BID  . multivitamin  1 tablet Oral QHS  . pravastatin  20 mg Oral q1800  . sodium chloride flush  3 mL Intravenous Q12H  . sodium chloride flush  3 mL Intravenous Q12H     HD orders: No Center yet-relocated from [place] without procuring HD Center T,Th,S schedule for now  Assessment/Plan: 1. Hyperkalemia 2/2 noncompliance with HD. Resolved with HD. Last K+ 4.4. Renal function panel pending today.  2. ESRD - T,Th,S. Needs OP HD center.  3. Anemia - HGB 10.7 No ESA needed. Follow trend.  4. Secondary hyperparathyroidism - Ca 8.0 C Ca 8.8 Phos 6.8. Cont VDRA, binders 5. HTN/volume -Better BP control. Continue current meds. Post wt 2/14 94.5 kg. Nadir wt appears to be 94.5 kg. Continue lowering EDW as tolerated.  6. Nutrition - Albumin 2.9 renal diet, add renal vit, nepro  7. Seizures disorder: On Keppra per primary.  8. H/O CVA 9. Grade 2 diastolic dysfunction-monitor volume.    Lynnda Child PA-C [facility] Pager [phone] 08/15/2017,8:27 AM   I have seen and examined this patient and agree with plan and assessment in the above note with renal recommendations/intervention highlighted.  Pt was seen on HD and is tolerating it well.  No complaints and still waiting for outpatient dialysis to be arranged.  George John A Rickiya Picariello,MD 08/15/2017 8:46 AM

## 2017-08-15 NOTE — Progress Notes (Signed)
[facility] PROGRESS NOTE  George [name] ZOX:[id number] DOB: Mar 16, 1971 DOA: 08/08/2017 PCP: Patient, No Pcp Per  Brief:  47 year old male past medical history of seizures, hemodialysis, hypertension, diabetes and heart failure presented with weakness.  Was found to have hyperkalemia.  He emergently received dialysis.  In the middle treatment patient had a seizure.  Became hypoxic.  Rapid response called.  Patient came around with time and oxygen supplementation.  Sent to stepdown unit.  Loaded with Keppra IV.  Patient improved.  Return to unit for continued dialysis.  Finished treatment without episode.  No seizure since.  Initial EKG showed very prolonged QT.  With correction of electrolytes this is improved.  Cardiac echo done that shows grade 2 diastolic dysfunction.  Patient currently waiting on dialysis chair for discharge.  Assessment/Plan:  Hyperkalemia Resolved after HD MWF Sch  ESRD Nephro following Awaiting HD outpatient set up.   CHF Grad 2 diastolic sys Cont coreg and bidil  ECHO Ef 45 %. Concern for cardiac amyloidosis. Needs follow up with cardiologist   Seizure - seizure precautions -continue with keppra BID - IV ativan prn  Acute hypoxic respiratory failure ; Hypoxia 2/2 pulm edema; resolved - likely due to seizure, pt improving -Stable.    Hx of CVA Cont ASA and statin  Mild trop/Long QT Likely due to hypoxia and pulm edema ECHO--> Grade 2 dia dysf   Code Status: FC Family Communication:care discussed with patient  Disposition Plan: awaiting HD outpatient arrangement .   Consultants:  Neprho  Procedures:  HD  HPI/Subjective: No complaints. No chest pain.   Objective: Vitals:   08/15/17 1100 08/15/17 1119  BP: (!) 148/63 (!) 143/69  Pulse: 71 75  Resp:  (!) 21  Temp:  97.7 F (36.5 C)  SpO2:  93%    Intake/Output Summary (Last 24 hours) at 08/15/2017 1410 Last data filed at 08/15/2017 1119 Gross per 24 hour  Intake 342 ml   Output 4500 ml  Net -4158 ml   Filed Weights   08/13/17 2130 08/15/17 0706 08/15/17 1119  Weight: 94.8 kg (208 lb 15.9 oz) 97.7 kg (215 lb 6.2 oz) 93.3 kg (205 lb 11 oz)    Exam:   General:  NAD  Cardiovascular; S , S 2 RRR  Respiratory: CTA  Abdomen: BS present, soft, ND  Musculoskeletal: moves all extremities.   Data Reviewed: Basic Metabolic Panel: Recent Labs  Lab 08/08/17 1829  08/09/17 0402 08/09/17 0914 08/10/17 1512 08/11/17 0614 08/13/17 0740 08/15/17 0717  NA  --   --  138  --  135 136 134* 134*  K 4.3   < > 4.6 4.5 4.1 4.4 4.4 4.2  CL  --   --  97*  --  98* 97* 96* 95*  CO2  --   --  22  --  24 22 24 22   GLUCOSE  --   --  126*  --  87 90 118* 105*  BUN  --   --  67*  --  39* 49* 41* 45*  CREATININE  --   --  14.83*  --  11.12* 12.44* 10.63* 10.45*  CALCIUM  --   --  8.5*  --  8.3* 8.8* 8.0* 8.7*  MG 2.8*  --   --   --   --   --   --   --   PHOS 7.3*  --  7.9*  --  5.7* 7.0* 6.8* 6.9*   < > = values in this  interval not displayed.   Liver Function Tests: Recent Labs  Lab 08/09/17 0402 08/10/17 1512 08/11/17 0614 08/13/17 0740 08/15/17 0717  ALBUMIN 2.9* 2.8* 3.1* 2.9* 2.9*   No results for input(s): LIPASE, AMYLASE in the last 168 hours. No results for input(s): AMMONIA in the last 168 hours. CBC: Recent Labs  Lab 08/11/17 0614 08/13/17 0740 08/15/17 0718  WBC 5.5 4.5 5.4  HGB 11.5* 10.7* 10.9*  HCT 34.2* 31.2* 31.7*  MCV 97.2 96.3 96.9  PLT 149* 113* 112*   Cardiac Enzymes: Recent Labs  Lab 08/08/17 1829  TROPONINI 0.15*   BNP (last 3 results) No results for input(s): BNP in the last 8760 hours.  ProBNP (last 3 results) No results for input(s): PROBNP in the last 8760 hours.  CBG: Recent Labs  Lab 08/09/17 1619  GLUCAP 104*    Recent Results (from the past 240 hour(s))  MRSA PCR Screening     Status: None   Collection Time: 08/08/17 12:03 PM  Result Value Ref Range Status   MRSA by PCR NEGATIVE NEGATIVE Final     Comment:        The GeneXpert MRSA Assay (FDA approved for NASAL specimens only), is one component of a comprehensive MRSA colonization surveillance program. It is not intended to diagnose MRSA infection nor to guide or monitor treatment for MRSA infections. Performed at [facility], [place] [address] [place], [place] [address]      Studies: No results found.  Scheduled Meds: . amLODipine  5 mg Oral Daily  . aspirin EC  81 mg Oral Daily  . calcitRIOL  0.25 mcg Oral Q M,W,F  . carvedilol  25 mg Oral BID WC  . feeding supplement (NEPRO CARB STEADY)  237 mL Oral Q24H  . heparin  5,000 Units Subcutaneous Q8H  . isosorbide-hydrALAZINE  1 tablet Oral TID  . lanthanum  1,000 mg Oral TID WC  . levETIRAcetam  500 mg Oral BID  . mouth rinse  15 mL Mouth Rinse BID  . multivitamin  1 tablet Oral QHS  . pravastatin  20 mg Oral q1800  . sodium chloride flush  3 mL Intravenous Q12H  . sodium chloride flush  3 mL Intravenous Q12H   Continuous Infusions: . sodium chloride      Principal Problem:   Hyperkalemia Active Problems:   Acute respiratory failure (HCC)   Essential hypertension   High anion gap metabolic acidosis   ESRD needing dialysis ([place])   Type 2 diabetes mellitus with complication (HCC)   Hemiparesis affecting left side as late effect of stroke (HCC)   Hypertensive urgency   Elevated troponin   Acute pulmonary edema ([place])   Seizure ([place])    Time spent: 35    George [name]  [facility] Pager AMION. If 7PM-7AM, please contact night-coverage at [email or web address], password [facility] 08/15/2017, 2:10 PM  LOS: 7 days

## 2017-08-16 NOTE — Progress Notes (Signed)
[facility] PROGRESS NOTE  Remington Highbaugh QMV:[id number] DOB: Mar 04, 1971 DOA: 08/08/2017 PCP: Patient, No Pcp Per  Brief:  47 year old male past medical history of seizures, hemodialysis, hypertension, diabetes and heart failure presented with weakness.  Was found to have hyperkalemia.  He emergently received dialysis.  In the middle treatment patient had a seizure.  Became hypoxic.  Rapid response called.  Patient came around with time and oxygen supplementation.  Sent to stepdown unit.  Loaded with Keppra IV.  Patient improved.  Return to unit for continued dialysis.  Finished treatment without episode.  No seizure since.  Initial EKG showed very prolonged QT.  With correction of electrolytes this is improved.  Cardiac echo done that shows grade 2 diastolic dysfunction.  Patient currently waiting on dialysis chair for discharge.  Assessment/Plan:  Hyperkalemia Resolved after HD MWF Sch  ESRD Nephro following Awaiting HD outpatient set up.   CHF Grad 2 diastolic sys Cont coreg and bidil  ECHO Ef 45 %. Concern for cardiac amyloidosis. Needs follow up with cardiologist  Denies dyspnea, chest pain.   Seizure - seizure precautions -continue with keppra BID - IV ativan prn no further seizure.   Acute hypoxic respiratory failure ; Hypoxia 2/2 pulm edema; resolved - likely due to seizure, pt improving -resolved.    Hx of CVA Cont ASA and statin  Mild trop/Long QT Likely due to hypoxia and pulm edema ECHO--> Grade 2 dia dysf  Thrombocytopenia; follow trend. Hb stable. No evidence of bleeding.    Code Status: FC Family Communication:care discussed with patient  Disposition Plan: awaiting HD outpatient arrangement .   Consultants:  Neprho  Procedures:  HD  HPI/Subjective: He is lying down in bed. Denies chest pain, dyspnea.    Objective: Vitals:   08/15/17 2126 08/16/17 0535  BP: 134/62 (!) 144/63  Pulse: 74 65  Resp: 19 18  Temp: 98.2 F (36.8 C) 98.6 F  (37 C)  SpO2: 93% 93%    Intake/Output Summary (Last 24 hours) at 08/16/2017 1557 Last data filed at 08/16/2017 0600 Gross per 24 hour  Intake 360 ml  Output 0 ml  Net 360 ml   Filed Weights   08/15/17 0706 08/15/17 1119 08/15/17 2126  Weight: 97.7 kg (215 lb 6.2 oz) 93.3 kg (205 lb 11 oz) 93.3 kg (205 lb 11 oz)    Exam:   General: NAD  Cardiovascular; S 1, S 2 RRR  Respiratory: CTA  Abdomen: BS present, soft, nt  Musculoskeletal:move all 4 extremities.   Data Reviewed: Basic Metabolic Panel: Recent Labs  Lab 08/10/17 1512 08/11/17 0614 08/13/17 0740 08/15/17 0717  NA 135 136 134* 134*  K 4.1 4.4 4.4 4.2  CL 98* 97* 96* 95*  CO2 24 22 24 22   GLUCOSE 87 90 118* 105*  BUN 39* 49* 41* 45*  CREATININE 11.12* 12.44* 10.63* 10.45*  CALCIUM 8.3* 8.8* 8.0* 8.7*  PHOS 5.7* 7.0* 6.8* 6.9*   Liver Function Tests: Recent Labs  Lab 08/10/17 1512 08/11/17 0614 08/13/17 0740 08/15/17 0717  ALBUMIN 2.8* 3.1* 2.9* 2.9*   No results for input(s): LIPASE, AMYLASE in the last 168 hours. No results for input(s): AMMONIA in the last 168 hours. CBC: Recent Labs  Lab 08/11/17 0614 08/13/17 0740 08/15/17 0718  WBC 5.5 4.5 5.4  HGB 11.5* 10.7* 10.9*  HCT 34.2* 31.2* 31.7*  MCV 97.2 96.3 96.9  PLT 149* 113* 112*   Cardiac Enzymes: No results for input(s): CKTOTAL, CKMB, CKMBINDEX, TROPONINI in the last 168  hours. BNP (last 3 results) No results for input(s): BNP in the last 8760 hours.  ProBNP (last 3 results) No results for input(s): PROBNP in the last 8760 hours.  CBG: Recent Labs  Lab 08/09/17 1619  GLUCAP 104*    Recent Results (from the past 240 hour(s))  MRSA PCR Screening     Status: None   Collection Time: 08/08/17 12:03 PM  Result Value Ref Range Status   MRSA by PCR NEGATIVE NEGATIVE Final    Comment:        The GeneXpert MRSA Assay (FDA approved for NASAL specimens only), is one component of a comprehensive MRSA colonization surveillance  program. It is not intended to diagnose MRSA infection nor to guide or monitor treatment for MRSA infections. Performed at [facility], [place] [address], [place], [place] [address]      Studies: No results found.  Scheduled Meds: . amLODipine  5 mg Oral Daily  . aspirin EC  81 mg Oral Daily  . calcitRIOL  0.25 mcg Oral Q M,W,F  . carvedilol  25 mg Oral BID WC  . feeding supplement (NEPRO CARB STEADY)  237 mL Oral Q24H  . heparin  5,000 Units Subcutaneous Q8H  . isosorbide-hydrALAZINE  1 tablet Oral TID  . lanthanum  1,000 mg Oral TID WC  . levETIRAcetam  500 mg Oral BID  . mouth rinse  15 mL Mouth Rinse BID  . multivitamin  1 tablet Oral QHS  . pravastatin  20 mg Oral q1800  . sodium chloride flush  3 mL Intravenous Q12H  . sodium chloride flush  3 mL Intravenous Q12H   Continuous Infusions: . sodium chloride      Principal Problem:   Hyperkalemia Active Problems:   Acute respiratory failure (HCC)   Essential hypertension   High anion gap metabolic acidosis   ESRD needing dialysis ([place])   Type 2 diabetes mellitus with complication (HCC)   Hemiparesis affecting left side as late effect of stroke (HCC)   Hypertensive urgency   Elevated troponin   Acute pulmonary edema ([place])   Seizure ([place])    Time spent: 35    Delailah Spieth A Murielle Stang  [facility] Pager AMION. If 7PM-7AM, please contact night-coverage at [email or web address], password [facility] 08/16/2017, 3:57 PM  LOS: 8 days

## 2017-08-16 NOTE — Plan of Care (Signed)
  Clinical Measurements: Diagnostic test results will improve 08/16/2017 2022 - Progressing by Irish Lack, RN   Clinical Measurements: Ability to maintain clinical measurements within normal limits will improve 08/16/2017 2022 - Progressing by Irish Lack, RN

## 2017-08-16 NOTE — Progress Notes (Signed)
[facility] Progress Note   Subjective:  HD yesterday net UF 4.5L No cramping Frustrated waiting for outpatient placement "Yall are slow down here"  Objective Vitals:   08/15/17 1119 08/15/17 1811 08/15/17 2126 08/16/17 0535  BP: (!) 143/69 (!) 143/81 134/62 (!) 144/63  Pulse: 75 75 74 65  Resp: (!) 21 20 19 18   Temp: 97.7 F (36.5 C) 98.8 F (37.1 C) 98.2 F (36.8 C) 98.6 F (37 C)  TempSrc: Oral Oral Oral Oral  SpO2: 93% 94% 93% 93%  Weight: 93.3 kg (205 lb 11 oz)  93.3 kg (205 lb 11 oz)   Height:       Physical Exam General: WNWD male in NAD Heart: S1,S2, + S4 cardiac heave noted.  Lungs: CTAB Abdomen: Active BS Extremities: No LE edema Dialysis Access: L AVF aneurysmal +bruit   Additional Objective Labs: Basic Metabolic Panel: Recent Labs  Lab 08/11/17 0614 08/13/17 0740 08/15/17 0717  NA 136 134* 134*  K 4.4 4.4 4.2  CL 97* 96* 95*  CO2 22 24 22   GLUCOSE 90 118* 105*  BUN 49* 41* 45*  CREATININE 12.44* 10.63* 10.45*  CALCIUM 8.8* 8.0* 8.7*  PHOS 7.0* 6.8* 6.9*   Liver Function Tests: Recent Labs  Lab 08/11/17 0614 08/13/17 0740 08/15/17 0717  ALBUMIN 3.1* 2.9* 2.9*   No results for input(s): LIPASE, AMYLASE in the last 168 hours. CBC: Recent Labs  Lab 08/11/17 0614 08/13/17 0740 08/15/17 0718  WBC 5.5 4.5 5.4  HGB 11.5* 10.7* 10.9*  HCT 34.2* 31.2* 31.7*  MCV 97.2 96.3 96.9  PLT 149* 113* 112*   Blood Culture No results found for: SDES, SPECREQUEST, CULT, REPTSTATUS  Cardiac Enzymes: No results for input(s): CKTOTAL, CKMB, CKMBINDEX, TROPONINI in the last 168 hours. CBG: Recent Labs  Lab 08/09/17 1619  GLUCAP 104*   Iron Studies: No results for input(s): IRON, TIBC, TRANSFERRIN, FERRITIN in the last 72 hours. @lablastinr3 @ Studies/Results: No results found. Medications: . sodium chloride     . amLODipine  5 mg Oral Daily  . aspirin EC  81 mg Oral Daily  . calcitRIOL  0.25 mcg Oral Q M,W,F  . carvedilol   25 mg Oral BID WC  . feeding supplement (NEPRO CARB STEADY)  237 mL Oral Q24H  . heparin  5,000 Units Subcutaneous Q8H  . isosorbide-hydrALAZINE  1 tablet Oral TID  . lanthanum  1,000 mg Oral TID WC  . levETIRAcetam  500 mg Oral BID  . mouth rinse  15 mL Mouth Rinse BID  . multivitamin  1 tablet Oral QHS  . pravastatin  20 mg Oral q1800  . sodium chloride flush  3 mL Intravenous Q12H  . sodium chloride flush  3 mL Intravenous Q12H     HD orders: No Center yet-relocated from [place] without procuring HD Center T,Th,S schedule for now  Assessment/Plan: 1. Hyperkalemia 2/2 noncompliance with HD. Resolved with HD.    2. ESRD - T,Th,S. Needs OP HD center. Next HD 2/19  3. Anemia - HGB 10.9 No ESA needs. Follow trend.  4. Secondary hyperparathyroidism - Ca ok/Phos elevated 6.9 Has been refusing Fosrenol. Cont VDRA, binders 5. HTN/volume -Better BP control. Continue current meds. Post wt 2/16 93.3kg. Continue lowering EDW as tolerated.  6. Nutrition - Albumin 2.9 renal diet, add renal vit, nepro 7. Seizures disorder: On Keppra per primary.  8. H/O CVA 9. Grade 2 diastolic dysfunction-monitor volume.    Lynnda Child PA-C [facility] Pager [phone]  08/16/2017,8:27 AM   I have seen and examined this patient and agree with plan and assessment in the above note with renal recommendations/intervention highlighted.  Doing well, still awaiting outpatient dialysis arrangements.  George [name] George Goltz,MD 08/16/2017 2:02 PM

## 2017-08-17 NOTE — Progress Notes (Signed)
[facility] PROGRESS NOTE  George [name] PPI:[id number] DOB: May 14, 1971 DOA: 08/08/2017 PCP: Patient, No Pcp Per  Brief:  48 year old male past medical history of seizures, hemodialysis, hypertension, diabetes and heart failure presented with weakness.  Was found to have hyperkalemia.  He emergently received dialysis.  In the middle treatment patient had a seizure.  Became hypoxic.  Rapid response called.  Patient came around with time and oxygen supplementation.  Sent to stepdown unit.  Loaded with Keppra IV.  Patient improved.  Return to unit for continued dialysis.  Finished treatment without episode.  No seizure since.  Initial EKG showed very prolonged QT.  With correction of electrolytes this is improved.  Cardiac echo done that shows grade 2 diastolic dysfunction.  Patient currently waiting on dialysis chair for discharge.  Assessment/Plan:  Hyperkalemia Resolved after HD MWF Sch  ESRD Nephro following Awaiting HD outpatient set up.   CHF Grad 2 diastolic sys Cont coreg and bidil  ECHO Ef 45 %. Concern for cardiac amyloidosis. Needs follow up with cardiologist . Patient aware.  Denies dyspnea, chest pain.   Seizure - seizure precautions -continue with keppra BID - IV ativan prn no further seizure.   Acute hypoxic respiratory failure ; Hypoxia 2/2 pulm edema; resolved - likely due to seizure, pt improving -resolved.    Hx of CVA Cont ASA and statin  Mild trop/Long QT Likely due to hypoxia and pulm edema ECHO--> Grade 2 dia dysf  Thrombocytopenia; follow trend. Hb stable. No evidence of bleeding.  Stable.   Code Status: FC Family Communication:care discussed with patient  Disposition Plan: awaiting HD outpatient arrangement .   Consultants:  Neprho  Procedures:  HD  HPI/Subjective: Frustrated , that he doesn't have out patient HD appointment yet.  Denies chest pain or dyspnea.   Objective: Vitals:   08/17/17 0551 08/17/17 0900  BP: 125/62 132/68   Pulse: 80 84  Resp: 18 16  Temp: 98.7 F (37.1 C) 98 F (36.7 C)  SpO2: 93% 94%    Intake/Output Summary (Last 24 hours) at 08/17/2017 1505 Last data filed at 08/17/2017 1225 Gross per 24 hour  Intake 604 ml  Output 0 ml  Net 604 ml   Filed Weights   08/15/17 0706 08/15/17 1119 08/15/17 2126  Weight: 97.7 kg (215 lb 6.2 oz) 93.3 kg (205 lb 11 oz) 93.3 kg (205 lb 11 oz)    Exam:   General: NAD  Cardiovascular; S 1, S 2 RRR  Respiratory: CTA  Abdomen: BS present, soft, nt  Musculoskeletal: no edema.   Data Reviewed: Basic Metabolic Panel: Recent Labs  Lab 08/10/17 1512 08/11/17 0614 08/13/17 0740 08/15/17 0717  NA 135 136 134* 134*  K 4.1 4.4 4.4 4.2  CL 98* 97* 96* 95*  CO2 24 22 24 22   GLUCOSE 87 90 118* 105*  BUN 39* 49* 41* 45*  CREATININE 11.12* 12.44* 10.63* 10.45*  CALCIUM 8.3* 8.8* 8.0* 8.7*  PHOS 5.7* 7.0* 6.8* 6.9*   Liver Function Tests: Recent Labs  Lab 08/10/17 1512 08/11/17 0614 08/13/17 0740 08/15/17 0717  ALBUMIN 2.8* 3.1* 2.9* 2.9*   No results for input(s): LIPASE, AMYLASE in the last 168 hours. No results for input(s): AMMONIA in the last 168 hours. CBC: Recent Labs  Lab 08/11/17 0614 08/13/17 0740 08/15/17 0718  WBC 5.5 4.5 5.4  HGB 11.5* 10.7* 10.9*  HCT 34.2* 31.2* 31.7*  MCV 97.2 96.3 96.9  PLT 149* 113* 112*   Cardiac Enzymes: No results for input(s):  CKTOTAL, CKMB, CKMBINDEX, TROPONINI in the last 168 hours. BNP (last 3 results) No results for input(s): BNP in the last 8760 hours.  ProBNP (last 3 results) No results for input(s): PROBNP in the last 8760 hours.  CBG: No results for input(s): GLUCAP in the last 168 hours.  Recent Results (from the past 240 hour(s))  MRSA PCR Screening     Status: None   Collection Time: 08/08/17 12:03 PM  Result Value Ref Range Status   MRSA by PCR NEGATIVE NEGATIVE Final    Comment:        The GeneXpert MRSA Assay (FDA approved for NASAL specimens only), is one  component of a comprehensive MRSA colonization surveillance program. It is not intended to diagnose MRSA infection nor to guide or monitor treatment for MRSA infections. Performed at [facility], [place] [address], [place], [place] [address]      Studies: No results found.  Scheduled Meds: . amLODipine  5 mg Oral Daily  . aspirin EC  81 mg Oral Daily  . calcitRIOL  0.25 mcg Oral Q M,W,F  . carvedilol  25 mg Oral BID WC  . feeding supplement (NEPRO CARB STEADY)  237 mL Oral Q24H  . heparin  5,000 Units Subcutaneous Q8H  . isosorbide-hydrALAZINE  1 tablet Oral TID  . lanthanum  1,000 mg Oral TID WC  . levETIRAcetam  500 mg Oral BID  . mouth rinse  15 mL Mouth Rinse BID  . multivitamin  1 tablet Oral QHS  . pravastatin  20 mg Oral q1800  . sodium chloride flush  3 mL Intravenous Q12H  . sodium chloride flush  3 mL Intravenous Q12H   Continuous Infusions: . sodium chloride      Principal Problem:   Hyperkalemia Active Problems:   Acute respiratory failure (HCC)   Essential hypertension   High anion gap metabolic acidosis   ESRD needing dialysis ([place])   Type 2 diabetes mellitus with complication (HCC)   Hemiparesis affecting left side as late effect of stroke (HCC)   Hypertensive urgency   Elevated troponin   Acute pulmonary edema ([place])   Seizure ([place])    Time spent: 35    George [name]  [facility] Pager AMION. If 7PM-7AM, please contact night-coverage at [email or web address], password [email or web address] 08/17/2017, 3:05 PM  LOS: 9 days

## 2017-08-17 NOTE — Progress Notes (Signed)
  [facility] Progress Note   Subjective: Threatening to leave if he doesn't get center soon. No C/Os  Objective Vitals:   08/16/17 0535 08/16/17 2054 08/17/17 0551 08/17/17 0900  BP: (!) 144/63 (!) 119/59 125/62 132/68  Pulse: 65 74 80 84  Resp: 18 17 18 16   Temp: 98.6 F (37 C) 98.1 F (36.7 C) 98.7 F (37.1 C) 98 F (36.7 C)  TempSrc: Oral Oral Oral Oral  SpO2: 93% 91% 93% 94%  Weight:      Height:       Physical Exam General: chronically ill appearing male in NAD Heart: S1,S2 + S4  Lungs: CTAB Abdomen: Active BS Extremities: No LE edema Dialysis Access: L AVF aneurysmal + bruit  Additional Objective Labs: Basic Metabolic Panel: Recent Labs  Lab 08/11/17 0614 08/13/17 0740 08/15/17 0717  NA 136 134* 134*  K 4.4 4.4 4.2  CL 97* 96* 95*  CO2 22 24 22   GLUCOSE 90 118* 105*  BUN 49* 41* 45*  CREATININE 12.44* 10.63* 10.45*  CALCIUM 8.8* 8.0* 8.7*  PHOS 7.0* 6.8* 6.9*   Liver Function Tests: Recent Labs  Lab 08/11/17 0614 08/13/17 0740 08/15/17 0717  ALBUMIN 3.1* 2.9* 2.9*   No results for input(s): LIPASE, AMYLASE in the last 168 hours. CBC: Recent Labs  Lab 08/11/17 0614 08/13/17 0740 08/15/17 0718  WBC 5.5 4.5 5.4  HGB 11.5* 10.7* 10.9*  HCT 34.2* 31.2* 31.7*  MCV 97.2 96.3 96.9  PLT 149* 113* 112*   Blood Culture No results found for: SDES, SPECREQUEST, CULT, REPTSTATUS  Cardiac Enzymes: No results for input(s): CKTOTAL, CKMB, CKMBINDEX, TROPONINI in the last 168 hours. CBG: No results for input(s): GLUCAP in the last 168 hours. Iron Studies: No results for input(s): IRON, TIBC, TRANSFERRIN, FERRITIN in the last 72 hours. @lablastinr3 @ Studies/Results: No results found. Medications: . sodium chloride     . amLODipine  5 mg Oral Daily  . aspirin EC  81 mg Oral Daily  . calcitRIOL  0.25 mcg Oral Q M,W,F  . carvedilol  25 mg Oral BID WC  . feeding supplement (NEPRO CARB STEADY)  237 mL Oral Q24H  . heparin  5,000  Units Subcutaneous Q8H  . isosorbide-hydrALAZINE  1 tablet Oral TID  . lanthanum  1,000 mg Oral TID WC  . levETIRAcetam  500 mg Oral BID  . mouth rinse  15 mL Mouth Rinse BID  . multivitamin  1 tablet Oral QHS  . pravastatin  20 mg Oral q1800  . sodium chloride flush  3 mL Intravenous Q12H  . sodium chloride flush  3 mL Intravenous Q12H     HD orders: No Center yet-relocated from [place] without procuring HD Center T,Th,S schedule for now  Assessment/Plan: 1. Hyperkalemia 2/2 noncompliance with HD. Resolved with HD.    2. ESRD - T,Th,S. Needs OP HD center. Next HD 2/19  3. Anemia - HGB 10.9 No ESA needs. Follow trend.  4. Secondary hyperparathyroidism - Ca ok/Phos elevated 6.9 Has been refusing Fosrenol. Cont VDRA, binders 5. HTN/volume -Better BP control. Continue current meds. Post wt 2/16 93.3kg. Continue lowering EDW as tolerated.  6. Nutrition - Albumin 2.9 renal diet, add renal vit, nepro 7. Seizures disorder: On Keppra per primary.  8. H/O CVA 9. Grade 2 diastolic dysfunction-monitor volume.    Mattalyn Anderegg H. Nivin Braniff NP-C 08/17/2017, 1:36 PM  [employer] [phone]

## 2017-08-17 NOTE — Progress Notes (Signed)
Patient refusing IV access. MD notified and made aware. Education provided. Will continue to monitor.

## 2017-08-18 ENCOUNTER — Ambulatory Visit: Payer: Medicare Other | Admitting: Nurse Practitioner

## 2017-08-18 LAB — THC,MS,WB/SP RFX
CARBOXY-THC: 1.9 ng/mL
Cannabidiol: NEGATIVE ng/mL
Cannabinoid Confirmation: POSITIVE
Cannabinol: NEGATIVE ng/mL
Hydroxy-THC: NEGATIVE ng/mL
Tetrahydrocannabinol(THC): NEGATIVE ng/mL

## 2017-08-18 LAB — RENAL FUNCTION PANEL
Albumin: 3 g/dL — ABNORMAL LOW (ref 3.5–5.0)
Anion gap: 18 — ABNORMAL HIGH (ref 5–15)
BUN: 72 mg/dL — ABNORMAL HIGH (ref 6–20)
CO2: 20 mmol/L — ABNORMAL LOW (ref 22–32)
Calcium: 8.6 mg/dL — ABNORMAL LOW (ref 8.9–10.3)
Chloride: 94 mmol/L — ABNORMAL LOW (ref 101–111)
Creatinine, Ser: 11.95 mg/dL — ABNORMAL HIGH (ref 0.61–1.24)
GFR calc Af Amer: 5 mL/min — ABNORMAL LOW (ref >60)
GFR calc non Af Amer: 4 mL/min — ABNORMAL LOW (ref >60)
Glucose, Bld: 117 mg/dL — ABNORMAL HIGH (ref 65–99)
Phosphorus: 6.3 mg/dL — ABNORMAL HIGH (ref 2.5–4.6)
Potassium: 4.5 mmol/L (ref 3.5–5.1)
Sodium: 132 mmol/L — ABNORMAL LOW (ref 135–145)

## 2017-08-18 LAB — CBC
HCT: 30.6 % — ABNORMAL LOW (ref 39.0–52.0)
Hemoglobin: 10.5 g/dL — ABNORMAL LOW (ref 13.0–17.0)
MCH: 32.8 pg (ref 26.0–34.0)
MCHC: 34.3 g/dL (ref 30.0–36.0)
MCV: 95.6 fL (ref 78.0–100.0)
Platelets: 131 10*3/uL — ABNORMAL LOW (ref 150–400)
RBC: 3.2 MIL/uL — ABNORMAL LOW (ref 4.22–5.81)
RDW: 14.8 % (ref 11.5–15.5)
WBC: 4.3 K/uL (ref 4.0–10.5)

## 2017-08-18 LAB — DRUG SCREEN 10 W/CONF, SERUM
AMPHETAMINES, IA: NEGATIVE ng/mL
Barbiturates, IA: NEGATIVE ug/mL
Benzodiazepines, IA: NEGATIVE ng/mL
COCAINE & METABOLITE, IA: NEGATIVE ng/mL
Methadone, IA: NEGATIVE ng/mL
Opiates, IA: NEGATIVE ng/mL
Oxycodones, IA: NEGATIVE ng/mL
PHENCYCLIDINE, IA: NEGATIVE ng/mL
PROPOXYPHENE, IA: NEGATIVE ng/mL
THC(Marijuana) Metabolite, IA: POSITIVE ng/mL

## 2017-08-18 LAB — GLUCOSE, CAPILLARY: Glucose-Capillary: 73 mg/dL (ref 65–99)

## 2017-08-18 MED ORDER — LANTHANUM CARBONATE 1000 MG PO CHEW: 1000.0000 mg | CHEWABLE_TABLET | Freq: Three times a day (TID) | ORAL | 0 refills | Status: AC

## 2017-08-18 MED ORDER — HEPARIN SODIUM (PORCINE) 1000 UNIT/ML DIALYSIS: 1000.0000 [IU] | INTRAMUSCULAR | Status: DC | PRN

## 2017-08-18 MED ORDER — LIDOCAINE HCL (PF) 1 % IJ SOLN: 5.0000 mL | INTRAMUSCULAR | Status: DC | PRN

## 2017-08-18 MED ORDER — SODIUM CHLORIDE 0.9 % IV SOLN: 100.0000 mL | INTRAVENOUS | Status: DC | PRN

## 2017-08-18 MED ORDER — LIDOCAINE-PRILOCAINE 2.5-2.5 % EX CREA: 1.0000 "application " | TOPICAL_CREAM | CUTANEOUS | Status: DC | PRN

## 2017-08-18 MED ORDER — ALTEPLASE 2 MG IJ SOLR: 2.0000 mg | Freq: Once | INTRAMUSCULAR | Status: DC | PRN

## 2017-08-18 MED ORDER — PENTAFLUOROPROP-TETRAFLUOROETH EX AERO: 1.0000 "application " | INHALATION_SPRAY | CUTANEOUS | Status: DC | PRN

## 2017-08-18 MED ORDER — HEPARIN SODIUM (PORCINE) 1000 UNIT/ML DIALYSIS: 4000.0000 [IU] | Freq: Once | INTRAMUSCULAR | Status: DC

## 2017-08-18 NOTE — Progress Notes (Signed)
[facility] PROGRESS NOTE  George [name] ZOX:[id number] DOB: 07/06/1970 DOA: 08/08/2017 PCP: Patient, No Pcp Per  Brief:  47 year old male past medical history of seizures, hemodialysis, hypertension, diabetes and heart failure presented with weakness.  Was found to have hyperkalemia.  He emergently received dialysis.  In the middle treatment patient had a seizure.  Became hypoxic.  Rapid response called.  Patient came around with time and oxygen supplementation.  Sent to stepdown unit.  Loaded with Keppra IV.  Patient improved.  Return to unit for continued dialysis.  Finished treatment without episode.  No seizure since.  Initial EKG showed very prolonged QT.  With correction of electrolytes this is improved.  Cardiac echo done that shows grade 2 diastolic dysfunction.     Patient currently waiting on dialysis chair for discharge.  Assessment/Plan:  Hyperkalemia Resolved after HD MWF Sch  ESRD Nephro following Awaiting HD outpatient set up.   CHF Grad 2 diastolic sys Cont coreg and bidil  ECHO Ef 45 %. Concern for cardiac amyloidosis. Needs follow up with cardiologist . Patient aware.  Denies dyspnea, chest pain.   Seizure - seizure precautions -continue with keppra BID - IV ativan prn no further seizure.   Acute hypoxic respiratory failure ; Hypoxia 2/2 pulm edema; resolved - likely due to seizure, pt improving -resolved.    Hx of CVA Cont ASA and statin  Mild trop/Long QT Likely due to hypoxia and pulm edema ECHO--> Grade 2 dia dysf Repeat EKG in am.   Thrombocytopenia; follow trend. Hb stable. No evidence of bleeding.  Stable.   Code Status: FC Family Communication:care discussed with patient  Disposition Plan: awaiting HD outpatient arrangement .   Consultants:  Neprho  Procedures:  HD  HPI/Subjective: He denies chest pain, dyspnea.    Objective: Vitals:   08/18/17 1130 08/18/17 1433  BP: 135/82 (!) 188/91  Pulse: 67 70  Resp:  18  Temp:   97.8 F (36.6 C)  SpO2:  97%    Intake/Output Summary (Last 24 hours) at 08/18/2017 1538 Last data filed at 08/18/2017 0946 Gross per 24 hour  Intake 300 ml  Output 0 ml  Net 300 ml   Filed Weights   08/15/17 2126 08/17/17 2031 08/18/17 0945  Weight: 93.3 kg (205 lb 11 oz) 93.2 kg (205 lb 7.5 oz) 98.7 kg (217 lb 9.5 oz)    Exam:   General: NAD  Cardiovascular; S 1, S 2 RRR  Respiratory: CTA  Abdomen: BS present, soft, nt  Musculoskeletal: no edema.   Data Reviewed: Basic Metabolic Panel: Recent Labs  Lab 08/13/17 0740 08/15/17 0717 08/18/17 0900  NA 134* 134* 132*  K 4.4 4.2 4.5  CL 96* 95* 94*  CO2 24 22 20*  GLUCOSE 118* 105* 117*  BUN 41* 45* 72*  CREATININE 10.63* 10.45* 11.95*  CALCIUM 8.0* 8.7* 8.6*  PHOS 6.8* 6.9* 6.3*   Liver Function Tests: Recent Labs  Lab 08/13/17 0740 08/15/17 0717 08/18/17 0900  ALBUMIN 2.9* 2.9* 3.0*   No results for input(s): LIPASE, AMYLASE in the last 168 hours. No results for input(s): AMMONIA in the last 168 hours. CBC: Recent Labs  Lab 08/13/17 0740 08/15/17 0718 08/18/17 0900  WBC 4.5 5.4 4.3  HGB 10.7* 10.9* 10.5*  HCT 31.2* 31.7* 30.6*  MCV 96.3 96.9 95.6  PLT 113* 112* 131*   Cardiac Enzymes: No results for input(s): CKTOTAL, CKMB, CKMBINDEX, TROPONINI in the last 168 hours. BNP (last 3 results) No results for input(s): BNP in  the last 8760 hours.  ProBNP (last 3 results) No results for input(s): PROBNP in the last 8760 hours.  CBG: Recent Labs  Lab 08/18/17 1426  GLUCAP 73    No results found for this or any previous visit (from the past 240 hour(s)).   Studies: No results found.  Scheduled Meds: . amLODipine  5 mg Oral Daily  . aspirin EC  81 mg Oral Daily  . calcitRIOL  0.25 mcg Oral Q M,W,F  . carvedilol  25 mg Oral BID WC  . feeding supplement (NEPRO CARB STEADY)  237 mL Oral Q24H  . heparin  4,000 Units Dialysis Once in dialysis  . heparin  5,000 Units Subcutaneous Q8H  .  isosorbide-hydrALAZINE  1 tablet Oral TID  . lanthanum  1,000 mg Oral TID WC  . levETIRAcetam  500 mg Oral BID  . mouth rinse  15 mL Mouth Rinse BID  . multivitamin  1 tablet Oral QHS  . pravastatin  20 mg Oral q1800  . sodium chloride flush  3 mL Intravenous Q12H  . sodium chloride flush  3 mL Intravenous Q12H   Continuous Infusions: . sodium chloride    . sodium chloride    . sodium chloride      Principal Problem:   Hyperkalemia Active Problems:   Acute respiratory failure (HCC)   Essential hypertension   High anion gap metabolic acidosis   ESRD needing dialysis ([place])   Type 2 diabetes mellitus with complication (HCC)   Hemiparesis affecting left side as late effect of stroke (HCC)   Hypertensive urgency   Elevated troponin   Acute pulmonary edema ([place])   Seizure ([place])    Time spent: 35    Merilynn Haydu A Annisha Baar  [facility] Pager AMION. If 7PM-7AM, please contact night-coverage at [email or web address], password [facility] 08/18/2017, 3:38 PM  LOS: 10 days

## 2017-08-18 NOTE — Discharge Summary (Addendum)
Physician Discharge Summary  George [name] PXT:[id number] DOB: [birth year] DOA: 08/08/2017  PCP: Patient, No Pcp Per  Admit date: 08/08/2017 Discharge date: 08/18/2017  Admitted From: Home  Disposition: Home   Recommendations for Outpatient Follow-up:  1. Follow up with PCP in 1-2 weeks 2. Please obtain BMP/CBC in one week.  Discharge Condition: stable.  CODE STATUS: Full code.  Diet recommendation: Heart Healthy  Brief/Interim Summary: 47 year old male past medical history of seizures, hemodialysis, hypertension, diabetes and heart failure presented with weakness. Was found to have hyperkalemia.  He emergently received dialysis.  In the middle treatment patient had a seizure.  Became hypoxic. Rapid response called.  Patient came around with time and oxygen supplementation.  Sent to stepdown unit.  Loaded with Keppra IV. Patient improved.  Return to unit for continued dialysis.  Finished treatment without episode.  No seizure since.  Initial EKG showed very prolonged QT.  With correction of electrolytes this is improved.  Cardiac echo done that shows grade 2 diastolic dysfunction.      Assessment/Plan:  Hyperkalemia Resolved after HD MWF Sch  ESRD Nephro following Patient has been accepted to [address] [facility] center.  Now patient says he doesn't have transportation. SW to help arrange.    Acute on chronic CHF Grad 2 diastolic sys Cont coreg and bidil  ECHO Ef 45 %. Concern for cardiac amyloidosis. Needs follow up with cardiologist . Patient aware.  Denies dyspnea, chest pain.   Seizure - seizure precautions -continue with keppra BID - IV ativan prn no further seizure.   Acute hypoxic respiratory failure ; Hypoxia 2/2 pulm edema; resolved - likely due to seizure, pt improving -resolved.    Hx of CVA Cont ASA and statin  Mild trop/Long QT Likely due to hypoxia and pulm edema ECHO--> Grade 2 dia dysf Repeat EKG in am.   Thrombocytopenia; follow trend. Hb  stable. No evidence of bleeding.  Stable.     Discharge Diagnoses:  Principal Problem:   Hyperkalemia Active Problems:   Acute respiratory failure (HCC)   Essential hypertension   High anion gap metabolic acidosis   ESRD needing dialysis (HCC)   Type 2 diabetes mellitus with complication (HCC)   Hemiparesis affecting left side as late effect of stroke (HCC)   Hypertensive urgency   Elevated troponin   Acute pulmonary edema ([place])   Seizure [facility])    Discharge Instructions  Discharge Instructions    Diet - low sodium heart healthy   Complete by:  As directed    Increase activity slowly   Complete by:  As directed      Allergies as of 08/18/2017      Reactions   Lisinopril Shortness Of Breath   Pt's family said he had SOB, Chest pressure , and coughing       Medication List    STOP taking these medications   losartan 50 MG tablet Commonly known as:  COZAAR     TAKE these medications   amLODipine 5 MG tablet Commonly known as:  NORVASC Take 5 mg by mouth daily.   aspirin EC 81 MG tablet Take 81 mg by mouth daily.   calcitRIOL 0.25 MCG capsule Commonly known as:  ROCALTROL Take 1 capsule (0.25 mcg total) by mouth Every Tuesday,Thursday,and Saturday with dialysis. What changed:  when to take this   carvedilol 25 MG tablet Commonly known as:  COREG Take 25 mg by mouth 2 (two) times daily with a meal.   isosorbide-hydrALAZINE 20-37.5 MG tablet Commonly  known as:  BIDIL Take 1 tablet by mouth 3 (three) times daily.   lanthanum 1000 MG chewable tablet Commonly known as:  FOSRENOL Chew 1 tablet (1,000 mg total) by mouth 3 (three) times daily with meals.   levETIRAcetam 500 MG tablet Commonly known as:  KEPPRA Take 500 mg by mouth 2 (two) times daily.   pravastatin 20 MG tablet Commonly known as:  PRAVACHOL Take 20 mg by mouth every evening.      Follow-up Information    Nche, Charlene Brooke, NP. Go on 08/18/2017.   Specialty:  Internal Medicine Why:   Arrive at 1:45pm, bring photo ID and insurance card. Contact information: [address] [place] [address] [phone]          Allergies  Allergen Reactions  . Lisinopril Shortness Of Breath    Pt's family said he had SOB, Chest pressure , and coughing     Consultations:  Nephrology    Procedures/Studies: Dg Chest 2 View  Result Date: 08/10/2017 CLINICAL DATA:  Acute respiratory failure, CHF, diabetes, dialysis dependent renal failure, former smoker. EXAM: CHEST  2 VIEW COMPARISON:  Portable chest x-ray of August 08, 2017 FINDINGS: The lungs are well-expanded. There remains a small right pleural effusion. The cardiac silhouette remains enlarged. The right perihilar region remains dense. There is fluid in the minor fissure. The pulmonary vascularity is mildly engorged. There calcification in the wall of the aortic arch. IMPRESSION: CHF with mild interstitial edema, right perihilar atelectasis or pneumonia, and right pleural effusion. There has not been significant interval change since the earlier study. Thoracic aortic atherosclerosis. Electronically Signed   By: David  Martinique M.D.   On: 08/10/2017 07:21   Dg Chest 2 View  Result Date: 08/08/2017 CLINICAL DATA:  Shortness of breath EXAM: CHEST  2 VIEW COMPARISON:  06/19/2015 FINDINGS: Moderate cardiomegaly with vascular congestion and diffuse interstitial opacity consistent with pulmonary edema. Small right-sided pleural effusion with more focal airspace disease in the right mid to lower lung. Aortic atherosclerosis. No pneumothorax. IMPRESSION: 1. Moderate cardiomegaly with vascular congestion and mild interstitial edema 2. Small right pleural effusion. More focal airspace disease in the right mid to lower lung may reflect superimposed pneumonia Electronically Signed   By: Donavan Foil M.D.   On: 08/08/2017 02:21   Dg Chest Port 1 View  Result Date: 08/08/2017 CLINICAL DATA:  Hypoxia and shortness of breath. EXAM:  PORTABLE CHEST 1 VIEW COMPARISON:  08/08/2017 FINDINGS: Cardiomegaly with pulmonary vascular congestion noted. Bilateral interstitial opacities likely represent interstitial edema. A small right pleural effusion and right lower lung atelectasis versus airspace disease noted. There is no evidence of pneumothorax. IMPRESSION: Unchanged appearance of the chest with cardiomegaly,interstitial pulmonary edema, small right pleural effusion and right lower lung atelectasis/airspace disease again noted. Electronically Signed   By: Margarette Canada M.D.   On: 08/08/2017 11:43       Subjective:   Discharge Exam: Vitals:   08/18/17 1130 08/18/17 1433  BP: 135/82 (!) 188/91  Pulse: 67 70  Resp:  18  Temp:  97.8 F (36.6 C)  SpO2:  97%   Vitals:   08/18/17 1030 08/18/17 1100 08/18/17 1130 08/18/17 1433  BP: 134/84 (!) 142/56 135/82 (!) 188/91  Pulse: (!) 56 67 67 70  Resp: 18   18  Temp:    97.8 F (36.6 C)  TempSrc:    Oral  SpO2:    97%  Weight:      Height:  General: Pt is alert, awake, not in acute distress Cardiovascular: RRR, S1/S2 +, no rubs, no gallops Respiratory: CTA bilaterally, no wheezing, no rhonchi Abdominal: Soft, NT, ND, bowel sounds + Extremities: no edema, no cyanosis    The results of significant diagnostics from this hospitalization (including imaging, microbiology, ancillary and laboratory) are listed below for reference.     Microbiology: No results found for this or any previous visit (from the past 240 hour(s)).   Labs: BNP (last 3 results) No results for input(s): BNP in the last 8760 hours. Basic Metabolic Panel: Recent Labs  Lab 08/13/17 0740 08/15/17 0717 08/18/17 0900  NA 134* 134* 132*  K 4.4 4.2 4.5  CL 96* 95* 94*  CO2 24 22 20*  GLUCOSE 118* 105* 117*  BUN 41* 45* 72*  CREATININE 10.63* 10.45* 11.95*  CALCIUM 8.0* 8.7* 8.6*  PHOS 6.8* 6.9* 6.3*   Liver Function Tests: Recent Labs  Lab 08/13/17 0740 08/15/17 0717 08/18/17 0900   ALBUMIN 2.9* 2.9* 3.0*   No results for input(s): LIPASE, AMYLASE in the last 168 hours. No results for input(s): AMMONIA in the last 168 hours. CBC: Recent Labs  Lab 08/13/17 0740 08/15/17 0718 08/18/17 0900  WBC 4.5 5.4 4.3  HGB 10.7* 10.9* 10.5*  HCT 31.2* 31.7* 30.6*  MCV 96.3 96.9 95.6  PLT 113* 112* 131*   Cardiac Enzymes: No results for input(s): CKTOTAL, CKMB, CKMBINDEX, TROPONINI in the last 168 hours. BNP: Invalid input(s): POCBNP CBG: Recent Labs  Lab 08/18/17 1426  GLUCAP 73   D-Dimer No results for input(s): DDIMER in the last 72 hours. Hgb A1c No results for input(s): HGBA1C in the last 72 hours. Lipid Profile No results for input(s): CHOL, HDL, LDLCALC, TRIG, CHOLHDL, LDLDIRECT in the last 72 hours. Thyroid function studies No results for input(s): TSH, T4TOTAL, T3FREE, THYROIDAB in the last 72 hours.  Invalid input(s): FREET3 Anemia work up No results for input(s): VITAMINB12, FOLATE, FERRITIN, TIBC, IRON, RETICCTPCT in the last 72 hours. Urinalysis No results found for: COLORURINE, APPEARANCEUR, LABSPEC, [place], GLUCOSEU, HGBUR, BILIRUBINUR, KETONESUR, PROTEINUR, UROBILINOGEN, NITRITE, LEUKOCYTESUR Sepsis Labs Invalid input(s): PROCALCITONIN,  WBC,  LACTICIDVEN Microbiology No results found for this or any previous visit (from the past 240 hour(s)).   Time coordinating discharge: Over 30 minutes  SIGNED:   Elmarie Shiley, MD  [facility] 08/18/2017, 4:29 PM Pager [phone]  If 7PM-7AM, please contact night-coverage [email or web address] Password [email or web address]

## 2017-08-18 NOTE — Progress Notes (Signed)
[facility] Progress Note   Subjective: Patient is frustrated with being in hospital awaiting placement in center. Discussed with patient at length risk of trying to get HD by going through emergency room, reason for delay which is awaiting medicaid transfer from [place] to [place]. Encouraged to stay in hospital, try to be patient with process. Also found charger for his cell phone which makes him happier. Initially refused HD but agrees to go now.   Objective Vitals:   08/17/17 1700 08/17/17 2031 08/18/17 0447 08/18/17 0834  BP: (!) 150/81 138/68 (!) 128/59 (!) 162/95  Pulse: 72 68 76 63  Resp: 16 18 19    Temp: 98.2 F (36.8 C) 98.6 F (37 C) 98.4 F (36.9 C)   TempSrc: Oral Oral Oral   SpO2: 99% 100% 100%   Weight:  93.2 kg (205 lb 7.5 oz)    Height:       Physical Exam General: Obese chronically ill appearing male in NAD Heart: S1, S2, RRR Lungs: CTAB A/P Abdomen: Active BS Extremities: No LE edema.  Dialysis Access: L AVF + bruit aneurysmal but no thinning areas.    Additional Objective Labs: Basic Metabolic Panel: Recent Labs  Lab 08/13/17 0740 08/15/17 0717  NA 134* 134*  K 4.4 4.2  CL 96* 95*  CO2 24 22  GLUCOSE 118* 105*  BUN 41* 45*  CREATININE 10.63* 10.45*  CALCIUM 8.0* 8.7*  PHOS 6.8* 6.9*   Liver Function Tests: Recent Labs  Lab 08/13/17 0740 08/15/17 0717  ALBUMIN 2.9* 2.9*   No results for input(s): LIPASE, AMYLASE in the last 168 hours. CBC: Recent Labs  Lab 08/13/17 0740 08/15/17 0718  WBC 4.5 5.4  HGB 10.7* 10.9*  HCT 31.2* 31.7*  MCV 96.3 96.9  PLT 113* 112*   Blood Culture No results found for: SDES, SPECREQUEST, CULT, REPTSTATUS  Cardiac Enzymes: No results for input(s): CKTOTAL, CKMB, CKMBINDEX, TROPONINI in the last 168 hours. CBG: No results for input(s): GLUCAP in the last 168 hours. Iron Studies: No results for input(s): IRON, TIBC, TRANSFERRIN, FERRITIN in the last 72  hours. @lablastinr3 @ Studies/Results: No results found. Medications: . sodium chloride    . sodium chloride    . sodium chloride     . amLODipine  5 mg Oral Daily  . aspirin EC  81 mg Oral Daily  . calcitRIOL  0.25 mcg Oral Q M,W,F  . carvedilol  25 mg Oral BID WC  . feeding supplement (NEPRO CARB STEADY)  237 mL Oral Q24H  . heparin  4,000 Units Dialysis Once in dialysis  . heparin  5,000 Units Subcutaneous Q8H  . isosorbide-hydrALAZINE  1 tablet Oral TID  . lanthanum  1,000 mg Oral TID WC  . levETIRAcetam  500 mg Oral BID  . mouth rinse  15 mL Mouth Rinse BID  . multivitamin  1 tablet Oral QHS  . pravastatin  20 mg Oral q1800  . sodium chloride flush  3 mL Intravenous Q12H  . sodium chloride flush  3 mL Intravenous Q12H     HD orders: No Center yet-relocated from [place] without procuring HD Center T,Th,S schedule for now  Assessment/Plan: 1. Hyperkalemia 2/2 noncompliance with HD. Resolved with HD.  2. ESRD - T,Th,S. Needs OP HD center.HD today on schedule. Awaiting Medicaid approval for OP HD center placement.  3. Anemia - HGB 10.9No ESA needs. Follow trend.  4. Secondary hyperparathyroidism - Ca ok/Phos elevated 6.9 Has been refusing Fosrenol.Cont VDRA, binders. Check labs today  in HD.  5. HTN/volume -Better BP control. Continue current meds. Post wt 2/16 93.3kg. Continue lowering EDW as tolerated.  6. Nutrition - Albumin 2.9 renal diet, add renal vit, nepro 7. Seizures disorder: On Keppra per primary.  8. H/O CVA 9. Grade 2 diastolic dysfunction-monitor volume.      Zakkery [name] H. Darrik Richman NP-C 08/18/2017, 9:22 AM  [employer] [phone]

## 2017-08-18 NOTE — Progress Notes (Signed)
Pt being discharged home via wheelchair with family. Pt alert and oriented x4. VSS. Pt c/o no pain at this time. No signs of respiratory distress. Education complete and care plans resolved. IV removed with catheter intact and pt tolerated well. No further issues at this time. Pt to follow up with PCP. Samera Macy R, RN 

## 2017-08-18 NOTE — Procedures (Signed)
I was present at this dialysis session. I have reviewed the session itself and made appropriate changes.   Filed Weights   08/15/17 2126 08/17/17 2031 08/18/17 0945  Weight: 93.3 kg (205 lb 11 oz) 93.2 kg (205 lb 7.5 oz) 98.7 kg (217 lb 9.5 oz)    Recent Labs  Lab 08/18/17 0900  NA 132*  K 4.5  CL 94*  CO2 20*  GLUCOSE 117*  BUN 72*  CREATININE 11.95*  CALCIUM 8.6*  PHOS 6.3*    Recent Labs  Lab 08/13/17 0740 08/15/17 0718 08/18/17 0900  WBC 4.5 5.4 4.3  HGB 10.7* 10.9* 10.5*  HCT 31.2* 31.7* 30.6*  MCV 96.3 96.9 95.6  PLT 113* 112* 131*    Scheduled Meds: . amLODipine  5 mg Oral Daily  . aspirin EC  81 mg Oral Daily  . calcitRIOL  0.25 mcg Oral Q M,W,F  . carvedilol  25 mg Oral BID WC  . feeding supplement (NEPRO CARB STEADY)  237 mL Oral Q24H  . heparin  4,000 Units Dialysis Once in dialysis  . heparin  5,000 Units Subcutaneous Q8H  . isosorbide-hydrALAZINE  1 tablet Oral TID  . lanthanum  1,000 mg Oral TID WC  . levETIRAcetam  500 mg Oral BID  . mouth rinse  15 mL Mouth Rinse BID  . multivitamin  1 tablet Oral QHS  . pravastatin  20 mg Oral q1800  . sodium chloride flush  3 mL Intravenous Q12H  . sodium chloride flush  3 mL Intravenous Q12H   Continuous Infusions: . sodium chloride    . sodium chloride    . sodium chloride     PRN Meds:.sodium chloride, sodium chloride, sodium chloride, acetaminophen **OR** acetaminophen, albuterol, alteplase, heparin, hydrALAZINE, HYDROcodone-acetaminophen, labetalol, lidocaine (PF), lidocaine-prilocaine, LORazepam, ondansetron **OR** ondansetron (ZOFRAN) IV, pentafluoroprop-tetrafluoroeth, senna-docusate, sodium chloride flush, zolpidem   George Grippe  MD 08/18/2017, 11:14 AM

## 2017-08-18 NOTE — Discharge Instructions (Signed)
You have been accepted at [facility] street unit for HD starting Thursday at 10;45 am.

## 2017-08-18 NOTE — Care Management Note (Signed)
Case Management Note  Patient Details  Name: George [name] MRN: [id number] Date of Birth: [birth year]  Subjective/Objective:  No PCP. Missed appointment for 08/18/17 at 1:45PM.      Action/Plan: Called MD Office [phone] spoke with Helene Kelp and rescheduled appointment to establish PCP.  Expected Discharge Date:  08/18/17               Expected Discharge Plan:  Home/Self Care  In-House Referral:  Clinical Social Work, Museum/gallery curator Counselor(Hemodialysis clipping process)  Discharge planning Services  CM Consult(Establishing PCP)  Status of Service:  Completed, signed off  Kristen Cardinal, RN  Nurse Case manager [place] 08/18/2017, 4:29 PM

## 2017-09-02 ENCOUNTER — Ambulatory Visit: Payer: Medicare Other | Admitting: Nurse Practitioner

## 2017-11-21 ENCOUNTER — Emergency Department (HOSPITAL_COMMUNITY)
Admission: EM | Admit: 2017-11-21 | Discharge: 2017-11-21 | Disposition: A | Discharge status: Home / Self Care | Payer: Medicare Other | Attending: Emergency Medicine | Admitting: Emergency Medicine

## 2017-11-21 ENCOUNTER — Encounter (HOSPITAL_COMMUNITY): Payer: Self-pay | Admitting: Emergency Medicine

## 2017-11-21 DIAGNOSIS — Z87891 Personal history of nicotine dependence: Secondary | ICD-10-CM | POA: Diagnosis not present

## 2017-11-21 DIAGNOSIS — I69354 Hemiplegia and hemiparesis following cerebral infarction affecting left non-dominant side: Secondary | ICD-10-CM | POA: Insufficient documentation

## 2017-11-21 DIAGNOSIS — N186 End stage renal disease: Secondary | ICD-10-CM | POA: Diagnosis not present

## 2017-11-21 DIAGNOSIS — Z7982 Long term (current) use of aspirin: Secondary | ICD-10-CM | POA: Insufficient documentation

## 2017-11-21 DIAGNOSIS — I509 Heart failure, unspecified: Secondary | ICD-10-CM | POA: Insufficient documentation

## 2017-11-21 DIAGNOSIS — R569 Unspecified convulsions: Secondary | ICD-10-CM | POA: Diagnosis present

## 2017-11-21 DIAGNOSIS — I132 Hypertensive heart and chronic kidney disease with heart failure and with stage 5 chronic kidney disease, or end stage renal disease: Secondary | ICD-10-CM | POA: Diagnosis not present

## 2017-11-21 DIAGNOSIS — E1122 Type 2 diabetes mellitus with diabetic chronic kidney disease: Secondary | ICD-10-CM | POA: Diagnosis not present

## 2017-11-21 DIAGNOSIS — Z992 Dependence on renal dialysis: Secondary | ICD-10-CM | POA: Diagnosis not present

## 2017-11-21 DIAGNOSIS — Z79899 Other long term (current) drug therapy: Secondary | ICD-10-CM | POA: Diagnosis not present

## 2017-11-21 LAB — CBC WITH DIFFERENTIAL/PLATELET
Abs Immature Granulocytes: 0 10*3/uL (ref 0.0–0.1)
BASOS PCT: 1 %
Basophils Absolute: 0.1 10*3/uL (ref 0.0–0.1)
EOS ABS: 0.3 10*3/uL (ref 0.0–0.7)
Eosinophils Relative: 6 %
HCT: 35.3 % — ABNORMAL LOW (ref 39.0–52.0)
Hemoglobin: 12.1 g/dL — ABNORMAL LOW (ref 13.0–17.0)
IMMATURE GRANULOCYTES: 0 %
LYMPHS ABS: 0.9 10*3/uL (ref 0.7–4.0)
Lymphocytes Relative: 19 %
MCH: 33.2 pg (ref 26.0–34.0)
MCHC: 34.3 g/dL (ref 30.0–36.0)
MCV: 96.7 fL (ref 78.0–100.0)
Monocytes Absolute: 0.6 10*3/uL (ref 0.1–1.0)
Monocytes Relative: 12 %
NEUTROS PCT: 62 %
Neutro Abs: 2.9 10*3/uL (ref 1.7–7.7)
PLATELETS: 125 10*3/uL — AB (ref 150–400)
RBC: 3.65 MIL/uL — AB (ref 4.22–5.81)
RDW: 14 % (ref 11.5–15.5)
WBC: 4.7 10*3/uL (ref 4.0–10.5)

## 2017-11-21 LAB — I-STAT CHEM 8, ED
BUN: 21 mg/dL — ABNORMAL HIGH (ref 6–20)
Calcium, Ion: 0.92 mmol/L — ABNORMAL LOW (ref 1.15–1.40)
Chloride: 94 mmol/L — ABNORMAL LOW (ref 101–111)
Creatinine, Ser: 7.8 mg/dL — ABNORMAL HIGH (ref 0.61–1.24)
Glucose, Bld: 85 mg/dL (ref 65–99)
HEMATOCRIT: 37 % — AB (ref 39.0–52.0)
HEMOGLOBIN: 12.6 g/dL — AB (ref 13.0–17.0)
POTASSIUM: 3.4 mmol/L — AB (ref 3.5–5.1)
SODIUM: 136 mmol/L (ref 135–145)
TCO2: 26 mmol/L (ref 22–32)

## 2017-11-21 MED ORDER — ISOSORB DINITRATE-HYDRALAZINE 20-37.5 MG PO TABS
1.0000 | ORAL_TABLET | Freq: Once | ORAL | Status: AC
  Administered 2017-11-21: 1 via ORAL
  Filled 2017-11-21: qty 1

## 2017-11-21 MED ORDER — LEVETIRACETAM 500 MG PO TABS
1000.0000 mg | ORAL_TABLET | Freq: Once | ORAL | Status: AC
  Administered 2017-11-21: 1000 mg via ORAL
  Filled 2017-11-21: qty 2

## 2017-11-21 NOTE — ED Notes (Signed)
Pt states that he has been out of his seizure meds and one of his b/p meds for about 1 week

## 2017-11-21 NOTE — ED Provider Notes (Signed)
[facility] EMERGENCY DEPARTMENT Provider Note   CSN: [id number] Arrival date & time: 11/21/17  1635     History   Chief Complaint Chief Complaint  Patient presents with  . Seizures    HPI Yohance Hathorne is a 47 y.o. male.  HPI Patient is a 47 year old male with history as below, notable for ESRD on Tuesday, Thursday, and Saturday dialysis as well as seizure disorder, who presents from the dialysis center after having a seizure.  Patient was almost complete with his dialysis session when he was reported to have a seizure.  Patient does not recall what happened.  He does state that he has been out of his seizure and blood pressure medications for about 1 week.  He is on Keppra 500 mg twice daily.  He is on multiple blood pressure medications.  He states that he is only out of his Bidil.  He now reports he is back to his baseline.  He denies any pain or other complaints.  He missed his Thursday session of dialysis but did make it this past Tuesday and per report, almost completed his session today.  He is having trouble getting his medications based on financial reasons.  Past Medical History:  Diagnosis Date  . CHF (congestive heart failure) ([place])   . Diabetes mellitus without complication ([place])   . Hypertension   . Renal disorder   . Seizure [facility])     Patient Active Problem List   Diagnosis Date Noted  . Hypertensive urgency 08/08/2017  . Elevated troponin 08/08/2017  . Acute pulmonary edema ([place]) 08/08/2017  . Seizure ([place])   . ESRD needing dialysis ([place])   . Type 2 diabetes mellitus with complication ([place])   . Absolute anemia   . Tobacco abuse   . Hemiparesis affecting left side as late effect of stroke ([place])   . Cerebral infarction due to embolism of right middle cerebral artery ([place])   . Cerebral embolism with cerebral infarction 06/22/2015  . Confusion   . ESRD (end stage renal disease) on dialysis ([place])   . Right leg pain   . Gastrointestinal  hemorrhage with melena   . Hematemesis with nausea   . Hematemesis 06/17/2015  . Uremia 06/17/2015  . Hypoxia 06/16/2015  . Acute respiratory failure ([place]) 06/16/2015  . ESRD on peritoneal dialysis ([place]) 06/16/2015  . Diabetes mellitus without complication ([place]) 30/16/0109  . Essential hypertension 06/16/2015  . Pleuritic chest pain 06/16/2015  . Pain in the chest   . Hyperkalemia   . High anion gap metabolic acidosis   . SOB (shortness of breath)     Past Surgical History:  Procedure Laterality Date  . AV FISTULA PLACEMENT Left 06/19/2015   Procedure: LEFT ARTERIOVENOUS (AV) FISTULA CREATION;  Surgeon: Elam Dutch, MD;  Location: [place];  Service: Vascular;  Laterality: Left;  . CAPD REMOVAL N/A 06/22/2015   Procedure: CONTINUOUS AMBULATORY PERITONEAL DIALYSIS  (CAPD) CATHETER REMOVAL;  Surgeon: Ralene Ok, MD;  Location: [place];  Service: General;  Laterality: N/A;  . ESOPHAGOGASTRODUODENOSCOPY N/A 06/21/2015   Procedure: ESOPHAGOGASTRODUODENOSCOPY (EGD);  Surgeon: Laurence Spates, MD;  Location: [facility] ENDOSCOPY;  Service: Endoscopy;  Laterality: N/A;  . INSERTION OF DIALYSIS CATHETER Left 06/19/2015   Procedure: INSERTION OF DIALYSIS CATHETER LEFT INTERNAL JUGULAR;  Surgeon: Elam Dutch, MD;  Location: [place];  Service: Vascular;  Laterality: Left;  . peritoneal dialysis catheter placed     around 2015 in [place]  Home Medications    Prior to Admission medications   Medication Sig Start Date End Date Taking? Authorizing Provider  amLODipine (NORVASC) 5 MG tablet Take 5 mg by mouth daily.    [provider]  aspirin EC 81 MG tablet Take 81 mg by mouth daily.    [provider]  calcitRIOL (ROCALTROL) 0.25 MCG capsule Take 1 capsule (0.25 mcg total) by mouth Every Tuesday,Thursday,and Saturday with dialysis. Patient taking differently: Take 0.25 mcg by mouth every Monday, Wednesday, and Friday.  06/26/15   Hosie Poisson, MD  carvedilol  (COREG) 25 MG tablet Take 25 mg by mouth 2 (two) times daily with a meal.    [provider]  isosorbide-hydrALAZINE (BIDIL) 20-37.5 MG tablet Take 1 tablet by mouth 3 (three) times daily.    [provider]  lanthanum (FOSRENOL) 1000 MG chewable tablet Chew 1 tablet (1,000 mg total) by mouth 3 (three) times daily with meals. 08/18/17   Regalado, Belkys A, MD  levETIRAcetam (KEPPRA) 500 MG tablet Take 500 mg by mouth 2 (two) times daily.    [provider]  pravastatin (PRAVACHOL) 20 MG tablet Take 20 mg by mouth every evening.    [provider]    Family History Family History  Problem Relation Age of Onset  . Sudden Cardiac Death Neg Hx     Social History Social History   Tobacco Use  . Smoking status: Former Smoker    Types: Cigarettes  . Smokeless tobacco: Never Used  Substance Use Topics  . Alcohol use: No  . Drug use: Yes    Types: Marijuana     Allergies   Lisinopril   Review of Systems Review of Systems  Constitutional: Negative for chills and fever.  HENT: Positive for mouth sores. Negative for ear pain and sore throat.   Eyes: Negative for pain and visual disturbance.  Respiratory: Negative for cough and shortness of breath.   Cardiovascular: Negative for chest pain and palpitations.  Gastrointestinal: Negative for abdominal pain and vomiting.  Genitourinary: Negative for dysuria and hematuria.  Musculoskeletal: Negative for arthralgias and back pain.  Skin: Negative for color change and rash.  Neurological: Positive for seizures. Negative for syncope.  All other systems reviewed and are negative.    Physical Exam Updated Vital Signs BP (!) 189/115   Pulse 91   Temp (!) 97.5 F (36.4 C)   Resp 16   SpO2 100%   Physical Exam  Constitutional: He is oriented to person, place, and time. He appears well-developed and well-nourished.  HENT:  Head: Normocephalic.  Superficial laceration to tongue  Eyes: Conjunctivae  are normal.  Neck: Neck supple.  Cardiovascular: Normal rate and regular rhythm.  No murmur heard. Pulmonary/Chest: Effort normal and breath sounds normal. No respiratory distress.  Abdominal: Soft. He exhibits no distension. There is no tenderness.  Musculoskeletal: He exhibits no edema, tenderness or deformity.  Neurological: He is alert and oriented to person, place, and time.  Symmetric strength in all extremities, no weakness. Sensation intact throughout.  Skin: Skin is warm and dry.  Psychiatric: He has a normal mood and affect.  Nursing note and vitals reviewed.    ED Treatments / Results  Labs (all labs ordered are listed, but only abnormal results are displayed) Labs Reviewed  CBC WITH DIFFERENTIAL/PLATELET - Abnormal; Notable for the following components:      Result Value   RBC 3.65 (*)    Hemoglobin 12.1 (*)    HCT 35.3 (*)  Platelets 125 (*)    All other components within normal limits  I-STAT CHEM 8, ED - Abnormal; Notable for the following components:   Potassium 3.4 (*)    Chloride 94 (*)    BUN 21 (*)    Creatinine, Ser 7.80 (*)    Calcium, Ion 0.92 (*)    Hemoglobin 12.6 (*)    HCT 37.0 (*)    All other components within normal limits  I-STAT TROPONIN, ED    EKG None  Radiology No results found.  Procedures Procedures (including critical care time)  Medications Ordered in ED Medications  isosorbide-hydrALAZINE (BIDIL) 20-37.5 MG per tablet 1 tablet (1 tablet Oral Given 11/21/17 1902)  levETIRAcetam (KEPPRA) tablet 1,000 mg (1,000 mg Oral Given 11/21/17 1902)     Initial Impression / Assessment and Plan / ED Course  I have reviewed the triage vital signs and the nursing notes.  Pertinent labs & imaging results that were available during my care of the patient were reviewed by me and considered in my medical decision making (see chart for details).    Patient is a 47 year old male with history as above, who presents after having a seizure at  dialysis.  He has a history of seizures and is typically on Keppra.  However, he has been out of his Keppra for the last week.  He states the prescriptions are at the pharmacy, however he has not been able to fill the prescription yet.  He is also out of his Bidil.  On arrival here, patient is fully alert and oriented.  He is hypertensive.  His neurologic exam is normal.  He did appear to have bitten his tongue.  He denies any other injury.  I have given him Keppra and Bidil here.  It sounds like he completed most of his dialysis.  Labs were obtained here which were notable for mild hypokalemia, but otherwise reassuring.  I suspect his seizure today was due to medication noncompliance.  I have advised patient to pick up the medication as soon as possible and resume at his normal dose.  Return precautions were discussed in detail.  Patient was discharged in stable condition.  Final Clinical Impressions(s) / ED Diagnoses   Final diagnoses:  Seizure [facility])    ED Discharge Orders    None       Clifton James, MD 11/21/17 2028    Margette Fast, MD 11/21/17 2358

## 2017-11-21 NOTE — ED Triage Notes (Signed)
Pt here from dialysis with c/o seizures unsure if history of same , pt was 30 mins shy of completing his treatment

## 2018-07-02 DIAGNOSIS — Z992 Dependence on renal dialysis: Secondary | ICD-10-CM | POA: Diagnosis not present

## 2018-07-02 DIAGNOSIS — N186 End stage renal disease: Secondary | ICD-10-CM | POA: Diagnosis not present

## 2018-07-06 DIAGNOSIS — N186 End stage renal disease: Secondary | ICD-10-CM | POA: Diagnosis not present

## 2018-07-06 DIAGNOSIS — Z992 Dependence on renal dialysis: Secondary | ICD-10-CM | POA: Diagnosis not present

## 2018-07-07 DIAGNOSIS — N186 End stage renal disease: Secondary | ICD-10-CM | POA: Diagnosis not present

## 2018-07-07 DIAGNOSIS — Z992 Dependence on renal dialysis: Secondary | ICD-10-CM | POA: Diagnosis not present

## 2018-07-09 DIAGNOSIS — Z992 Dependence on renal dialysis: Secondary | ICD-10-CM | POA: Diagnosis not present

## 2018-07-09 DIAGNOSIS — N186 End stage renal disease: Secondary | ICD-10-CM | POA: Diagnosis not present

## 2018-07-12 DIAGNOSIS — D509 Iron deficiency anemia, unspecified: Secondary | ICD-10-CM | POA: Diagnosis not present

## 2018-07-12 DIAGNOSIS — N186 End stage renal disease: Secondary | ICD-10-CM | POA: Diagnosis not present

## 2018-07-12 DIAGNOSIS — Z992 Dependence on renal dialysis: Secondary | ICD-10-CM | POA: Diagnosis not present

## 2018-07-12 DIAGNOSIS — N2581 Secondary hyperparathyroidism of renal origin: Secondary | ICD-10-CM | POA: Diagnosis not present

## 2018-07-14 DIAGNOSIS — N186 End stage renal disease: Secondary | ICD-10-CM | POA: Diagnosis not present

## 2018-07-14 DIAGNOSIS — Z992 Dependence on renal dialysis: Secondary | ICD-10-CM | POA: Diagnosis not present

## 2018-07-16 DIAGNOSIS — Z992 Dependence on renal dialysis: Secondary | ICD-10-CM | POA: Diagnosis not present

## 2018-07-16 DIAGNOSIS — N186 End stage renal disease: Secondary | ICD-10-CM | POA: Diagnosis not present

## 2018-07-19 DIAGNOSIS — N186 End stage renal disease: Secondary | ICD-10-CM | POA: Diagnosis not present

## 2018-07-19 DIAGNOSIS — Z992 Dependence on renal dialysis: Secondary | ICD-10-CM | POA: Diagnosis not present

## 2018-07-20 ENCOUNTER — Ambulatory Visit: Payer: Self-pay | Admitting: Internal Medicine

## 2018-07-21 DIAGNOSIS — N186 End stage renal disease: Secondary | ICD-10-CM | POA: Diagnosis not present

## 2018-07-21 DIAGNOSIS — Z992 Dependence on renal dialysis: Secondary | ICD-10-CM | POA: Diagnosis not present

## 2018-07-23 DIAGNOSIS — Z992 Dependence on renal dialysis: Secondary | ICD-10-CM | POA: Diagnosis not present

## 2018-07-23 DIAGNOSIS — N186 End stage renal disease: Secondary | ICD-10-CM | POA: Diagnosis not present

## 2018-07-26 DIAGNOSIS — N2581 Secondary hyperparathyroidism of renal origin: Secondary | ICD-10-CM | POA: Diagnosis not present

## 2018-07-26 DIAGNOSIS — N186 End stage renal disease: Secondary | ICD-10-CM | POA: Diagnosis not present

## 2018-07-26 DIAGNOSIS — Z992 Dependence on renal dialysis: Secondary | ICD-10-CM | POA: Diagnosis not present

## 2018-07-27 ENCOUNTER — Encounter: Payer: Self-pay | Admitting: Internal Medicine

## 2018-07-27 ENCOUNTER — Ambulatory Visit (INDEPENDENT_AMBULATORY_CARE_PROVIDER_SITE_OTHER): Payer: Medicare HMO | Admitting: Internal Medicine

## 2018-07-27 VITALS — BP 144/90 | HR 74 | Temp 98.1°F | Ht 62.0 in | Wt 210.2 lb

## 2018-07-27 DIAGNOSIS — R7309 Other abnormal glucose: Secondary | ICD-10-CM | POA: Diagnosis not present

## 2018-07-27 DIAGNOSIS — N186 End stage renal disease: Secondary | ICD-10-CM

## 2018-07-27 DIAGNOSIS — Z598 Other problems related to housing and economic circumstances: Secondary | ICD-10-CM

## 2018-07-27 DIAGNOSIS — Z87898 Personal history of other specified conditions: Secondary | ICD-10-CM

## 2018-07-27 DIAGNOSIS — E7800 Pure hypercholesterolemia, unspecified: Secondary | ICD-10-CM | POA: Diagnosis not present

## 2018-07-27 DIAGNOSIS — R69 Illness, unspecified: Secondary | ICD-10-CM | POA: Diagnosis not present

## 2018-07-27 DIAGNOSIS — L299 Pruritus, unspecified: Secondary | ICD-10-CM | POA: Diagnosis not present

## 2018-07-27 DIAGNOSIS — F325 Major depressive disorder, single episode, in full remission: Secondary | ICD-10-CM

## 2018-07-27 DIAGNOSIS — I132 Hypertensive heart and chronic kidney disease with heart failure and with stage 5 chronic kidney disease, or end stage renal disease: Secondary | ICD-10-CM

## 2018-07-27 DIAGNOSIS — H3322 Serous retinal detachment, left eye: Secondary | ICD-10-CM

## 2018-07-27 DIAGNOSIS — I509 Heart failure, unspecified: Secondary | ICD-10-CM

## 2018-07-27 DIAGNOSIS — Z992 Dependence on renal dialysis: Secondary | ICD-10-CM | POA: Diagnosis not present

## 2018-07-27 DIAGNOSIS — I10 Essential (primary) hypertension: Secondary | ICD-10-CM

## 2018-07-27 DIAGNOSIS — Z599 Problem related to housing and economic circumstances, unspecified: Secondary | ICD-10-CM

## 2018-07-27 DIAGNOSIS — R51 Headache: Secondary | ICD-10-CM

## 2018-07-27 MED ORDER — AMLODIPINE BESYLATE 10 MG PO TABS: 10.0000 mg | ORAL_TABLET | Freq: Every day | ORAL | 0 refills | Status: AC

## 2018-07-27 MED ORDER — ISOSORB DINITRATE-HYDRALAZINE 20-37.5 MG PO TABS: 1.0000 | ORAL_TABLET | Freq: Three times a day (TID) | ORAL | 0 refills | Status: AC

## 2018-07-27 NOTE — Addendum Note (Signed)
Addended by: Nicki Guadalajara on: 07/27/2018 01:51 PM   Modules accepted: Orders

## 2018-07-27 NOTE — Progress Notes (Addendum)
Subjective:     Patient ID: George [name] , male    DOB: Nov 25, 1970 , 48 y.o.   MRN: [id number]   Chief Complaint  Patient presents with  . Chest Pain    wants a referral. pt states he has been having chest pains for the past 3 days on and off he stated he has been having heart palpitations  . Headache    wants a referral. pt has been having a headache for the past week and he has taken tylenol and it hasnt helped any.    HPI Here for FU HTN, he does not check his BP at home. Sine he moved to [place] he has not seen a cardiologist or eye MD. He used to weight over 300 lb and due to not knowing he had HTN or DM ended up with renal failure.  He  also feels down and " useless" since he cant work. Used to be a Biomedical scientist. Denies suicidal thoughts, but there are days he feels he would not be around. Is interested in talking with a therapist, but does not want medications. States his BP's have been elevated like now when he goes to dialysis.  Has continued going to dialysis weekly. Has not had any seizures.    Past Medical History:  Diagnosis Date  . CHF (congestive heart failure) ([place])   . Diabetes mellitus without complication ([place])   . Hypertension   . Renal disorder   . Seizure [facility])      Family History  Problem Relation Age of Onset  . Sudden Cardiac Death Neg Hx      Current Outpatient Medications:  .  amLODipine (NORVASC) 5 MG tablet, Take 5 mg by mouth daily., Disp: , Rfl:  .  calcitRIOL (ROCALTROL) 0.25 MCG capsule, Take 1 capsule (0.25 mcg total) by mouth Every Tuesday,Thursday,and Saturday with dialysis. (Patient taking differently: Take 0.25 mcg by mouth every Monday, Wednesday, and Friday. ), Disp: , Rfl:  .  carvedilol (COREG) 25 MG tablet, Take 25 mg by mouth 2 (two) times daily with a meal., Disp: , Rfl:  .  isosorbide-hydrALAZINE (BIDIL) 20-37.5 MG tablet, Take 1 tablet by mouth 3 (three) times daily., Disp: , Rfl:  .  lanthanum (FOSRENOL) 1000 MG chewable tablet, Chew 1 tablet  (1,000 mg total) by mouth 3 (three) times daily with meals., Disp: 90 tablet, Rfl: 0 .  levETIRAcetam (KEPPRA) 500 MG tablet, Take 500 mg by mouth 2 (two) times daily., Disp: , Rfl:  .  pravastatin (PRAVACHOL) 20 MG tablet, Take 20 mg by mouth every evening., Disp: , Rfl:    Allergies  Allergen Reactions  . Lisinopril Shortness Of Breath    Pt's family said he had SOB, Chest pressure , and coughing      Review of Systems  Constitutional: Positive for fatigue. Negative for appetite change, chills, diaphoresis and fever.  HENT: Negative for congestion, ear discharge, ear pain, postnasal drip, rhinorrhea, tinnitus and trouble swallowing.   Eyes: Positive for visual disturbance. Negative for pain, discharge, redness and itching.  Respiratory: Positive for cough. Negative for chest tightness and shortness of breath.        Gets occasional chest congestion  Cardiovascular: Positive for chest pain and leg swelling. Negative for palpitations.       Sharp pain lasting a few seconds 2 weeks ago, and again 2 days ago.   Gastrointestinal: Negative for abdominal pain, constipation, diarrhea, nausea and vomiting.  Endocrine: Positive for polydipsia. Negative for polyphagia.  Genitourinary: Negative for dysuria, frequency and urgency.       Does not void much, even when he uses his furosemide which the nephrologist gives him for days he does not have dialysis.   Skin: Negative for rash and wound.       Gets itching off and on all over  Neurological: Positive for dizziness, light-headedness and headaches.       Sometimes mid day, Tylenol helps sometimes  Sometimes feels lightheaded in the morning.   Hematological: Negative for adenopathy.  Psychiatric/Behavioral: Positive for sleep disturbance. The patient is not nervous/anxious.        Sleep 1-2 h per night and cat naps during the day. Feels depressed.     Today's Vitals   07/27/18 0927  BP: (!) 144/90  Pulse: 74  Temp: 98.1 F (36.7 C)   TempSrc: Oral  SpO2: 96%  Weight: 210 lb 3.2 oz (95.3 kg)  Height: 5\' 2"  (1.575 m)  PainSc: 0-No pain   Body mass index is 38.45 kg/m.   Objective:  Physical Exam  Constitutional: She is oriented to person, place, and time. She appears well-developed and well-nourished. No distress.  HENT: PERRLA EOMI Head: Normocephalic and atraumatic.  Right Ear: External ear normal.  Left Ear: External ear normal.  Nose: Nose normal.  Eyes: Conjunctivae are normal. Right eye exhibits no discharge. Left eye exhibits no discharge. No scleral icterus.  Neck: Neck supple. No thyromegaly present.  No carotid bruits bilaterally  Cardiovascular: Normal rate and regular rhythm.  No murmur heard. Pulmonary/Chest: Effort normal and breath sounds normal. No respiratory distress.  Musculoskeletal: Normal range of motion. She exhibits no edema. Has AV fistula on L forearm.  Lymphadenopathy: He has no cervical adenopathy.  Neurological: She is alert and oriented to person, place, and time. Gait and speech are normal, reflexes +2/4, rhomberg normal.  Skin: Skin is warm and dry. Capillary refill takes less than 2 seconds. No rash noted. She is not diaphoretic.  Psychiatric: She has a normal mood and affect. Her behavior is normal. Judgment and thought content normal.  Nursing note reviewed. EKG- unchanged when compared to 08/08/2017 EKG Assessment And Plan:    1. Other congestive heart failure (HCC)-chronic.  - Ambulatory referral to Cardiology  2. Major depressive disorder with single episode, in remission ([place]) acute. He declined medication, but willing to see a therapist.  - Ambulatory referral to Psychology - C3 and C4 - TSH - T4, Free - T3, free  3. Financial difficulties- would like to work, so I will ask C3 group if they could help him out.  - C3 and C4  4. Left retinal detachment- past hx of this.  - Ambulatory referral to Ophthalmology  5. Essential hypertension- chronic, not well  controlled. I increased his amlodipine to 10 mg. Fu in 1 month.  - CMP14 + Anion Gap - CBC no Diff  6. Pure hypercholesterolemia- chronic - TSH - T4, Free - T3, free - Lipid Profile  7. Abnormal glucose- past hx of DM - Hemoglobin A1c 8- Pruritus- If liver enzymes are normal, then I may have him try hydroxyzine or periactin for itching.  9- End state renal failure on dialysis. - chronic. He will sign a release for [place] to get the labs he gets though the dialysis clinic.  10- HA- could be due to poor vision and stress, neuro exam is intact. Ref made for eye MSD 11- Atypical chest pain and hx of CHF- will follow with cardiologist.  Othman Masur [name], PA-C

## 2018-07-27 NOTE — Patient Instructions (Addendum)
PLEASE BRING ALL YOUR PILL BOTTLES SO I CAN UPDATE YOUR LIST HERE.   I'VE MADE THE REFERRAL FOR THERAPAPIST, EYE DR  AND CARDIOLOGIST  AND SOCIAL SERVICES TO HELP YOU FIND A JOB YOU CAN DO

## 2018-07-28 DIAGNOSIS — N186 End stage renal disease: Secondary | ICD-10-CM | POA: Diagnosis not present

## 2018-07-28 DIAGNOSIS — Z992 Dependence on renal dialysis: Secondary | ICD-10-CM | POA: Diagnosis not present

## 2018-07-30 DIAGNOSIS — N186 End stage renal disease: Secondary | ICD-10-CM | POA: Diagnosis not present

## 2018-07-30 DIAGNOSIS — Z992 Dependence on renal dialysis: Secondary | ICD-10-CM | POA: Diagnosis not present

## 2018-08-02 DIAGNOSIS — Z992 Dependence on renal dialysis: Secondary | ICD-10-CM | POA: Diagnosis not present

## 2018-08-02 DIAGNOSIS — N186 End stage renal disease: Secondary | ICD-10-CM | POA: Diagnosis not present

## 2018-08-04 ENCOUNTER — Encounter: Payer: Self-pay | Admitting: Family

## 2018-08-04 ENCOUNTER — Ambulatory Visit: Payer: Medicare HMO | Admitting: Physician Assistant

## 2018-08-04 NOTE — Progress Notes (Deleted)
Cardiology Office Note   Date:  08/04/2018   ID:  George [name], DOB [birth year], MRN [id number]  PCP:  Shelby Mattocks, PA-C  Cardiologist:   Jenkins Rouge, MD   No chief complaint on file.     History of Present Illness: George [name] is a 48 y.o. male who presents for consultation regarding CHF. 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 Had an echo done 08/10/17 with EF 45-50% moderate LVH grade 2 diastolic dysfunction severe LAE EF had been 60-65% by echo in 2016   Past Medical History:  Diagnosis Date  . CHF (congestive heart failure) ([place])   . Diabetes mellitus without complication ([place])   . Hypertension   . Renal disorder   . Seizure [facility])     Past Surgical History:  Procedure Laterality Date  . AV FISTULA PLACEMENT Left 06/19/2015   Procedure: LEFT ARTERIOVENOUS (AV) FISTULA CREATION;  Surgeon: Elam Dutch, MD;  Location: [facility];  Service: Vascular;  Laterality: Left;  . CAPD REMOVAL N/A 06/22/2015   Procedure: CONTINUOUS AMBULATORY PERITONEAL DIALYSIS  (CAPD) CATHETER REMOVAL;  Surgeon: Ralene Ok, MD;  Location: [place];  Service: General;  Laterality: N/A;  . ESOPHAGOGASTRODUODENOSCOPY N/A 06/21/2015   Procedure: ESOPHAGOGASTRODUODENOSCOPY (EGD);  Surgeon: Laurence Spates, MD;  Location: [facility] ENDOSCOPY;  Service: Endoscopy;  Laterality: N/A;  . INSERTION OF DIALYSIS CATHETER Left 06/19/2015   Procedure: INSERTION OF DIALYSIS CATHETER LEFT INTERNAL JUGULAR;  Surgeon: Elam Dutch, MD;  Location: [place];  Service: Vascular;  Laterality: Left;  . peritoneal dialysis catheter placed     around 2015 in [place]     Current Outpatient Medications  Medication Sig Dispense Refill  . amLODipine (NORVASC) 10 MG tablet Take 1 tablet (10 mg total) by mouth daily. 30 tablet 0  .  calcitRIOL (ROCALTROL) 0.25 MCG capsule Take 1 capsule (0.25 mcg total) by mouth Every Tuesday,Thursday,and Saturday with dialysis. (Patient taking differently: Take 0.25 mcg by mouth every Monday, Wednesday, and Friday. )    . carvedilol (COREG) 25 MG tablet Take 25 mg by mouth 2 (two) times daily with a meal.    . isosorbide-hydrALAZINE (BIDIL) 20-37.5 MG tablet Take 1 tablet by mouth 3 (three) times daily. 30 tablet 0  . lanthanum (FOSRENOL) 1000 MG chewable tablet Chew 1 tablet (1,000 mg total) by mouth 3 (three) times daily with meals. 90 tablet 0  . levETIRAcetam (KEPPRA) 500 MG tablet Take 500 mg by mouth 2 (two) times daily.    . pravastatin (PRAVACHOL) 20 MG tablet Take 20 mg by mouth every evening.     No current facility-administered medications for this visit.     Allergies:   Lisinopril    Social History:  The patient  reports that he has quit smoking. His smoking use included cigarettes. He has never used smokeless tobacco. He reports current drug use. Drug: Marijuana. He reports that he does not drink alcohol.   Family History:  The patient's family history is not on file.    ROS:  Please see the history of present illness.   Otherwise, review of systems are positive for none.   All other systems are reviewed and negative.    PHYSICAL EXAM: VS:  There were no vitals taken for this visit. , BMI There is no height or  weight on file to calculate BMI. Depressed Chronically ill  HEENT: normal Neck supple with no adenopathy JVP normal no bruits no thyromegaly Lungs clear with no wheezing and good diaphragmatic motion Heart:  S1/S2 SEM murmur, no rub, gallop or click PMI normal Abdomen: benighn, BS positve, no tenderness, no AAA no bruit.  No HSM or HJR Distal pulses intact with no bruits No edema Neuro non-focal Skin warm and dry No muscular weakness Fistula LUE    EKG:  SR LAD nonspecific ST changes 08/08/17   Recent Labs: 08/08/2017: Magnesium 2.8 11/21/2017: BUN  21; Creatinine, Ser 7.80; Hemoglobin 12.1; Platelets 125; Potassium 3.4; Sodium 136    Lipid Panel    Component Value Date/Time   CHOL 91 06/21/2015 0241   TRIG 107 06/21/2015 0241   HDL 17 (L) 06/21/2015 0241   CHOLHDL 5.4 06/21/2015 0241   VLDL 21 06/21/2015 0241   LDLCALC 53 06/21/2015 0241      Wt Readings from Last 3 Encounters:  07/27/18 210 lb 3.2 oz (95.3 kg)  08/18/17 210 lb 15.7 oz (95.7 kg)  06/27/15 189 lb 6 oz (85.9 kg)      Other studies Reviewed: Additional studies/ records that were reviewed today include: Notes from nephrology notes from primary labs notes From vascular surgery TTE 2016 and 2019.    ASSESSMENT AND PLAN:  1.  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  Volume control  Is with dialysis will update TTE 2. Chest Pain:  Atypical multiple risk factors abnormal ECG and decreased EF in past will order lexiscan myovue 3. HTN:  F/u renal and primary continue current meds 4. DM:  Discussed low carb diet.  Target hemoglobin A1c is 6.5 or less.  Continue current medications. 5. Depression:  Consider referral to behavioral health 6. Seizures:  Continue Keppra quiescent 7. HLD on statin labs with primary    Current medicines are reviewed at length with the patient today.  The patient does not have concerns regarding medicines.  The following changes have been made:  no change  Labs/ tests ordered today include: TTE, Lexi Myovue  No orders of the defined types were placed in this encounter.    Disposition:   FU with cardiology PRN      Signed, Jenkins Rouge, MD  08/04/2018 9:20 AM    [address], [place], [place]  [address] Phone: [phone]; Fax: [phone]

## 2018-08-05 DIAGNOSIS — G40909 Epilepsy, unspecified, not intractable, without status epilepticus: Secondary | ICD-10-CM | POA: Diagnosis not present

## 2018-08-05 DIAGNOSIS — R69 Illness, unspecified: Secondary | ICD-10-CM | POA: Diagnosis not present

## 2018-08-05 DIAGNOSIS — I132 Hypertensive heart and chronic kidney disease with heart failure and with stage 5 chronic kidney disease, or end stage renal disease: Secondary | ICD-10-CM | POA: Diagnosis not present

## 2018-08-05 DIAGNOSIS — E1122 Type 2 diabetes mellitus with diabetic chronic kidney disease: Secondary | ICD-10-CM | POA: Diagnosis not present

## 2018-08-05 DIAGNOSIS — I249 Acute ischemic heart disease, unspecified: Secondary | ICD-10-CM | POA: Diagnosis not present

## 2018-08-05 DIAGNOSIS — J90 Pleural effusion, not elsewhere classified: Secondary | ICD-10-CM | POA: Diagnosis not present

## 2018-08-05 DIAGNOSIS — I517 Cardiomegaly: Secondary | ICD-10-CM | POA: Diagnosis not present

## 2018-08-05 DIAGNOSIS — R778 Other specified abnormalities of plasma proteins: Secondary | ICD-10-CM | POA: Diagnosis not present

## 2018-08-05 DIAGNOSIS — R0602 Shortness of breath: Secondary | ICD-10-CM | POA: Diagnosis not present

## 2018-08-05 DIAGNOSIS — R079 Chest pain, unspecified: Secondary | ICD-10-CM | POA: Diagnosis not present

## 2018-08-05 DIAGNOSIS — I509 Heart failure, unspecified: Secondary | ICD-10-CM | POA: Diagnosis not present

## 2018-08-05 DIAGNOSIS — R7989 Other specified abnormal findings of blood chemistry: Secondary | ICD-10-CM | POA: Diagnosis not present

## 2018-08-05 DIAGNOSIS — Z590 Homelessness: Secondary | ICD-10-CM | POA: Diagnosis not present

## 2018-08-05 DIAGNOSIS — R0789 Other chest pain: Secondary | ICD-10-CM | POA: Diagnosis not present

## 2018-08-05 DIAGNOSIS — R918 Other nonspecific abnormal finding of lung field: Secondary | ICD-10-CM | POA: Diagnosis not present

## 2018-08-06 DIAGNOSIS — R0789 Other chest pain: Secondary | ICD-10-CM | POA: Diagnosis not present

## 2018-08-06 DIAGNOSIS — I120 Hypertensive chronic kidney disease with stage 5 chronic kidney disease or end stage renal disease: Secondary | ICD-10-CM | POA: Diagnosis not present

## 2018-08-06 DIAGNOSIS — Z992 Dependence on renal dialysis: Secondary | ICD-10-CM | POA: Diagnosis not present

## 2018-08-06 DIAGNOSIS — I249 Acute ischemic heart disease, unspecified: Secondary | ICD-10-CM | POA: Diagnosis not present

## 2018-08-06 DIAGNOSIS — Z9115 Patient's noncompliance with renal dialysis: Secondary | ICD-10-CM | POA: Diagnosis not present

## 2018-08-06 DIAGNOSIS — E1122 Type 2 diabetes mellitus with diabetic chronic kidney disease: Secondary | ICD-10-CM | POA: Diagnosis not present

## 2018-08-06 DIAGNOSIS — R0602 Shortness of breath: Secondary | ICD-10-CM | POA: Diagnosis not present

## 2018-08-06 DIAGNOSIS — Z590 Homelessness: Secondary | ICD-10-CM | POA: Diagnosis not present

## 2018-08-06 DIAGNOSIS — D631 Anemia in chronic kidney disease: Secondary | ICD-10-CM | POA: Diagnosis not present

## 2018-08-06 DIAGNOSIS — R7989 Other specified abnormal findings of blood chemistry: Secondary | ICD-10-CM | POA: Diagnosis not present

## 2018-08-06 DIAGNOSIS — G40909 Epilepsy, unspecified, not intractable, without status epilepticus: Secondary | ICD-10-CM | POA: Diagnosis not present

## 2018-08-06 DIAGNOSIS — N186 End stage renal disease: Secondary | ICD-10-CM | POA: Diagnosis not present

## 2018-08-06 DIAGNOSIS — R69 Illness, unspecified: Secondary | ICD-10-CM | POA: Diagnosis not present

## 2018-08-06 DIAGNOSIS — R079 Chest pain, unspecified: Secondary | ICD-10-CM | POA: Diagnosis not present

## 2018-08-07 DIAGNOSIS — R69 Illness, unspecified: Secondary | ICD-10-CM | POA: Diagnosis not present

## 2018-08-07 DIAGNOSIS — I249 Acute ischemic heart disease, unspecified: Secondary | ICD-10-CM | POA: Diagnosis not present

## 2018-08-07 DIAGNOSIS — G40909 Epilepsy, unspecified, not intractable, without status epilepticus: Secondary | ICD-10-CM | POA: Diagnosis not present

## 2018-08-07 DIAGNOSIS — R7989 Other specified abnormal findings of blood chemistry: Secondary | ICD-10-CM | POA: Diagnosis not present

## 2018-08-07 DIAGNOSIS — R0789 Other chest pain: Secondary | ICD-10-CM | POA: Diagnosis not present

## 2018-08-07 DIAGNOSIS — R918 Other nonspecific abnormal finding of lung field: Secondary | ICD-10-CM | POA: Diagnosis not present

## 2018-08-07 DIAGNOSIS — R079 Chest pain, unspecified: Secondary | ICD-10-CM | POA: Diagnosis not present

## 2018-08-07 DIAGNOSIS — I517 Cardiomegaly: Secondary | ICD-10-CM | POA: Diagnosis not present

## 2018-08-07 DIAGNOSIS — R0602 Shortness of breath: Secondary | ICD-10-CM | POA: Diagnosis not present

## 2018-08-07 DIAGNOSIS — E1122 Type 2 diabetes mellitus with diabetic chronic kidney disease: Secondary | ICD-10-CM | POA: Diagnosis not present

## 2018-08-07 DIAGNOSIS — Z590 Homelessness: Secondary | ICD-10-CM | POA: Diagnosis not present

## 2018-08-10 ENCOUNTER — Emergency Department (HOSPITAL_COMMUNITY): Payer: Medicare HMO

## 2018-08-10 ENCOUNTER — Other Ambulatory Visit: Payer: Self-pay

## 2018-08-10 ENCOUNTER — Ambulatory Visit: Payer: Medicare HMO | Admitting: Cardiovascular Disease

## 2018-08-10 ENCOUNTER — Observation Stay (HOSPITAL_COMMUNITY)
Admission: EM | Admit: 2018-08-10 | Discharge: 2018-08-11 | Disposition: A | Discharge status: Home / Self Care | Payer: Medicare HMO | Attending: Internal Medicine | Admitting: Internal Medicine

## 2018-08-10 DIAGNOSIS — R9431 Abnormal electrocardiogram [ECG] [EKG]: Secondary | ICD-10-CM | POA: Insufficient documentation

## 2018-08-10 DIAGNOSIS — Z79899 Other long term (current) drug therapy: Secondary | ICD-10-CM | POA: Diagnosis not present

## 2018-08-10 DIAGNOSIS — G40909 Epilepsy, unspecified, not intractable, without status epilepticus: Secondary | ICD-10-CM | POA: Insufficient documentation

## 2018-08-10 DIAGNOSIS — F12929 Cannabis use, unspecified with intoxication, unspecified: Secondary | ICD-10-CM | POA: Insufficient documentation

## 2018-08-10 DIAGNOSIS — Z9115 Patient's noncompliance with renal dialysis: Secondary | ICD-10-CM | POA: Insufficient documentation

## 2018-08-10 DIAGNOSIS — I444 Left anterior fascicular block: Secondary | ICD-10-CM | POA: Diagnosis not present

## 2018-08-10 DIAGNOSIS — I5032 Chronic diastolic (congestive) heart failure: Secondary | ICD-10-CM | POA: Insufficient documentation

## 2018-08-10 DIAGNOSIS — Z87891 Personal history of nicotine dependence: Secondary | ICD-10-CM | POA: Diagnosis not present

## 2018-08-10 DIAGNOSIS — D631 Anemia in chronic kidney disease: Secondary | ICD-10-CM | POA: Insufficient documentation

## 2018-08-10 DIAGNOSIS — I132 Hypertensive heart and chronic kidney disease with heart failure and with stage 5 chronic kidney disease, or end stage renal disease: Secondary | ICD-10-CM | POA: Diagnosis not present

## 2018-08-10 DIAGNOSIS — I10 Essential (primary) hypertension: Secondary | ICD-10-CM | POA: Diagnosis not present

## 2018-08-10 DIAGNOSIS — Z7151 Drug abuse counseling and surveillance of drug abuser: Secondary | ICD-10-CM | POA: Insufficient documentation

## 2018-08-10 DIAGNOSIS — Z794 Long term (current) use of insulin: Secondary | ICD-10-CM | POA: Diagnosis not present

## 2018-08-10 DIAGNOSIS — Z888 Allergy status to other drugs, medicaments and biological substances status: Secondary | ICD-10-CM | POA: Diagnosis not present

## 2018-08-10 DIAGNOSIS — Z992 Dependence on renal dialysis: Secondary | ICD-10-CM | POA: Diagnosis not present

## 2018-08-10 DIAGNOSIS — E8770 Fluid overload, unspecified: Secondary | ICD-10-CM | POA: Diagnosis not present

## 2018-08-10 DIAGNOSIS — Z590 Homelessness: Secondary | ICD-10-CM | POA: Insufficient documentation

## 2018-08-10 DIAGNOSIS — R0602 Shortness of breath: Secondary | ICD-10-CM | POA: Diagnosis not present

## 2018-08-10 DIAGNOSIS — E875 Hyperkalemia: Principal | ICD-10-CM | POA: Diagnosis present

## 2018-08-10 DIAGNOSIS — E118 Type 2 diabetes mellitus with unspecified complications: Secondary | ICD-10-CM | POA: Diagnosis present

## 2018-08-10 DIAGNOSIS — R569 Unspecified convulsions: Secondary | ICD-10-CM

## 2018-08-10 DIAGNOSIS — R079 Chest pain, unspecified: Secondary | ICD-10-CM | POA: Diagnosis not present

## 2018-08-10 DIAGNOSIS — Z720 Tobacco use: Secondary | ICD-10-CM | POA: Diagnosis present

## 2018-08-10 DIAGNOSIS — E1122 Type 2 diabetes mellitus with diabetic chronic kidney disease: Secondary | ICD-10-CM | POA: Diagnosis not present

## 2018-08-10 DIAGNOSIS — E119 Type 2 diabetes mellitus without complications: Secondary | ICD-10-CM

## 2018-08-10 DIAGNOSIS — N186 End stage renal disease: Secondary | ICD-10-CM | POA: Diagnosis not present

## 2018-08-10 LAB — CBC
HEMATOCRIT: 36.7 % — AB (ref 39.0–52.0)
Hemoglobin: 12.1 g/dL — ABNORMAL LOW (ref 13.0–17.0)
MCH: 33 pg (ref 26.0–34.0)
MCHC: 33 g/dL (ref 30.0–36.0)
MCV: 100 fL (ref 80.0–100.0)
Platelets: 188 10*3/uL (ref 150–400)
RBC: 3.67 MIL/uL — ABNORMAL LOW (ref 4.22–5.81)
RDW: 15.5 % (ref 11.5–15.5)
WBC: 4.9 10*3/uL (ref 4.0–10.5)
nRBC: 0 % (ref 0.0–0.2)

## 2018-08-10 LAB — BASIC METABOLIC PANEL
ANION GAP: 14 (ref 5–15)
BUN: 77 mg/dL — ABNORMAL HIGH (ref 6–20)
CO2: 20 mmol/L — AB (ref 22–32)
Calcium: 8.6 mg/dL — ABNORMAL LOW (ref 8.9–10.3)
Chloride: 105 mmol/L (ref 98–111)
Creatinine, Ser: 14.88 mg/dL — ABNORMAL HIGH (ref 0.61–1.24)
GFR calc Af Amer: 4 mL/min — ABNORMAL LOW (ref >60)
GFR calc non Af Amer: 3 mL/min — ABNORMAL LOW (ref >60)
Glucose, Bld: 76 mg/dL (ref 70–99)
Potassium: 6.2 mmol/L — ABNORMAL HIGH (ref 3.5–5.1)
Sodium: 139 mmol/L (ref 135–145)

## 2018-08-10 LAB — BRAIN NATRIURETIC PEPTIDE: B Natriuretic Peptide: 2217.6 pg/mL — ABNORMAL HIGH (ref 0.0–100.0)

## 2018-08-10 LAB — POCT I-STAT TROPONIN I: Troponin i, poc: 0.05 ng/mL (ref 0.00–0.08)

## 2018-08-10 LAB — CBG MONITORING, ED: Glucose-Capillary: 89 mg/dL (ref 70–99)

## 2018-08-10 MED ORDER — DEXTROSE 50 % IV SOLN
1.0000 | Freq: Once | INTRAVENOUS | Status: AC
  Administered 2018-08-10: 50 mL via INTRAVENOUS
  Filled 2018-08-10: qty 50

## 2018-08-10 MED ORDER — SODIUM ZIRCONIUM CYCLOSILICATE 10 G PO PACK
10.0000 g | PACK | Freq: Once | ORAL | Status: AC
  Administered 2018-08-10: 10 g via ORAL
  Filled 2018-08-10: qty 1

## 2018-08-10 MED ORDER — HYDRALAZINE HCL 20 MG/ML IJ SOLN
10.0000 mg | Freq: Once | INTRAMUSCULAR | Status: AC
  Administered 2018-08-10: 10 mg via INTRAVENOUS
  Filled 2018-08-10: qty 1

## 2018-08-10 MED ORDER — INSULIN ASPART 100 UNIT/ML IV SOLN
5.0000 [IU] | Freq: Once | INTRAVENOUS | Status: AC
  Administered 2018-08-10: 5 [IU] via INTRAVENOUS
  Filled 2018-08-10: qty 0.05

## 2018-08-10 NOTE — ED Notes (Signed)
Spoke with Dr Levonne Lapping and states he would place order for elevated bp.

## 2018-08-10 NOTE — ED Provider Notes (Signed)
George [name] Provider Note   CSN: [id number] Arrival date & time: 08/10/18  1557     History   Chief Complaint Chief Complaint  Patient presents with  . Chest Pain    SOB    HPI George [name] is a 48 y.o. male.  The history is provided by the patient.  Chest Pain  Pain location:  Substernal area Pain quality: aching and dull   Pain radiates to:  Does not radiate Pain severity:  Mild Onset quality:  Gradual Timing:  Intermittent Progression:  Waxing and waning Chronicity:  Recurrent Context: at rest   Relieved by:  Nothing Ineffective treatments:  None tried Associated symptoms: shortness of breath   Associated symptoms: no abdominal pain, no back pain, no cough, no fever, no palpitations and no vomiting   Risk factors comment:  CHF, ESRD   Past Medical History:  Diagnosis Date  . CHF (congestive heart failure) ([place])   . Diabetes mellitus without complication ([place])   . Hypertension   . Renal disorder   . Seizure [facility])     Patient Active Problem List   Diagnosis Date Noted  . Hypertensive urgency 08/08/2017  . Elevated troponin 08/08/2017  . Acute pulmonary edema ([place]) 08/08/2017  . Seizure ([place])   . ESRD needing dialysis ([place])   . Type 2 diabetes mellitus with complication ([place])   . Absolute anemia   . Tobacco abuse   . Hemiparesis affecting left side as late effect of stroke ([place])   . Cerebral infarction due to embolism of right middle cerebral artery ([place])   . Cerebral embolism with cerebral infarction 06/22/2015  . Confusion   . ESRD (end stage renal disease) on dialysis ([place])   . Right leg pain   . Gastrointestinal hemorrhage with melena   . Hematemesis with nausea   . Hematemesis 06/17/2015  . Uremia 06/17/2015  . Hypoxia 06/16/2015  . Acute respiratory failure ([place]) 06/16/2015  . ESRD on peritoneal dialysis ([place]) 06/16/2015  . Diabetes mellitus without complication ([place]) 34/19/6222  . Essential hypertension  06/16/2015  . Pleuritic chest pain 06/16/2015  . Pain in the chest   . Hyperkalemia   . High anion gap metabolic acidosis   . SOB (shortness of breath)     Past Surgical History:  Procedure Laterality Date  . AV FISTULA PLACEMENT Left 06/19/2015   Procedure: LEFT ARTERIOVENOUS (AV) FISTULA CREATION;  Surgeon: Elam Dutch, MD;  Location: [place];  Service: Vascular;  Laterality: Left;  . CAPD REMOVAL N/A 06/22/2015   Procedure: CONTINUOUS AMBULATORY PERITONEAL DIALYSIS  (CAPD) CATHETER REMOVAL;  Surgeon: Ralene Ok, MD;  Location: [place];  Service: General;  Laterality: N/A;  . ESOPHAGOGASTRODUODENOSCOPY N/A 06/21/2015   Procedure: ESOPHAGOGASTRODUODENOSCOPY (EGD);  Surgeon: Laurence Spates, MD;  Location[facility] ENDOSCOPY;  Service: Endoscopy;  Laterality: N/A;  . INSERTION OF DIALYSIS CATHETER Left 06/19/2015   Procedure: INSERTION OF DIALYSIS CATHETER LEFT INTERNAL JUGULAR;  Surgeon: Elam Dutch, MD;  Location: [place];  Service: Vascular;  Laterality: Left;  . peritoneal dialysis catheter placed     around 2015 in [facility] Medications    Prior to Admission medications   Medication Sig Start Date End Date Taking? Authorizing Provider  amLODipine (NORVASC) 10 MG tablet Take 1 tablet (10 mg total) by mouth daily. 07/27/18 07/27/19 Yes [name], [name], PA-C  calcitRIOL (ROCALTROL) 0.25 MCG capsule Take 1 capsule (0.25 mcg total) by mouth Every Tuesday,Thursday,and Saturday  with dialysis. Patient taking differently: Take 0.25 mcg by mouth every Monday, Wednesday, and Friday.  06/26/15  Yes Hosie Poisson, MD  calcium acetate (PHOSLO) 667 MG capsule Take 1,334 mg by mouth 3 (three) times daily with meals.   Yes [provider]  carvedilol (COREG) 25 MG tablet Take 25 mg by mouth 2 (two) times daily with a meal.   Yes [provider]  isosorbide-hydrALAZINE (BIDIL) 20-37.5 MG tablet Take 1 tablet by mouth 3 (three) times daily. 07/27/18  Yes  [name], [name], PA-C  lanthanum (FOSRENOL) 1000 MG chewable tablet Chew 1 tablet (1,000 mg total) by mouth 3 (three) times daily with meals. 08/18/17  Yes Regalado, Belkys A, MD  levETIRAcetam (KEPPRA) 500 MG tablet Take 500 mg by mouth 2 (two) times daily.   Yes [provider]  lidocaine-prilocaine (EMLA) cream Apply 1 application topically as needed (before dialysis.).   Yes [provider]  multivitamin (RENA-VIT) TABS tablet Take 1 tablet by mouth daily.   Yes [provider]  pravastatin (PRAVACHOL) 20 MG tablet Take 20 mg by mouth every evening.   Yes [provider]    Family History Family History  Problem Relation Age of Onset  . Sudden Cardiac Death Neg Hx     Social History Social History   Tobacco Use  . Smoking status: Former Smoker    Types: Cigarettes  . Smokeless tobacco: Never Used  Substance Use Topics  . Alcohol use: No  . Drug use: Yes    Types: Marijuana     Allergies   Lisinopril   Review of Systems Review of Systems  Constitutional: Negative for chills and fever.  HENT: Negative for ear pain and sore throat.   Eyes: Negative for pain and visual disturbance.  Respiratory: Positive for shortness of breath. Negative for cough.   Cardiovascular: Positive for chest pain and leg swelling. Negative for palpitations.  Gastrointestinal: Negative for abdominal pain and vomiting.  Genitourinary: Negative for dysuria and hematuria.  Musculoskeletal: Negative for arthralgias and back pain.  Skin: Negative for color change and rash.  Neurological: Negative for seizures and syncope.  All other systems reviewed and are negative.    Physical Exam Updated Vital Signs  ED Triage Vitals  Enc Vitals Group     BP 08/10/18 1607 (!) 178/115     Pulse Rate 08/10/18 1607 77     Resp 08/10/18 1607 19     Temp 08/10/18 1607 98.2 F (36.8 C)     Temp Source 08/10/18 1607 Oral     SpO2 08/10/18 1607 94 %     Weight  08/10/18 1605 210 lb (95.3 kg)     Height 08/10/18 1605 5\' 2"  (1.575 m)     Head Circumference --      Peak Flow --      Pain Score 08/10/18 1605 7     Pain Loc --      Pain Edu? --      Excl. in [place]? --     Physical Exam Vitals signs and nursing note reviewed.  Constitutional:      Appearance: He is well-developed.  HENT:     Head: Normocephalic and atraumatic.  Eyes:     Conjunctiva/sclera: Conjunctivae normal.     Pupils: Pupils are equal, round, and reactive to light.  Neck:     Musculoskeletal: Normal range of motion and neck supple.  Cardiovascular:     Rate and Rhythm: Normal rate and regular rhythm.  Pulses:          Radial pulses are 2+ on the right side and 2+ on the left side.       Dorsalis pedis pulses are 2+ on the right side.     Heart sounds: Normal heart sounds. No murmur.  Pulmonary:     Effort: Pulmonary effort is normal. No respiratory distress.     Breath sounds: Decreased breath sounds, rhonchi and rales present.  Abdominal:     Palpations: Abdomen is soft.     Tenderness: There is no abdominal tenderness.  Musculoskeletal: Normal range of motion.     Right lower leg: Edema present.     Left lower leg: Edema present.  Skin:    General: Skin is warm and dry.  Neurological:     Mental Status: He is alert.  Psychiatric:        Mood and Affect: Mood normal.      ED Treatments / Results  Labs (all labs ordered are listed, but only abnormal results are displayed) Labs Reviewed  BASIC METABOLIC PANEL - Abnormal; Notable for the following components:      Result Value   Potassium 6.2 (*)    CO2 20 (*)    BUN 77 (*)    Creatinine, Ser 14.88 (*)    Calcium 8.6 (*)    GFR calc non Af Amer 3 (*)    GFR calc Af Amer 4 (*)    All other components within normal limits  CBC - Abnormal; Notable for the following components:   RBC 3.67 (*)    Hemoglobin 12.1 (*)    HCT 36.7 (*)    All other components within normal limits  BRAIN NATRIURETIC  PEPTIDE - Abnormal; Notable for the following components:   B Natriuretic Peptide 2,217.6 (*)    All other components within normal limits  I-STAT TROPONIN, ED  POCT I-STAT TROPONIN I    EKG EKG Interpretation  Date/Time:  Tuesday August 10 2018 16:07:01 EST Ventricular Rate:  77 PR Interval:    QRS Duration: 106 QT Interval:  427 QTC Calculation: 484 R Axis:   -95 Text Interpretation:  Sinus rhythm Prolonged PR interval Left anterior fascicular block Abnormal R-wave progression, late transition Borderline prolonged QT interval NO SIGNIFICANT CHANGE SINCE LAST TRACING YESTERDAY Confirmed by Lennice Sites [phone]) on 08/10/2018 5:49:50 PM   Radiology Dg Chest 2 View  Result Date: 08/10/2018 CLINICAL DATA:  Acute onset of chest pain and shortness of breath that began last night. End stage renal disease on hemodialysis. EXAM: CHEST - 2 VIEW COMPARISON:  08/10/2017 and earlier. FINDINGS: Cardiac silhouette markedly enlarged. Mild diffuse interstitial and likely airspace pulmonary edema. Hyperinflated RIGHT lung as noted previously. Chronic RIGHT pleural effusion and/or pleuroparenchymal scarring at the RIGHT base, unchanged. No visible LEFT pleural effusion. Visualized bony thorax intact. IMPRESSION: Mild CHF, with stable marked cardiomegaly and mild diffuse interstitial and likely airspace pulmonary edema. Stable chronic RIGHT pleural effusion and/or pleuroparenchymal scarring at the RIGHT base. Electronically Signed   By: Evangeline Dakin M.D.   On: 08/10/2018 16:42    Procedures .Critical Care Performed by: Lennice Sites, DO Authorized by: Lennice Sites, DO   Critical care provider statement:    Critical care time (minutes):  40   Critical care time was exclusive of:  Separately billable procedures and treating other patients and teaching time   Critical care was necessary to treat or prevent imminent or life-threatening deterioration of the following conditions:  Metabolic  crisis   Critical care was time spent personally by me on the following activities:  Blood draw for specimens, discussions with primary provider, discussions with consultants, development of treatment plan with patient or surrogate, evaluation of patient's response to treatment, examination of patient, interpretation of cardiac output measurements, obtaining history from patient or surrogate, ordering and performing treatments and interventions, ordering and review of laboratory studies, ordering and review of radiographic studies, pulse oximetry, re-evaluation of patient's condition and review of old charts   I assumed direction of critical care for this patient from another provider in my specialty: no     (including critical care time)  Medications Ordered in ED Medications  sodium zirconium cyclosilicate (LOKELMA) packet 10 g (has no administration in time range)  insulin aspart (novoLOG) injection 5 Units (5 Units Intravenous Given 08/10/18 2047)    And  dextrose 50 % solution 50 mL (50 mLs Intravenous Given 08/10/18 2032)     Initial Impression / Assessment and Plan / ED Course  I have reviewed the triage vital signs and the nursing notes.  Pertinent labs & imaging results that were available during my care of the patient were reviewed by me and considered in my medical decision making (see chart for details).     Zalmen Wrightsman is a 48 year old male with history of diabetes, hypertension, heart failure, end-stage renal disease who presents to the ED with shortness of breath, chest pain.  Patient with hypertension but otherwise unremarkable vitals.  Patient has not had dialysis since last Monday.  Is currently homeless.  Patient no signs of volume overload on exam.  Has rales, peripheral edema.  Overall is breathing comfortably on room air.  Lab work showed potassium of 6.2.  This was corrected with insulin IV as well as Lokelma.  EKG showed no concerning changes.  Patient had normal troponin.   BNP elevated.  Chest x-ray shows signs of volume overload.  Doubt infectious process.  Doubt ACS.  Likely symptoms secondary to volume overload likely from both heart failure and end-stage renal disease.  Contacted nephrology who recommends [facility] to [facility].  Hospitalist to admit.  Hemodynamically stable throughout my care.  This chart was dictated using voice recognition software.  Despite best efforts to proofread,  errors can occur which can change the documentation meaning.    Final Clinical Impressions(s) / ED Diagnoses   Final diagnoses:  Hyperkalemia  Hypervolemia, unspecified hypervolemia type    ED Discharge Orders    None       Lennice Sites, DO 08/10/18 2116

## 2018-08-10 NOTE — H&P (Signed)
History and Physical   George [name] ATF:[id number] DOB: 01-19-1971 DOA: 08/10/2018  Referring MD/NP/PA: Dr. Ronnald Nian  PCP: Shelby Mattocks, PA-C   Outpatient Specialists: In [place]  Patient coming from: Homeless  Chief Complaint: Shortness of breath  HPI: George [name] is a 48 y.o. male with medical history significant of end-stage renal disease on hemodialysis, diabetes, hypertension, seizure disorder, homelessness, diastolic dysfunction CHF who has missed dialysis with his last dialysis 8 days ago.  Patient came in to the ER with shortness of breath.  He has been taking what medicines he could take but he was found to have hyperkalemia.  He is also relatively fluid overloaded.  Patient will require hemodialysis.  His inability to have hemodialysis is due to homelessness.  ER has contacted nephrology who will be preparing patient for hemodialysis tomorrow.  Patient is to be admitted to [facility] under hospitalist service.  ED Course: Temperature is 98.2 blood pressure 152/100 now 181/113 pulse 77 respiratory 39 oxygen sats 89% on room air.  White count is 4.9 and hemoglobin 12.1 with platelet 188.  Potassium was 6.2 with BUN 77 and creatinine 14.88.  Chest x-ray showed mild CHF with stable mild cardiomegaly and mild diffuse interstitial and likely airspace pulmonary edema.  There was right pleural effusion also.  Review of Systems: As per HPI otherwise 10 point review of systems negative.    Past Medical History:  Diagnosis Date  . CHF (congestive heart failure) ([place])   . Diabetes mellitus without complication ([place])   . Hypertension   . Renal disorder   . Seizure [facility])     Past Surgical History:  Procedure Laterality Date  . AV FISTULA PLACEMENT Left 06/19/2015   Procedure: LEFT ARTERIOVENOUS (AV) FISTULA CREATION;  Surgeon: Elam Dutch, MD;  Location: [place];  Service: Vascular;  Laterality: Left;  . CAPD REMOVAL N/A 06/22/2015   Procedure: CONTINUOUS  AMBULATORY PERITONEAL DIALYSIS  (CAPD) CATHETER REMOVAL;  Surgeon: Ralene Ok, MD;  Location: [place];  Service: General;  Laterality: N/A;  . ESOPHAGOGASTRODUODENOSCOPY N/A 06/21/2015   Procedure: ESOPHAGOGASTRODUODENOSCOPY (EGD);  Surgeon: Laurence Spates, MD;  Location: [facility] ENDOSCOPY;  Service: Endoscopy;  Laterality: N/A;  . INSERTION OF DIALYSIS CATHETER Left 06/19/2015   Procedure: INSERTION OF DIALYSIS CATHETER LEFT INTERNAL JUGULAR;  Surgeon: Elam Dutch, MD;  Location: [place];  Service: Vascular;  Laterality: Left;  . peritoneal dialysis catheter placed     around 2015 in [place]     reports that he has quit smoking. His smoking use included cigarettes. He has never used smokeless tobacco. He reports current drug use. Drug: Marijuana. He reports that he does not drink alcohol.  Allergies  Allergen Reactions  . Lisinopril Shortness Of Breath    Pt's family said he had SOB, Chest pressure , and coughing     Family History  Problem Relation Age of Onset  . Sudden Cardiac Death Neg Hx      Prior to Admission medications   Medication Sig Start Date End Date Taking? Authorizing Provider  amLODipine (NORVASC) 10 MG tablet Take 1 tablet (10 mg total) by mouth daily. 07/27/18 07/27/19 Yes [name], [name], PA-C  calcitRIOL (ROCALTROL) 0.25 MCG capsule Take 1 capsule (0.25 mcg total) by mouth Every Tuesday,Thursday,and Saturday with dialysis. Patient taking differently: Take 0.25 mcg by mouth every Monday, Wednesday, and Friday.  06/26/15  Yes Hosie Poisson, MD  calcium acetate (PHOSLO) 667 MG capsule Take 1,334 mg by mouth 3 (three) times daily with meals.  Yes [provider]  carvedilol (COREG) 25 MG tablet Take 25 mg by mouth 2 (two) times daily with a meal.   Yes [provider]  isosorbide-hydrALAZINE (BIDIL) 20-37.5 MG tablet Take 1 tablet by mouth 3 (three) times daily. 07/27/18  Yes [name], [name], PA-C  lanthanum (FOSRENOL) 1000  MG chewable tablet Chew 1 tablet (1,000 mg total) by mouth 3 (three) times daily with meals. 08/18/17  Yes Regalado, Belkys A, MD  levETIRAcetam (KEPPRA) 500 MG tablet Take 500 mg by mouth 2 (two) times daily.   Yes [provider]  lidocaine-prilocaine (EMLA) cream Apply 1 application topically as needed (before dialysis.).   Yes [provider]  multivitamin (RENA-VIT) TABS tablet Take 1 tablet by mouth daily.   Yes [provider]  pravastatin (PRAVACHOL) 20 MG tablet Take 20 mg by mouth every evening.   Yes [provider]    Physical Exam: Vitals:   08/10/18 1607 08/10/18 1800 08/10/18 2000 08/10/18 2006  BP: (!) 178/115 (!) 152/100 (!) 171/104 (!) 171/104  Pulse: 77 72  71  Resp: 19 (!) 21 (!) 39 (!) 23  Temp: 98.2 F (36.8 C)     TempSrc: Oral     SpO2: 94% 96% 91% 94%  Weight:      Height:          Constitutional: NAD, calm, comfortable Vitals:   08/10/18 1607 08/10/18 1800 08/10/18 2000 08/10/18 2006  BP: (!) 178/115 (!) 152/100 (!) 171/104 (!) 171/104  Pulse: 77 72  71  Resp: 19 (!) 21 (!) 39 (!) 23  Temp: 98.2 F (36.8 C)     TempSrc: Oral     SpO2: 94% 96% 91% 94%  Weight:      Height:       Eyes: PERRL, lids and conjunctivae normal ENMT: Mucous membranes are moist. Posterior pharynx clear of any exudate or lesions.Normal dentition.  Neck: normal, supple, no masses, no thyromegaly Respiratory: Good air entry bilaterally with diffuse crackles but no wheezing. No accessory muscle use.  Cardiovascular: Regular rate and rhythm, no murmurs / rubs / gallops. 1+ extremity edema. 2+ pedal pulses. No carotid bruits.  Abdomen: no tenderness, no masses palpated. No hepatosplenomegaly. Bowel sounds positive.  Musculoskeletal: no clubbing / cyanosis. No joint deformity upper and lower extremities. Good ROM, no contractures. Normal muscle tone.  Skin: no rashes, lesions, ulcers. No induration Neurologic: CN 2-12 grossly intact. Sensation  intact, DTR normal. Strength 5/5 in all 4.  Psychiatric: Normal judgment and insight. Alert and oriented x 3. Normal mood.     Labs on Admission: I have personally reviewed following labs and imaging studies  CBC: Recent Labs  Lab 08/10/18 1721  WBC 4.9  HGB 12.1*  HCT 36.7*  MCV 100.0  PLT 462   Basic Metabolic Panel: Recent Labs  Lab 08/10/18 1721  NA 139  K 6.2*  CL 105  CO2 20*  GLUCOSE 76  BUN 77*  CREATININE 14.88*  CALCIUM 8.6*   GFR: Estimated Creatinine Clearance: 6.2 mL/min (A) (by C-G formula based on SCr of 14.88 mg/dL (H)). Liver Function Tests: No results for input(s): AST, ALT, ALKPHOS, BILITOT, PROT, ALBUMIN in the last 168 hours. No results for input(s): LIPASE, AMYLASE in the last 168 hours. No results for input(s): AMMONIA in the last 168 hours. Coagulation Profile: No results for input(s): INR, PROTIME in the last 168 hours. Cardiac Enzymes: No results for input(s): CKTOTAL, CKMB, CKMBINDEX, TROPONINI in the last 168 hours. BNP (  last 3 results) No results for input(s): PROBNP in the last 8760 hours. HbA1C: No results for input(s): HGBA1C in the last 72 hours. CBG: No results for input(s): GLUCAP in the last 168 hours. Lipid Profile: No results for input(s): CHOL, HDL, LDLCALC, TRIG, CHOLHDL, LDLDIRECT in the last 72 hours. Thyroid Function Tests: No results for input(s): TSH, T4TOTAL, FREET4, T3FREE, THYROIDAB in the last 72 hours. Anemia Panel: No results for input(s): VITAMINB12, FOLATE, FERRITIN, TIBC, IRON, RETICCTPCT in the last 72 hours. Urine analysis: No results found for: COLORURINE, APPEARANCEUR, LABSPEC, PHURINE, GLUCOSEU, HGBUR, BILIRUBINUR, KETONESUR, PROTEINUR, UROBILINOGEN, NITRITE, LEUKOCYTESUR Sepsis Labs: @LABRCNTIP (procalcitonin:4,lacticidven:4) )No results found for this or any previous visit (from the past 240 hour(s)).   Radiological Exams on Admission: Dg Chest 2 View  Result Date: 08/10/2018 CLINICAL DATA:  Acute  onset of chest pain and shortness of breath that began last night. End stage renal disease on hemodialysis. EXAM: CHEST - 2 VIEW COMPARISON:  08/10/2017 and earlier. FINDINGS: Cardiac silhouette markedly enlarged. Mild diffuse interstitial and likely airspace pulmonary edema. Hyperinflated RIGHT lung as noted previously. Chronic RIGHT pleural effusion and/or pleuroparenchymal scarring at the RIGHT base, unchanged. No visible LEFT pleural effusion. Visualized bony thorax intact. IMPRESSION: Mild CHF, with stable marked cardiomegaly and mild diffuse interstitial and likely airspace pulmonary edema. Stable chronic RIGHT pleural effusion and/or pleuroparenchymal scarring at the RIGHT base. Electronically Signed   By: Evangeline Dakin M.D.   On: 08/10/2018 16:42    EKG: Independently reviewed.  It shows normal sinus rhythm with bifascicular block and prolonged PR interval.  No old EKG to compare  Assessment/Plan Principal Problem:   Hyperkalemia Active Problems:   Diabetes mellitus without complication (HCC)   ESRD (end stage renal disease) on dialysis ([place])   Type 2 diabetes mellitus with complication (HCC)   Tobacco abuse   Seizure ([place])     #1 hyperkalemia: Secondary to missing hemodialysis.  Patient will be admitted to a telemetry bed.  Nephrology consulted and hemodialysis will be planned for tomorrow.  Patient has received insulin with D50 as well as bicarbonate.  EKG showed no peaked T waves.  #2 end-stage renal disease: On hemodialysis Mondays Wednesdays and Fridays.  Social worker involvement to ensure patient is able to continue hemodialysis.  #3 diabetes: Continue with sliding scale insulin.  #4 tobacco abuse: Counseling provided.  Patient will get nicotine patch.  #5 seizure disorder: Again continue home regimen.  Compliance may be an issue.   DVT prophylaxis: Heparin Code Status: Full code Family Communication: No family around Disposition Plan: To be determined Consults  called: Nephrology Admission status: Inpatient  Severity of Illness: The appropriate patient status for this patient is INPATIENT. Inpatient status is judged to be reasonable and necessary in order to provide the required intensity of service to ensure the patient's safety. The patient's presenting symptoms, physical exam findings, and initial radiographic and laboratory data in the context of their chronic comorbidities is felt to place them at high risk for further clinical deterioration. Furthermore, it is not anticipated that the patient will be medically stable for discharge from the hospital within 2 midnights of admission. The following factors support the patient status of inpatient.   " The patient's presenting symptoms include shortness of breath. " The worrisome physical exam findings include bilateral crackles and lower extremity edema. " The initial radiographic and laboratory data are worrisome because of chest x-ray showing pulmonary edema. " The chronic co-morbidities include end-stage renal disease.   * I certify  that at the point of admission it is my clinical judgment that the patient will require inpatient hospital care spanning beyond 2 midnights from the point of admission due to high intensity of service, high risk for further deterioration and high frequency of surveillance required.Barbette Merino MD [facility] Pager [phone]  If 7PM-7AM, please contact night-coverage [email or web address] Password [facility]  08/10/2018, 9:23 PM

## 2018-08-10 NOTE — ED Triage Notes (Signed)
Pt c/o SOB and chest pain that started last night. Currently pt c/o chest pain 7/10 constant pain. Reports hx CHF, kidney failure, pt reports currently dialysis pt, last time dialyzed last Monday.

## 2018-08-10 NOTE — ED Notes (Addendum)
Gave report to Kennieth Francois at [facility] Provider contacted regarding elevated BP

## 2018-08-11 ENCOUNTER — Other Ambulatory Visit: Payer: Self-pay | Admitting: Internal Medicine

## 2018-08-11 ENCOUNTER — Telehealth: Payer: Self-pay | Admitting: Internal Medicine

## 2018-08-11 ENCOUNTER — Ambulatory Visit: Payer: Self-pay

## 2018-08-11 DIAGNOSIS — N186 End stage renal disease: Secondary | ICD-10-CM

## 2018-08-11 DIAGNOSIS — Z992 Dependence on renal dialysis: Secondary | ICD-10-CM | POA: Diagnosis not present

## 2018-08-11 DIAGNOSIS — I509 Heart failure, unspecified: Secondary | ICD-10-CM

## 2018-08-11 DIAGNOSIS — I132 Hypertensive heart and chronic kidney disease with heart failure and with stage 5 chronic kidney disease, or end stage renal disease: Secondary | ICD-10-CM | POA: Diagnosis not present

## 2018-08-11 DIAGNOSIS — I5032 Chronic diastolic (congestive) heart failure: Secondary | ICD-10-CM | POA: Diagnosis not present

## 2018-08-11 DIAGNOSIS — E875 Hyperkalemia: Secondary | ICD-10-CM | POA: Diagnosis not present

## 2018-08-11 DIAGNOSIS — E1122 Type 2 diabetes mellitus with diabetic chronic kidney disease: Secondary | ICD-10-CM | POA: Diagnosis not present

## 2018-08-11 DIAGNOSIS — I120 Hypertensive chronic kidney disease with stage 5 chronic kidney disease or end stage renal disease: Secondary | ICD-10-CM | POA: Diagnosis not present

## 2018-08-11 DIAGNOSIS — E8770 Fluid overload, unspecified: Secondary | ICD-10-CM | POA: Diagnosis not present

## 2018-08-11 DIAGNOSIS — D631 Anemia in chronic kidney disease: Secondary | ICD-10-CM | POA: Diagnosis not present

## 2018-08-11 LAB — RENAL FUNCTION PANEL
ANION GAP: 16 — AB (ref 5–15)
Albumin: 3.3 g/dL — ABNORMAL LOW (ref 3.5–5.0)
BUN: 75 mg/dL — ABNORMAL HIGH (ref 6–20)
CO2: 21 mmol/L — ABNORMAL LOW (ref 22–32)
Calcium: 8.7 mg/dL — ABNORMAL LOW (ref 8.9–10.3)
Chloride: 102 mmol/L (ref 98–111)
Creatinine, Ser: 15.35 mg/dL — ABNORMAL HIGH (ref 0.61–1.24)
GFR calc non Af Amer: 3 mL/min — ABNORMAL LOW (ref >60)
GFR, EST AFRICAN AMERICAN: 4 mL/min — AB (ref >60)
Glucose, Bld: 134 mg/dL — ABNORMAL HIGH (ref 70–99)
Phosphorus: 5.7 mg/dL — ABNORMAL HIGH (ref 2.5–4.6)
Potassium: 5.3 mmol/L — ABNORMAL HIGH (ref 3.5–5.1)
SODIUM: 139 mmol/L (ref 135–145)

## 2018-08-11 LAB — CBC
HCT: 33.4 % — ABNORMAL LOW (ref 39.0–52.0)
Hemoglobin: 11.1 g/dL — ABNORMAL LOW (ref 13.0–17.0)
MCH: 31.8 pg (ref 26.0–34.0)
MCHC: 33.2 g/dL (ref 30.0–36.0)
MCV: 95.7 fL (ref 80.0–100.0)
Platelets: 178 10*3/uL (ref 150–400)
RBC: 3.49 MIL/uL — AB (ref 4.22–5.81)
RDW: 15.4 % (ref 11.5–15.5)
WBC: 5.2 10*3/uL (ref 4.0–10.5)
nRBC: 0 % (ref 0.0–0.2)

## 2018-08-11 LAB — GLUCOSE, CAPILLARY
GLUCOSE-CAPILLARY: 82 mg/dL (ref 70–99)
Glucose-Capillary: 93 mg/dL (ref 70–99)

## 2018-08-11 LAB — MRSA PCR SCREENING: MRSA by PCR: NEGATIVE

## 2018-08-11 LAB — HIV ANTIBODY (ROUTINE TESTING W REFLEX): HIV Screen 4th Generation wRfx: NONREACTIVE

## 2018-08-11 MED ORDER — PRAVASTATIN SODIUM 10 MG PO TABS
20.0000 mg | ORAL_TABLET | Freq: Every evening | ORAL | Status: DC
  Administered 2018-08-11: 20 mg via ORAL
  Filled 2018-08-11: qty 2

## 2018-08-11 MED ORDER — ZOLPIDEM TARTRATE 5 MG PO TABS: 5.0000 mg | ORAL_TABLET | Freq: Every evening | ORAL | Status: DC | PRN

## 2018-08-11 MED ORDER — INSULIN ASPART 100 UNIT/ML ~~LOC~~ SOLN: 0.0000 [IU] | Freq: Every day | SUBCUTANEOUS | Status: DC

## 2018-08-11 MED ORDER — CAMPHOR-MENTHOL 0.5-0.5 % EX LOTN
1.0000 "application " | TOPICAL_LOTION | Freq: Three times a day (TID) | CUTANEOUS | Status: DC | PRN
  Filled 2018-08-11: qty 222

## 2018-08-11 MED ORDER — ONDANSETRON HCL 4 MG PO TABS: 4.0000 mg | ORAL_TABLET | Freq: Four times a day (QID) | ORAL | Status: DC | PRN

## 2018-08-11 MED ORDER — SORBITOL 70 % SOLN: 30.0000 mL | Status: DC | PRN

## 2018-08-11 MED ORDER — INSULIN ASPART 100 UNIT/ML ~~LOC~~ SOLN: 0.0000 [IU] | Freq: Three times a day (TID) | SUBCUTANEOUS | Status: DC

## 2018-08-11 MED ORDER — NEPRO/CARBSTEADY PO LIQD
237.0000 mL | Freq: Three times a day (TID) | ORAL | Status: DC | PRN
  Filled 2018-08-11: qty 237

## 2018-08-11 MED ORDER — DOCUSATE SODIUM 283 MG RE ENEM
1.0000 | ENEMA | RECTAL | Status: DC | PRN
  Filled 2018-08-11: qty 1

## 2018-08-11 MED ORDER — HYDRALAZINE HCL 20 MG/ML IJ SOLN: 10.0000 mg | INTRAMUSCULAR | Status: DC | PRN

## 2018-08-11 MED ORDER — LEVETIRACETAM 500 MG PO TABS: 500.0000 mg | ORAL_TABLET | Freq: Two times a day (BID) | ORAL | Status: DC

## 2018-08-11 MED ORDER — HYDROXYZINE HCL 25 MG PO TABS: 25.0000 mg | ORAL_TABLET | Freq: Three times a day (TID) | ORAL | Status: DC | PRN

## 2018-08-11 MED ORDER — ACETAMINOPHEN 650 MG RE SUPP: 650.0000 mg | Freq: Four times a day (QID) | RECTAL | Status: DC | PRN

## 2018-08-11 MED ORDER — ACETAMINOPHEN 325 MG PO TABS: 650.0000 mg | ORAL_TABLET | Freq: Four times a day (QID) | ORAL | Status: DC | PRN

## 2018-08-11 MED ORDER — AMLODIPINE BESYLATE 10 MG PO TABS: 10.0000 mg | ORAL_TABLET | Freq: Every day | ORAL | Status: DC

## 2018-08-11 MED ORDER — CHLORHEXIDINE GLUCONATE CLOTH 2 % EX PADS
6.0000 | MEDICATED_PAD | Freq: Every day | CUTANEOUS | Status: DC
  Administered 2018-08-11: 6 via TOPICAL

## 2018-08-11 MED ORDER — LEVETIRACETAM 500 MG PO TABS
500.0000 mg | ORAL_TABLET | Freq: Two times a day (BID) | ORAL | Status: DC
  Administered 2018-08-11: 500 mg via ORAL
  Filled 2018-08-11: qty 1

## 2018-08-11 MED ORDER — ONDANSETRON HCL 4 MG/2ML IJ SOLN: 4.0000 mg | Freq: Four times a day (QID) | INTRAMUSCULAR | Status: DC | PRN

## 2018-08-11 MED ORDER — CARVEDILOL 25 MG PO TABS
25.0000 mg | ORAL_TABLET | Freq: Two times a day (BID) | ORAL | Status: DC
  Administered 2018-08-11 (×2): 25 mg via ORAL
  Filled 2018-08-11: qty 2
  Filled 2018-08-11: qty 1

## 2018-08-11 MED ORDER — HEPARIN SODIUM (PORCINE) 5000 UNIT/ML IJ SOLN
5000.0000 [IU] | Freq: Three times a day (TID) | INTRAMUSCULAR | Status: DC
  Filled 2018-08-11 (×2): qty 1

## 2018-08-11 MED ORDER — ISOSORB DINITRATE-HYDRALAZINE 20-37.5 MG PO TABS
1.0000 | ORAL_TABLET | Freq: Three times a day (TID) | ORAL | Status: DC
  Administered 2018-08-11: 1 via ORAL
  Filled 2018-08-11: qty 1

## 2018-08-11 MED ORDER — CALCIUM CARBONATE ANTACID 1250 MG/5ML PO SUSP
500.0000 mg | Freq: Four times a day (QID) | ORAL | Status: DC | PRN
  Filled 2018-08-11: qty 5

## 2018-08-11 NOTE — Discharge Summary (Signed)
Howard Bunte, is a 48 y.o. male  DOB [birth year]  MRN [id number].  Admission date:  08/10/2018  Admitting Physician  Elwyn Reach, MD  Discharge Date:  08/11/2018   Primary MD  [name], [name], PA-C  Recommendations for primary care physician for things to follow:  - please check cbc, bmp during next visit   Admission Diagnosis  Hyperkalemia [E87.5] Hypervolemia, unspecified hypervolemia type [E87.70]   Discharge Diagnosis  Hyperkalemia [E87.5] Hypervolemia, unspecified hypervolemia type [E87.70]    Principal Problem:   Hyperkalemia Active Problems:   Diabetes mellitus without complication ([place])   ESRD (end stage renal disease) on dialysis ([place])   Type 2 diabetes mellitus with complication ([place])   Tobacco abuse   Seizure ([place])      Past Medical History:  Diagnosis Date  . CHF (congestive heart failure) ([place])   . Diabetes mellitus without complication ([place])   . Hypertension   . Renal disorder   . Seizure [facility])     Past Surgical History:  Procedure Laterality Date  . AV FISTULA PLACEMENT Left 06/19/2015   Procedure: LEFT ARTERIOVENOUS (AV) FISTULA CREATION;  Surgeon: Elam Dutch, MD;  Location: [place];  Service: Vascular;  Laterality: Left;  . CAPD REMOVAL N/A 06/22/2015   Procedure: CONTINUOUS AMBULATORY PERITONEAL DIALYSIS  (CAPD) CATHETER REMOVAL;  Surgeon: Ralene Ok, MD;  Location: [place];  Service: General;  Laterality: N/A;  . ESOPHAGOGASTRODUODENOSCOPY N/A 06/21/2015   Procedure: ESOPHAGOGASTRODUODENOSCOPY (EGD);  Surgeon: Laurence Spates, MD;  Location: [facility] ENDOSCOPY;  Service: Endoscopy;  Laterality: N/A;  . INSERTION OF DIALYSIS CATHETER Left 06/19/2015   Procedure: INSERTION OF DIALYSIS CATHETER LEFT INTERNAL JUGULAR;  Surgeon: Elam Dutch, MD;  Location: [place];  Service: Vascular;  Laterality: Left;  . peritoneal dialysis catheter placed     around  2015 in [place]       History of present illness and  Hospital Course:     Kindly see H&P for history of present illness and admission details, please review complete Labs, Consult reports and Test reports for all details in brief  HPI  from the history and physical done on the day of admission 08/10/2018  HPI: Datron Brakebill is a 48 y.o. male with medical history significant of end-stage renal disease on hemodialysis, diabetes, hypertension, seizure disorder, homelessness, diastolic dysfunction CHF who has missed dialysis with his last dialysis 8 days ago.  Patient came in to the ER with shortness of breath.  He has been taking what medicines he could take but he was found to have hyperkalemia.  He is also relatively fluid overloaded.  Patient will require hemodialysis.  His inability to have hemodialysis is due to homelessness.  ER has contacted nephrology who will be preparing patient for hemodialysis tomorrow.  Patient is to be admitted to [facility] under hospitalist service.  ED Course: Temperature is 98.2 blood pressure 152/100 now 181/113 pulse 77 respiratory 39 oxygen sats 89% on room air.  White count is 4.9 and hemoglobin 12.1 with platelet 188.  Potassium was 6.2 with BUN 77 and creatinine 14.88.  Chest x-ray showed mild CHF with stable mild cardiomegaly and mild diffuse interstitial and likely airspace pulmonary edema.  There was right pleural effusion also.   Hospital Course   48 y.o.malewith medical history significant ofend-stage renal disease on hemodialysis, diabetes, hypertension, seizure disorder, homelessness, diastolic dysfunction CHF who has missed dialysis with his last dialysis 8 days for admission, who presents to ED with shortness of breath, work-up significant for hyperkalemia of 6.2, received bicarb, D50 with insulin, repeat potassium was 5.2, before dialysis patient hypoxic or tachypneic, he received dialysis today per renal.   hyperkalemia: - Secondary to  missing hemodialysis. Was admitted to telemetry bed, EKG did not show any peaked T waves, were consulted, patient received dialysis, patient potassium on admission was 6.2, it did improve this morning to 5.2 even before dialysis .  end-stage renal disease:On hemodialysis Mondays Wednesdays and Fridays. Was dialyzed today per renal, discussed with nephrologist, no indication for dialysis tomorrow, he can continue his usual schedule as an outpatient Monday Wednesday Friday.  diabetes:CBGs were controlled only with diet, he was kept on insulin sliding scale did not require any insulin, continue with carb modified diet  Tobacco abuse:Counseling provided.   seizure disorder: Denies any recent seizures, consult about medication compliance  Social worker were consulted to give patient resources community.  Discharge Condition:  Stable   Follow UP  Follow-up Information    [name], [name], PA-C Follow up in 1 week(s).   Specialties:  Internal Medicine, Emergency Medicine Contact information: [address] [place] [address] [place] [address] [phone]             Discharge Instructions  and  Discharge Medications    Discharge Instructions    Discharge instructions   Complete by:  As directed    Follow with Primary MD [name], [name], PA-C in 7 days   Get CBC, CMP,  checked  by Primary MD next visit.    Activity: As tolerated with Full fall precautions use walker/cane & assistance as needed   Disposition Home    Diet: Renal modified, with 1200 cc fluid restrictions  For Heart failure patients - Check your Weight same time everyday, if you gain over 2 pounds, or you develop in leg swelling, experience more shortness of breath or chest pain, call your Primary MD immediately. Follow Cardiac Low Salt Diet and 1.5 lit/day fluid restriction.   On your next visit with your primary care physician please Get Medicines reviewed and  adjusted.   Please request your Prim.MD to go over all Hospital Tests and Procedure/Radiological results at the follow up, please get all Hospital records sent to your Prim MD by signing hospital release before you go home.   If you experience worsening of your admission symptoms, develop shortness of breath, life threatening emergency, suicidal or homicidal thoughts you must seek medical attention immediately by calling 911 or calling your MD immediately  if symptoms less severe.  You Must read complete instructions/literature along with all the possible adverse reactions/side effects for all the Medicines you take and that have been prescribed to you. Take any new Medicines after you have completely understood and accpet all the possible adverse reactions/side effects.   Do not drive, operating heavy machinery, perform activities at heights, swimming or participation in water activities or provide baby sitting services if your were admitted for syncope or siezures until you have seen by Primary MD or a Neurologist and advised to do  so again.  Do not drive when taking Pain medications.    Do not take more than prescribed Pain, Sleep and Anxiety Medications  Special Instructions: If you have smoked or chewed Tobacco  in the last 2 yrs please stop smoking, stop any regular Alcohol  and or any Recreational drug use.  Wear Seat belts while driving.   Please note  You were cared for by a hospitalist during your hospital stay. If you have any questions about your discharge medications or the care you received while you were in the hospital after you are discharged, you can call the unit and asked to speak with the hospitalist on call if the hospitalist that took care of you is not available. Once you are discharged, your primary care physician will handle any further medical issues. Please note that NO REFILLS for any discharge medications will be authorized once you are discharged, as it is  imperative that you return to your primary care physician (or establish a relationship with a primary care physician if you do not have one) for your aftercare needs so that they can reassess your need for medications and monitor your lab values.   Increase activity slowly   Complete by:  As directed      Allergies as of 08/11/2018      Reactions   Lisinopril Shortness Of Breath   Pt's family said he had SOB, Chest pressure , and coughing       Medication List    TAKE these medications   amLODipine 10 MG tablet Commonly known as:  NORVASC Take 1 tablet (10 mg total) by mouth daily.   calcitRIOL 0.25 MCG capsule Commonly known as:  ROCALTROL Take 1 capsule (0.25 mcg total) by mouth Every Tuesday,Thursday,and Saturday with dialysis. What changed:  when to take this   calcium acetate 667 MG capsule Commonly known as:  PHOSLO Take 1,334 mg by mouth 3 (three) times daily with meals.   carvedilol 25 MG tablet Commonly known as:  COREG Take 25 mg by mouth 2 (two) times daily with a meal.   isosorbide-hydrALAZINE 20-37.5 MG tablet Commonly known as:  BIDIL Take 1 tablet by mouth 3 (three) times daily.   lanthanum 1000 MG chewable tablet Commonly known as:  FOSRENOL Chew 1 tablet (1,000 mg total) by mouth 3 (three) times daily with meals.   levETIRAcetam 500 MG tablet Commonly known as:  KEPPRA Take 500 mg by mouth 2 (two) times daily.   lidocaine-prilocaine cream Commonly known as:  EMLA Apply 1 application topically as needed (before dialysis.).   multivitamin Tabs tablet Take 1 tablet by mouth daily.   pravastatin 20 MG tablet Commonly known as:  PRAVACHOL Take 20 mg by mouth every evening.         Diet and Activity recommendation: See Discharge Instructions above   Consults obtained -  Renal   Major procedures and Radiology Reports - PLEASE review detailed and final reports for all details, in brief -    Dg Chest 2 View  Result Date:  08/10/2018 CLINICAL DATA:  Acute onset of chest pain and shortness of breath that began last night. End stage renal disease on hemodialysis. EXAM: CHEST - 2 VIEW COMPARISON:  08/10/2017 and earlier. FINDINGS: Cardiac silhouette markedly enlarged. Mild diffuse interstitial and likely airspace pulmonary edema. Hyperinflated RIGHT lung as noted previously. Chronic RIGHT pleural effusion and/or pleuroparenchymal scarring at the RIGHT base, unchanged. No visible LEFT pleural effusion. Visualized bony thorax intact. IMPRESSION: Mild CHF,  with stable marked cardiomegaly and mild diffuse interstitial and likely airspace pulmonary edema. Stable chronic RIGHT pleural effusion and/or pleuroparenchymal scarring at the RIGHT base. Electronically Signed   By: Evangeline Dakin M.D.   On: 08/10/2018 16:42    Micro Results  *  Recent Results (from the past 240 hour(s))  MRSA PCR Screening     Status: None   Collection Time: 08/11/18  1:42 AM  Result Value Ref Range Status   MRSA by PCR NEGATIVE NEGATIVE Final    Comment:        The GeneXpert MRSA Assay (FDA approved for NASAL specimens only), is one component of a comprehensive MRSA colonization surveillance program. It is not intended to diagnose MRSA infection nor to guide or monitor treatment for MRSA infections. Performed at [facility], [place] [address] [place], [place] [address]        Today   Subjective:   George [name] today has no headache,no chest OR abdominal pain,no new weakness tingling or numbness, feels much better  today.   Objective:   Blood pressure (!) 172/80, pulse 79, temperature 98.6 F (37 C), temperature source Oral, resp. rate 20, height 5\' 2"  (1.575 m), weight 103.5 kg, SpO2 100 %.   Intake/Output Summary (Last 24 hours) at 08/11/2018 1551 Last data filed at 08/11/2018 1344 Gross per 24 hour  Intake 480 ml  Output 2000 ml  Net -1520 ml    Exam Awake Alert, Oriented x 3, No new F.N deficits, Normal  affect Symmetrical Chest wall movement, Good air movement bilaterally, CTAB RRR,No Gallops,Rubs or new Murmurs, No Parasternal Heave +ve B.Sounds, Abd Soft, Non tender, No rebound -guarding or rigidity. No Cyanosis, Clubbing or edema, No new Rash or bruise  Data Review   CBC w Diff:  Lab Results  Component Value Date   WBC 5.2 08/11/2018   HGB 11.1 (L) 08/11/2018   HCT 33.4 (L) 08/11/2018   PLT 178 08/11/2018   LYMPHOPCT 19 11/21/2017   MONOPCT 12 11/21/2017   EOSPCT 6 11/21/2017   BASOPCT 1 11/21/2017    CMP:  Lab Results  Component Value Date   NA 139 08/11/2018   K 5.3 (H) 08/11/2018   CL 102 08/11/2018   CO2 21 (L) 08/11/2018   BUN 75 (H) 08/11/2018   CREATININE 15.35 (H) 08/11/2018   PROT 6.5 06/20/2015   ALBUMIN 3.3 (L) 08/11/2018   BILITOT 0.4 06/20/2015   ALKPHOS 57 06/20/2015   AST 34 06/20/2015   ALT 31 06/20/2015  .   Total Time in preparing paper work, data evaluation and todays exam - 81 minutes  Phillips Climes M.D on 08/11/2018 at 3:51 PM  [facility]   Office  [phone]

## 2018-08-11 NOTE — Clinical Social Work Note (Signed)
Clinical Social Work Assessment  Patient Details  Name: George [name] MRN: [id number] Date of Birth: [birth year]  Date of referral:  08/11/18               Reason for consult:  Housing Concerns/Homelessness                Permission sought to share information with:  Family Supports Permission granted to share information::     Name::     Mickle Plumb  Agency::     Relationship::  Mother  Contact Information:  [phone]  Housing/Transportation Living arrangements for the past 2 months:  [place] of Information:  Patient, Medical Team Patient Interpreter Needed:  None Criminal Activity/Legal Involvement Pertinent to Current Situation/Hospitalization:  No - Comment as needed Significant Relationships:  Friend, Parents Lives with:  Friends Do you feel safe going back to the place where you live?  Yes Need for family participation in patient care:  Yes (Comment)  Care giving concerns:  Homelessness and transportation to HD.   Social Worker assessment / plan:  CSW met with patient. No supports at bedside. CSW introduced role and informed patient that his mother had requested to speak to CSW about transportation to HD. Patient is currently homeless living in [place] but gets dialysis in [place]. His only transportation option would likely be Medicaid transportation. [facility] does not transport outside of the city limits. Patient is aware of Medicaid transportation. According to Nephrology note, patient is unwilling to transfer HD treatment or [place] or move to [place]. Patient was living with a friend prior to admission but cannot return and unsure what his plan will be. He gave CSW permission to call his mother. No answer and voicemail is full. Per RN, patient cannot go home with his mother due to lack of space because she is already raising two of her grandchildren. CSW went back to room to provide shelter list, emergency assistance resources, and booklets for free  meals and food pantries. Patient unable to read over it due to vision impairment but will have his family review it. Patient stated he moved here from [place] a few weeks ago and is not familiar with the bus system. CSW offered cab voucher. Patient will figure out an address to where he is going and notify RN so cab voucher can be filled out. No further concerns. CSW signing off as social work intervention is no longer needed. Patient discharging today.  Employment status:  Disabled (Comment on whether or not currently receiving Disability) Insurance information:  Programmer, applications, Medicaid In [place] PT Recommendations:  Not assessed at this time Information / Referral to community resources:  Shelter, Other (Comment Required)(Emergency assistance resources, Booklets for free meals and food pantries, cab voucher.)  Patient/Family's Response to care:  Patient agreeable to resources. Patient's mother supportive and involved in patient's care. Patient appreciated social work intervention.  Patient/Family's Understanding of and Emotional Response to Diagnosis, Current Treatment, and Prognosis:  Patient has a good understanding of the reason for admission and social work consult. Patient appears pleased with hospital care.  Emotional Assessment Appearance:  Appears stated age Attitude/Demeanor/Rapport:  Engaged, Gracious Affect (typically observed):  Accepting, Appropriate, Calm, Pleasant Orientation:  Oriented to Self, Oriented to Place, Oriented to  Time, Oriented to Situation Alcohol / Substance use:  Tobacco Use Psych involvement (Current and /or in the community):  No (Comment)  Discharge Needs  Concerns to be addressed:  Care Coordination Readmission within the last 30 days:  No Current discharge risk:  Homeless Barriers to Discharge:  No Barriers Identified   Candie Chroman, LCSW 08/11/2018, 3:51 PM

## 2018-08-11 NOTE — Care Management CC44 (Signed)
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

## 2018-08-11 NOTE — Discharge Instructions (Signed)
Follow with Primary MD [name], [name], PA-C in 7 days   Get CBC, CMP,  checked  by Primary MD next visit.    Activity: As tolerated with Full fall precautions use walker/cane & assistance as needed   Disposition Home    Diet: Renal modified,carb modified,  with 1200 cc fluid restrictions  For Heart failure patients - Check your Weight same time everyday, if you gain over 2 pounds, or you develop in leg swelling, experience more shortness of breath or chest pain, call your Primary MD immediately. Follow Cardiac Low Salt Diet and 1.5 lit/day fluid restriction.   On your next visit with your primary care physician please Get Medicines reviewed and adjusted.   Please request your Prim.MD to go over all Hospital Tests and Procedure/Radiological results at the follow up, please get all Hospital records sent to your Prim MD by signing hospital release before you go home.   If you experience worsening of your admission symptoms, develop shortness of breath, life threatening emergency, suicidal or homicidal thoughts you must seek medical attention immediately by calling 911 or calling your MD immediately  if symptoms less severe.  You Must read complete instructions/literature along with all the possible adverse reactions/side effects for all the Medicines you take and that have been prescribed to you. Take any new Medicines after you have completely understood and accpet all the possible adverse reactions/side effects.   Do not drive, operating heavy machinery, perform activities at heights, swimming or participation in water activities or provide baby sitting services if your were admitted for syncope or siezures until you have seen by Primary MD or a Neurologist and advised to do so again.  Do not drive when taking Pain medications.    Do not take more than prescribed Pain, Sleep and Anxiety Medications  Special Instructions: If you have smoked or chewed Tobacco  in the last  2 yrs please stop smoking, stop any regular Alcohol  and or any Recreational drug use.  Wear Seat belts while driving.   Please note  You were cared for by a hospitalist during your hospital stay. If you have any questions about your discharge medications or the care you received while you were in the hospital after you are discharged, you can call the unit and asked to speak with the hospitalist on call if the hospitalist that took care of you is not available. Once you are discharged, your primary care physician will handle any further medical issues. Please note that NO REFILLS for any discharge medications will be authorized once you are discharged, as it is imperative that you return to your primary care physician (or establish a relationship with a primary care physician if you do not have one) for your aftercare needs so that they can reassess your need for medications and monitor your lab values.

## 2018-08-11 NOTE — Consult Note (Signed)
Reason for Consult: To manage dialysis and dialysis related needs Referring Physician: Dr. Waldron Labs  HPI: George [name] is an 48 y.o. male with ESRD on HD, DM, HTN who presented to the ED on background of missed dialysis.  Nephrology is consulted for dialysis.   Patient tells me his home dialysis clinic is [facility] as he doesn't 'like' [facility] clinics.  He is homeless in [place] and due to lack of address does not have transportation to dialysis in [place].  He is unwilling to transfer to [facility] dialysis clinic OR move to [place].  He last dialyzed last Monday.  He has some mild chest congestion but otherwise has no particular complaints.   Dialyzes at [facility] 94kg. HD Bath 2/2.5, 450/800, Dialyzer revaclear 300, Heparin none. Access AVF. venofer 50 weekly hectorol 2.5 qtx  Past Medical History:  Diagnosis Date  . CHF (congestive heart failure) ([place])   . Diabetes mellitus without complication ([place])   . Hypertension   . Renal disorder   . Seizure [facility])     Past Surgical History:  Procedure Laterality Date  . AV FISTULA PLACEMENT Left 06/19/2015   Procedure: LEFT ARTERIOVENOUS (AV) FISTULA CREATION;  Surgeon: Elam Dutch, MD;  Location: [place];  Service: Vascular;  Laterality: Left;  . CAPD REMOVAL N/A 06/22/2015   Procedure: CONTINUOUS AMBULATORY PERITONEAL DIALYSIS  (CAPD) CATHETER REMOVAL;  Surgeon: Ralene Ok, MD;  Location: [place];  Service: General;  Laterality: N/A;  . ESOPHAGOGASTRODUODENOSCOPY N/A 06/21/2015   Procedure: ESOPHAGOGASTRODUODENOSCOPY (EGD);  Surgeon: Laurence Spates, MD;  Location: [facility] ENDOSCOPY;  Service: Endoscopy;  Laterality: N/A;  . INSERTION OF DIALYSIS CATHETER Left 06/19/2015   Procedure: INSERTION OF DIALYSIS CATHETER LEFT INTERNAL JUGULAR;  Surgeon: Elam Dutch, MD;  Location: [place];  Service: Vascular;  Laterality: Left;  . peritoneal dialysis catheter placed     around 2015 in [place]    Family  History  Problem Relation Age of Onset  . Sudden Cardiac Death Neg Hx     Social History:  reports that he has quit smoking. His smoking use included cigarettes. He has never used smokeless tobacco. He reports current drug use. Drug: Marijuana. He reports that he does not drink alcohol.  Allergies:  Allergies  Allergen Reactions  . Lisinopril Shortness Of Breath    Pt's family said he had SOB, Chest pressure , and coughing     Medications: I have reviewed the patient's current medications.  Results for orders placed or performed during the hospital encounter of 08/10/18 (from the past 48 hour(s))  Brain natriuretic peptide     Status: Abnormal   Collection Time: 08/10/18  5:20 PM  Result Value Ref Range   B Natriuretic Peptide 2,217.6 (H) 0.0 - 100.0 pg/mL    Comment: Performed at [facility], [place] [address], [place], [place] [address]  Basic metabolic panel     Status: Abnormal   Collection Time: 08/10/18  5:21 PM  Result Value Ref Range   Sodium 139 135 - 145 mmol/L   Potassium 6.2 (H) 3.5 - 5.1 mmol/L   Chloride 105 98 - 111 mmol/L   CO2 20 (L) 22 - 32 mmol/L   Glucose, Bld 76 70 - 99 mg/dL   BUN 77 (H) 6 - 20 mg/dL   Creatinine, Ser 14.88 (H) 0.61 - 1.24 mg/dL   Calcium 8.6 (L) 8.9 - 10.3 mg/dL   GFR calc non Af Amer 3 (L) >60 mL/min   GFR calc  Af Amer 4 (L) >60 mL/min   Anion gap 14 5 - 15    Comment: Performed at [facility], [place] [address], [place], [place] [address]  CBC     Status: Abnormal   Collection Time: 08/10/18  5:21 PM  Result Value Ref Range   WBC 4.9 4.0 - 10.5 K/uL   RBC 3.67 (L) 4.22 - 5.81 MIL/uL   Hemoglobin 12.1 (L) 13.0 - 17.0 g/dL   HCT 36.7 (L) 39.0 - 52.0 %   MCV 100.0 80.0 - 100.0 fL   MCH 33.0 26.0 - 34.0 pg   MCHC 33.0 30.0 - 36.0 g/dL   RDW 15.5 11.5 - 15.5 %   Platelets 188 150 - 400 K/uL   nRBC 0.0 0.0 - 0.2 %    Comment: Performed at [facility], [place] [address]  [place], [place] [address]  POCT i-Stat troponin I     Status: None   Collection Time: 08/10/18  5:25 PM  Result Value Ref Range   Troponin i, poc 0.05 0.00 - 0.08 ng/mL   Comment 3            Comment: Due to the release kinetics of cTnI, a negative result within the first hours of the onset of symptoms does not rule out myocardial infarction with certainty. If myocardial infarction is still suspected, repeat the test at appropriate intervals.   CBG monitoring, ED     Status: None   Collection Time: 08/10/18 11:33 PM  Result Value Ref Range   Glucose-Capillary 89 70 - 99 mg/dL  Glucose, capillary     Status: None   Collection Time: 08/11/18 12:18 AM  Result Value Ref Range   Glucose-Capillary 82 70 - 99 mg/dL  MRSA PCR Screening     Status: None   Collection Time: 08/11/18  1:42 AM  Result Value Ref Range   MRSA by PCR NEGATIVE NEGATIVE    Comment:        The GeneXpert MRSA Assay (FDA approved for NASAL specimens only), is one component of a comprehensive MRSA colonization surveillance program. It is not intended to diagnose MRSA infection nor to guide or monitor treatment for MRSA infections. Performed at [facility], [place] [address], [place], [place] [address]   HIV antibody (Routine Testing)     Status: None   Collection Time: 08/11/18  2:25 AM  Result Value Ref Range   HIV Screen 4th Generation wRfx Non Reactive Non Reactive    Comment: (NOTE) Performed At: [facility] [place], [place] [id number] Rush Farmer MD [id number]   CBC     Status: Abnormal   Collection Time: 08/11/18  2:25 AM  Result Value Ref Range   WBC 5.2 4.0 - 10.5 K/uL   RBC 3.49 (L) 4.22 - 5.81 MIL/uL   Hemoglobin 11.1 (L) 13.0 - 17.0 g/dL   HCT 33.4 (L) 39.0 - 52.0 %   MCV 95.7 80.0 - 100.0 fL   MCH 31.8 26.0 - 34.0 pg   MCHC 33.2 30.0 - 36.0 g/dL   RDW 15.4 11.5 - 15.5 %   Platelets 178 150 - 400 K/uL   nRBC 0.0 0.0 - 0.2 %    Comment: Performed at [facility], [place]. [address] [place], [place] [address]  Renal function panel     Status: Abnormal   Collection Time: 08/11/18  2:25 AM  Result Value Ref Range   Sodium 139 135 -  145 mmol/L   Potassium 5.3 (H) 3.5 - 5.1 mmol/L   Chloride 102 98 - 111 mmol/L   CO2 21 (L) 22 - 32 mmol/L   Glucose, Bld 134 (H) 70 - 99 mg/dL   BUN 75 (H) 6 - 20 mg/dL   Creatinine, Ser 15.35 (H) 0.61 - 1.24 mg/dL   Calcium 8.7 (L) 8.9 - 10.3 mg/dL   Phosphorus 5.7 (H) 2.5 - 4.6 mg/dL   Albumin 3.3 (L) 3.5 - 5.0 g/dL   GFR calc non Af Amer 3 (L) >60 mL/min   GFR calc Af Amer 4 (L) >60 mL/min   Anion gap 16 (H) 5 - 15    Comment: Performed at [facility] [address] [place], [place] [address]  Glucose, capillary     Status: None   Collection Time: 08/11/18  7:13 AM  Result Value Ref Range   Glucose-Capillary 93 70 - 99 mg/dL   Comment 1 Notify RN    Comment 2 Document in Chart     Dg Chest 2 View  Result Date: 08/10/2018 CLINICAL DATA:  Acute onset of chest pain and shortness of breath that began last night. End stage renal disease on hemodialysis. EXAM: CHEST - 2 VIEW COMPARISON:  08/10/2017 and earlier. FINDINGS: Cardiac silhouette markedly enlarged. Mild diffuse interstitial and likely airspace pulmonary edema. Hyperinflated RIGHT lung as noted previously. Chronic RIGHT pleural effusion and/or pleuroparenchymal scarring at the RIGHT base, unchanged. No visible LEFT pleural effusion. Visualized bony thorax intact. IMPRESSION: Mild CHF, with stable marked cardiomegaly and mild diffuse interstitial and likely airspace pulmonary edema. Stable chronic RIGHT pleural effusion and/or pleuroparenchymal scarring at the RIGHT base. Electronically Signed   By: Evangeline Dakin M.D.   On: 08/10/2018 16:42    ROS: 12 system ROS per HPI above.   Blood pressure (!) 172/80, pulse 79, temperature 98.6 F (37 C), temperature source Oral, resp. rate 20, height 5\' 2"  (1.575 m), weight 103.5 kg, SpO2 100  %. Gen: nontoxic Eyes: anicteric ENT: MMM Neck: mod elevated JVD Lungs: normal WOB, dec BS bases Abd: soft, nontender Extr: trace edema Neuro: nonfocal Psych: flat, poor eye contact  Assessment/Plan:  1. ESRD: no HD in 9 days.  Has hyperkalemia and modest volume overload. Shorten tx to 3.5hrs, dec DFR to dec risk for dysequilibrium.  He doesn't have transportation to dialysis in [place] but won't move or accept help in finding a shelter in [place].  I think it's reasonable to discharge him following his dialysis tx today.  Should he present under similar circumstances in the future, we will continue to encourage/assist him to identify a more reliable way to obtain his dialysis tx.   2.  Hyperkalemia: lokelma in ED, then HD will correct.   3.  Anemia: Hb 12.1. ok  4.  HTN:  Lack of access to meds contributing, chronic issue.    Justin Mend 08/11/2018, 3:13 PM

## 2018-08-11 NOTE — Chronic Care Management (AMB) (Signed)
  Chronic Care Management   Social Work Note  08/11/2018 Name: George [name] MRN: [id number] DOB: [birth year]  New referral received in person today by primary care provider for patient who is experiencing homelessness. Patient is currently hospitalized.  Goals Addressed    . "I need help finding a place to say" (pt-stated)       Current Barriers:  . Housing barriers - face to face discussion with PCP regarding patients stated request for assistance with housing.  Clinical Social Work Clinical Goal(s): Over the next 30 days, client will work with LCSW to address concerns related to housing concerns  Interventions: . Collaborated with RN Case Manager re: new referral and Collaborated with primary care provider re: housing needs   Plan:  . Collaboration with hospital liaison regarding patients admission and future engagement with embedded CM team.  *initial goal documentation     Follow Up Plan: On-going collaboration with hospital and care management team  Daneen Schick, Arita Miss, [place] Management / [facility] Social Worker [phone]

## 2018-08-11 NOTE — Progress Notes (Signed)
Patient arrived to room 3E26 from [facility] as a direct admit. Patient A&Ox4. No complaints of pain. BP elevated. No skin breakdown. Left AV fistula positive thrill and bruit. SR on telemetry. Low fall risk. Oriented to room and call system.

## 2018-08-11 NOTE — Care Management Obs Status (Signed)
[place] NOTIFICATION   Patient Details  Name: George [name] MRN: [id number] Date of Birth: 03-Feb-1971   Medicare Observation Status Notification Given:  Yes    Royston Bake, RN 08/11/2018, 3:19 PM

## 2018-08-11 NOTE — Progress Notes (Signed)
Patient is ready for discharge. Peripheral iv removed,clean dry and intact,pressure and dressing applied.  Discharge instructions and medication education given with teach back. Patients questions and concerns were answered. Patient denies chest pain or shortness of breath.  Taxi voucher given to patient and called for patient pick up;address given. Patients belongings with patient:clothing,hat,bag. CCMD called.

## 2018-08-11 NOTE — Telephone Encounter (Signed)
Left message I was checking on him.

## 2018-08-12 ENCOUNTER — Ambulatory Visit: Payer: Self-pay

## 2018-08-12 NOTE — Chronic Care Management (AMB) (Signed)
  Chronic Care Management   Social Work Note  08/12/2018 Name: George [name] MRN: [id number] DOB: 07-04-1970  Notified by [place] Management hospital liaison, Natividad Brood, of patients discharge from hospital. Mr. Reading was referred to Case Management services by his primary care provider Shelby Mattocks PA-C for assistance with community resource needs/housing concerns.   Unsuccessful telephone attempt to Mr. Pauley today. I left a HIPAA compliant voice message requesting a return call.   Follow Up Plan: SW will follow up with client by phone over the next 24 hours.  Daneen Schick, BSW, CDP [facility] / [facility] Care Management Social Worker [phone]

## 2018-08-13 ENCOUNTER — Ambulatory Visit: Payer: Self-pay | Admitting: *Deleted

## 2018-08-13 ENCOUNTER — Ambulatory Visit: Payer: Self-pay

## 2018-08-13 DIAGNOSIS — E119 Type 2 diabetes mellitus without complications: Secondary | ICD-10-CM

## 2018-08-13 DIAGNOSIS — H3322 Serous retinal detachment, left eye: Secondary | ICD-10-CM

## 2018-08-13 DIAGNOSIS — I5020 Unspecified systolic (congestive) heart failure: Secondary | ICD-10-CM

## 2018-08-13 DIAGNOSIS — Z992 Dependence on renal dialysis: Principal | ICD-10-CM

## 2018-08-13 DIAGNOSIS — Z598 Other problems related to housing and economic circumstances: Secondary | ICD-10-CM

## 2018-08-13 DIAGNOSIS — I10 Essential (primary) hypertension: Secondary | ICD-10-CM

## 2018-08-13 DIAGNOSIS — Z599 Problem related to housing and economic circumstances, unspecified: Secondary | ICD-10-CM

## 2018-08-13 DIAGNOSIS — N186 End stage renal disease: Secondary | ICD-10-CM

## 2018-08-13 DIAGNOSIS — E7800 Pure hypercholesterolemia, unspecified: Secondary | ICD-10-CM

## 2018-08-13 NOTE — Patient Instructions (Signed)
Licensed Clinical Social Worker Visit Information  Goals we discussed today:  Goals Addressed            This Visit's Progress   . "I need food" (pt-stated)       Current Barriers:  . Financial constraints . Limited access to food . Limited social support  Clinical Social Work Clinical Goal(s):  Marland Kitchen Over the next 10 days, client will work with SW to address concerns related to food insecurities . Over the next 30 days, client will work with SW to address concerns related to food and nutrition benefits.  Interventions: . Client interviewed and appropriate assessments performed . Advised client to continue accessing the [facility]) for hot meals. . Advised client to submit a new application regarding current wages to the food and nutrition office in efforts of increasing awarded assistance  Patient Self Care Activities:  . Calls provider office for new concerns or questions  Plan:  . Social Worker will assist the patient with updated food and nutrition services of current wage amount. . The Patient will continue accessing the [facility] for daily meals.  *initial goal documentation    . "I need help finding a place to say" (pt-stated)       Current Barriers:  . Housing barriers . Limited social support  Clinical Social Work Clinical Goal(s):  Marland Kitchen Over the next 30 days, client will work with SW to address concerns related to transitional housing. . Over the next 60 days, client will work with SW to address concerns related to permanent housing  Interventions: . Client interviewed and appropriate assessments performed  Patient Self Care Activities:  . Calls provider office for new concerns or questions   Plan:  . Social Worker will contact transitional housing regarding a safe place for patient to stay . Social Worker will assist the patient in locating affordable housing for long-term use . The patient will continue to access the  interactive resource center for daily resource assistance  Please see past updates related to this goal by clicking on the "Past Updates" button in the selected goal    . "I need transportation for dialysis (pt-stated)       Current Barriers:  . Housing barriers . Financial constraints . Limited social support  Clinical Social Work Clinical Goal(s):  Marland Kitchen Over the next 7 days, client will work with SW to address concerns related to transportation to dialysis . Over the next 30 days, client will work with SW to address concerns related to permanent housing for on going transportation to dialysis  Interventions: . Client interviewed and appropriate assessments performed . Collaborated with RN Case Manager re: barriers to attending upcomming dialysis appointment . Collaborated with [employer] (community agency) re: patient need for upcomming transportation to [facility] in [place]. Transportation arrangements confirmed.  Patient Self Care Activities:  . Calls provider office for new concerns or questions . Currently unable to independently navigate community resource needs due to an inability to read literature previosuly provided.*  Plan:  . The Patient will be ready for pick-up on Monday 08/16/18 at the designated address by 7:20 am for his 8:30 am appointment. . Social Worker will follow up with the patient within the next two business days to confirm ability to get to dialysis appointment.  *initial goal documentation       Materials provided: No -will provide materials during face to face   Mr. Magid was given information about Chronic Care Management services  today including:  1. CCM service includes personalized support from designated clinical staff supervised by his physician, including individualized plan of care and coordination with other care providers 2. 24/7 contact phone numbers for assistance for urgent and routine care needs. 3. Service will only be billed when  office clinical staff spend 20 minutes or more in a month to coordinate care. 4. Only one practitioner may furnish and bill the service in a calendar month. 5. The patient may stop CCM services at any time (effective at the end of the month) by phone call to the office staff. 6. The patient will be responsible for cost sharing (co-pay) of up to 20% of the service fee (after annual deductible is met).  Patient agreed to services and verbal consent obtained.   The patient verbalized understanding of instructions provided today and declined a print copy of patient instruction materials.   Follow up plan: Appointment scheduled for SW follow up with client by phone within the next two business days.   Daneen Schick, BSW, CDP [facility] / [facility] Care Management Social Worker [phone]

## 2018-08-13 NOTE — Chronic Care Management (AMB) (Signed)
  Care Management Note   George [name] is a 48 y.o. year old male who sees [name], [name], [place] for primary care. [name] asked the CCM team to consult the patient for assistance with chronic disease management related to ESRD.   Review of patient status, including review of consultants reports, relevant laboratory and other test results, and collaboration with appropriate care team members and the patient's provider was performed as part of comprehensive patient evaluation and provision of chronic care management services. Telephone outreach to patient today to introduce CCM services in collaboration with [name], [name].   [place] Worker reached out to [employer] by phone today.   [name] was given information about Chronic Care Management services today including:  1. CCM service includes personalized support from designated clinical staff supervised by his physician, including individualized plan of care and coordination with other care providers 2. 24/7 contact phone numbers for assistance for urgent and routine care needs. 3. Service will only be billed when office clinical staff spend 20 minutes or more in a month to coordinate care. 4. Only one practitioner may furnish and bill the service in a calendar month. 5. The patient may stop CCM services at any time (effective at the end of the month) by phone call to the office staff. 6. The patient will be responsible for cost sharing (co-pay) of up to 20% of the service fee (after annual deductible is met).  Patient agreed to services and verbal consent obtained.    Goals Addressed    . "I need help with managing my dialysis" (pt-stated)       Current Barriers:  Marland Kitchen Knowledge Deficits related to ESRD disease process and need for long term self health management plans and plan of care . Homelessness . Transportation Barriers . Financial Barriers  Nurse Case Manager  Clinical Goal(s): Over the next 30 days, patient will work with the CCM team to establish a plan of care for long term management of ESRD/Dialysis  Interventions:  . Collaboration with clinic social worker to engage patient re: acute needs related to dialysis  Patient Self Care Activities:  . Patient independently performs ADL's  *initial goal documentation    Follow Up Plan: Telephone follow up appointment with CCM team member scheduled for: Monday 08/16/18 to further discuss long term plan for management of ESRD.   [place] Nurse Care Coordinator [facility] Care Management [phone]

## 2018-08-13 NOTE — Patient Instructions (Signed)
Visit Information  Goals    . "I need help finding a place to say" (pt-stated)     Current Barriers:  . Housing barriers - face to face discussion with PCP regarding patients stated request for assistance with housing.  Clinical Social Work Clinical Goal(s): Over the next 30 days, client will work with LCSW to address concerns related to housing concerns  Interventions: . Collaborated with RN Case Manager re: new referral and Collaborated with primary care provider re: housing needs    Plan:  . Collaboration with hospital liaison regarding patients admission and future engagement with embedded CM team.  *initial goal documentation     . "I need help with managing my dialysis" (pt-stated)     Current Barriers:  Marland Kitchen Knowledge Deficits related to ESRD disease process and need for long term self health management plans and plan of care . Homelessness . Transportation Barriers . Financial Barriers  Nurse Case Manager Clinical Goal(s): Over the next 30 days, patient will work with the CCM team to establish a plan of care for long term management of ESRD/Dialysis  Interventions:  . Collaboration with clinic social worker to engage patient re: acute needs related to dialysis   Patient Self Care Activities:  . Patient independently performs ADL's  *initial goal documentation     . "I need transportation for dialysis (pt-stated)     Current Barriers:  . Housing barriers . Financial constraints . Limited social support  Clinical Social Work Clinical Goal(s):  Marland Kitchen Over the next 7 days, client will work with SW to address concerns related to transportation to dialysis . Over the next 30 days, client will work with SW to address concerns related to permanent housing for on going transportation to dialysis   Interventions: . Client interviewed and appropriate assessments performed . Collaborated with RN Case Manager re: barriers to attending upcomming dialysis  appointment . Collaborated with [employer] (community agency) re: patient need for upcomming transportation to [employer] in [place]. Transportation arrangements confirmed.  Patient Self Care Activities:  . Calls provider office for new concerns or questions . Currently unable to independently navigate community resource needs due to an inability to read literature previosuly provided.*  Plan:  . The Patient will be ready for pick-up on Monday 08/16/18 at the designated address by 7:20 am for his 8:30 am appointment. . Social Worker will follow up with the patient within the next two business days to confirm ability to get to dialysis appointment.  *initial goal documentation      Education or Materials Provided:  . None - low vision  Mr. Capo was given information about Chronic Care Management services today including:  1. CCM service includes personalized support from designated clinical staff supervised by his physician, including individualized plan of care and coordination with other care providers 2. 24/7 contact phone numbers for assistance for urgent and routine care needs. 3. Service will only be billed when office clinical staff spend 20 minutes or more in a month to coordinate care. 4. Only one practitioner may furnish and bill the service in a calendar month. 5. The patient may stop CCM services at any time (effective at the end of the month) by phone call to the office staff. 6. The patient will be responsible for cost sharing (co-pay) of up to 20% of the service fee (after annual deductible is met).  Patient agreed to services and verbal consent obtained.   The patient verbalized understanding of instructions provided today and  declined a print copy of patient instruction materials.    Telephone follow up appointment with CCM team member scheduled for: 08/16/18  Janalyn Shy [facility] Nurse Care Coordinator [facility] Care  Management [phone]

## 2018-08-13 NOTE — Chronic Care Management (AMB) (Signed)
Chronic Care Management    Clinical Social Work General Note  08/13/2018 Name: George [name] MRN: [id number] DOB: 18-Nov-1970   Referred by: Neoma Laming [name], PA-C  To CCM RNCM and Social Worker related to current health conditions and housing barriers.  Mr. Edgerly was given information about Chronic Care Management services today including:  1. CCM service includes personalized support from designated clinical staff supervised by his physician, including individualized plan of care and coordination with other care providers 2. 24/7 contact phone numbers for assistance for urgent and routine care needs. 3. Service will only be billed when office clinical staff spend 20 minutes or more in a month to coordinate care. 4. Only one practitioner may furnish and bill the service in a calendar month. 5. The patient may stop CCM services at any time (effective at the end of the month) by phone call to the office staff. 6. The patient will be responsible for cost sharing (co-pay) of up to 20% of the service fee (after annual deductible is met).  Patient agreed to services and verbal consent obtained.   Review of patient status, including review of consultants reports, and collaboration with appropriate care team members and the patient's provider was performed as part of comprehensive patient evaluation and provision of chronic care management services.    Successful outreach to the patient by phone on today's date. HIPAA identifiers confirmed. The patient states he is agreeable to CCM services and would like assistance with barriers related to food, housing and transportation. The patient recently discharged and is currently living "outside". The patient denies local social supports to assist with housing at this time. The patient reports he is in kidney failure and attends dialysis at [facility] in [place] M,W,F. The patient does not have his own vehicle and relies on his transportation  benefit under his Medicaid plan. The patient is currently homeless and living "outside". The patient reports he is willing to move to a separate town and/or county if appropriate housing is identified. The patient is currently accessing the [employer] for meals. The patient reports he does receive assistance from [employer] in the amount of $16.00 per month. The patient relies on income from disability but reports his wages are garnished due to child support. The patient denies updating income with [employer] but is willing to provide a garnished wage report to see if his benefit would be increased.     Goals Addressed    . "I need food" (pt-stated)       Current Barriers:  . Financial constraints . Limited access to food . Limited social support  Clinical Social Work Clinical Goal(s):  Marland Kitchen Over the next 10 days, client will work with SW to address concerns related to food insecurities . Over the next 30 days, client will work with SW to address concerns related to food and nutrition benefits.  Interventions: . Client interviewed and appropriate assessments performed . Advised client to continue accessing the [facility]) for hot meals. . Advised client to submit a new application regarding current wages to the food and nutrition office in efforts of increasing awarded assistance  Patient Self Care Activities:  . Calls provider office for new concerns or questions  Plan:  . Social Worker will assist the patient with updated food and nutrition services of current wage amount. . The Patient will continue accessing the [facility] for daily meals.  *initial goal documentation    . "  I need help finding a place to say" (pt-stated)       Current Barriers:  . Housing barriers . Limited social support  Clinical Social Work Clinical Goal(s):  Marland Kitchen Over the next 30 days, client will work with SW to address  concerns related to transitional housing. . Over the next 60 days, client will work with SW to address concerns related to permanent housing  Interventions: . Client interviewed and appropriate assessments performed  Patient Self Care Activities:  . Calls provider office for new concerns or questions   Plan:  . Social Worker will contact transitional housing regarding a safe place for patient to stay . Social Worker will assist the patient in locating affordable housing for long-term use . The patient will continue to access the interactive resource center for daily resource assistance  Please see past updates related to this goal by clicking on the "Past Updates" button in the selected goal     . "I need transportation for dialysis (pt-stated)       Current Barriers:  . Housing barriers . Financial constraints . Limited social support  Clinical Social Work Clinical Goal(s):  Marland Kitchen Over the next 7 days, client will work with SW to address concerns related to transportation to dialysis . Over the next 30 days, client will work with SW to address concerns related to permanent housing for on going transportation to dialysis   Interventions: . Client interviewed and appropriate assessments performed . Collaborated with RN Case Manager re: barriers to attending upcomming dialysis appointment . Collaborated with [employer] (community agency) re: patient need for upcomming transportation to [employer] in [place]. Transportation arrangements confirmed.  Patient Self Care Activities:  . Calls provider office for new concerns or questions . Currently unable to independently navigate community resource needs due to an inability to read literature previosuly provided.*  Plan:  . The Patient will be ready for pick-up on Monday 08/16/18 at the designated address by 7:20 am for his 8:30 am appointment. . Social Worker will follow up with the patient within the next two business days to  confirm ability to get to dialysis appointment.  *initial goal documentation       Follow Up Plan: Appointment scheduled for SW follow up with client by phone within the next two business days.       Daneen Schick, BSW, CDP [facility] / [facility] Care Management Social Worker [phone]

## 2018-08-16 ENCOUNTER — Emergency Department (HOSPITAL_COMMUNITY): Payer: Medicare HMO

## 2018-08-16 ENCOUNTER — Other Ambulatory Visit: Payer: Self-pay

## 2018-08-16 ENCOUNTER — Inpatient Hospital Stay (HOSPITAL_COMMUNITY)
Admission: EM | Admit: 2018-08-16 | Discharge: 2018-08-30 | DRG: 640 | Disposition: A | Discharge status: Home / Self Care | Payer: Medicare HMO | Attending: Internal Medicine | Admitting: Internal Medicine

## 2018-08-16 ENCOUNTER — Telehealth: Payer: Self-pay

## 2018-08-16 ENCOUNTER — Other Ambulatory Visit (HOSPITAL_COMMUNITY): Payer: Self-pay

## 2018-08-16 ENCOUNTER — Ambulatory Visit: Payer: Self-pay

## 2018-08-16 ENCOUNTER — Encounter (HOSPITAL_COMMUNITY): Payer: Self-pay | Admitting: Emergency Medicine

## 2018-08-16 DIAGNOSIS — D631 Anemia in chronic kidney disease: Secondary | ICD-10-CM | POA: Diagnosis not present

## 2018-08-16 DIAGNOSIS — E8770 Fluid overload, unspecified: Secondary | ICD-10-CM | POA: Diagnosis not present

## 2018-08-16 DIAGNOSIS — E875 Hyperkalemia: Secondary | ICD-10-CM | POA: Diagnosis present

## 2018-08-16 DIAGNOSIS — Z992 Dependence on renal dialysis: Secondary | ICD-10-CM

## 2018-08-16 DIAGNOSIS — Z87891 Personal history of nicotine dependence: Secondary | ICD-10-CM | POA: Diagnosis not present

## 2018-08-16 DIAGNOSIS — E785 Hyperlipidemia, unspecified: Secondary | ICD-10-CM | POA: Diagnosis present

## 2018-08-16 DIAGNOSIS — E161 Other hypoglycemia: Secondary | ICD-10-CM | POA: Diagnosis not present

## 2018-08-16 DIAGNOSIS — Z9119 Patient's noncompliance with other medical treatment and regimen: Secondary | ICD-10-CM

## 2018-08-16 DIAGNOSIS — R69 Illness, unspecified: Secondary | ICD-10-CM | POA: Diagnosis not present

## 2018-08-16 DIAGNOSIS — I132 Hypertensive heart and chronic kidney disease with heart failure and with stage 5 chronic kidney disease, or end stage renal disease: Secondary | ICD-10-CM | POA: Diagnosis present

## 2018-08-16 DIAGNOSIS — K0889 Other specified disorders of teeth and supporting structures: Secondary | ICD-10-CM

## 2018-08-16 DIAGNOSIS — R197 Diarrhea, unspecified: Secondary | ICD-10-CM | POA: Diagnosis not present

## 2018-08-16 DIAGNOSIS — R0602 Shortness of breath: Secondary | ICD-10-CM | POA: Diagnosis not present

## 2018-08-16 DIAGNOSIS — G40909 Epilepsy, unspecified, not intractable, without status epilepticus: Secondary | ICD-10-CM | POA: Diagnosis present

## 2018-08-16 DIAGNOSIS — R0902 Hypoxemia: Secondary | ICD-10-CM | POA: Diagnosis present

## 2018-08-16 DIAGNOSIS — R45851 Suicidal ideations: Secondary | ICD-10-CM | POA: Diagnosis present

## 2018-08-16 DIAGNOSIS — K083 Retained dental root: Secondary | ICD-10-CM | POA: Diagnosis present

## 2018-08-16 DIAGNOSIS — E669 Obesity, unspecified: Secondary | ICD-10-CM | POA: Diagnosis present

## 2018-08-16 DIAGNOSIS — F321 Major depressive disorder, single episode, moderate: Secondary | ICD-10-CM

## 2018-08-16 DIAGNOSIS — R569 Unspecified convulsions: Secondary | ICD-10-CM

## 2018-08-16 DIAGNOSIS — J810 Acute pulmonary edema: Secondary | ICD-10-CM | POA: Diagnosis present

## 2018-08-16 DIAGNOSIS — G4700 Insomnia, unspecified: Secondary | ICD-10-CM | POA: Diagnosis present

## 2018-08-16 DIAGNOSIS — I120 Hypertensive chronic kidney disease with stage 5 chronic kidney disease or end stage renal disease: Secondary | ICD-10-CM | POA: Diagnosis not present

## 2018-08-16 DIAGNOSIS — Z9114 Patient's other noncompliance with medication regimen: Secondary | ICD-10-CM

## 2018-08-16 DIAGNOSIS — E162 Hypoglycemia, unspecified: Secondary | ICD-10-CM | POA: Diagnosis not present

## 2018-08-16 DIAGNOSIS — J9601 Acute respiratory failure with hypoxia: Secondary | ICD-10-CM | POA: Diagnosis present

## 2018-08-16 DIAGNOSIS — I509 Heart failure, unspecified: Secondary | ICD-10-CM | POA: Diagnosis present

## 2018-08-16 DIAGNOSIS — Z9115 Patient's noncompliance with renal dialysis: Secondary | ICD-10-CM

## 2018-08-16 DIAGNOSIS — R6884 Jaw pain: Secondary | ICD-10-CM | POA: Diagnosis not present

## 2018-08-16 DIAGNOSIS — K029 Dental caries, unspecified: Secondary | ICD-10-CM | POA: Diagnosis present

## 2018-08-16 DIAGNOSIS — Z590 Homelessness: Secondary | ICD-10-CM

## 2018-08-16 DIAGNOSIS — I2510 Atherosclerotic heart disease of native coronary artery without angina pectoris: Secondary | ICD-10-CM | POA: Diagnosis present

## 2018-08-16 DIAGNOSIS — Z6841 Body Mass Index (BMI) 40.0 and over, adult: Secondary | ICD-10-CM

## 2018-08-16 DIAGNOSIS — E1122 Type 2 diabetes mellitus with diabetic chronic kidney disease: Secondary | ICD-10-CM | POA: Diagnosis present

## 2018-08-16 DIAGNOSIS — J80 Acute respiratory distress syndrome: Secondary | ICD-10-CM | POA: Diagnosis not present

## 2018-08-16 DIAGNOSIS — Z79899 Other long term (current) drug therapy: Secondary | ICD-10-CM | POA: Diagnosis not present

## 2018-08-16 DIAGNOSIS — N2581 Secondary hyperparathyroidism of renal origin: Secondary | ICD-10-CM | POA: Diagnosis not present

## 2018-08-16 DIAGNOSIS — F322 Major depressive disorder, single episode, severe without psychotic features: Secondary | ICD-10-CM

## 2018-08-16 DIAGNOSIS — K045 Chronic apical periodontitis: Secondary | ICD-10-CM | POA: Diagnosis present

## 2018-08-16 DIAGNOSIS — N186 End stage renal disease: Secondary | ICD-10-CM

## 2018-08-16 DIAGNOSIS — I10 Essential (primary) hypertension: Secondary | ICD-10-CM | POA: Diagnosis not present

## 2018-08-16 DIAGNOSIS — J9600 Acute respiratory failure, unspecified whether with hypoxia or hypercapnia: Secondary | ICD-10-CM | POA: Diagnosis present

## 2018-08-16 LAB — BASIC METABOLIC PANEL
Anion gap: 17 — ABNORMAL HIGH (ref 5–15)
BUN: 77 mg/dL — ABNORMAL HIGH (ref 6–20)
CALCIUM: 9 mg/dL (ref 8.9–10.3)
CO2: 21 mmol/L — ABNORMAL LOW (ref 22–32)
Chloride: 102 mmol/L (ref 98–111)
Creatinine, Ser: 17.2 mg/dL — ABNORMAL HIGH (ref 0.61–1.24)
GFR calc Af Amer: 3 mL/min — ABNORMAL LOW (ref >60)
GFR calc non Af Amer: 3 mL/min — ABNORMAL LOW (ref >60)
Glucose, Bld: 75 mg/dL (ref 70–99)
Potassium: 7.2 mmol/L (ref 3.5–5.1)
Sodium: 140 mmol/L (ref 135–145)

## 2018-08-16 LAB — CBC
HCT: 39.6 % (ref 39.0–52.0)
Hemoglobin: 12.6 g/dL — ABNORMAL LOW (ref 13.0–17.0)
MCH: 31.7 pg (ref 26.0–34.0)
MCHC: 31.8 g/dL (ref 30.0–36.0)
MCV: 99.5 fL (ref 80.0–100.0)
Platelets: 177 10*3/uL (ref 150–400)
RBC: 3.98 MIL/uL — ABNORMAL LOW (ref 4.22–5.81)
RDW: 15.5 % (ref 11.5–15.5)
WBC: 5.5 10*3/uL (ref 4.0–10.5)
nRBC: 0 % (ref 0.0–0.2)

## 2018-08-16 LAB — GLUCOSE, CAPILLARY
GLUCOSE-CAPILLARY: 40 mg/dL — AB (ref 70–99)
Glucose-Capillary: 59 mg/dL — ABNORMAL LOW (ref 70–99)
Glucose-Capillary: 68 mg/dL — ABNORMAL LOW (ref 70–99)
Glucose-Capillary: 129 mg/dL — ABNORMAL HIGH (ref 70–99)

## 2018-08-16 MED ORDER — SODIUM ZIRCONIUM CYCLOSILICATE 10 G PO PACK
10.0000 g | PACK | Freq: Once | ORAL | Status: DC
  Filled 2018-08-16: qty 1

## 2018-08-16 MED ORDER — LANTHANUM CARBONATE 500 MG PO CHEW
1000.0000 mg | CHEWABLE_TABLET | Freq: Three times a day (TID) | ORAL | Status: DC
  Administered 2018-08-16 – 2018-08-30 (×25): 1000 mg via ORAL
  Filled 2018-08-16 (×32): qty 2

## 2018-08-16 MED ORDER — ACETAMINOPHEN 650 MG RE SUPP: 650.0000 mg | Freq: Four times a day (QID) | RECTAL | Status: DC | PRN

## 2018-08-16 MED ORDER — HYDRALAZINE HCL 20 MG/ML IJ SOLN: 10.0000 mg | Freq: Once | INTRAMUSCULAR | Status: DC

## 2018-08-16 MED ORDER — RENA-VITE PO TABS
1.0000 | ORAL_TABLET | Freq: Every day | ORAL | Status: DC
  Administered 2018-08-17 – 2018-08-29 (×11): 1 via ORAL
  Filled 2018-08-16 (×14): qty 1

## 2018-08-16 MED ORDER — HEPARIN SODIUM (PORCINE) 1000 UNIT/ML DIALYSIS
3500.0000 [IU] | INTRAMUSCULAR | Status: DC | PRN
  Administered 2018-08-16: 3500 [IU] via INTRAVENOUS_CENTRAL

## 2018-08-16 MED ORDER — PRAVASTATIN SODIUM 10 MG PO TABS
20.0000 mg | ORAL_TABLET | Freq: Every evening | ORAL | Status: DC
  Administered 2018-08-16 – 2018-08-30 (×15): 20 mg via ORAL
  Filled 2018-08-16 (×16): qty 2

## 2018-08-16 MED ORDER — ISOSORB DINITRATE-HYDRALAZINE 20-37.5 MG PO TABS
1.0000 | ORAL_TABLET | Freq: Three times a day (TID) | ORAL | Status: DC
  Administered 2018-08-16 – 2018-08-30 (×40): 1 via ORAL
  Filled 2018-08-16 (×43): qty 1

## 2018-08-16 MED ORDER — CHLORHEXIDINE GLUCONATE CLOTH 2 % EX PADS
6.0000 | MEDICATED_PAD | Freq: Every day | CUTANEOUS | Status: DC
  Administered 2018-08-18: 6 via TOPICAL

## 2018-08-16 MED ORDER — CALCIUM GLUCONATE-NACL 1-0.675 GM/50ML-% IV SOLN
1.0000 g | Freq: Once | INTRAVENOUS | Status: AC
  Administered 2018-08-16: 1000 mg via INTRAVENOUS
  Filled 2018-08-16: qty 50

## 2018-08-16 MED ORDER — ONDANSETRON HCL 4 MG/2ML IJ SOLN: 4.0000 mg | Freq: Four times a day (QID) | INTRAMUSCULAR | Status: DC | PRN

## 2018-08-16 MED ORDER — CARVEDILOL 25 MG PO TABS
25.0000 mg | ORAL_TABLET | Freq: Two times a day (BID) | ORAL | Status: DC
  Administered 2018-08-16 – 2018-08-30 (×27): 25 mg via ORAL
  Filled 2018-08-16 (×28): qty 1

## 2018-08-16 MED ORDER — LEVETIRACETAM 500 MG PO TABS
500.0000 mg | ORAL_TABLET | Freq: Two times a day (BID) | ORAL | Status: DC
  Administered 2018-08-17 – 2018-08-30 (×27): 500 mg via ORAL
  Filled 2018-08-16 (×28): qty 1

## 2018-08-16 MED ORDER — DEXTROSE 50 % IV SOLN
1.0000 | Freq: Once | INTRAVENOUS | Status: AC
  Administered 2018-08-16: 50 mL via INTRAVENOUS
  Filled 2018-08-16: qty 50

## 2018-08-16 MED ORDER — CALCIUM GLUCONATE 10 % IV SOLN
1.0000 g | Freq: Once | INTRAVENOUS | Status: DC
  Filled 2018-08-16: qty 10

## 2018-08-16 MED ORDER — LIDOCAINE-PRILOCAINE 2.5-2.5 % EX CREA
1.0000 "application " | TOPICAL_CREAM | CUTANEOUS | Status: DC | PRN
  Filled 2018-08-16: qty 5

## 2018-08-16 MED ORDER — HEPARIN SODIUM (PORCINE) 5000 UNIT/ML IJ SOLN
5000.0000 [IU] | Freq: Three times a day (TID) | INTRAMUSCULAR | Status: DC
  Administered 2018-08-21: 5000 [IU] via SUBCUTANEOUS
  Filled 2018-08-16 (×10): qty 1

## 2018-08-16 MED ORDER — AMLODIPINE BESYLATE 5 MG PO TABS
10.0000 mg | ORAL_TABLET | Freq: Once | ORAL | Status: AC
  Administered 2018-08-16: 10 mg via ORAL
  Filled 2018-08-16: qty 2

## 2018-08-16 MED ORDER — ACETAMINOPHEN 325 MG PO TABS
650.0000 mg | ORAL_TABLET | Freq: Four times a day (QID) | ORAL | Status: DC | PRN
  Filled 2018-08-16: qty 2

## 2018-08-16 MED ORDER — HEPARIN SODIUM (PORCINE) 1000 UNIT/ML IJ SOLN
INTRAMUSCULAR | Status: AC
  Administered 2018-08-16: 3500 [IU] via INTRAVENOUS_CENTRAL
  Filled 2018-08-16: qty 4

## 2018-08-16 MED ORDER — INSULIN ASPART 100 UNIT/ML IV SOLN
5.0000 [IU] | Freq: Once | INTRAVENOUS | Status: AC
  Administered 2018-08-16: 5 [IU] via INTRAVENOUS

## 2018-08-16 MED ORDER — AMLODIPINE BESYLATE 10 MG PO TABS
10.0000 mg | ORAL_TABLET | Freq: Every day | ORAL | Status: DC
  Administered 2018-08-17 – 2018-08-27 (×11): 10 mg via ORAL
  Filled 2018-08-16 (×11): qty 1

## 2018-08-16 MED ORDER — SODIUM POLYSTYRENE SULFONATE 15 GM/60ML PO SUSP: 30.0000 g | Freq: Once | ORAL | Status: DC

## 2018-08-16 MED ORDER — ONDANSETRON HCL 4 MG PO TABS: 4.0000 mg | ORAL_TABLET | Freq: Four times a day (QID) | ORAL | Status: DC | PRN

## 2018-08-16 MED ORDER — CALCIUM ACETATE (PHOS BINDER) 667 MG PO CAPS
1334.0000 mg | ORAL_CAPSULE | Freq: Three times a day (TID) | ORAL | Status: DC
  Administered 2018-08-16 – 2018-08-30 (×34): 1334 mg via ORAL
  Filled 2018-08-16 (×35): qty 2

## 2018-08-16 NOTE — ED Triage Notes (Signed)
Pt presented to the ED from home via [facility]. [facility] arrived first and reported pt was SOB SpO2 85%. [facility] upon transport said pt was SpO2 of 95 RA. Pt reports waking up feeling SOB  And bilateral leg swelling. Pt on HD  MWF but could not go today d/t transportation issues. Last dialyzed 08/12/18.

## 2018-08-16 NOTE — Discharge Planning (Signed)
EDCM reached out [facility] Liaison to get update on pt.  [facility] very familiar with pt as they have recently arranged transportation through [employer] from [place].  Pt made aware of transportation cooridination and agreed to terms. CM and [facility] will continue to follow for disposition needs.

## 2018-08-16 NOTE — Consult Note (Signed)
Renal Service Consult Note [facility]  George [name] 08/16/2018 [name] Requesting Physician:  Dr Cathlean Sauer  Reason for Consult:  ESRD pt missed HD, SOB HPI: The patient is a 48 y.o. year-old with hx of HTN, ESRD and seizure d/o presented to ED w/ SOB.  SpO2 was 85% per EMS. Bilat leg swelling+. Has HD MWF in [facility] but says he couldn't go today because of transportation issues.  CXR in ED showing early CHF.  On nasal O2, coughing some, no CP or fevers.   He has transportation set up through [employer] which is a Chiropractor.  He doesn't have a place to live.  Called [facility] staff who told me all he has to do is call them the day before or the day of dialysis and give them an address where he can picked up and the HD unit will contact the transportation and set it up.    Patient started HD in 2013 in [place].  He had some HD in [place] last year for a few months at [facility] with [facility].    ROS  denies CP  no joint pain   no HA  no blurry vision  no rash  no diarrhea  no nausea/ vomiting    Past Medical History  Past Medical History:  Diagnosis Date  . CHF (congestive heart failure) ([place])   . Diabetes mellitus without complication ([place])   . Hypertension   . Renal disorder   . Seizure [facility])    Past Surgical History  Past Surgical History:  Procedure Laterality Date  . AV FISTULA PLACEMENT Left 06/19/2015   Procedure: LEFT ARTERIOVENOUS (AV) FISTULA CREATION;  Surgeon: Elam Dutch, MD;  Location: [facility];  Service: Vascular;  Laterality: Left;  . CAPD REMOVAL N/A 06/22/2015   Procedure: CONTINUOUS AMBULATORY PERITONEAL DIALYSIS  (CAPD) CATHETER REMOVAL;  Surgeon: Ralene Ok, MD;  Location: [facility];  Service: General;  Laterality: N/A;  . ESOPHAGOGASTRODUODENOSCOPY N/A 06/21/2015   Procedure: ESOPHAGOGASTRODUODENOSCOPY (EGD);  Surgeon: Laurence Spates, MD;  Location[facility] ENDOSCOPY;  Service: Endoscopy;  Laterality: N/A;  .  INSERTION OF DIALYSIS CATHETER Left 06/19/2015   Procedure: INSERTION OF DIALYSIS CATHETER LEFT INTERNAL JUGULAR;  Surgeon: Elam Dutch, MD;  Location: [facility];  Service: Vascular;  Laterality: Left;  . peritoneal dialysis catheter placed     around 2015 in [place]   Family History  Family History  Problem Relation Age of Onset  . Sudden Cardiac Death Neg Hx    Social History  reports that he has quit smoking. His smoking use included cigarettes. He has never used smokeless tobacco. He reports current drug use. Drug: Marijuana. He reports that he does not drink alcohol. Allergies  Allergies  Allergen Reactions  . Lisinopril Shortness Of Breath    Pt's family said he had SOB, Chest pressure , and coughing    Home medications Prior to Admission medications   Medication Sig Start Date End Date Taking? Authorizing Provider  amLODipine (NORVASC) 10 MG tablet Take 1 tablet (10 mg total) by mouth daily. 07/27/18 07/27/19 Yes [name], [name], PA-C  calcitRIOL (ROCALTROL) 0.25 MCG capsule Take 1 capsule (0.25 mcg total) by mouth Every Tuesday,Thursday,and Saturday with dialysis. Patient taking differently: Take 0.25 mcg by mouth every Monday, Wednesday, and Friday.  06/26/15  Yes Hosie Poisson, MD  calcium acetate (PHOSLO) 667 MG capsule Take 1,334 mg by mouth 3 (three) times daily with meals.   Yes [provider]  carvedilol (COREG) 25 MG tablet Take 25 mg by mouth 2 (two) times daily with a meal.   Yes [provider]  isosorbide-hydrALAZINE (BIDIL) 20-37.5 MG tablet Take 1 tablet by mouth 3 (three) times daily. 07/27/18  Yes [name], [name], PA-C  lanthanum (FOSRENOL) 1000 MG chewable tablet Chew 1 tablet (1,000 mg total) by mouth 3 (three) times daily with meals. 08/18/17  Yes Regalado, Belkys A, MD  levETIRAcetam (KEPPRA) 500 MG tablet Take 500 mg by mouth 2 (two) times daily.   Yes [provider]  lidocaine-prilocaine (EMLA) cream Apply  1 application topically as needed (before dialysis.).   Yes [provider]  multivitamin (RENA-VIT) TABS tablet Take 1 tablet by mouth daily.   Yes [provider]  pravastatin (PRAVACHOL) 20 MG tablet Take 20 mg by mouth every evening.   Yes [provider]   Liver Function Tests Recent Labs  Lab 08/11/18 0225  ALBUMIN 3.3*   No results for input(s): LIPASE, AMYLASE in the last 168 hours. CBC Recent Labs  Lab 08/10/18 1721 08/11/18 0225 08/16/18 1156  WBC 4.9 5.2 5.5  HGB 12.1* 11.1* 12.6*  HCT 36.7* 33.4* 39.6  MCV 100.0 95.7 99.5  PLT 188 178 329   Basic Metabolic Panel Recent Labs  Lab 08/10/18 1721 08/11/18 0225 08/16/18 1156  NA 139 139 140  K 6.2* 5.3* 7.2*  CL 105 102 102  CO2 20* 21* 21*  GLUCOSE 76 134* 75  BUN 77* 75* 77*  CREATININE 14.88* 15.35* 17.20*  CALCIUM 8.6* 8.7* 9.0  PHOS  --  5.7*  --    Iron/TIBC/Ferritin/ %Sat    Component Value Date/Time   IRON 145 06/16/2015 1352   TIBC 200 (L) 06/16/2015 1352   FERRITIN 846 (H) 06/16/2015 1352   IRONPCTSAT 72 (H) 06/16/2015 1352    Vitals:   08/16/18 1354 08/16/18 1355 08/16/18 1536 08/16/18 1538  BP: (!) 198/130 (!) 198/130  (!) 205/111  Pulse: (!) 102 (!) 105  81  Resp: (!) 21 (!) 21  19  Temp:    97.9 F (36.6 C)  TempSrc:    Oral  SpO2: 91% 90%  98%  Weight:   103 kg   Height:   5\' 2"  (1.575 m)    Exam Gen alert, mild SOB, no distress, calm No rash, cyanosis or gangrene Sclera anicteric, throat clear  +jvd to angle of jaw Chest rales bilat bases, no wheezing RRR no MRG Abd soft ntnd no mass or ascites +bs GU normal male MS no joint effusions or deformity Ext dense 2+ bilat pretib edema, no wounds or ulcers Neuro is alert, Ox 3 , nf  LUA AVF    Home meds:  - amlodipine 10/ carvedilol 25 bid/ isosorbide-hydralazine 20-37.5 bid  - pravastatin 20/ calc acetate 2 ac tid/ lanthanum 1gm ac  - levetiracetam 500 bid   Dialysis: [facility] MWF    4.5h    94.5kg  2/2.5 bath  14ga  400/800  Hep none  LUA AVF     Assessment: 1. SOB/ vol overload/ pulm edema: due to missed HD, pt homeless. Per HD staff at [place] he has transportation arranged which will pick him up anywhere, will d/w patient. Pt dyspneic but not in distress. Plan HD later this afternoon or this evening, max UF and get vol down.  2. Hyperkalemia - no sig EKG changes, use low K+ bath w/ HD today 3. ESRD on HD MWF 4. HTN 5. Seizure d/o 6. Anemia  ckd - Hb 12 no need for esa 7. MBD ckd - cont meds    P: 1. As above      [facility] 08/16/2018, 3:40 PM

## 2018-08-16 NOTE — Chronic Care Management (AMB) (Signed)
  Chronic Care Management   Social Work Note  08/16/2018 Name: George [name] MRN: [id number] DOB: 14-Jun-1971  SW planned outreach to the patient to confirm attended scheduled dialysis. Upon chart review it is noted the patient is currently in the ED. SW sent an in-basket message to [facility] Liaison updating of patients disposition.  Follow Up Plan: SW will reach out to client by phone once discharged.  Daneen Schick, BSW, CDP [facility] [phone]

## 2018-08-16 NOTE — ED Notes (Signed)
Patient transported to X-ray 

## 2018-08-16 NOTE — Progress Notes (Signed)
HD tx initiated via 15Gx2 w/o problem Pull/push/flush well w/o problem VSS Will continue to monitor while on HD tx 

## 2018-08-16 NOTE — Consult Note (Signed)
   [facility] CM Inpatient Consult   08/16/2018  Nazeer Romney 03/29/1971 [id number]   Alerted by [facility] staff of patient's hospitalization.  Spoke with Rosendo Gros in ED regarding ongoing issues/needs.  Will follow up.  Natividad Brood, RN BSN [facility] Liaison  [phone] business mobile phone Toll free office [phone]

## 2018-08-16 NOTE — H&P (Addendum)
History and Physical    George [name] GQB:[id number] DOB: 06-Nov-1970 DOA: 08/16/2018  PCP: Shelby Mattocks, PA-C   Patient coming from: Home   Chief Complaint: dyspnea  HPI: George [name] is a 48 y.o. male with medical history significant of end-stage renal disease on hemodialysis.  Patient presents with 3 days history of worsening dyspnea, moderate to severe intensity, associated with generalized weakness, and lower extremity edema.  No improving or worsening factors.  Patient was discharged from the hospital February 12, day of his last hemodialysis after being admitted for hyperkalemia related to noncompliance with hemodialysis.  He reports having problems getting transportation to [place] where he gets his regular hemodialysis treatments.  His schedule is Monday Wednesday Friday.  He does have a left upper extremity fistula.   ED Course: Patient was found hypervolemic, hyperkalemic, decision was made to admit patient for hemodialysis treatment.  Nephrology was consulted.  Review of Systems:  1. General: No fevers, no chills, no weight gain or weight loss 2. ENT: No runny nose or sore throat, no hearing disturbances 3. Pulmonary: Positive dyspnea, cough, wheezing, or hemoptysis 4. Cardiovascular: No angina, claudication, positive lower extremity edema, no pnd or orthopnea 5. Gastrointestinal: No nausea or vomiting, no diarrhea or constipation 6. Hematology: No easy bruisability or frequent infections 7. Urology: No dysuria, hematuria or increased urinary frequency 8. Dermatology: No rashes. 9. Neurology: No seizures or paresthesias 10. Musculoskeletal: No joint pain or deformities  Past Medical History:  Diagnosis Date  . CHF (congestive heart failure) ([place])   . Diabetes mellitus without complication ([place])   . Hypertension   . Renal disorder   . Seizure [facility])     Past Surgical History:  Procedure Laterality Date  . AV FISTULA PLACEMENT Left 06/19/2015   Procedure:  LEFT ARTERIOVENOUS (AV) FISTULA CREATION;  Surgeon: Elam Dutch, MD;  Location: [place];  Service: Vascular;  Laterality: Left;  . CAPD REMOVAL N/A 06/22/2015   Procedure: CONTINUOUS AMBULATORY PERITONEAL DIALYSIS  (CAPD) CATHETER REMOVAL;  Surgeon: Ralene Ok, MD;  Location: [place];  Service: General;  Laterality: N/A;  . ESOPHAGOGASTRODUODENOSCOPY N/A 06/21/2015   Procedure: ESOPHAGOGASTRODUODENOSCOPY (EGD);  Surgeon: Laurence Spates, MD;  [facility] END[facility]COPY;  Service: Endoscopy;  Laterality: N/A;  . INSERTION OF DIALYSIS CATHETER Left 06/19/2015   Procedure: INSERTION OF DIALYSIS CATHETER LEFT INTERNAL JUGULAR;  Surgeon: Elam Dutch, MD;  Location: [place];  Service: Vascular;  Laterality: Left;  . peritoneal dialysis catheter placed     around 2015 in [place]     reports that he has quit smoking. His smoking use included cigarettes. He has never used smokeless tobacco. He reports current drug use. Drug: Marijuana. He reports that he does not drink alcohol.  Allergies  Allergen Reactions  . Lisinopril Shortness Of Breath    Pt's family said he had SOB, Chest pressure , and coughing     Family History  Problem Relation Age of Onset  . Sudden Cardiac Death Neg Hx      Prior to Admission medications   Medication Sig Start Date End Date Taking? Authorizing Provider  amLODipine (NORVASC) 10 MG tablet Take 1 tablet (10 mg total) by mouth daily. 07/27/18 07/27/19 Yes [name], [name], PA-C  calcitRIOL (ROCALTROL) 0.25 MCG capsule Take 1 capsule (0.25 mcg total) by mouth Every Tuesday,Thursday,and Saturday with dialysis. Patient taking differently: Take 0.25 mcg by mouth every Monday, Wednesday, and Friday.  06/26/15  Yes Hosie Poisson, MD  calcium acetate (PHOSLO) 667 MG capsule Take 1,334  mg by mouth 3 (three) times daily with meals.   Yes [provider]  carvedilol (COREG) 25 MG tablet Take 25 mg by mouth 2 (two) times daily with a meal.   Yes [provider]  isosorbide-hydrALAZINE (BIDIL) 20-37.5 MG tablet Take 1 tablet by mouth 3 (three) times daily. 07/27/18  Yes [name], [name], PA-C  lanthanum (FOSRENOL) 1000 MG chewable tablet Chew 1 tablet (1,000 mg total) by mouth 3 (three) times daily with meals. 08/18/17  Yes Regalado, Belkys A, MD  levETIRAcetam (KEPPRA) 500 MG tablet Take 500 mg by mouth 2 (two) times daily.   Yes [provider]  lidocaine-prilocaine (EMLA) cream Apply 1 application topically as needed (before dialysis.).   Yes [provider]  multivitamin (RENA-VIT) TABS tablet Take 1 tablet by mouth daily.   Yes [provider]  pravastatin (PRAVACHOL) 20 MG tablet Take 20 mg by mouth every evening.   Yes [provider]    Physical Exam: Vitals:   08/16/18 1230 08/16/18 1245 08/16/18 1300 08/16/18 1355  BP: (!) 208/124 (!) 177/110 (!) 183/104 (!) 198/130  Pulse: 82 81 79 (!) 105  Resp: (!) 24 20 (!) 30 (!) 21  Temp:      TempSrc:      SpO2: 93% 92% 91% 90%  Weight:      Height:        Vitals:   08/16/18 1230 08/16/18 1245 08/16/18 1300 08/16/18 1355  BP: (!) 208/124 (!) 177/110 (!) 183/104 (!) 198/130  Pulse: 82 81 79 (!) 105  Resp: (!) 24 20 (!) 30 (!) 21  Temp:      TempSrc:      SpO2: 93% 92% 91% 90%  Weight:      Height:       General: deconditioned and ill looking appearing  Neurology: Awake and alert, non focal Head and Neck. Head normocephalic. Neck supple with no adenopathy or thyromegaly.   E ENT: mild pallor, no icterus, oral mucosa moist Cardiovascular: No JVD. S1-S2 present, rhythmic, no gallops, rubs, or murmurs. +++ pitting bilateral lower extremity edema. Pulmonary: positive breath sounds bilaterally, poor air movement, no wheezing, or rhonchi, positive diffuse bilateral rales. Gastrointestinal. Abdomen protuberant with no organomegaly, non tender, no rebound or guarding Skin. No rashes Musculoskeletal: no joint deformities Hemodialysis  access left upper extremity fistula, palpable bruit   Labs on Admission: I have personally reviewed following labs and imaging studies  CBC: Recent Labs  Lab 08/10/18 1721 08/11/18 0225 08/16/18 1156  WBC 4.9 5.2 5.5  HGB 12.1* 11.1* 12.6*  HCT 36.7* 33.4* 39.6  MCV 100.0 95.7 99.5  PLT 188 178 098   Basic Metabolic Panel: Recent Labs  Lab 08/10/18 1721 08/11/18 0225 08/16/18 1156  NA 139 139 140  K 6.2* 5.3* 7.2*  CL 105 102 102  CO2 20* 21* 21*  GLUCOSE 76 134* 75  BUN 77* 75* 77*  CREATININE 14.88* 15.35* 17.20*  CALCIUM 8.6* 8.7* 9.0  PHOS  --  5.7*  --    GFR: Estimated Creatinine Clearance: 5.4 mL/min (A) (by C-G formula based on SCr of 17.2 mg/dL (H)). Liver Function Tests: Recent Labs  Lab 08/11/18 0225  ALBUMIN 3.3*   No results for input(s): LIPASE, AMYLASE in the last 168 hours. No results for input(s): AMMONIA in the last 168 hours. Coagulation Profile: No results for input(s): INR, PROTIME in the last 168 hours. Cardiac Enzymes: No results for input(s): CKTOTAL, CKMB, CKMBINDEX, TROPONINI in the last  168 hours. BNP (last 3 results) No results for input(s): PROBNP in the last 8760 hours. HbA1C: No results for input(s): HGBA1C in the last 72 hours. CBG: Recent Labs  Lab 08/10/18 2333 08/11/18 0018 08/11/18 0713  GLUCAP 89 82 93   Lipid Profile: No results for input(s): CHOL, HDL, LDLCALC, TRIG, CHOLHDL, LDLDIRECT in the last 72 hours. Thyroid Function Tests: No results for input(s): TSH, T4TOTAL, FREET4, T3FREE, THYROIDAB in the last 72 hours. Anemia Panel: No results for input(s): VITAMINB12, FOLATE, FERRITIN, TIBC, IRON, RETICCTPCT in the last 72 hours. Urine analysis: No results found for: COLORURINE, APPEARANCEUR, LABSPEC, PHURINE, GLUCOSEU, HGBUR, BILIRUBINUR, KETONESUR, PROTEINUR, UROBILINOGEN, NITRITE, LEUKOCYTESUR  Radiological Exams on Admission: Dg Chest 2 View  Result Date: 08/16/2018 CLINICAL DATA:  Shortness of breath.  EXAM: CHEST - 2 VIEW COMPARISON:  Radiographs of August 10, 2018. FINDINGS: Stable cardiomegaly. Mild central pulmonary vascular congestion is noted. Atherosclerosis of thoracic aorta is noted. No pneumothorax is noted. Probable mild bibasilar pulmonary edema is noted. Mild right pleural effusion is noted. Bony thorax is unremarkable. IMPRESSION: Stable cardiomegaly with central pulmonary vascular congestion and probable mild bibasilar pulmonary edema. Mild right pleural effusion. Aortic Atherosclerosis (ICD10-I70.0). Electronically Signed   By: Marijo Conception, M.D.   On: 08/16/2018 13:18    EKG: Independently reviewed.  Normal sinus rhythm, left axis deviation, normal intervals, poor R wave progression.  No peak T waves.  Assessment/Plan Active Problems:   Hyperkalemia  48 year old male with end-stage renal disease who has been noncompliant with his outpatient hemodialysis treatments, he presents with worsening symptoms of volume overload for the last 3 days.  Last hemodialysis treatment February 12, five days ago.  On his initial physical examination his blood pressure is 139/88, heart rate 70, respiratory rate 20, oxygen saturation 90% on room air.  He has mild pallor, lungs with diffuse rales bilaterally, heart S1-S2 present rhythmic, abdomen protuberant, +3+ pedal bilaterally.  Sodium 140, potassium 7.2, chloride 102, bicarb 21, glucose 95, BUN 77, creatinine 17.2, white count 5.5, hemoglobin 12.6, hematocrit 39.6, platelets 177.  Chest x-ray with cardiomegaly, increase bilateral interstitial infiltrates with small right pleural effusion.   Patient will be admitted to the hospital with a working diagnosis of volume overload due to end-stage renal disease, complicated by acute pulmonary edema, related to noncompliance with hemodialysis.  1.  Acute volume overload due to end-stage renal disease, complicated by acute pulmonary edema.  Patient will be admitted to the cardiac telemetry unit,  nephrology was consulted and patient will undergo hemodialysis with ultrafiltration today.  Continue oximetry monitoring supplemental oxygen per nasal cannula.  He does have a left upper extremity fistula.  Continue calcium acetate, phosphatemia.  Patient may need daily hemodialysis with ultrafiltration to achieve euvolemia and homeostasis, will follow up with nephrology recommendations.  2.  Hyperkalemia.  Hyperkalemia due to end-stage renal disease, continue telemetry monitoring.  Patient received in the emergency department calcium gluconate along with insulin and dextrose. Plan for dialysis today.  3.  Hypertension.  Continue blood pressure control with amlodipine, BiDil and carvedilol.  4.  History of seizures.  No active seizure, continue Keppra.  5.  Dyslipidemia.  Continue pravastatin  DVT prophylaxis:  Heparin  Code Status: full  Family Communication: no family at the bedside     Disposition Plan: telemetry    Consults called: nephrology Admission status:  Inpatient.    Jess Toney Gerome Apley MD [facility]   08/16/2018, 2:19 PM

## 2018-08-16 NOTE — ED Provider Notes (Signed)
[facility] EMERGENCY DEPARTMENT Provider Note   CSN: [id number] Arrival date & time:        History   Chief Complaint Chief Complaint  Patient presents with  . Shortness of Breath    HPI George [name] is a 48 y.o. male.  Patient with history of end-stage renal disease, CHF, diabetes, high blood pressure, homelessness --presents by EMS with complaint of shortness of breath.  Patient reportedly had oxygen saturation of 85%.  Patient reports that he is last had dialysis on 08/12/2018.  Since that time he has become progressively more shortness of breath and developed bilateral lower extremity swelling.  Patient states that his dialysis clinic is in [place] and he does not currently have transportation to the clinic.  He has medications but has not taken them today.  He denies any vomiting, chest pain, or abdominal pain.  No fever.  He does have an occasional cough.  He has mild nonbloody diarrhea.  The onset of this condition was acute on chronic. The course is constant. Aggravating factors: none. Alleviating factors: none.       Past Medical History:  Diagnosis Date  . CHF (congestive heart failure) ([place])   . Diabetes mellitus without complication ([place])   . Hypertension   . Renal disorder   . Seizure [facility])     Patient Active Problem List   Diagnosis Date Noted  . Hypertensive urgency 08/08/2017  . Elevated troponin 08/08/2017  . Acute pulmonary edema ([place]) 08/08/2017  . Seizure ([place])   . ESRD needing dialysis ([place])   . Type 2 diabetes mellitus with complication ([place]   . Absolute anemia   . Tobacco abuse   . Hemiparesis affecting left side as late effect of stroke ([place])   . Cerebral infarction due to embolism of right middle cerebral artery ([place])   . Cerebral embolism with cerebral infarction 06/22/2015  . Confusion   . ESRD (end stage renal disease) on dialysis ([place])   . Right leg pain   . Gastrointestinal hemorrhage with melena   . Hematemesis  with nausea   . Hematemesis 06/17/2015  . Uremia 06/17/2015  . Hypoxia 06/16/2015  . Acute respiratory failure ([place]) 06/16/2015  . ESRD on peritoneal dialysis ([place]) 06/16/2015  . Diabetes mellitus without complication ([place]) 12/24/9483  . Essential hypertension 06/16/2015  . Pleuritic chest pain 06/16/2015  . Pain in the chest   . Hyperkalemia   . High anion gap metabolic acidosis   . SOB (shortness of breath)     Past Surgical History:  Procedure Laterality Date  . AV FISTULA PLACEMENT Left 06/19/2015   Procedure: LEFT ARTERIOVENOUS (AV) FISTULA CREATION;  Surgeon: Elam Dutch, MD;  Location: [place];  Service: Vascular;  Laterality: Left;  . CAPD REMOVAL N/A 06/22/2015   Procedure: CONTINUOUS AMBULATORY PERITONEAL DIALYSIS  (CAPD) CATHETER REMOVAL;  Surgeon: Ralene Ok, MD;  Location: [place];  Service: General;  Laterality: N/A;  . ESOPHAGOGASTRODUODENOSCOPY N/A 06/21/2015   Procedure: ESOPHAGOGASTRODUODENOSCOPY (EGD);  Surgeon: Laurence Spates, MD;  Location: [facility] ENDOSCOPY;  Service: Endoscopy;  Laterality: N/A;  . INSERTION OF DIALYSIS CATHETER Left 06/19/2015   Procedure: INSERTION OF DIALYSIS CATHETER LEFT INTERNAL JUGULAR;  Surgeon: Elam Dutch, MD;  Location: [place];  Service: Vascular;  Laterality: Left;  . peritoneal dialysis catheter placed     around 2015 in [facility] Medications    Prior to Admission medications   Medication Sig Start Date End  Date Taking? Authorizing Provider  amLODipine (NORVASC) 10 MG tablet Take 1 tablet (10 mg total) by mouth daily. 07/27/18 07/27/19  [name], [name], PA-C  calcitRIOL (ROCALTROL) 0.25 MCG capsule Take 1 capsule (0.25 mcg total) by mouth Every Tuesday,Thursday,and Saturday with dialysis. Patient taking differently: Take 0.25 mcg by mouth every Monday, Wednesday, and Friday.  06/26/15   Hosie Poisson, MD  calcium acetate (PHOSLO) 667 MG capsule Take 1,334 mg by mouth 3 (three) times daily with  meals.    [provider]  carvedilol (COREG) 25 MG tablet Take 25 mg by mouth 2 (two) times daily with a meal.    [provider]  isosorbide-hydrALAZINE (BIDIL) 20-37.5 MG tablet Take 1 tablet by mouth 3 (three) times daily. 07/27/18   [name], [name], PA-C  lanthanum (FOSRENOL) 1000 MG chewable tablet Chew 1 tablet (1,000 mg total) by mouth 3 (three) times daily with meals. 08/18/17   Regalado, Belkys A, MD  levETIRAcetam (KEPPRA) 500 MG tablet Take 500 mg by mouth 2 (two) times daily.    [provider]  lidocaine-prilocaine (EMLA) cream Apply 1 application topically as needed (before dialysis.).    [provider]  multivitamin (RENA-VIT) TABS tablet Take 1 tablet by mouth daily.    [provider]  pravastatin (PRAVACHOL) 20 MG tablet Take 20 mg by mouth every evening.    [provider]    Family History Family History  Problem Relation Age of Onset  . Sudden Cardiac Death Neg Hx     Social History Social History   Tobacco Use  . Smoking status: Former Smoker    Types: Cigarettes  . Smokeless tobacco: Never Used  Substance Use Topics  . Alcohol use: No  . Drug use: Yes    Types: Marijuana     Allergies   Lisinopril   Review of Systems Review of Systems  Constitutional: Negative for fever.  HENT: Negative for rhinorrhea and sore throat.   Eyes: Negative for redness.  Respiratory: Positive for cough and shortness of breath.   Cardiovascular: Positive for leg swelling. Negative for chest pain.  Gastrointestinal: Positive for diarrhea. Negative for abdominal pain, nausea and vomiting.  Genitourinary: Negative for dysuria.  Musculoskeletal: Negative for myalgias.  Skin: Negative for rash.  Neurological: Negative for headaches.     Physical Exam Updated Vital Signs BP (!) 207/111 (BP Location: Left Arm)   Pulse 82   Temp 97.7 F (36.5 C) (Oral)   Resp (!) 22   Ht 5\' 2"  (1.575 m)   Wt 96.5 kg    SpO2 90%   BMI 38.91 kg/m   Physical Exam Vitals signs and nursing note reviewed.  Constitutional:      Appearance: He is well-developed.  HENT:     Head: Normocephalic and atraumatic.  Eyes:     General:        Right eye: No discharge.        Left eye: No discharge.     Conjunctiva/sclera: Conjunctivae normal.  Neck:     Musculoskeletal: Normal range of motion and neck supple.  Cardiovascular:     Rate and Rhythm: Normal rate and regular rhythm.     Heart sounds: Normal heart sounds.  Pulmonary:     Effort: Pulmonary effort is normal.     Breath sounds: Examination of the right-lower field reveals rales. Examination of the left-lower field reveals rales. Rales present. No decreased breath sounds, wheezing or rhonchi.  Abdominal:     Palpations: Abdomen is  soft.     Tenderness: There is no abdominal tenderness.  Musculoskeletal:     Right lower leg: He exhibits no tenderness. Edema (2+) present.     Left lower leg: He exhibits no tenderness. Edema (2+) present.  Skin:    General: Skin is warm and dry.  Neurological:     Mental Status: He is alert.      ED Treatments / Results  Labs (all labs ordered are listed, but only abnormal results are displayed) Labs Reviewed  CBC - Abnormal; Notable for the following components:      Result Value   RBC 3.98 (*)    Hemoglobin 12.6 (*)    All other components within normal limits  BASIC METABOLIC PANEL - Abnormal; Notable for the following components:   Potassium 7.2 (*)    CO2 21 (*)    BUN 77 (*)    Creatinine, Ser 17.20 (*)    GFR calc non Af Amer 3 (*)    GFR calc Af Amer 3 (*)    Anion gap 17 (*)    All other components within normal limits    EKG EKG Interpretation  Date/Time:  Monday August 16 2018 11:44:14 EST Ventricular Rate:  82 PR Interval:    QRS Duration: 117 QT Interval:  419 QTC Calculation: 490 R Axis:   -102 Text Interpretation:  Sinus rhythm Prolonged PR interval Probable left atrial  enlargement Incomplete RBBB and LAFB No significant change since last tracing Confirmed by Gareth Morgan [id number]) on 08/16/2018 12:13:42 PM   Radiology Dg Chest 2 View  Result Date: 08/16/2018 CLINICAL DATA:  Shortness of breath. EXAM: CHEST - 2 VIEW COMPARISON:  Radiographs of August 10, 2018. FINDINGS: Stable cardiomegaly. Mild central pulmonary vascular congestion is noted. Atherosclerosis of thoracic aorta is noted. No pneumothorax is noted. Probable mild bibasilar pulmonary edema is noted. Mild right pleural effusion is noted. Bony thorax is unremarkable. IMPRESSION: Stable cardiomegaly with central pulmonary vascular congestion and probable mild bibasilar pulmonary edema. Mild right pleural effusion. Aortic Atherosclerosis (ICD10-I70.0). Electronically Signed   By: Marijo Conception, M.D.   On: 08/16/2018 13:18    Procedures Procedures (including critical care time)  Medications Ordered in ED Medications  insulin aspart (novoLOG) injection 5 Units (has no administration in time range)    And  dextrose 50 % solution 50 mL (has no administration in time range)  calcium gluconate 1 g/ 50 mL sodium chloride IVPB (has no administration in time range)  amLODipine (NORVASC) tablet 10 mg (10 mg Oral Given 08/16/18 1220)     Initial Impression / Assessment and Plan / ED Course  I have reviewed the triage vital signs and the nursing notes.  Pertinent labs & imaging results that were available during my care of the patient were reviewed by me and considered in my medical decision making (see chart for details).     Patient seen and examined. Work-up initiated.  Patient is not in any distress.  Oxygen saturation between 89 and 91% on room air.  Patient be placed on 2 L nasal cannula for comfort.  EKG reviewed.  No evidence of prolonged QT, arrhythmia, peak T waves, bradycardia.  Vital signs reviewed and are as follows: BP (!) 207/111 (BP Location: Left Arm)   Pulse 82   Temp 97.7 F (36.5  C) (Oral)   Resp (!) 22   Ht 5\' 2"  (1.575 m)   Wt 96.5 kg   SpO2 90%   BMI 38.91 kg/m  2:17 PM Potassium elevated.  Insulin and D50, calcium ordered.  I spoke with Dr. Jonnie Finner.  He will make arrangements to have patient dialyzed emergently.  We discussed patient's current social situation.  Ideally he would be admitted to have this addressed.  I spoke with Dr. Cathlean Sauer of [facility] who will see patient.  Case manager in the emergency department is helping determine the plan to help ensure patient is able to receive dialysis as an outpatient.  EMERGENCY DEPARTMENT  US GUIDANCE EXAM Emergency Ultrasound:  US Guidance for Needle Guidance  INDICATIONS: Difficult vascular access Linear probe used in real-time to visualize location of needle entry through skin.   PERFORMED BY: Myself IMAGES ARCHIVED?: Yes LIMITATIONS: None VIEWS USED: Transverse INTERPRETATION: Needle visualized within vein and Left arm, 18g  CRITICAL CARE Performed by: Carlisle Cater PA-C Total critical care time: 40 minutes Critical care time was exclusive of separately billable procedures and treating other patients. Critical care was necessary to treat or prevent imminent or life-threatening deterioration. Critical care was time spent personally by me on the following activities: development of treatment plan with patient and/or surrogate as well as nursing, discussions with consultants, evaluation of patient's response to treatment, examination of patient, obtaining history from patient or surrogate, ordering and performing treatments and interventions, ordering and review of laboratory studies, ordering and review of radiographic studies, pulse oximetry and re-evaluation of patient's condition.   Final Clinical Impressions(s) / ED Diagnoses   Final diagnoses:  Hyperkalemia  Acute pulmonary edema ([place])   Admit.   ED Discharge Orders    None       Carlisle Cater, Hershal Coria 08/16/18 1419      Gareth Morgan, MD 08/17/18 1044

## 2018-08-17 ENCOUNTER — Other Ambulatory Visit: Payer: Self-pay

## 2018-08-17 DIAGNOSIS — D631 Anemia in chronic kidney disease: Secondary | ICD-10-CM | POA: Diagnosis not present

## 2018-08-17 DIAGNOSIS — E875 Hyperkalemia: Secondary | ICD-10-CM | POA: Diagnosis not present

## 2018-08-17 DIAGNOSIS — Z992 Dependence on renal dialysis: Secondary | ICD-10-CM

## 2018-08-17 DIAGNOSIS — N2581 Secondary hyperparathyroidism of renal origin: Secondary | ICD-10-CM | POA: Diagnosis not present

## 2018-08-17 DIAGNOSIS — R569 Unspecified convulsions: Secondary | ICD-10-CM | POA: Diagnosis not present

## 2018-08-17 DIAGNOSIS — E8770 Fluid overload, unspecified: Secondary | ICD-10-CM | POA: Diagnosis not present

## 2018-08-17 DIAGNOSIS — N186 End stage renal disease: Secondary | ICD-10-CM | POA: Diagnosis not present

## 2018-08-17 DIAGNOSIS — I120 Hypertensive chronic kidney disease with stage 5 chronic kidney disease or end stage renal disease: Secondary | ICD-10-CM | POA: Diagnosis not present

## 2018-08-17 DIAGNOSIS — I10 Essential (primary) hypertension: Secondary | ICD-10-CM | POA: Diagnosis not present

## 2018-08-17 LAB — GLUCOSE, CAPILLARY
GLUCOSE-CAPILLARY: 104 mg/dL — AB (ref 70–99)
Glucose-Capillary: 67 mg/dL — ABNORMAL LOW (ref 70–99)
Glucose-Capillary: 68 mg/dL — ABNORMAL LOW (ref 70–99)
Glucose-Capillary: 84 mg/dL (ref 70–99)
Glucose-Capillary: 86 mg/dL (ref 70–99)

## 2018-08-17 LAB — BASIC METABOLIC PANEL
Anion gap: 13 (ref 5–15)
BUN: 35 mg/dL — ABNORMAL HIGH (ref 6–20)
CHLORIDE: 98 mmol/L (ref 98–111)
CO2: 26 mmol/L (ref 22–32)
Calcium: 8.2 mg/dL — ABNORMAL LOW (ref 8.9–10.3)
Creatinine, Ser: 10.38 mg/dL — ABNORMAL HIGH (ref 0.61–1.24)
GFR calc Af Amer: 6 mL/min — ABNORMAL LOW (ref >60)
GFR calc non Af Amer: 5 mL/min — ABNORMAL LOW (ref >60)
Glucose, Bld: 92 mg/dL (ref 70–99)
Potassium: 4.4 mmol/L (ref 3.5–5.1)
Sodium: 137 mmol/L (ref 135–145)

## 2018-08-17 MED ORDER — CHLORHEXIDINE GLUCONATE CLOTH 2 % EX PADS
6.0000 | MEDICATED_PAD | Freq: Every day | CUTANEOUS | Status: DC
  Administered 2018-08-18: 6 via TOPICAL

## 2018-08-17 NOTE — Progress Notes (Signed)
[facility] Progress Note  Subjective: seen in room, 4.1 L NET UF yest on HD, cramped. No c/o today.   Vitals:   08/17/18 0130 08/17/18 0348 08/17/18 0750 08/17/18 0829  BP: (!) 162/88 (!) 150/74 (!) 159/67 (!) 171/98  Pulse: 74 77 80 81  Resp: 17 18 20 16   Temp:  98.8 F (37.1 C) 99 F (37.2 C) 98.8 F (37.1 C)  TempSrc:  Oral Oral Oral  SpO2: 98% 92% 100% (!) 87%  Weight:  100.3 kg    Height:        Inpatient medications: . amLODipine  10 mg Oral Daily  . calcium acetate  1,334 mg Oral TID WC  . carvedilol  25 mg Oral BID WC  . Chlorhexidine Gluconate Cloth  6 each Topical Q0600  . heparin  5,000 Units Subcutaneous Q8H  . isosorbide-hydrALAZINE  1 tablet Oral TID  . lanthanum  1,000 mg Oral TID WC  . levETIRAcetam  500 mg Oral BID  . multivitamin  1 tablet Oral QHS  . pravastatin  20 mg Oral QPM  . sodium polystyrene  30 g Oral Once  . sodium zirconium cyclosilicate  10 g Oral Once    acetaminophen **OR** acetaminophen, heparin, lidocaine-prilocaine, ondansetron **OR** ondansetron (ZOFRAN) IV  Iron/TIBC/Ferritin/ %Sat    Component Value Date/Time   IRON 145 06/16/2015 1352   TIBC 200 (L) 06/16/2015 1352   FERRITIN 846 (H) 06/16/2015 1352   IRONPCTSAT 72 (H) 06/16/2015 1352    Exam: Gen alert, no distress, up in bed No jvd Chest cta bilat RRR no MRG Abd soft ntnd no mass or ascites +bs Ext 1+ edema LE's Neuro is alert, Ox 3 , nf  LUA AVF    Home meds:  - amlodipine 10/ carvedilol 25 bid/ isosorbide-hydralazine 20-37.5 bid  - pravastatin 20/ calc acetate 2 ac tid/ lanthanum 1gm ac  - levetiracetam 500 bid   Dialysis: [facility] MWF   4.5h    94.5kg  2/2.5 bath  14ga  400/800  Hep none  LUA AVF     Assessment/ Plan: 1. SOB/ pulm edema: SOB resolved. Still vol up 6kg. HD tomorrow.  2. Homeless: Per HD staff at [place] he has a transportation service arranged which will pick him up anywhere, he just needs to contact them  ahead of time with a place that has an address.  Have d/w patient today.  3. Hyperkalemia - resolved w/ HD 4. ESRD on HD MWF 5. HTN 6. Seizure d/o 7. Anemia ckd - Hb 12 no need for esa 8. MBD ckd - cont meds      [facility] 08/17/2018, 9:41 AM  Recent Labs  Lab 08/11/18 0225 08/16/18 1156 08/17/18 0442  NA 139 140 137  K 5.3* 7.2* 4.4  CL 102 102 98  CO2 21* 21* 26  GLUCOSE 134* 75 92  BUN 75* 77* 35*  CREATININE 15.35* 17.20* 10.38*  CALCIUM 8.7* 9.0 8.2*  PHOS 5.7*  --   --   ALBUMIN 3.3*  --   --    No results for input(s): AST, ALT, ALKPHOS, BILITOT, PROT in the last 168 hours. Recent Labs  Lab 08/11/18 0225 08/16/18 1156  WBC 5.2 5.5  HGB 11.1* 12.6*  HCT 33.4* 39.6  MCV 95.7 99.5  PLT 178 177

## 2018-08-17 NOTE — Progress Notes (Addendum)
PROGRESS NOTE    George [name]  RDE:[id number] DOB: October 26, 1970 DOA: 08/16/2018 PCP: Shelby Mattocks, PA-C    Brief Narrative:  48 year old male with end-stage renal disease who has been noncompliant with his outpatient hemodialysis treatments, he presents with worsening symptoms of volume overload for the last 3 days.  Last hemodialysis treatment February 12, five days ago.  On his initial physical examination his blood pressure is 139/88, heart rate 70, respiratory rate 20, oxygen saturation 90% on room air.  He has mild pallor, lungs with diffuse rales bilaterally, heart S1-S2 present rhythmic, abdomen protuberant, +3+ pedal bilaterally.  Sodium 140, potassium 7.2, chloride 102, bicarb 21, glucose 95, BUN 77, creatinine 17.2, white count 5.5, hemoglobin 12.6, hematocrit 39.6, platelets 177.  Chest x-ray with cardiomegaly, increase bilateral interstitial infiltrates with small right pleural effusion.   Patient will be admitted to the hospital with a working diagnosis of volume overload due to end-stage renal disease, complicated by acute pulmonary edema, related to noncompliance with hemodialysis.   Assessment & Plan:   Active Problems:   Hyperkalemia   1.  Acute volume overload due to end-stage renal disease, complicated by acute pulmonary edema.  Patient underwent HD with ultrafiltration, with improvement of his volume status, but not back to baseline yet. K is down to 4,4 and serum bicarbonate is 26. Will continue to follow with nephrology recommendations. Will discontinue telemetry monitoring, Oxygen saturation 87% on room air today, will continue supplemental 02 per [place] and ultrafiltration per HD.   2.  Hyperkalemia. serum K is down to 4,4 with serum bicarbonate at 26, will follow on renal panel in am. Follow with nephrology recommendations. Patient did received one dose of sodium zirconium and kayexalate.     3.  Hypertension.  On amlodipine, BiDil and carvedilol for blood  pressure control, systolic 631 mmHg.   4.  History of seizures.  On Keppra, with good toleration.  5.  Dyslipidemia.  On pravastatin.   6. Depression. Patient with flat affect, expressed having suicidal thoughts recently as outpatient, will consult psych for an evaluation.   DVT prophylaxis: heparin  Code Status: full Family Communication: no family at the bedside Disposition Plan/ discharge barriers:  Pending clinical improvement, patient gets HD in [place] and transportation is arranged.   Body mass index is 40.44 kg/m. Malnutrition Type:      Malnutrition Characteristics:      Nutrition Interventions:     RN Pressure Injury Documentation:     Consultants:   Nephrology   Procedures:     Antimicrobials:       Subjective: Patient is feeling better after HD, but still not back to baseline, continue to have edema and dyspnea on exertion, no nausea or vomiting, no chest pain. Patient expressed suicidal thought in the recent past, but not today.   Objective: Vitals:   08/17/18 0130 08/17/18 0348 08/17/18 0750 08/17/18 0829  BP: (!) 162/88 (!) 150/74 (!) 159/67 (!) 171/98  Pulse: 74 77 80 81  Resp: 17 18 20 16   Temp:  98.8 F (37.1 C) 99 F (37.2 C) 98.8 F (37.1 C)  TempSrc:  Oral Oral Oral  SpO2: 98% 92% 100% (!) 87%  Weight:  100.3 kg    Height:        Intake/Output Summary (Last 24 hours) at 08/17/2018 1029 Last data filed at 08/17/2018 0058 Gross per 24 hour  Intake 240 ml  Output 4155 ml  Net -3915 ml   Filed Weights   08/16/18 1928 08/17/18  0014 08/17/18 0348  Weight: 105.9 kg 105.7 kg 100.3 kg    Examination:   General: deconditioned, and ill looking appearing  Neurology: Awake and alert, non focal  E ENT: mild  pallor, no icterus, oral mucosa moist Cardiovascular: No JVD. S1-S2 present, rhythmic, no gallops, rubs, or murmurs. ++ pitting lower extremity edema. Pulmonary: vesicular breath sounds bilaterally, adequate air  movement, no wheezing, no rhonchi, scattered rales biaterally. Gastrointestinal. Abdomen protuberant with no organomegaly, non tender, no rebound or guarding Skin. No rashes Musculoskeletal: no joint deformities     Data Reviewed: I have personally reviewed following labs and imaging studies  CBC: Recent Labs  Lab 08/10/18 1721 08/11/18 0225 08/16/18 1156  WBC 4.9 5.2 5.5  HGB 12.1* 11.1* 12.6*  HCT 36.7* 33.4* 39.6  MCV 100.0 95.7 99.5  PLT 188 178 562   Basic Metabolic Panel: Recent Labs  Lab 08/10/18 1721 08/11/18 0225 08/16/18 1156 08/17/18 0442  NA 139 139 140 137  K 6.2* 5.3* 7.2* 4.4  CL 105 102 102 98  CO2 20* 21* 21* 26  GLUCOSE 76 134* 75 92  BUN 77* 75* 77* 35*  CREATININE 14.88* 15.35* 17.20* 10.38*  CALCIUM 8.6* 8.7* 9.0 8.2*  PHOS  --  5.7*  --   --    GFR: Estimated Creatinine Clearance: 9.2 mL/min (A) (by C-G formula based on SCr of 10.38 mg/dL (H)). Liver Function Tests: Recent Labs  Lab 08/11/18 0225  ALBUMIN 3.3*   No results for input(s): LIPASE, AMYLASE in the last 168 hours. No results for input(s): AMMONIA in the last 168 hours. Coagulation Profile: No results for input(s): INR, PROTIME in the last 168 hours. Cardiac Enzymes: No results for input(s): CKTOTAL, CKMB, CKMBINDEX, TROPONINI in the last 168 hours. BNP (last 3 results) No results for input(s): PROBNP in the last 8760 hours. HbA1C: No results for input(s): HGBA1C in the last 72 hours. CBG: Recent Labs  Lab 08/16/18 1658 08/16/18 1924 08/17/18 0041 08/17/18 0124 08/17/18 0825  GLUCAP 68* 129* 68* 104* 67*   Lipid Profile: No results for input(s): CHOL, HDL, LDLCALC, TRIG, CHOLHDL, LDLDIRECT in the last 72 hours. Thyroid Function Tests: No results for input(s): TSH, T4TOTAL, FREET4, T3FREE, THYROIDAB in the last 72 hours. Anemia Panel: No results for input(s): VITAMINB12, FOLATE, FERRITIN, TIBC, IRON, RETICCTPCT in the last 72 hours.    Radiology Studies: I  have reviewed all of the imaging during this hospital visit personally     Scheduled Meds: . amLODipine  10 mg Oral Daily  . calcium acetate  1,334 mg Oral TID WC  . carvedilol  25 mg Oral BID WC  . Chlorhexidine Gluconate Cloth  6 each Topical Q0600  . Chlorhexidine Gluconate Cloth  6 each Topical Q0600  . heparin  5,000 Units Subcutaneous Q8H  . isosorbide-hydrALAZINE  1 tablet Oral TID  . lanthanum  1,000 mg Oral TID WC  . levETIRAcetam  500 mg Oral BID  . multivitamin  1 tablet Oral QHS  . pravastatin  20 mg Oral QPM  . sodium polystyrene  30 g Oral Once  . sodium zirconium cyclosilicate  10 g Oral Once   Continuous Infusions:   LOS: 1 day        George [name] Gerome Apley, MD

## 2018-08-17 NOTE — Progress Notes (Signed)
HD tx completed @ 2345 w/o problem other than cramping, pt states he can't tolerate goal >4500 ml,  UF goal not met Blood rinsed back VSS w/ high bp Report called to Dorthula Nettles, RN

## 2018-08-17 NOTE — Consult Note (Signed)
   [facility] CM Inpatient Consult   08/17/2018  George [name] 1971-01-17 [id number]   Follow up:  This patient is being followed by embedded practice [[facility]] for disposition and needs. Patient is not a [facility] beneficiary however, he will have his care needs for community followed by [facility] staff in embedded practice at [place] with primary care provider being [name], [name], [place].  Please see community follow up notes for details.  Natividad Brood, RN BSN [facility] Liaison  [phone] business mobile phone Toll free office [phone]

## 2018-08-17 NOTE — Plan of Care (Signed)
?  Problem: Activity: ?Goal: Capacity to carry out activities will improve ?Outcome: Progressing ?  ?

## 2018-08-17 NOTE — Progress Notes (Signed)
Pt back from HD, BP elevated, scheduled meds given. Will recheck vital signs in an hour.

## 2018-08-17 NOTE — Progress Notes (Signed)
Pts. CBG 67, pt about to eat breakfast.will recheck CBG.

## 2018-08-17 NOTE — Progress Notes (Signed)
PROGRESS NOTE  George [name] ZHY:[id number] DOB: [birth year] DOA: 08/16/2018 PCP: George Mattocks, PA-C  HPI/Brief Narrative  George [name] is a 48 y.o. year old male with medical history significant for ESRD on hemodialysis, Hyperkalemia who presented on 08/16/2018 with SOB, HA, pedal edema and was found to have missed his last dialysis appointment. Following receiving dialysis in the hospital, his symptoms improved, though he now reports bilateral LE cramping. The cramping is improved by standing up, but exacerbated by pressing on his shins. He has experienced this before after too much fluid has been taken off, per patient. This patient was last discharged from the hospital 08/11/2018, day of his last hemodialysis, after being admitted from hyperkalemia related to noncompliance with hemodialysis. His dialysis schedule is Mon/Wed/Friday.  Patient also reports history of suicidal ideation, though he denies experiencing this today. He does endorse experiencing this within the last few days. He does not have a plan to commit suicide, and he is motivated to stay alive for his children, who live in [place]. He has never tried to hurt himself before. He has never taken medication for depression.    Assessment/Plan:  Hyperkalemia Hyperkalemia d/t ESRD. Following inpatient dialysis, potassium now in normal range. Strongly encouraged patient to follow up with his regularly scheduled dialysis appointments as directed.   History of suicidal ideation Patient is low risk given no current SI, no plan to commit suicide, and presence of motivating factors to stay alive. Rec outpatient psychiatry consult.   ESRD Continue calcium acetate, phosphatemia, regular dialysis as directed.  Hypertension Continue amlodipine, BiDil, and carvedilol as directed.   DVT prophylaxis:  Heparin  Code Status: full  Family Communication: no family at the bedside     Disposition Plan: telemetry- ceased at noon  08/17/2018 Consults called: psychiatry Admission status:  Inpatient.   Subjective Pt is overall feeling better following dialysis. Does note some LE cramping that is new. Reports past SI but no current SI, no plan to harm himself, is motivated to stay alive for his children.   Objective: Vitals:   08/17/18 0348 08/17/18 0750 08/17/18 0829 08/17/18 1236  BP: (!) 150/74 (!) 159/67 (!) 171/98 (!) 151/69  Pulse: 77 80 81 84  Resp: 18 20 16  (!) 22  Temp: 98.8 F (37.1 C) 99 F (37.2 C) 98.8 F (37.1 C) 98.9 F (37.2 C)  TempSrc: Oral Oral Oral Oral  SpO2: 92% 100% (!) 87% (!) 87%  Weight: 100.3 kg     Height:        Intake/Output Summary (Last 24 hours) at 08/17/2018 1441 Last data filed at 08/17/2018 1244 Gross per 24 hour  Intake 480 ml  Output 4155 ml  Net -3675 ml   Filed Weights   08/16/18 1928 08/17/18 0014 08/17/18 0348  Weight: 105.9 kg 105.7 kg 100.3 kg    Exam:  Constitutional: deconditioned and disheveled appearing.   HEENT: Normocephalic, atraumatic, oropharynx with moist mucous membranes Cardiovascular: RRR no MRGs, with no peripheral edema Respiratory: Normal respiratory effort, clear breath sounds  Skin: No rash ulcers, or lesions. Without skin tenting. Dry skin bilateral LEs. Neurologic: Grossly no focal neuro deficit. Psychiatric:Mental status AAOx3  Data Reviewed: CBC: Recent Labs  Lab 08/10/18 1721 08/11/18 0225 08/16/18 1156  WBC 4.9 5.2 5.5  HGB 12.1* 11.1* 12.6*  HCT 36.7* 33.4* 39.6  MCV 100.0 95.7 99.5  PLT 188 178 295   Basic Metabolic Panel: Recent Labs  Lab 08/10/18 1721 08/11/18 0225 08/16/18 1156 08/17/18 0442  NA  139 139 140 137  K 6.2* 5.3* 7.2* 4.4  CL 105 102 102 98  CO2 20* 21* 21* 26  GLUCOSE 76 134* 75 92  BUN 77* 75* 77* 35*  CREATININE 14.88* 15.35* 17.20* 10.38*  CALCIUM 8.6* 8.7* 9.0 8.2*  PHOS  --  5.7*  --   --    GFR: Estimated Creatinine Clearance: 9.2 mL/min (A) (by C-G formula based on SCr of 10.38  mg/dL (H)). Liver Function Tests: Recent Labs  Lab 08/11/18 0225  ALBUMIN 3.3*   No results for input(s): LIPASE, AMYLASE in the last 168 hours. No results for input(s): AMMONIA in the last 168 hours. Coagulation Profile: No results for input(s): INR, PROTIME in the last 168 hours. Cardiac Enzymes: No results for input(s): CKTOTAL, CKMB, CKMBINDEX, TROPONINI in the last 168 hours. BNP (last 3 results) No results for input(s): PROBNP in the last 8760 hours. HbA1C: No results for input(s): HGBA1C in the last 72 hours. CBG: Recent Labs  Lab 08/16/18 1924 08/17/18 0041 08/17/18 0124 08/17/18 0825 08/17/18 1235  GLUCAP 129* 68* 104* 67* 84   Lipid Profile: No results for input(s): CHOL, HDL, LDLCALC, TRIG, CHOLHDL, LDLDIRECT in the last 72 hours. Thyroid Function Tests: No results for input(s): TSH, T4TOTAL, FREET4, T3FREE, THYROIDAB in the last 72 hours. Anemia Panel: No results for input(s): VITAMINB12, FOLATE, FERRITIN, TIBC, IRON, RETICCTPCT in the last 72 hours. Urine analysis: No results found for: COLORURINE, APPEARANCEUR, LABSPEC, PHURINE, GLUCOSEU, HGBUR, BILIRUBINUR, KETONESUR, PROTEINUR, UROBILINOGEN, NITRITE, LEUKOCYTESUR Sepsis Labs: @LABRCNTIP (procalcitonin:4,lacticidven:4)  ) Recent Results (from the past 240 hour(s))  MRSA PCR Screening     Status: None   Collection Time: 08/11/18  1:42 AM  Result Value Ref Range Status   MRSA by PCR NEGATIVE NEGATIVE Final    Comment:        The GeneXpert MRSA Assay (FDA approved for NASAL specimens only), is one component of a comprehensive MRSA colonization surveillance program. It is not intended to diagnose MRSA infection nor to guide or monitor treatment for MRSA infections. Performed at [facility], [place] [address] [place], [place] [address]       Studies: No results found.  Scheduled Meds: . amLODipine  10 mg Oral Daily  . calcium acetate  1,334 mg Oral TID WC  . carvedilol  25 mg Oral BID WC    . Chlorhexidine Gluconate Cloth  6 each Topical Q0600  . Chlorhexidine Gluconate Cloth  6 each Topical Q0600  . heparin  5,000 Units Subcutaneous Q8H  . isosorbide-hydrALAZINE  1 tablet Oral TID  . lanthanum  1,000 mg Oral TID WC  . levETIRAcetam  500 mg Oral BID  . multivitamin  1 tablet Oral QHS  . pravastatin  20 mg Oral QPM  . sodium polystyrene  30 g Oral Once  . sodium zirconium cyclosilicate  10 g Oral Once     LOS: 1 day     Hazel Sams, PA-S 08/17/18

## 2018-08-18 ENCOUNTER — Telehealth: Payer: Self-pay

## 2018-08-18 DIAGNOSIS — E875 Hyperkalemia: Secondary | ICD-10-CM | POA: Diagnosis not present

## 2018-08-18 DIAGNOSIS — F321 Major depressive disorder, single episode, moderate: Secondary | ICD-10-CM

## 2018-08-18 DIAGNOSIS — Z992 Dependence on renal dialysis: Secondary | ICD-10-CM | POA: Diagnosis not present

## 2018-08-18 DIAGNOSIS — D631 Anemia in chronic kidney disease: Secondary | ICD-10-CM | POA: Diagnosis not present

## 2018-08-18 DIAGNOSIS — I120 Hypertensive chronic kidney disease with stage 5 chronic kidney disease or end stage renal disease: Secondary | ICD-10-CM | POA: Diagnosis not present

## 2018-08-18 DIAGNOSIS — E8770 Fluid overload, unspecified: Secondary | ICD-10-CM | POA: Diagnosis not present

## 2018-08-18 DIAGNOSIS — N186 End stage renal disease: Secondary | ICD-10-CM | POA: Diagnosis not present

## 2018-08-18 DIAGNOSIS — N2581 Secondary hyperparathyroidism of renal origin: Secondary | ICD-10-CM | POA: Diagnosis not present

## 2018-08-18 DIAGNOSIS — J810 Acute pulmonary edema: Secondary | ICD-10-CM | POA: Diagnosis not present

## 2018-08-18 LAB — BASIC METABOLIC PANEL
Anion gap: 14 (ref 5–15)
BUN: 46 mg/dL — ABNORMAL HIGH (ref 6–20)
CO2: 24 mmol/L (ref 22–32)
Calcium: 8.3 mg/dL — ABNORMAL LOW (ref 8.9–10.3)
Chloride: 97 mmol/L — ABNORMAL LOW (ref 98–111)
Creatinine, Ser: 12.12 mg/dL — ABNORMAL HIGH (ref 0.61–1.24)
GFR calc Af Amer: 5 mL/min — ABNORMAL LOW (ref >60)
GFR calc non Af Amer: 4 mL/min — ABNORMAL LOW (ref >60)
Glucose, Bld: 81 mg/dL (ref 70–99)
Potassium: 5.8 mmol/L — ABNORMAL HIGH (ref 3.5–5.1)
Sodium: 135 mmol/L (ref 135–145)

## 2018-08-18 LAB — GLUCOSE, CAPILLARY
Glucose-Capillary: 78 mg/dL (ref 70–99)
Glucose-Capillary: 111 mg/dL — ABNORMAL HIGH (ref 70–99)
Glucose-Capillary: 95 mg/dL (ref 70–99)

## 2018-08-18 LAB — HEPATITIS B SURFACE ANTIGEN: Hepatitis B Surface Ag: NEGATIVE

## 2018-08-18 MED ORDER — HEPARIN SODIUM (PORCINE) 1000 UNIT/ML IJ SOLN
INTRAMUSCULAR | Status: AC
  Filled 2018-08-18: qty 8

## 2018-08-18 NOTE — Progress Notes (Signed)
PROGRESS NOTE    George [name]  PIR:[id number] DOB: Feb 25, 1971 DOA: 08/16/2018 PCP: Shelby Mattocks, PA-C   Brief Narrative:  48 year old with past medical history relevant for ESRD on dialysis on M/W/F, noncompliant, hypertension, seizure disorder, hyperlipidemia who presents with acute hypoxic respiratory failure and hyperkalemia due to missing dialysis for social reasons.   Assessment & Plan:   Active Problems:   Hypoxia   Acute respiratory failure (HCC)   ESRD on peritoneal dialysis [facility])   Essential hypertension   Hyperkalemia   Acute pulmonary edema (HCC)   Seizure ([place])   #) Acute hypoxic respiratory failure/hyperkalemia/ESRD: Patient is missed dialysis due to problems with transportation issues. -Patient is getting dialysis yesterday and today, still hyperkalemic -Nephrology consulted, appreciate recommendations -Continue PhosLo with meals -Continue calcitriol Monday Tuesday Wednesday with dialysis  #) Hypertension/hyperlipidemia: -Continue budipine 10 mg daily -Continue carvedilol 25 mg twice daily -Continue isosorbide 20 mg 3 times daily -Continue hydralazine 37.5 mg 3 times daily - Continue pravastatin 20 mg nightly  #) Seizure disorder: -Continue levetiracetam 500 mg twice daily  Fluids: Restrict Electrolytes: Monitor and supplement Nutrition: Renal diet   Prophylaxis: Subcu heparin   Disposition: Pending normalization of electrolytes  Full code   Consultants:   Nephrology  Procedures:   None  Antimicrobials:   None none   Subjective: This morning the patient does not have any complaints.  He denies any nausea, vomiting, diarrhea, cough, congestion, rhinorrhea.  He is currently getting dialysis.  Objective: Vitals:   08/18/18 1000 08/18/18 1030 08/18/18 1100 08/18/18 1130  BP: (!) 155/92 (!) 153/97 (!) 165/94 (!) 147/89  Pulse: 75 75 77 78  Resp:    18  Temp:    97.7 F (36.5 C)  TempSrc:    Oral  SpO2:      Weight:        Height:        Intake/Output Summary (Last 24 hours) at 08/18/2018 1203 Last data filed at 08/18/2018 1130 Gross per 24 hour  Intake 480 ml  Output 4000 ml  Net -3520 ml   Filed Weights   08/17/18 0348 08/18/18 0617 08/18/18 0646  Weight: 100.3 kg 100.9 kg 101.3 kg    Examination:  General exam: Appears calm and comfortable  Respiratory system: No increased work of breathing anterior lung sounds clear to auscultation bilaterally Cardiovascular system: Regular rate and rhythm, 2 out of 6 systolic murmur that is transmitted from fistula Gastrointestinal system: Soft, nondistended, no rebound or guarding, plus bowel sounds Central nervous system: Alert and oriented.  Grossly intact, moving all extremities Extremities: No lower extremity edema, left upper extremity fistula with palpable thrill and audible bruit. Skin: No rashes over visible skin Psychiatry: Judgement and insight appear normal. Mood & affect appropriate.     Data Reviewed: I have personally reviewed following labs and imaging studies  CBC: Recent Labs  Lab 08/16/18 1156  WBC 5.5  HGB 12.6*  HCT 39.6  MCV 99.5  PLT 630   Basic Metabolic Panel: Recent Labs  Lab 08/16/18 1156 08/17/18 0442 08/18/18 0354  NA 140 137 135  K 7.2* 4.4 5.8*  CL 102 98 97*  CO2 21* 26 24  GLUCOSE 75 92 81  BUN 77* 35* 46*  CREATININE 17.20* 10.38* 12.12*  CALCIUM 9.0 8.2* 8.3*   GFR: Estimated Creatinine Clearance: 7.9 mL/min (A) (by C-G formula based on SCr of 12.12 mg/dL (H)). Liver Function Tests: No results for input(s): AST, ALT, ALKPHOS, BILITOT, PROT, ALBUMIN in the last  168 hours. No results for input(s): LIPASE, AMYLASE in the last 168 hours. No results for input(s): AMMONIA in the last 168 hours. Coagulation Profile: No results for input(s): INR, PROTIME in the last 168 hours. Cardiac Enzymes: No results for input(s): CKTOTAL, CKMB, CKMBINDEX, TROPONINI in the last 168 hours. BNP (last 3 results) No  results for input(s): PROBNP in the last 8760 hours. HbA1C: No results for input(s): HGBA1C in the last 72 hours. CBG: Recent Labs  Lab 08/17/18 0041 08/17/18 0124 08/17/18 0825 08/17/18 1235 08/17/18 2137  GLUCAP 68* 104* 67* 84 86   Lipid Profile: No results for input(s): CHOL, HDL, LDLCALC, TRIG, CHOLHDL, LDLDIRECT in the last 72 hours. Thyroid Function Tests: No results for input(s): TSH, T4TOTAL, FREET4, T3FREE, THYROIDAB in the last 72 hours. Anemia Panel: No results for input(s): VITAMINB12, FOLATE, FERRITIN, TIBC, IRON, RETICCTPCT in the last 72 hours. Sepsis Labs: No results for input(s): PROCALCITON, LATICACIDVEN in the last 168 hours.  Recent Results (from the past 240 hour(s))  MRSA PCR Screening     Status: None   Collection Time: 08/11/18  1:42 AM  Result Value Ref Range Status   MRSA by PCR NEGATIVE NEGATIVE Final    Comment:        The GeneXpert MRSA Assay (FDA approved for NASAL specimens only), is one component of a comprehensive MRSA colonization surveillance program. It is not intended to diagnose MRSA infection nor to guide or monitor treatment for MRSA infections. Performed at [facility], [place] [address] [place], [place] [address]          Radiology Studies: Dg Chest 2 View  Result Date: 08/16/2018 CLINICAL DATA:  Shortness of breath. EXAM: CHEST - 2 VIEW COMPARISON:  Radiographs of August 10, 2018. FINDINGS: Stable cardiomegaly. Mild central pulmonary vascular congestion is noted. Atherosclerosis of thoracic aorta is noted. No pneumothorax is noted. Probable mild bibasilar pulmonary edema is noted. Mild right pleural effusion is noted. Bony thorax is unremarkable. IMPRESSION: Stable cardiomegaly with central pulmonary vascular congestion and probable mild bibasilar pulmonary edema. Mild right pleural effusion. Aortic Atherosclerosis (ICD10-I70.0). Electronically Signed   By: Marijo Conception, M.D.   On: 08/16/2018 13:18         Scheduled Meds: . amLODipine  10 mg Oral Daily  . calcium acetate  1,334 mg Oral TID WC  . carvedilol  25 mg Oral BID WC  . Chlorhexidine Gluconate Cloth  6 each Topical Q0600  . Chlorhexidine Gluconate Cloth  6 each Topical Q0600  . heparin  5,000 Units Subcutaneous Q8H  . isosorbide-hydrALAZINE  1 tablet Oral TID  . lanthanum  1,000 mg Oral TID WC  . levETIRAcetam  500 mg Oral BID  . multivitamin  1 tablet Oral QHS  . pravastatin  20 mg Oral QPM  . sodium polystyrene  30 g Oral Once  . sodium zirconium cyclosilicate  10 g Oral Once   Continuous Infusions:   LOS: 2 days    Time spent: [place], MD [facility]   If 7PM-7AM, please contact night-coverage [email or web address] Password [facility] 08/18/2018, 12:03 PM

## 2018-08-18 NOTE — Plan of Care (Signed)
?  Problem: Activity: ?Goal: Capacity to carry out activities will improve ?Outcome: Progressing ?  ?

## 2018-08-18 NOTE — Consult Note (Signed)
[facility] Consult   Reason for Consult:  Depression  Referring Physician:  Dr. Herbert Moors  Patient Identification: George [name] MRN:  [id number] Principal Diagnosis: MDD (major depressive disorder), single episode, moderate ([place]) Diagnosis:  Active Problems:   Hypoxia   Acute respiratory failure ([place])   ESRD on peritoneal dialysis [facility])   Essential hypertension   Hyperkalemia   Acute pulmonary edema ([place])   Seizure ([place])   Total Time spent with patient: 1 hour  Subjective:   George [name] is a 48 y.o. male patient admitted with hyperkalemia.  HPI:   Per chart review, patient was admitted with hyperkalemia. He has ERSD requiring dialysis. He reportedly missed a dialysis session. He reports feeling depressed. He denies SI but does report recent SI without a plan. He is future oriented and his children in [place] are protective factors.  On interview, George [name] reports that he is "tired." He received dialysis today. He reports feeling depressed since 2016 after his medical problems worsened. He moved from [place] to [place] to be closer to his family during this time. He reports that they asked him to move closer so that they could help him. He reports that he is homeless and denies support from his family. He denies SI, HI or AVH. He reports poor sleep with problems maintaining sleep. He reports fluctuations in his appetite and weight. He denies a history of manic symptoms (decreased need for sleep, increased energy, pressured speech or euphoria). He has never seen a mental health provider or taken medications for depression.    Past Psychiatric History: Denies   Risk to Self:  None. Denies SI.  Risk to Others:  None. Denies HI.  Prior Inpatient Therapy:  Denies  Prior Outpatient Therapy:  Denies   Past Medical History:  Past Medical History:  Diagnosis Date  . CHF (congestive heart failure) ([place])   . Diabetes mellitus without complication ([place])   .  Hypertension   . Renal disorder   . Seizure [facility])     Past Surgical History:  Procedure Laterality Date  . AV FISTULA PLACEMENT Left 06/19/2015   Procedure: LEFT ARTERIOVENOUS (AV) FISTULA CREATION;  Surgeon: Elam Dutch, MD;  Location: [place];  Service: Vascular;  Laterality: Left;  . CAPD REMOVAL N/A 06/22/2015   Procedure: CONTINUOUS AMBULATORY PERITONEAL DIALYSIS  (CAPD) CATHETER REMOVAL;  Surgeon: Ralene Ok, MD;  Location: [place];  Service: General;  Laterality: N/A;  . ESOPHAGOGASTRODUODENOSCOPY N/A 06/21/2015   Procedure: ESOPHAGOGASTRODUODENOSCOPY (EGD);  Surgeon: Laurence Spates, MD;  Location: [facility] ENDOSCOPY;  Service: Endoscopy;  Laterality: N/A;  . INSERTION OF DIALYSIS CATHETER Left 06/19/2015   Procedure: INSERTION OF DIALYSIS CATHETER LEFT INTERNAL JUGULAR;  Surgeon: Elam Dutch, MD;  Location: [place];  Service: Vascular;  Laterality: Left;  . peritoneal dialysis catheter placed     around 2015 in [place]   Family History:  Family History  Problem Relation Age of Onset  . Sudden Cardiac Death Neg Hx    Family Psychiatric  History: Denies  Social History:  Social History   Substance and Sexual Activity  Alcohol Use No     Social History   Substance and Sexual Activity  Drug Use Yes  . Types: Marijuana    Social History   Socioeconomic History  . Marital status: Single    Spouse name: Not on file  . Number of children: Not on file  . Years of education: Not on file  . Highest education level: Not on  file  Occupational History  . Not on file  Social Needs  . Financial resource strain: Very hard  . Food insecurity:    Worry: Often true    Inability: Often true  . Transportation needs:    Medical: Yes    Non-medical: Yes  Tobacco Use  . Smoking status: Former Smoker    Types: Cigarettes  . Smokeless tobacco: Never Used  Substance and Sexual Activity  . Alcohol use: No  . Drug use: Yes    Types: Marijuana  . Sexual activity: Not on file   Lifestyle  . Physical activity:    Days per week: Not on file    Minutes per session: Not on file  . Stress: Not on file  Relationships  . Social connections:    Talks on phone: Not on file    Gets together: Not on file    Attends religious service: Not on file    Active member of club or organization: Not on file    Attends meetings of clubs or organizations: Not on file    Relationship status: Not on file  Other Topics Concern  . Not on file  Social History Narrative  . Not on file   Additional Social History: He is homeless. He is from [place]. He is divorced x 1. He has 12 children from 28-26 y/o. He previously worked as a Training and development officer in 2016. He denies alcohol or illicit substance use.     Allergies:   Allergies  Allergen Reactions  . Lisinopril Shortness Of Breath    Pt's family said he had SOB, Chest pressure , and coughing     Labs:  Results for orders placed or performed during the hospital encounter of 08/16/18 (from the past 48 hour(s))  CBC     Status: Abnormal   Collection Time: 08/16/18 11:56 AM  Result Value Ref Range   WBC 5.5 4.0 - 10.5 K/uL   RBC 3.98 (L) 4.22 - 5.81 MIL/uL   Hemoglobin 12.6 (L) 13.0 - 17.0 g/dL   HCT 39.6 39.0 - 52.0 %   MCV 99.5 80.0 - 100.0 fL   MCH 31.7 26.0 - 34.0 pg   MCHC 31.8 30.0 - 36.0 g/dL   RDW 15.5 11.5 - 15.5 %   Platelets 177 150 - 400 K/uL   nRBC 0.0 0.0 - 0.2 %    Comment: Performed at [facility], [place] [address] [place], [place] [address]  Basic metabolic panel     Status: Abnormal   Collection Time: 08/16/18 11:56 AM  Result Value Ref Range   Sodium 140 135 - 145 mmol/L   Potassium 7.2 (HH) 3.5 - 5.1 mmol/L    Comment: NO VISIBLE HEMOLYSIS CRITICAL RESULT CALLED TO, READ BACK BY AND VERIFIED WITH: Ivin Poot RN [phone] BY A BENNETT    Chloride 102 98 - 111 mmol/L   CO2 21 (L) 22 - 32 mmol/L   Glucose, Bld 75 70 - 99 mg/dL   BUN 77 (H) 6 - 20 mg/dL   Creatinine, Ser 17.20 (H) 0.61 - 1.24 mg/dL    Calcium 9.0 8.9 - 10.3 mg/dL   GFR calc non Af Amer 3 (L) >60 mL/min   GFR calc Af Amer 3 (L) >60 mL/min   Anion gap 17 (H) 5 - 15    Comment: Performed at [facility], [place] [address], [place], [place] [address]  Glucose, capillary     Status: Abnormal   Collection Time:  08/16/18  4:12 PM  Result Value Ref Range   Glucose-Capillary 40 (LL) 70 - 99 mg/dL   Comment 1 Notify RN   Glucose, capillary     Status: Abnormal   Collection Time: 08/16/18  4:30 PM  Result Value Ref Range   Glucose-Capillary 59 (L) 70 - 99 mg/dL  Glucose, capillary     Status: Abnormal   Collection Time: 08/16/18  4:58 PM  Result Value Ref Range   Glucose-Capillary 68 (L) 70 - 99 mg/dL  Glucose, capillary     Status: Abnormal   Collection Time: 08/16/18  7:24 PM  Result Value Ref Range   Glucose-Capillary 129 (H) 70 - 99 mg/dL  Hepatitis B surface antigen     Status: None   Collection Time: 08/16/18  8:07 PM  Result Value Ref Range   Hepatitis B Surface Ag Negative Negative    Comment: (NOTE) Performed At: [facility] [place], [place] [id number] Rush Farmer MD [id number]   Glucose, capillary     Status: Abnormal   Collection Time: 08/17/18 12:41 AM  Result Value Ref Range   Glucose-Capillary 68 (L) 70 - 99 mg/dL  Glucose, capillary     Status: Abnormal   Collection Time: 08/17/18  1:24 AM  Result Value Ref Range   Glucose-Capillary 104 (H) 70 - 99 mg/dL  Basic metabolic panel     Status: Abnormal   Collection Time: 08/17/18  4:42 AM  Result Value Ref Range   Sodium 137 135 - 145 mmol/L   Potassium 4.4 3.5 - 5.1 mmol/L   Chloride 98 98 - 111 mmol/L   CO2 26 22 - 32 mmol/L   Glucose, Bld 92 70 - 99 mg/dL   BUN 35 (H) 6 - 20 mg/dL   Creatinine, Ser 10.38 (H) 0.61 - 1.24 mg/dL    Comment: DELTA CHECK NOTED   Calcium 8.2 (L) 8.9 - 10.3 mg/dL   GFR calc non Af Amer 5 (L) >60 mL/min   GFR calc Af Amer 6 (L) >60 mL/min   Anion gap 13 5 - 15    Comment: Performed  at [facility], [address], [place], [place] [address]  Glucose, capillary     Status: Abnormal   Collection Time: 08/17/18  8:25 AM  Result Value Ref Range   Glucose-Capillary 67 (L) 70 - 99 mg/dL  Glucose, capillary     Status: None   Collection Time: 08/17/18 12:35 PM  Result Value Ref Range   Glucose-Capillary 84 70 - 99 mg/dL  Glucose, capillary     Status: None   Collection Time: 08/17/18  9:37 PM  Result Value Ref Range   Glucose-Capillary 86 70 - 99 mg/dL  Basic metabolic panel     Status: Abnormal   Collection Time: 08/18/18  3:54 AM  Result Value Ref Range   Sodium 135 135 - 145 mmol/L   Potassium 5.8 (H) 3.5 - 5.1 mmol/L   Chloride 97 (L) 98 - 111 mmol/L   CO2 24 22 - 32 mmol/L   Glucose, Bld 81 70 - 99 mg/dL   BUN 46 (H) 6 - 20 mg/dL   Creatinine, Ser 12.12 (H) 0.61 - 1.24 mg/dL   Calcium 8.3 (L) 8.9 - 10.3 mg/dL   GFR calc non Af Amer 4 (L) >60 mL/min   GFR calc Af Amer 5 (L) >60 mL/min   Anion gap 14 5 - 15    Comment: Performed at [facility], [address], [place], [place] [address]    Current Facility-Administered Medications  Medication Dose Route Frequency Provider Last Rate Last Dose  . acetaminophen (TYLENOL) tablet 650 mg  650 mg Oral Q6H PRN Arrien, Jimmy Picket, MD       Or  . acetaminophen (TYLENOL) suppository 650 mg  650 mg Rectal Q6H PRN Arrien, Jimmy Picket, MD      . amLODipine (NORVASC) tablet 10 mg  10 mg Oral Daily Tawni Millers, MD   10 mg at 08/17/18 0855  . calcium acetate (PHOSLO) capsule 1,334 mg  1,334 mg Oral TID WC Arrien, Jimmy Picket, MD   1,334 mg at 08/17/18 1635  . carvedilol (COREG) tablet 25 mg  25 mg Oral BID WC Arrien, Jimmy Picket, MD   25 mg at 08/17/18 1636  . Chlorhexidine Gluconate Cloth 2 % PADS 6 each  6 each Topical Q0600 Roney Jaffe, MD   6 each at 08/18/18 [phone]  . Chlorhexidine Gluconate Cloth 2 % PADS 6 each  6 each Topical Q0600 Roney Jaffe, MD   6 each at 08/18/18  0612  . heparin injection 3,500 Units  3,500 Units Dialysis PRN Roney Jaffe, MD   3,500 Units at 08/16/18 2022  . heparin injection 5,000 Units  5,000 Units Subcutaneous Q8H Arrien, Jimmy Picket, MD      . isosorbide-hydrALAZINE (BIDIL) 20-37.5 MG per tablet 1 tablet  1 tablet Oral TID Arrien, Jimmy Picket, MD   1 tablet at 08/17/18 2246  . lanthanum (FOSRENOL) chewable tablet 1,000 mg  1,000 mg Oral TID WC Arrien, Jimmy Picket, MD   1,000 mg at 08/17/18 1244  . levETIRAcetam (KEPPRA) tablet 500 mg  500 mg Oral BID Tawni Millers, MD   500 mg at 08/17/18 2246  . lidocaine-prilocaine (EMLA) cream 1 application  1 application Topical PRN Arrien, Jimmy Picket, MD      . multivitamin (RENA-VIT) tablet 1 tablet  1 tablet Oral QHS Arrien, Jimmy Picket, MD   1 tablet at 08/17/18 0048  . ondansetron (ZOFRAN) tablet 4 mg  4 mg Oral Q6H PRN Arrien, Jimmy Picket, MD       Or  . ondansetron [facility]) injection 4 mg  4 mg Intravenous Q6H PRN Arrien, Jimmy Picket, MD      . pravastatin (PRAVACHOL) tablet 20 mg  20 mg Oral QPM Arrien, Jimmy Picket, MD   20 mg at 08/17/18 1636  . sodium polystyrene (KAYEXALATE) 15 GM/60ML suspension 30 g  30 g Oral Once Arrien, Jimmy Picket, MD      . sodium zirconium cyclosilicate (LOKELMA) packet 10 g  10 g Oral Once Arrien, Jimmy Picket, MD        Musculoskeletal: Strength & Muscle Tone: within normal limits Gait & Station: UTA since patient is lying in bed. Patient leans: N/A  Psychiatric Specialty Exam: Physical Exam  Nursing note and vitals reviewed. Constitutional: He is oriented to person, place, and time. He appears well-developed and well-nourished.  HENT:  Head: Normocephalic and atraumatic.  Neck: Normal range of motion.  Respiratory: Effort normal.  Musculoskeletal: Normal range of motion.  Neurological: He is alert and oriented to person, place, and time.  Psychiatric: His speech is normal and behavior is normal.  Judgment and thought content normal. Cognition and memory are normal. He exhibits a depressed mood.    Review of Systems  Cardiovascular: Negative for chest pain.  Gastrointestinal: Negative for abdominal pain, constipation, diarrhea, nausea and vomiting.  Psychiatric/Behavioral: Positive for  depression. Negative for hallucinations, substance abuse and suicidal ideas.  All other systems reviewed and are negative.   Blood pressure (!) 153/97, pulse 75, temperature 98.6 F (37 C), temperature source Oral, resp. rate 18, height 5\' 2"  (1.575 m), weight 101.3 kg, SpO2 95 %.Body mass index is 40.85 kg/m.  General Appearance: Fairly Groomed, middle aged, African American male, wearing a hospital gown who is lying on his left side in bed. NAD.   Eye Contact:  Good  Speech:  Clear and Coherent and Normal Rate  Volume:  Normal  Mood:  Depressed  Affect:  Congruent  Thought Process:  Goal Directed, Linear and Descriptions of Associations: Intact  Orientation:  Full (Time, Place, and Person)  Thought Content:  Logical  Suicidal Thoughts:  No  Homicidal Thoughts:  No  Memory:  Immediate;   Good Recent;   Good Remote;   Good  Judgement:  Fair  Insight:  Fair  Psychomotor Activity:  Normal  Concentration:  Concentration: Good and Attention Span: Good  Recall:  Good  Fund of Knowledge:  Good  Language:  Fair  Akathisia:  No  Handed:  Right  AIMS (if indicated):   N/A  Assets:  Communication Skills Desire for Improvement Resilience  ADL's:  Intact  Cognition:  WNL  Sleep:   Fair   Assessment:  George [name] is a 48 y.o. male who was admitted with hyperkalemia. He endorses depressed mood since 2016 in the setting of multiple psychosocial stressors including homelessness. He denies SI, HI or AVH. He declines medication management for depression. He is willing to speak to a therapist. He does not warrant inpatient psychiatric hospitalization at this time.   Treatment Plan Summary: -Discussed  starting an antidepressant with patient but he declines medication management at this time.  -EKG reviewed and QTc 490 on 2/17. Please closely monitor when starting or increasing QTc prolonging agents.  -Please have SW provide patient with outpatient therapy resources and shelter resources.  -Psychiatry will sign off on patient at this time. Please consult psychiatry again as needed.   Disposition: No evidence of imminent risk to self or others at present.   Patient does not meet criteria for psychiatric inpatient admission.  Faythe Dingwall, DO 08/18/2018 11:09 AM

## 2018-08-18 NOTE — Progress Notes (Signed)
[facility] Progress Note  Subjective: seen on HD, no new c/o's, going for 4 L again today.   Vitals:   08/18/18 1030 08/18/18 1100 08/18/18 1130 08/18/18 1207  BP: (!) 153/97 (!) 165/94 (!) 147/89 (!) 142/75  Pulse: 75 77 78 80  Resp:   18 18  Temp:   97.7 F (36.5 C) 98.6 F (37 C)  TempSrc:   Oral Oral  SpO2:   96% 100%  Weight:   97.3 kg   Height:        Inpatient medications: . amLODipine  10 mg Oral Daily  . calcium acetate  1,334 mg Oral TID WC  . carvedilol  25 mg Oral BID WC  . Chlorhexidine Gluconate Cloth  6 each Topical Q0600  . Chlorhexidine Gluconate Cloth  6 each Topical Q0600  . heparin  5,000 Units Subcutaneous Q8H  . isosorbide-hydrALAZINE  1 tablet Oral TID  . lanthanum  1,000 mg Oral TID WC  . levETIRAcetam  500 mg Oral BID  . multivitamin  1 tablet Oral QHS  . pravastatin  20 mg Oral QPM  . sodium polystyrene  30 g Oral Once  . sodium zirconium cyclosilicate  10 g Oral Once    acetaminophen **OR** acetaminophen, lidocaine-prilocaine, ondansetron **OR** ondansetron (ZOFRAN) IV  Iron/TIBC/Ferritin/ %Sat    Component Value Date/Time   IRON 145 06/16/2015 1352   TIBC 200 (L) 06/16/2015 1352   FERRITIN 846 (H) 06/16/2015 1352   IRONPCTSAT 72 (H) 06/16/2015 1352    Exam: Gen alert, no distress, up in bed No jvd Chest cta bilat RRR no MRG Abd soft ntnd no mass or ascites +bs Ext 1+ edema LE's Neuro is alert, Ox 3 , nf  LUA AVF    Home meds:  - amlodipine 10/ carvedilol 25 bid/ isosorbide-hydralazine 20-37.5 bid  - pravastatin 20/ calc acetate 2 ac tid/ lanthanum 1gm ac  - levetiracetam 500 bid   Dialysis: [facility] MWF   4.5h    94.5kg  2/2.5 bath  14ga  400/800  Hep none  LUA AVF     Assessment/ Plan: 1. SOB/ pulm edema: SOB better, still sig vol ^^, should be better w HD now 2. Homeless: Per HD staff at [facility] he has a transportation service arranged which will pick him up anywhere, he just needs to  contact them ahead of time with a place that has an address.   3. Hyperkalemia - resolved w/ HD 4. ESRD on HD MWF. HD today.  5. HTN cont coreg/ amlod/ bidil, BP's up and vol up 6. Seizure d/o 7. Anemia ckd - Hb 12 no need for esa 8. MBD ckd - cont meds      [facility] 08/18/2018, 12:16 PM  Recent Labs  Lab 08/17/18 0442 08/18/18 0354  NA 137 135  K 4.4 5.8*  CL 98 97*  CO2 26 24  GLUCOSE 92 81  BUN 35* 46*  CREATININE 10.38* 12.12*  CALCIUM 8.2* 8.3*   No results for input(s): AST, ALT, ALKPHOS, BILITOT, PROT in the last 168 hours. Recent Labs  Lab 08/16/18 1156  WBC 5.5  HGB 12.6*  HCT 39.6  MCV 99.5  PLT 177

## 2018-08-19 DIAGNOSIS — E8770 Fluid overload, unspecified: Secondary | ICD-10-CM | POA: Diagnosis not present

## 2018-08-19 DIAGNOSIS — N2581 Secondary hyperparathyroidism of renal origin: Secondary | ICD-10-CM | POA: Diagnosis not present

## 2018-08-19 DIAGNOSIS — I120 Hypertensive chronic kidney disease with stage 5 chronic kidney disease or end stage renal disease: Secondary | ICD-10-CM | POA: Diagnosis not present

## 2018-08-19 DIAGNOSIS — J810 Acute pulmonary edema: Secondary | ICD-10-CM | POA: Diagnosis not present

## 2018-08-19 DIAGNOSIS — Z992 Dependence on renal dialysis: Secondary | ICD-10-CM | POA: Diagnosis not present

## 2018-08-19 DIAGNOSIS — D631 Anemia in chronic kidney disease: Secondary | ICD-10-CM | POA: Diagnosis not present

## 2018-08-19 DIAGNOSIS — N186 End stage renal disease: Secondary | ICD-10-CM | POA: Diagnosis not present

## 2018-08-19 DIAGNOSIS — E875 Hyperkalemia: Secondary | ICD-10-CM | POA: Diagnosis not present

## 2018-08-19 LAB — RENAL FUNCTION PANEL
Albumin: 3 g/dL — ABNORMAL LOW (ref 3.5–5.0)
Anion gap: 15 (ref 5–15)
BUN: 28 mg/dL — ABNORMAL HIGH (ref 6–20)
CO2: 23 mmol/L (ref 22–32)
Calcium: 8.6 mg/dL — ABNORMAL LOW (ref 8.9–10.3)
Chloride: 97 mmol/L — ABNORMAL LOW (ref 98–111)
Creatinine, Ser: 8.47 mg/dL — ABNORMAL HIGH (ref 0.61–1.24)
GFR calc Af Amer: 8 mL/min — ABNORMAL LOW (ref >60)
GFR calc non Af Amer: 7 mL/min — ABNORMAL LOW (ref >60)
Glucose, Bld: 79 mg/dL (ref 70–99)
Phosphorus: 4.6 mg/dL (ref 2.5–4.6)
Potassium: 4.7 mmol/L (ref 3.5–5.1)
Sodium: 135 mmol/L (ref 135–145)

## 2018-08-19 LAB — GLUCOSE, CAPILLARY
Glucose-Capillary: 66 mg/dL — ABNORMAL LOW (ref 70–99)
Glucose-Capillary: 99 mg/dL (ref 70–99)
Glucose-Capillary: 77 mg/dL (ref 70–99)
Glucose-Capillary: 117 mg/dL — ABNORMAL HIGH (ref 70–99)
Glucose-Capillary: 218 mg/dL — ABNORMAL HIGH (ref 70–99)

## 2018-08-19 MED ORDER — IPRATROPIUM-ALBUTEROL 0.5-2.5 (3) MG/3ML IN SOLN: 3.0000 mL | Freq: Four times a day (QID) | RESPIRATORY_TRACT | Status: DC | PRN

## 2018-08-19 MED ORDER — IPRATROPIUM-ALBUTEROL 0.5-2.5 (3) MG/3ML IN SOLN
3.0000 mL | Freq: Two times a day (BID) | RESPIRATORY_TRACT | Status: DC
  Administered 2018-08-20 – 2018-08-21 (×3): 3 mL via RESPIRATORY_TRACT
  Filled 2018-08-19 (×4): qty 3

## 2018-08-19 MED ORDER — METHYLPREDNISOLONE SODIUM SUCC 40 MG IJ SOLR
40.0000 mg | Freq: Four times a day (QID) | INTRAMUSCULAR | Status: DC
  Administered 2018-08-19 (×2): 40 mg via INTRAVENOUS
  Filled 2018-08-19 (×3): qty 1

## 2018-08-19 MED ORDER — IPRATROPIUM-ALBUTEROL 0.5-2.5 (3) MG/3ML IN SOLN
3.0000 mL | Freq: Four times a day (QID) | RESPIRATORY_TRACT | Status: DC
  Administered 2018-08-19: 3 mL via RESPIRATORY_TRACT
  Filled 2018-08-19 (×2): qty 3

## 2018-08-19 MED ORDER — CHLORHEXIDINE GLUCONATE CLOTH 2 % EX PADS: 6.0000 | MEDICATED_PAD | Freq: Every day | CUTANEOUS | Status: DC

## 2018-08-19 NOTE — Care Management Important Message (Signed)
Important Message  Patient Details  Name: George [name] MRN: [id number] Date of Birth: January 03, 1971   Medicare Important Message Given:  Yes    Annis Lagoy P [place] 08/19/2018, 11:11 AM

## 2018-08-19 NOTE — Progress Notes (Signed)
PT. Refusing morning labs.

## 2018-08-19 NOTE — Progress Notes (Signed)
PROGRESS NOTE    George [name]  QQP:[id number] DOB: 31-Jul-1970 DOA: 08/16/2018 PCP: Shelby Mattocks, PA-C   Brief Narrative:  48 year old with past medical history relevant for ESRD on dialysis on M/W/F, noncompliant, hypertension, seizure disorder, hyperlipidemia who presents with acute hypoxic respiratory failure and hyperkalemia due to missing dialysis for social reasons.   Assessment & Plan:   Principal Problem:   MDD (major depressive disorder), single episode, moderate ([facility]) Active Problems:   Hypoxia   Acute respiratory failure ([facility])   ESRD on peritoneal dialysis [facility])   Essential hypertension   Hyperkalemia   Acute pulmonary edema ([place])   Seizure ([place])   #) Acute hypoxic respiratory failure: This is improved somewhat however patient is now wheezing today. -Start IV methylprednisolone 40 mg every 8 hours - Every 6 hours scheduled bronchodilators   #) ESRD/hyperkalemia: Patient is status post serial dialysis with improvement in shortness of breath and pulmonary edema -Nephrology consulted, appreciate recommendations -Continue PhosLo with meals -Continue calcitriol Monday Tuesday Wednesday with dialysis  #) Hypertension/hyperlipidemia: -Continue amlodipine 10 mg daily -Continue carvedilol 25 mg twice daily -Continue isosorbide 20 mg 3 times daily -Continue hydralazine 37.5 mg 3 times daily - Continue pravastatin 20 mg nightly  #) Seizure disorder: -Continue levetiracetam 500 mg twice daily  Fluids: Restrict Electrolytes: Monitor and supplement Nutrition: Renal diet   Prophylaxis: Subcu heparin  n Disposition: Pending improved respiratory status  Full code   Consultants:   Nephrology  Procedures:   None  Antimicrobials:   None none   Subjective: This morning the patient reports that he is still short of breath.  He is audibly wheezing.  He denies any chest pain, nausea, vomiting, diarrhea, orthopnea, lower extremity  edema.  Objective: Vitals:   08/18/18 2035 08/19/18 0442 08/19/18 0447 08/19/18 0902  BP: 130/71  (!) 147/83 (!) 167/101  Pulse: 73  73 76  Resp: 20  20   Temp: 98.9 F (37.2 C)  98.6 F (37 C)   TempSrc: Oral  Oral   SpO2: 93%  92%   Weight:  97.6 kg    Height:        Intake/Output Summary (Last 24 hours) at 08/19/2018 1045 Last data filed at 08/19/2018 7124 Gross per 24 hour  Intake 720 ml  Output 4025 ml  Net -3305 ml   Filed Weights   08/18/18 0646 08/18/18 1130 08/19/18 0442  Weight: 101.3 kg 97.3 kg 97.6 kg    Examination:  General exam: Appears calm and comfortable  Respiratory system: Mildly increased work of breathing, diffuse wheezing in bilateral lung sounds worse on right, no crackles or rhonchi Cardiovascular system: Regular rate and rhythm, 2 out of 6 systolic murmur that is transmitted from fistula Gastrointestinal system: Soft, nondistended, no rebound or guarding, plus bowel sounds Central nervous system: Alert and oriented.  Grossly intact, moving all extremities Extremities: No lower extremity edema, left upper extremity fistula with palpable thrill and audible bruit. Skin: No rashes over visible skin Psychiatry: Judgement and insight appear normal. Mood & affect appropriate.     Data Reviewed: I have personally reviewed following labs and imaging studies  CBC: Recent Labs  Lab 08/16/18 1156  WBC 5.5  HGB 12.6*  HCT 39.6  MCV 99.5  PLT 580   Basic Metabolic Panel: Recent Labs  Lab 08/16/18 1156 08/17/18 0442 08/18/18 0354 08/19/18 0805  NA 140 137 135 135  K 7.2* 4.4 5.8* 4.7  CL 102 98 97* 97*  CO2 21* 26 24 23  GLUCOSE 75 92 81 79  BUN 77* 35* 46* 28*  CREATININE 17.20* 10.38* 12.12* 8.47*  CALCIUM 9.0 8.2* 8.3* 8.6*  PHOS  --   --   --  4.6   GFR: Estimated Creatinine Clearance: 11.1 mL/min (A) (by C-G formula based on SCr of 8.47 mg/dL (H)). Liver Function Tests: Recent Labs  Lab 08/19/18 0805  ALBUMIN 3.0*   No  results for input(s): LIPASE, AMYLASE in the last 168 hours. No results for input(s): AMMONIA in the last 168 hours. Coagulation Profile: No results for input(s): INR, PROTIME in the last 168 hours. Cardiac Enzymes: No results for input(s): CKTOTAL, CKMB, CKMBINDEX, TROPONINI in the last 168 hours. BNP (last 3 results) No results for input(s): PROBNP in the last 8760 hours. HbA1C: No results for input(s): HGBA1C in the last 72 hours. CBG: Recent Labs  Lab 08/18/18 1228 08/18/18 1659 08/18/18 2048 08/19/18 0755 08/19/18 0850  GLUCAP 78 111* 95 66* 99   Lipid Profile: No results for input(s): CHOL, HDL, LDLCALC, TRIG, CHOLHDL, LDLDIRECT in the last 72 hours. Thyroid Function Tests: No results for input(s): TSH, T4TOTAL, FREET4, T3FREE, THYROIDAB in the last 72 hours. Anemia Panel: No results for input(s): VITAMINB12, FOLATE, FERRITIN, TIBC, IRON, RETICCTPCT in the last 72 hours. Sepsis Labs: No results for input(s): PROCALCITON, LATICACIDVEN in the last 168 hours.  Recent Results (from the past 240 hour(s))  MRSA PCR Screening     Status: None   Collection Time: 08/11/18  1:42 AM  Result Value Ref Range Status   MRSA by PCR NEGATIVE NEGATIVE Final    Comment:        The GeneXpert MRSA Assay (FDA approved for NASAL specimens only), is one component of a comprehensive MRSA colonization surveillance program. It is not intended to diagnose MRSA infection nor to guide or monitor treatment for MRSA infections. Performed at [facility], [place] [address] [place], [place] [address]          Radiology Studies: No results found.      Scheduled Meds: . amLODipine  10 mg Oral Daily  . calcium acetate  1,334 mg Oral TID WC  . carvedilol  25 mg Oral BID WC  . Chlorhexidine Gluconate Cloth  6 each Topical Q0600  . Chlorhexidine Gluconate Cloth  6 each Topical Q0600  . heparin  5,000 Units Subcutaneous Q8H  . isosorbide-hydrALAZINE  1 tablet Oral TID  . lanthanum   1,000 mg Oral TID WC  . levETIRAcetam  500 mg Oral BID  . multivitamin  1 tablet Oral QHS  . pravastatin  20 mg Oral QPM  . sodium polystyrene  30 g Oral Once  . sodium zirconium cyclosilicate  10 g Oral Once   Continuous Infusions:   LOS: 3 days    Time spent: [place], MD [facility]   If 7PM-7AM, please contact night-coverage [email or web address] Password [email or web address] 08/19/2018, 10:45 AM

## 2018-08-19 NOTE — Progress Notes (Signed)
[facility] Progress Note  Subjective: 4L off w/ HD yesterday  Vitals:   08/19/18 0442 08/19/18 0447 08/19/18 0902 08/19/18 1119  BP:  (!) 147/83 (!) 167/101 126/71  Pulse:  73 76 70  Resp:  20    Temp:  98.6 F (37 C)  98.4 F (36.9 C)  TempSrc:  Oral  Oral  SpO2:  92%  94%  Weight: 97.6 kg     Height:        Inpatient medications: . amLODipine  10 mg Oral Daily  . calcium acetate  1,334 mg Oral TID WC  . carvedilol  25 mg Oral BID WC  . Chlorhexidine Gluconate Cloth  6 each Topical Q0600  . Chlorhexidine Gluconate Cloth  6 each Topical Q0600  . heparin  5,000 Units Subcutaneous Q8H  . ipratropium-albuterol  3 mL Nebulization Q6H  . isosorbide-hydrALAZINE  1 tablet Oral TID  . lanthanum  1,000 mg Oral TID WC  . levETIRAcetam  500 mg Oral BID  . methylPREDNISolone (SOLU-MEDROL) injection  40 mg Intravenous Q6H  . multivitamin  1 tablet Oral QHS  . pravastatin  20 mg Oral QPM  . sodium polystyrene  30 g Oral Once  . sodium zirconium cyclosilicate  10 g Oral Once    acetaminophen **OR** acetaminophen, lidocaine-prilocaine, ondansetron **OR** ondansetron (ZOFRAN) IV  Iron/TIBC/Ferritin/ %Sat    Component Value Date/Time   IRON 145 06/16/2015 1352   TIBC 200 (L) 06/16/2015 1352   FERRITIN 846 (H) 06/16/2015 1352   IRONPCTSAT 72 (H) 06/16/2015 1352    Exam: Gen alert, no distress No jvd Chest cta bilat RRR no MRG Abd soft ntnd no mass or ascites +bs Ext 1+ edema LE's Neuro is alert, Ox 3 , nf  LUA AVF    Home meds:  - amlodipine 10/ carvedilol 25 bid/ isosorbide-hydralazine 20-37.5 bid  - pravastatin 20/ calc acetate 2 ac tid/ lanthanum 1gm ac  - levetiracetam 500 bid   Dialysis: [facility] MWF   4.5h    94.5kg  2/2.5 bath  14ga  400/800  Hep none  LUA AVF     Assessment/ Plan: 1. Vol excess/ pulm edema: resolved clinically. Will get CXR w/ wheezing today reported. 3kg up today. Plan HD tomorrow on schedule, UF to dry  wt.  2. Homeless: Per HD staff at [facility] he has a transportation service arranged which will pick him up anywhere, he just needs to contact them ahead of time with a place that has an address.   3. ESRD on HD MWF. HD Friday, UF to dry 4. HTN cont coreg/ amlod/ bidil 5. Seizure d/o 6. Anemia ckd - Hb 12 no need for esa 7. MBD ckd - cont meds      [facility] 08/19/2018, 2:23 PM  Recent Labs  Lab 08/18/18 0354 08/19/18 0805  NA 135 135  K 5.8* 4.7  CL 97* 97*  CO2 24 23  GLUCOSE 81 79  BUN 46* 28*  CREATININE 12.12* 8.47*  CALCIUM 8.3* 8.6*  PHOS  --  4.6  ALBUMIN  --  3.0*   No results for input(s): AST, ALT, ALKPHOS, BILITOT, PROT in the last 168 hours. Recent Labs  Lab 08/16/18 1156  WBC 5.5  HGB 12.6*  HCT 39.6  MCV 99.5  PLT 177

## 2018-08-20 DIAGNOSIS — E8770 Fluid overload, unspecified: Secondary | ICD-10-CM | POA: Diagnosis not present

## 2018-08-20 DIAGNOSIS — F322 Major depressive disorder, single episode, severe without psychotic features: Secondary | ICD-10-CM | POA: Diagnosis not present

## 2018-08-20 DIAGNOSIS — N2581 Secondary hyperparathyroidism of renal origin: Secondary | ICD-10-CM | POA: Diagnosis not present

## 2018-08-20 DIAGNOSIS — N186 End stage renal disease: Secondary | ICD-10-CM | POA: Diagnosis not present

## 2018-08-20 DIAGNOSIS — D631 Anemia in chronic kidney disease: Secondary | ICD-10-CM | POA: Diagnosis not present

## 2018-08-20 DIAGNOSIS — I120 Hypertensive chronic kidney disease with stage 5 chronic kidney disease or end stage renal disease: Secondary | ICD-10-CM | POA: Diagnosis not present

## 2018-08-20 DIAGNOSIS — Z992 Dependence on renal dialysis: Secondary | ICD-10-CM | POA: Diagnosis not present

## 2018-08-20 DIAGNOSIS — E875 Hyperkalemia: Secondary | ICD-10-CM | POA: Diagnosis not present

## 2018-08-20 DIAGNOSIS — J810 Acute pulmonary edema: Secondary | ICD-10-CM | POA: Diagnosis not present

## 2018-08-20 LAB — GLUCOSE, CAPILLARY
GLUCOSE-CAPILLARY: 213 mg/dL — AB (ref 70–99)
Glucose-Capillary: 130 mg/dL — ABNORMAL HIGH (ref 70–99)
Glucose-Capillary: 124 mg/dL — ABNORMAL HIGH (ref 70–99)
Glucose-Capillary: 119 mg/dL — ABNORMAL HIGH (ref 70–99)

## 2018-08-20 LAB — CBC
HCT: 30 % — ABNORMAL LOW (ref 39.0–52.0)
Hemoglobin: 10.4 g/dL — ABNORMAL LOW (ref 13.0–17.0)
MCH: 33 pg (ref 26.0–34.0)
MCHC: 34.7 g/dL (ref 30.0–36.0)
MCV: 95.2 fL (ref 80.0–100.0)
Platelets: 153 10*3/uL (ref 150–400)
RBC: 3.15 MIL/uL — ABNORMAL LOW (ref 4.22–5.81)
RDW: 15 % (ref 11.5–15.5)
WBC: 8.9 10*3/uL (ref 4.0–10.5)
nRBC: 0 % (ref 0.0–0.2)

## 2018-08-20 LAB — RENAL FUNCTION PANEL
ANION GAP: 14 (ref 5–15)
Albumin: 3.2 g/dL — ABNORMAL LOW (ref 3.5–5.0)
BUN: 49 mg/dL — ABNORMAL HIGH (ref 6–20)
CO2: 22 mmol/L (ref 22–32)
Calcium: 9.1 mg/dL (ref 8.9–10.3)
Chloride: 96 mmol/L — ABNORMAL LOW (ref 98–111)
Creatinine, Ser: 11.05 mg/dL — ABNORMAL HIGH (ref 0.61–1.24)
GFR calc Af Amer: 6 mL/min — ABNORMAL LOW (ref >60)
GFR calc non Af Amer: 5 mL/min — ABNORMAL LOW (ref >60)
Glucose, Bld: 137 mg/dL — ABNORMAL HIGH (ref 70–99)
Phosphorus: 4.4 mg/dL (ref 2.5–4.6)
Potassium: 4.8 mmol/L (ref 3.5–5.1)
SODIUM: 132 mmol/L — AB (ref 135–145)

## 2018-08-20 MED ORDER — ESCITALOPRAM OXALATE 10 MG PO TABS
10.0000 mg | ORAL_TABLET | Freq: Every day | ORAL | Status: DC
  Administered 2018-08-20 – 2018-08-30 (×11): 10 mg via ORAL
  Filled 2018-08-20 (×11): qty 1

## 2018-08-20 MED ORDER — TRAZODONE HCL 50 MG PO TABS
50.0000 mg | ORAL_TABLET | Freq: Every evening | ORAL | Status: DC | PRN
  Administered 2018-08-22 – 2018-08-29 (×5): 50 mg via ORAL
  Filled 2018-08-20 (×6): qty 1

## 2018-08-20 MED ORDER — PREDNISONE 20 MG PO TABS
40.0000 mg | ORAL_TABLET | Freq: Every day | ORAL | Status: DC
  Administered 2018-08-21 – 2018-08-24 (×4): 40 mg via ORAL
  Filled 2018-08-20 (×4): qty 2

## 2018-08-20 MED ORDER — PREDNISONE 20 MG PO TABS: 40.0000 mg | ORAL_TABLET | Freq: Every day | ORAL | Status: DC

## 2018-08-20 NOTE — Consult Note (Signed)
[facility] Psych Consult Progress Note  08/20/2018 12:32 PM George [name]  MRN:  [id number] Subjective:   George [name] was last seen on 2/19 by the psychiatry consult service for depression. He declined medication management but was willing to speak to an outpatient therapist. Today, psychiatry is reconsulted due to patient refusing dialysis and medical treatment.  A capacity evaluation is also requested. Per nephrology progress note, he reported that he is depressed and is not sure whether he wants to live or not. He is requesting to leave today because he wants to eat food that doesn't make him sick.   On interview, George [name] reports worsening mood and SI. He reports choosing to complete dialysis today but he does believe that he refused earlier because he feels like he does not care if he is dead. He denies a specific plan but he is unable to safety plan. He denies HI or AVH. He reports poor sleep. He denies problems with his appetite. He is willing to start a medication for his mood today.   Principal Problem: MDD (major depressive disorder), single episode, severe , no psychosis ([place]) Diagnosis:  Principal Problem:   MDD (major depressive disorder), single episode, severe , no psychosis ([place]) Active Problems:   Hypoxia   Acute respiratory failure ([place])   ESRD on peritoneal dialysis ([place])   Essential hypertension   Hyperkalemia   Acute pulmonary edema ([place])   Seizure ([place])   MDD (major depressive disorder), single episode, moderate ([place])  Total Time spent with patient: 15 minutes  Past Psychiatric History: Denies   Past Medical History:  Past Medical History:  Diagnosis Date  . CHF (congestive heart failure) ([place])   . Diabetes mellitus without complication ([place])   . Hypertension   . Renal disorder   . Seizure [facility])     Past Surgical History:  Procedure Laterality Date  . AV FISTULA PLACEMENT Left 06/19/2015   Procedure: LEFT ARTERIOVENOUS (AV) FISTULA CREATION;  Surgeon: Elam Dutch,  MD;  Location: [place];  Service: Vascular;  Laterality: Left;  . CAPD REMOVAL N/A 06/22/2015   Procedure: CONTINUOUS AMBULATORY PERITONEAL DIALYSIS  (CAPD) CATHETER REMOVAL;  Surgeon: Ralene Ok, MD;  Location: [place];  Service: General;  Laterality: N/A;  . ESOPHAGOGASTRODUODENOSCOPY N/A 06/21/2015   Procedure: ESOPHAGOGASTRODUODENOSCOPY (EGD);  Surgeon: Laurence Spates, MD;  Location: [facility] ENDOSCOPY;  Service: Endoscopy;  Laterality: N/A;  . INSERTION OF DIALYSIS CATHETER Left 06/19/2015   Procedure: INSERTION OF DIALYSIS CATHETER LEFT INTERNAL JUGULAR;  Surgeon: Elam Dutch, MD;  Location: [place];  Service: Vascular;  Laterality: Left;  . peritoneal dialysis catheter placed     around 2015 in [place]   Family History:  Family History  Problem Relation Age of Onset  . Sudden Cardiac Death Neg Hx    Family Psychiatric  History: Denies  Social History:  Social History   Substance and Sexual Activity  Alcohol Use No     Social History   Substance and Sexual Activity  Drug Use Yes  . Types: Marijuana    Social History   Socioeconomic History  . Marital status: Single    Spouse name: Not on file  . Number of children: Not on file  . Years of education: Not on file  . Highest education level: Not on file  Occupational History  . Not on file  Social Needs  . Financial resource strain: Very hard  . Food insecurity:    Worry: Often true    Inability: Often  true  . Transportation needs:    Medical: Yes    Non-medical: Yes  Tobacco Use  . Smoking status: Former Smoker    Types: Cigarettes  . Smokeless tobacco: Never Used  Substance and Sexual Activity  . Alcohol use: No  . Drug use: Yes    Types: Marijuana  . Sexual activity: Not on file  Lifestyle  . Physical activity:    Days per week: Not on file    Minutes per session: Not on file  . Stress: Not on file  Relationships  . Social connections:    Talks on phone: Not on file    Gets together: Not on file     Attends religious service: Not on file    Active member of club or organization: Not on file    Attends meetings of clubs or organizations: Not on file    Relationship status: Not on file  Other Topics Concern  . Not on file  Social History Narrative  . Not on file    Sleep: Poor  Appetite:  Good  Current Medications: Current Facility-Administered Medications  Medication Dose Route Frequency Provider Last Rate Last Dose  . acetaminophen (TYLENOL) tablet 650 mg  650 mg Oral Q6H PRN Arrien, Jimmy Picket, MD       Or  . acetaminophen (TYLENOL) suppository 650 mg  650 mg Rectal Q6H PRN Arrien, Jimmy Picket, MD      . amLODipine (NORVASC) tablet 10 mg  10 mg Oral Daily Arrien, Jimmy Picket, MD   10 mg at 08/20/18 1042  . calcium acetate (PHOSLO) capsule 1,334 mg  1,334 mg Oral TID WC Arrien, Jimmy Picket, MD   1,334 mg at 08/20/18 1042  . carvedilol (COREG) tablet 25 mg  25 mg Oral BID WC Arrien, Jimmy Picket, MD   25 mg at 08/20/18 1043  . heparin injection 5,000 Units  5,000 Units Subcutaneous Q8H Arrien, Jimmy Picket, MD      . ipratropium-albuterol (DUONEB) 0.5-2.5 (3) MG/3ML nebulizer solution 3 mL  3 mL Nebulization BID Purohit, Konrad Dolores, MD   3 mL at 08/20/18 0805  . ipratropium-albuterol (DUONEB) 0.5-2.5 (3) MG/3ML nebulizer solution 3 mL  3 mL Nebulization Q6H PRN Purohit, Shrey C, MD      . isosorbide-hydrALAZINE (BIDIL) 20-37.5 MG per tablet 1 tablet  1 tablet Oral TID Arrien, Jimmy Picket, MD   1 tablet at 08/20/18 1042  . lanthanum (FOSRENOL) chewable tablet 1,000 mg  1,000 mg Oral TID WC Arrien, Jimmy Picket, MD   1,000 mg at 08/20/18 1042  . levETIRAcetam (KEPPRA) tablet 500 mg  500 mg Oral BID Tawni Millers, MD   500 mg at 08/20/18 1043  . lidocaine-prilocaine (EMLA) cream 1 application  1 application Topical PRN Arrien, Jimmy Picket, MD      . multivitamin (RENA-VIT) tablet 1 tablet  1 tablet Oral QHS Arrien, Jimmy Picket, MD   1 tablet  at 08/19/18 2322  . ondansetron (ZOFRAN) tablet 4 mg  4 mg Oral Q6H PRN Arrien, Jimmy Picket, MD       Or  . ondansetron [facility]) injection 4 mg  4 mg Intravenous Q6H PRN Arrien, Jimmy Picket, MD      . pravastatin (PRAVACHOL) tablet 20 mg  20 mg Oral QPM Arrien, Jimmy Picket, MD   20 mg at 08/19/18 1245  . [START ON 08/21/2018] predniSONE (DELTASONE) tablet 40 mg  40 mg Oral Q breakfast Purohit, Konrad Dolores, MD      .  sodium polystyrene (KAYEXALATE) 15 GM/60ML suspension 30 g  30 g Oral Once Arrien, Jimmy Picket, MD      . sodium zirconium cyclosilicate [facility]) packet 10 g  10 g Oral Once Arrien, Jimmy Picket, MD        Lab Results:  Results for orders placed or performed during the hospital encounter of 08/16/18 (from the past 48 hour(s))  Glucose, capillary     Status: Abnormal   Collection Time: 08/18/18  4:59 PM  Result Value Ref Range   Glucose-Capillary 111 (H) 70 - 99 mg/dL  Glucose, capillary     Status: None   Collection Time: 08/18/18  8:48 PM  Result Value Ref Range   Glucose-Capillary 95 70 - 99 mg/dL  Glucose, capillary     Status: Abnormal   Collection Time: 08/19/18  7:55 AM  Result Value Ref Range   Glucose-Capillary 66 (L) 70 - 99 mg/dL  Renal function panel     Status: Abnormal   Collection Time: 08/19/18  8:05 AM  Result Value Ref Range   Sodium 135 135 - 145 mmol/L   Potassium 4.7 3.5 - 5.1 mmol/L   Chloride 97 (L) 98 - 111 mmol/L   CO2 23 22 - 32 mmol/L   Glucose, Bld 79 70 - 99 mg/dL   BUN 28 (H) 6 - 20 mg/dL   Creatinine, Ser 8.47 (H) 0.61 - 1.24 mg/dL   Calcium 8.6 (L) 8.9 - 10.3 mg/dL   Phosphorus 4.6 2.5 - 4.6 mg/dL   Albumin 3.0 (L) 3.5 - 5.0 g/dL   GFR calc non Af Amer 7 (L) >60 mL/min   GFR calc Af Amer 8 (L) >60 mL/min   Anion gap 15 5 - 15    Comment: Performed at [facility], [address], [place], [place] [address]  Glucose, capillary     Status: None   Collection Time: 08/19/18  8:50 AM  Result Value Ref Range    Glucose-Capillary 99 70 - 99 mg/dL  Glucose, capillary     Status: None   Collection Time: 08/19/18 11:51 AM  Result Value Ref Range   Glucose-Capillary 77 70 - 99 mg/dL  Glucose, capillary     Status: Abnormal   Collection Time: 08/19/18  4:10 PM  Result Value Ref Range   Glucose-Capillary 117 (H) 70 - 99 mg/dL  Glucose, capillary     Status: Abnormal   Collection Time: 08/19/18  9:32 PM  Result Value Ref Range   Glucose-Capillary 218 (H) 70 - 99 mg/dL  Glucose, capillary     Status: Abnormal   Collection Time: 08/20/18  6:34 AM  Result Value Ref Range   Glucose-Capillary 130 (H) 70 - 99 mg/dL  Glucose, capillary     Status: Abnormal   Collection Time: 08/20/18  7:48 AM  Result Value Ref Range   Glucose-Capillary 124 (H) 70 - 99 mg/dL   Comment 1 Notify RN   Glucose, capillary     Status: Abnormal   Collection Time: 08/20/18 11:45 AM  Result Value Ref Range   Glucose-Capillary 213 (H) 70 - 99 mg/dL   Comment 1 Notify RN     Blood Alcohol level:  No results found for: [facility]  Musculoskeletal: Strength & Muscle Tone: within normal limits Gait & Station: UTA since patient is lying in bed while receiving dialysis. Patient leans: N/A  Psychiatric Specialty Exam: Physical Exam  Nursing note and vitals reviewed. Constitutional: He is oriented to person, place, and time.  He appears well-developed and well-nourished.  HENT:  Head: Normocephalic and atraumatic.  Neck: Normal range of motion.  Respiratory: Effort normal.  Musculoskeletal: Normal range of motion.  Neurological: He is alert and oriented to person, place, and time.  Psychiatric: His speech is normal. Judgment and thought content normal. He is withdrawn. Cognition and memory are normal. He exhibits a depressed mood.    Review of Systems  Cardiovascular: Negative for chest pain.  Gastrointestinal: Negative for abdominal pain, constipation, diarrhea, nausea and vomiting.  Psychiatric/Behavioral: Positive for  depression and suicidal ideas. Negative for hallucinations. The patient has insomnia.   All other systems reviewed and are negative.   Blood pressure (!) 161/97, pulse 81, temperature 98.3 F (36.8 C), temperature source Oral, resp. rate 20, height 5\' 2"  (1.575 m), weight 99.3 kg, SpO2 98 %.Body mass index is 40.04 kg/m.  General Appearance: Fairly Groomed, young, middle aged, African American male, wearing a hospital gown with curly hair who is lying in bed while receiving dialysis. NAD.   Eye Contact:  Good  Speech:  Clear and Coherent and Normal Rate  Volume:  Normal  Mood:  Depressed  Affect:  Congruent and Tearful  Thought Process:  Goal Directed, Linear and Descriptions of Associations: Intact  Orientation:  Full (Time, Place, and Person)  Thought Content:  Logical  Suicidal Thoughts:  Yes.  with intent/plan  Homicidal Thoughts:  No  Memory:  Immediate;   Good Recent;   Good Remote;   Good  Judgement:  Fair  Insight:  Fair  Psychomotor Activity:  Normal  Concentration:  Concentration: Good and Attention Span: Good  Recall:  Good  Fund of Knowledge:  Good  Language:  Good  Akathisia:  No  Handed:  Right  AIMS (if indicated):   N/A  Assets:  Communication Skills Desire for Improvement Resilience  ADL's:  Impaired  Cognition:  WNL  Sleep:   Poor    Assessment: George [name] is a 48 y.o. male who was admitted with hyperkalemia in the setting of poor medication compliance. He endorses worsening depression with SI. He is unable to safety plan. His mood symptoms appear to be clouding his judgement regarding his medical care. He also endorses poor sleep. Recommend Lexapro for mood and Trazodone for insomnia. He warrants inpatient psychiatric hospitalization for stabilization and treatment.     Treatment Plan Summary: -Patient warrants inpatient psychiatric hospitalization given high risk of harm to self. -Please provide 1:1 bedside sitter.  -Start Lexapro 10 mg daily for mood  and Trazodone 50 mg qhs PRN for insomnia.  -Please pursue involuntary commitment if patient refuses voluntary psychiatric hospitalization or attempts to leave the hospital. -Patient demonstrates capacity at this time to make medical decisions regarding his current treatment although it should be reevaluated on a regular basis since his mood symptoms have a potential to cloud his judgement.   -Recommend ordering TSH to rule out thyroid disease in the setting of depression.  -Will sign off on patient at this time. Please consult psychiatry again as needed.     Faythe Dingwall, DO 08/20/2018, 12:32 PM

## 2018-08-20 NOTE — Progress Notes (Signed)
Received bed side report of patient, pt stated refusal of Hemodialysis. Patient was Educated on importance of Hemodialysis, pt verbalize understanding and refusal.

## 2018-08-20 NOTE — Progress Notes (Addendum)
Pt's IV infiltrated at change of shift. IV removed. Pt refused new IV insertion upon IV team's entry into his room. Unable to give Solu-medrol tonight. Pt also refused Heparin subQ. Will continue to monitor.

## 2018-08-20 NOTE — Progress Notes (Signed)
Patient requested to speak with his doctor.   Patient stated that he wants to leave and verbalized dislike of current renal diet.  Listened to patients concerns and educated him on renal diet importance and benefits.pt verbalized understanding but stated that he wants to leave so that he can eat food that doesn't make him sick. Purohit MD paged.

## 2018-08-20 NOTE — Significant Event (Signed)
This Probation officer was contacted by psychiatry and felt that patient merited inpatient psychiatric evaluation.  While he was not actively suicidal he did have suicidal ideation.  A sitter was called.

## 2018-08-20 NOTE — Progress Notes (Signed)
[facility] Progress Note  Subjective: refused HD today, says he is depressed and not sure whether wants to live or not  Vitals:   08/19/18 2052 08/20/18 0520 08/20/18 0521 08/20/18 0806  BP:   (!) 153/85   Pulse:   81   Resp:   20   Temp:   98.3 F (36.8 C)   TempSrc:   Oral   SpO2: 93%  97% 96%  Weight:  99.3 kg    Height:        Inpatient medications: . amLODipine  10 mg Oral Daily  . calcium acetate  1,334 mg Oral TID WC  . carvedilol  25 mg Oral BID WC  . heparin  5,000 Units Subcutaneous Q8H  . ipratropium-albuterol  3 mL Nebulization BID  . isosorbide-hydrALAZINE  1 tablet Oral TID  . lanthanum  1,000 mg Oral TID WC  . levETIRAcetam  500 mg Oral BID  . multivitamin  1 tablet Oral QHS  . pravastatin  20 mg Oral QPM  . [START ON 08/21/2018] predniSONE  40 mg Oral Q breakfast  . sodium polystyrene  30 g Oral Once  . sodium zirconium cyclosilicate  10 g Oral Once    acetaminophen **OR** acetaminophen, ipratropium-albuterol, lidocaine-prilocaine, ondansetron **OR** ondansetron (ZOFRAN) IV  Iron/TIBC/Ferritin/ %Sat    Component Value Date/Time   IRON 145 06/16/2015 1352   TIBC 200 (L) 06/16/2015 1352   FERRITIN 846 (H) 06/16/2015 1352   IRONPCTSAT 72 (H) 06/16/2015 1352    Exam: Gen alert, no distress No jvd Chest cta bilat RRR no MRG Abd soft ntnd no mass or ascites +bs Ext 1+ edema LE's Neuro is alert, Ox 3 , nf  LUA AVF    Home meds:  - amlodipine 10/ carvedilol 25 bid/ isosorbide-hydralazine 20-37.5 bid  - pravastatin 20/ calc acetate 2 ac tid/ lanthanum 1gm ac  - levetiracetam 500 bid   Dialysis: [facility] MWF   4.5h    94.5kg  2/2.5 bath  14ga  400/800  Hep none  LUA AVF   Assessment/ Plan: 1. Vol excess/ pulm edema: resolved clinically. Still up 4-5kg. Refused HD today. Will reorder HD for tomorrow, get vol down to dry wt hopefully.  2. Homeless: Per HD staff at [facility] he has a transportation service arranged which  will pick him up anywhere. Have d/w patient. 3. Depression: per primary  4. ESRD on HD MWF. As above 5. HTN cont coreg/ amlod/ bidil 6. Seizure d/o 7. Anemia ckd - Hb 12 no need for esa 8. MBD ckd - cont meds      [facility] 08/20/2018, 10:37 AM  Recent Labs  Lab 08/18/18 0354 08/19/18 0805  NA 135 135  K 5.8* 4.7  CL 97* 97*  CO2 24 23  GLUCOSE 81 79  BUN 46* 28*  CREATININE 12.12* 8.47*  CALCIUM 8.3* 8.6*  PHOS  --  4.6  ALBUMIN  --  3.0*   No results for input(s): AST, ALT, ALKPHOS, BILITOT, PROT in the last 168 hours. Recent Labs  Lab 08/16/18 1156  WBC 5.5  HGB 12.6*  HCT 39.6  MCV 99.5  PLT 177

## 2018-08-20 NOTE — Progress Notes (Signed)
MD ordered 1:1 suicide sitter at 16:31. Suicide sitter not present in room at change of shift when this RN began night shift. NT pulled from own department staffing to sit with patient. RN started removing cords and potentially dangerous hospital equipment from patient's room with sitter present. After initially agreeing to the removal of his cell phone, pt became very agitated and threatened to leave the hospital when this RN explained the process of removing patient's personal belongings (backpack) and placing it at the nurses' station. Pt currently refusing search of his belongings. Psych note instructs to involuntarily commit patient if pt attempts to leave the hospital. [facility] called. On-call MD paged for orders. Security called. Due to not currently having involuntary commitment order and patient's agitation/refusal, security wanding and search on hold. Pt not currently suicidal per RN interview at beginning of shift with sitter present. Will await orders and continue to monitor.

## 2018-08-20 NOTE — Progress Notes (Signed)
PROGRESS NOTE    George [name]  YKZ:[id number] DOB: 02-03-1971 DOA: 08/16/2018 PCP: Shelby Mattocks, PA-C   Brief Narrative:  48 year old with past medical history relevant for ESRD on dialysis on M/W/F, noncompliant, hypertension, seizure disorder, hyperlipidemia who presents with acute hypoxic respiratory failure and hyperkalemia due to missing dialysis for social reasons.   Assessment & Plan:   Principal Problem:   MDD (major depressive disorder), single episode, moderate (HCC) Active Problems:   Hypoxia   Acute respiratory failure (HCC)   ESRD on peritoneal dialysis [facility])   Essential hypertension   Hyperkalemia   Acute pulmonary edema ([place])   Seizure ([place])  #) Psych: Patient was initially evaluated by psychiatry on 08/18/2018 for mood and depression.  He was pending discharge today when he reported that he no longer wanted dialysis and that he was ready to die.  He reports that he misses his granddaughter who is in [place] and that he cannot see her.  He understands that he will die if he does not get dialysis.  He then reports that he is not quite sure he is ready to die.  He denies any active SI or plan but does report passive SI.  He denies any HI.  He he apparently refused dialysis and is been refusing labs. - We will reconsult psychiatry to evaluate for capacity -If patient continues to refuse dialysis we will consult palliative care  #) Acute hypoxic respiratory failure: Wheezing is much improved and patient is now on room air -Continue IV methylprednisolone 40 mg every 8 hours start prednisone 40 mg daily - Every 6 hours scheduled bronchodilators  #) ESRD/hyperkalemia: Patient is status post serial dialysis with improvement in shortness of breath and pulmonary edema -Nephrology consulted, appreciate recommendations -Continue PhosLo with meals -Continue calcitriol Monday Tuesday Wednesday with dialysis  #) Hypertension/hyperlipidemia: -Continue amlodipine 10  mg daily -Continue carvedilol 25 mg twice daily -Continue isosorbide 20 mg 3 times daily -Continue hydralazine 37.5 mg 3 times daily - Continue pravastatin 20 mg nightly  #) Seizure disorder: -Continue levetiracetam 500 mg twice daily  Fluids: Restrict Electrolytes: Monitor and supplement Nutrition: Renal diet   Prophylaxis: Subcu heparin   Disposition: Pending evaluation of capacity and discussion of goals of care  Full code   Consultants:   Nephrology  Procedures:   None  Antimicrobials:   None none   Subjective: Please see above but apparently patient refused dialysis.  He endorses significant depressed mood due to various social and family factors.  He denies any active SI or HI but does have passive SI.  He denies any shortness of breath, chest pain, nausea, vomiting, diarrhea.  When discussed with the patient about the likelihood of death after stopping dialysis he understands and is ready to die.  Objective: Vitals:   08/19/18 2052 08/20/18 0520 08/20/18 0521 08/20/18 0806  BP:   (!) 153/85   Pulse:   81   Resp:   20   Temp:   98.3 F (36.8 C)   TempSrc:   Oral   SpO2: 93%  97% 96%  Weight:  99.3 kg    Height:       No intake or output data in the 24 hours ending 08/20/18 1021 Filed Weights   08/18/18 1130 08/19/18 0442 08/20/18 0520  Weight: 97.3 kg 97.6 kg 99.3 kg    Examination:  General exam: Appears calm and comfortable  Respiratory system: No increased work of breathing, diminished lung sounds at bases, no wheezes, crackles, rhonchi, Rales  Cardiovascular system: Regular rate and rhythm, 2 out of 6 systolic murmur that is transmitted from fistula Gastrointestinal system: Soft, nondistended, no rebound or guarding, plus bowel sounds Central nervous system: Alert and oriented.  Grossly intact, moving all extremities Extremities: No lower extremity edema, left upper extremity fistula with palpable thrill and audible bruit. Skin: No rashes over  visible skin Psychiatry: Judgment and insight are very poor.  Mood and affect is depressed.    Data Reviewed: I have personally reviewed following labs and imaging studies  CBC: Recent Labs  Lab 08/16/18 1156  WBC 5.5  HGB 12.6*  HCT 39.6  MCV 99.5  PLT 347   Basic Metabolic Panel: Recent Labs  Lab 08/16/18 1156 08/17/18 0442 08/18/18 0354 08/19/18 0805  NA 140 137 135 135  K 7.2* 4.4 5.8* 4.7  CL 102 98 97* 97*  CO2 21* 26 24 23   GLUCOSE 75 92 81 79  BUN 77* 35* 46* 28*  CREATININE 17.20* 10.38* 12.12* 8.47*  CALCIUM 9.0 8.2* 8.3* 8.6*  PHOS  --   --   --  4.6   GFR: Estimated Creatinine Clearance: 11.2 mL/min (A) (by C-G formula based on SCr of 8.47 mg/dL (H)). Liver Function Tests: Recent Labs  Lab 08/19/18 0805  ALBUMIN 3.0*   No results for input(s): LIPASE, AMYLASE in the last 168 hours. No results for input(s): AMMONIA in the last 168 hours. Coagulation Profile: No results for input(s): INR, PROTIME in the last 168 hours. Cardiac Enzymes: No results for input(s): CKTOTAL, CKMB, CKMBINDEX, TROPONINI in the last 168 hours. BNP (last 3 results) No results for input(s): PROBNP in the last 8760 hours. HbA1C: No results for input(s): HGBA1C in the last 72 hours. CBG: Recent Labs  Lab 08/19/18 1151 08/19/18 1610 08/19/18 2132 08/20/18 0634 08/20/18 0748  GLUCAP 77 117* 218* 130* 124*   Lipid Profile: No results for input(s): CHOL, HDL, LDLCALC, TRIG, CHOLHDL, LDLDIRECT in the last 72 hours. Thyroid Function Tests: No results for input(s): TSH, T4TOTAL, FREET4, T3FREE, THYROIDAB in the last 72 hours. Anemia Panel: No results for input(s): VITAMINB12, FOLATE, FERRITIN, TIBC, IRON, RETICCTPCT in the last 72 hours. Sepsis Labs: No results for input(s): PROCALCITON, LATICACIDVEN in the last 168 hours.  Recent Results (from the past 240 hour(s))  MRSA PCR Screening     Status: None   Collection Time: 08/11/18  1:42 AM  Result Value Ref Range Status     MRSA by PCR NEGATIVE NEGATIVE Final    Comment:        The GeneXpert MRSA Assay (FDA approved for NASAL specimens only), is one component of a comprehensive MRSA colonization surveillance program. It is not intended to diagnose MRSA infection nor to guide or monitor treatment for MRSA infections. Performed at [facility], [place] [address] [place], [place] [address]          Radiology Studies: No results found.      Scheduled Meds: . amLODipine  10 mg Oral Daily  . calcium acetate  1,334 mg Oral TID WC  . carvedilol  25 mg Oral BID WC  . heparin  5,000 Units Subcutaneous Q8H  . ipratropium-albuterol  3 mL Nebulization BID  . isosorbide-hydrALAZINE  1 tablet Oral TID  . lanthanum  1,000 mg Oral TID WC  . levETIRAcetam  500 mg Oral BID  . multivitamin  1 tablet Oral QHS  . pravastatin  20 mg Oral QPM  . [START ON 08/21/2018] predniSONE  40 mg  Oral Q breakfast  . sodium polystyrene  30 g Oral Once  . sodium zirconium cyclosilicate  10 g Oral Once   Continuous Infusions:   LOS: 4 days    Time spent: [place], MD [facility]   If 7PM-7AM, please contact night-coverage [email or web address] Password [facility] 08/20/2018, 10:21 AM

## 2018-08-21 DIAGNOSIS — J9601 Acute respiratory failure with hypoxia: Secondary | ICD-10-CM | POA: Diagnosis not present

## 2018-08-21 DIAGNOSIS — Z992 Dependence on renal dialysis: Secondary | ICD-10-CM | POA: Diagnosis not present

## 2018-08-21 DIAGNOSIS — E875 Hyperkalemia: Secondary | ICD-10-CM | POA: Diagnosis not present

## 2018-08-21 DIAGNOSIS — N2581 Secondary hyperparathyroidism of renal origin: Secondary | ICD-10-CM | POA: Diagnosis not present

## 2018-08-21 DIAGNOSIS — I132 Hypertensive heart and chronic kidney disease with heart failure and with stage 5 chronic kidney disease, or end stage renal disease: Secondary | ICD-10-CM | POA: Diagnosis not present

## 2018-08-21 DIAGNOSIS — E8770 Fluid overload, unspecified: Secondary | ICD-10-CM | POA: Diagnosis not present

## 2018-08-21 DIAGNOSIS — Z6841 Body Mass Index (BMI) 40.0 and over, adult: Secondary | ICD-10-CM | POA: Diagnosis not present

## 2018-08-21 DIAGNOSIS — D631 Anemia in chronic kidney disease: Secondary | ICD-10-CM | POA: Diagnosis not present

## 2018-08-21 DIAGNOSIS — I120 Hypertensive chronic kidney disease with stage 5 chronic kidney disease or end stage renal disease: Secondary | ICD-10-CM | POA: Diagnosis not present

## 2018-08-21 DIAGNOSIS — J810 Acute pulmonary edema: Secondary | ICD-10-CM | POA: Diagnosis not present

## 2018-08-21 DIAGNOSIS — N186 End stage renal disease: Secondary | ICD-10-CM | POA: Diagnosis not present

## 2018-08-21 DIAGNOSIS — R69 Illness, unspecified: Secondary | ICD-10-CM | POA: Diagnosis not present

## 2018-08-21 DIAGNOSIS — E1122 Type 2 diabetes mellitus with diabetic chronic kidney disease: Secondary | ICD-10-CM | POA: Diagnosis not present

## 2018-08-21 LAB — CBC
HCT: 29.7 % — ABNORMAL LOW (ref 39.0–52.0)
Hemoglobin: 10.2 g/dL — ABNORMAL LOW (ref 13.0–17.0)
MCH: 32.3 pg (ref 26.0–34.0)
MCHC: 34.3 g/dL (ref 30.0–36.0)
MCV: 94 fL (ref 80.0–100.0)
Platelets: 169 10*3/uL (ref 150–400)
RBC: 3.16 MIL/uL — ABNORMAL LOW (ref 4.22–5.81)
RDW: 14.9 % (ref 11.5–15.5)
WBC: 8.2 10*3/uL (ref 4.0–10.5)
nRBC: 0 % (ref 0.0–0.2)

## 2018-08-21 LAB — GLUCOSE, CAPILLARY
Glucose-Capillary: 99 mg/dL (ref 70–99)
Glucose-Capillary: 76 mg/dL (ref 70–99)
Glucose-Capillary: 97 mg/dL (ref 70–99)
Glucose-Capillary: 121 mg/dL — ABNORMAL HIGH (ref 70–99)
Glucose-Capillary: 186 mg/dL — ABNORMAL HIGH (ref 70–99)

## 2018-08-21 NOTE — Progress Notes (Signed)
Patient took night meds. No other complaints at this time. Will continue to monitor patient.

## 2018-08-21 NOTE — Progress Notes (Signed)
PROGRESS NOTE    George [name]  KGM:[id number] DOB: 1971-05-30 DOA: 08/16/2018 PCP: Shelby Mattocks, PA-C   Brief Narrative:  48 year old with past medical history relevant for ESRD on dialysis on M/W/F, noncompliant, hypertension, seizure disorder, hyperlipidemia who presents with acute hypoxic respiratory failure and hyperkalemia due to missing dialysis for social reasons.  He subsequently developed passive suicidal ideation and is currently pending inpatient psychiatry placement.   Assessment & Plan:   Principal Problem:   MDD (major depressive disorder), single episode, severe , no psychosis ([place]) Active Problems:   Hypoxia   Acute respiratory failure (HCC)   ESRD on peritoneal dialysis ([place])   Essential hypertension   Hyperkalemia   Acute pulmonary edema (HCC)   Seizure (HCC)   MDD (major depressive disorder), single episode, moderate ([place])  #) Psych: Patient continues to report passive suicidal ideation but no active plans.  He is voluntarily being committed to evaluate for inpatient psych. - Start escitalopram 10 mg daily per psych -Psychiatry following, appreciate recommendations -We will consult for inpatient psych placement -Sitter for passive suicidal ideation but not involuntarily committed, if patient tries to leave will need involuntary commitment papers  #) Acute hypoxic respiratory failure: Resolved with diuresis analysis and steroids -Continue oral prednisone 40 mg daily - Every 8 hours scheduled bronchodilators  #) ESRD/hyperkalemia: Patient is status post serial dialysis with improvement in shortness of breath and pulmonary edema -Nephrology consulted, appreciate recommendations -Continue PhosLo with meals -Continue calcitriol Monday Tuesday Wednesday with dialysis  #) Hypertension/hyperlipidemia: -Continue amlodipine 10 mg daily -Continue carvedilol 25 mg twice daily -Continue isosorbide 20 mg 3 times daily -Continue hydralazine 37.5 mg 3 times  daily - Continue pravastatin 20 mg nightly  #) Seizure disorder: -Continue levetiracetam 500 mg twice daily  Fluids: Restrict Electrolytes: Monitor and supplement Nutrition: Renal diet   Prophylaxis: Subcu heparin   Disposition: Pending discharge to inpatient psych  Full code   Consultants:   Nephrology  Psychiatry  Procedures:   None  Antimicrobials:   None none   Subjective: Patient reports that he is doing somewhat better.  His mood is still somewhat poor but he reports that he is glad to have a plan.  He denies any nausea, vomiting, diarrhea.  He continues to endorse passive suicidal ideation but denies any active plans.  He denies any homicidal ideation.  Objective: Vitals:   08/20/18 1911 08/20/18 2100 08/21/18 0541 08/21/18 0833  BP: 129/76  130/67   Pulse: 78  63   Resp: 18  18   Temp: 98 F (36.7 C)  98.2 F (36.8 C)   TempSrc: Oral  Oral   SpO2: 93% 95% 97% 98%  Weight:   96.2 kg   Height:        Intake/Output Summary (Last 24 hours) at 08/21/2018 6440 Last data filed at 08/21/2018 0900 Gross per 24 hour  Intake 360 ml  Output 4500 ml  Net -4140 ml   Filed Weights   08/20/18 1325 08/20/18 1735 08/21/18 0541  Weight: 100 kg 95.3 kg 96.2 kg    Examination:  General exam: Appears calm and comfortable  Respiratory system: No increased work of breathing, diminished lung sounds at bases, no wheezes, crackles, rhonchi, Rales Cardiovascular system: Regular rate and rhythm, 2 out of 6 systolic murmur that is transmitted from fistula Gastrointestinal system: Soft, nondistended, no rebound or guarding, plus bowel sounds Central nervous system: Alert and oriented.  Grossly intact, moving all extremities Extremities: No lower extremity edema, left upper extremity fistula  with palpable thrill and audible bruit. Skin: No rashes over visible skin Psychiatry: Judgment and insight are very poor.  Mood and affect is depressed.    Data Reviewed: I have  personally reviewed following labs and imaging studies  CBC: Recent Labs  Lab 08/16/18 1156 08/20/18 1339 08/21/18 0434  WBC 5.5 8.9 8.2  HGB 12.6* 10.4* 10.2*  HCT 39.6 30.0* 29.7*  MCV 99.5 95.2 94.0  PLT 177 153 616   Basic Metabolic Panel: Recent Labs  Lab 08/17/18 0442 08/18/18 0354 08/19/18 0805 08/20/18 1339 08/21/18 0434  NA 137 135 135 132* 134*  K 4.4 5.8* 4.7 4.8 4.1  CL 98 97* 97* 96* 96*  CO2 26 24 23 22 27   GLUCOSE 92 81 79 137* 90  BUN 35* 46* 28* 49* 25*  CREATININE 10.38* 12.12* 8.47* 11.05* 6.86*  CALCIUM 8.2* 8.3* 8.6* 9.1 8.3*  PHOS  --   --  4.6 4.4 4.7*   GFR: Estimated Creatinine Clearance: 13.6 mL/min (A) (by C-G formula based on SCr of 6.86 mg/dL (H)). Liver Function Tests: Recent Labs  Lab 08/19/18 0805 08/20/18 1339 08/21/18 0434  ALBUMIN 3.0* 3.2* 3.0*   No results for input(s): LIPASE, AMYLASE in the last 168 hours. No results for input(s): AMMONIA in the last 168 hours. Coagulation Profile: No results for input(s): INR, PROTIME in the last 168 hours. Cardiac Enzymes: No results for input(s): CKTOTAL, CKMB, CKMBINDEX, TROPONINI in the last 168 hours. BNP (last 3 results) No results for input(s): PROBNP in the last 8760 hours. HbA1C: No results for input(s): HGBA1C in the last 72 hours. CBG: Recent Labs  Lab 08/20/18 0748 08/20/18 1145 08/20/18 1808 08/20/18 2357 08/21/18 0738  GLUCAP 124* 213* 119* 99 76   Lipid Profile: No results for input(s): CHOL, HDL, LDLCALC, TRIG, CHOLHDL, LDLDIRECT in the last 72 hours. Thyroid Function Tests: No results for input(s): TSH, T4TOTAL, FREET4, T3FREE, THYROIDAB in the last 72 hours. Anemia Panel: No results for input(s): VITAMINB12, FOLATE, FERRITIN, TIBC, IRON, RETICCTPCT in the last 72 hours. Sepsis Labs: No results for input(s): PROCALCITON, LATICACIDVEN in the last 168 hours.  No results found for this or any previous visit (from the past 240 hour(s)).       Radiology  Studies: No results found.      Scheduled Meds: . amLODipine  10 mg Oral Daily  . calcium acetate  1,334 mg Oral TID WC  . carvedilol  25 mg Oral BID WC  . escitalopram  10 mg Oral Daily  . heparin  5,000 Units Subcutaneous Q8H  . ipratropium-albuterol  3 mL Nebulization BID  . isosorbide-hydrALAZINE  1 tablet Oral TID  . lanthanum  1,000 mg Oral TID WC  . levETIRAcetam  500 mg Oral BID  . multivitamin  1 tablet Oral QHS  . pravastatin  20 mg Oral QPM  . predniSONE  40 mg Oral Q breakfast  . sodium polystyrene  30 g Oral Once  . sodium zirconium cyclosilicate  10 g Oral Once   Continuous Infusions:   LOS: 5 days    Time spent: [place], MD [facility]   If 7PM-7AM, please contact night-coverage [email or web address] Password [facility] 08/21/2018, 9:27 AM

## 2018-08-21 NOTE — Progress Notes (Signed)
[facility] Progress Note  Subjective: agreed to HD in the afternoon yest, got 4 L off . Has a sitter now for SI and seen again by psych, SSRI started.   Vitals:   08/20/18 2100 08/21/18 0541 08/21/18 0833 08/21/18 1211  BP:  130/67  119/78  Pulse:  63  62  Resp:  18  18  Temp:  98.2 F (36.8 C)  98.3 F (36.8 C)  TempSrc:  Oral  Oral  SpO2: 95% 97% 98% 97%  Weight:  96.2 kg    Height:        Inpatient medications: . amLODipine  10 mg Oral Daily  . calcium acetate  1,334 mg Oral TID WC  . carvedilol  25 mg Oral BID WC  . escitalopram  10 mg Oral Daily  . heparin  5,000 Units Subcutaneous Q8H  . ipratropium-albuterol  3 mL Nebulization BID  . isosorbide-hydrALAZINE  1 tablet Oral TID  . lanthanum  1,000 mg Oral TID WC  . levETIRAcetam  500 mg Oral BID  . multivitamin  1 tablet Oral QHS  . pravastatin  20 mg Oral QPM  . predniSONE  40 mg Oral Q breakfast  . sodium polystyrene  30 g Oral Once  . sodium zirconium cyclosilicate  10 g Oral Once    acetaminophen **OR** acetaminophen, ipratropium-albuterol, lidocaine-prilocaine, ondansetron **OR** ondansetron (ZOFRAN) IV, traZODone  Iron/TIBC/Ferritin/ %Sat    Component Value Date/Time   IRON 145 06/16/2015 1352   TIBC 200 (L) 06/16/2015 1352   FERRITIN 846 (H) 06/16/2015 1352   IRONPCTSAT 72 (H) 06/16/2015 1352    Exam: Gen alert, no distress No jvd Chest cta bilat RRR no MRG Abd soft ntnd no mass or ascites +bs Ext 1+ edema LE's Neuro is alert, Ox 3 , nf  LUA AVF    Home meds:  - amlodipine 10/ carvedilol 25 bid/ isosorbide-hydralazine 20-37.5 bid  - pravastatin 20/ calc acetate 2 ac tid/ lanthanum 1gm ac  - levetiracetam 500 bid   Dialysis: [facility] MWF   4.5h    94.5kg  2/2.5 bath  14ga  400/800  Hep none  LUA AVF   Assessment/ Plan: 1. Vol excess/ pulm edema: resolved clinically. Close to dry wt.  2. ESRD on HD MWF. HD Monday.   3. Homeless: Per HD staff at [place] he has  a transportation service arranged which will pick him up anywhere if he lets them know the address ahead of time. Have d/w patient. 4. Depression: per primary  5. HTN cont coreg/ amlod/ bidil 6. Seizure d/o 7. Anemia ckd - Hb 12 no need for esa 8. MBD ckd - cont meds      [facility] 08/21/2018, 12:54 PM  Recent Labs  Lab 08/20/18 1339 08/21/18 0434  NA 132* 134*  K 4.8 4.1  CL 96* 96*  CO2 22 27  GLUCOSE 137* 90  BUN 49* 25*  CREATININE 11.05* 6.86*  CALCIUM 9.1 8.3*  PHOS 4.4 4.7*  ALBUMIN 3.2* 3.0*   No results for input(s): AST, ALT, ALKPHOS, BILITOT, PROT in the last 168 hours. Recent Labs  Lab 08/20/18 1339 08/21/18 0434  WBC 8.9 8.2  HGB 10.4* 10.2*  HCT 30.0* 29.7*  MCV 95.2 94.0  PLT 153 169

## 2018-08-21 NOTE — Progress Notes (Signed)
Patient refusing to take meds for tonight. Patient stated he was nauseous. RN offered zofran. Patient refused. States he doesn't feel like taking anything. Patient has keppra due. RN educated patient on the importance of taking his seizure medication. Xenia Blount,NP paged and notified. RN will attempt to give meds at a later time. Will continue to monitor.

## 2018-08-22 DIAGNOSIS — N2581 Secondary hyperparathyroidism of renal origin: Secondary | ICD-10-CM | POA: Diagnosis not present

## 2018-08-22 DIAGNOSIS — E875 Hyperkalemia: Secondary | ICD-10-CM | POA: Diagnosis not present

## 2018-08-22 DIAGNOSIS — N186 End stage renal disease: Secondary | ICD-10-CM | POA: Diagnosis not present

## 2018-08-22 DIAGNOSIS — E8770 Fluid overload, unspecified: Secondary | ICD-10-CM | POA: Diagnosis not present

## 2018-08-22 DIAGNOSIS — D631 Anemia in chronic kidney disease: Secondary | ICD-10-CM | POA: Diagnosis not present

## 2018-08-22 DIAGNOSIS — I120 Hypertensive chronic kidney disease with stage 5 chronic kidney disease or end stage renal disease: Secondary | ICD-10-CM | POA: Diagnosis not present

## 2018-08-22 DIAGNOSIS — J810 Acute pulmonary edema: Secondary | ICD-10-CM | POA: Diagnosis not present

## 2018-08-22 DIAGNOSIS — Z992 Dependence on renal dialysis: Secondary | ICD-10-CM | POA: Diagnosis not present

## 2018-08-22 LAB — RENAL FUNCTION PANEL
Albumin: 3 g/dL — ABNORMAL LOW (ref 3.5–5.0)
Albumin: 3 g/dL — ABNORMAL LOW (ref 3.5–5.0)
Anion gap: 11 (ref 5–15)
Anion gap: 13 (ref 5–15)
BUN: 25 mg/dL — ABNORMAL HIGH (ref 6–20)
BUN: 44 mg/dL — ABNORMAL HIGH (ref 6–20)
CO2: 27 mmol/L (ref 22–32)
CO2: 25 mmol/L (ref 22–32)
Calcium: 8.3 mg/dL — ABNORMAL LOW (ref 8.9–10.3)
Calcium: 8 mg/dL — ABNORMAL LOW (ref 8.9–10.3)
Chloride: 96 mmol/L — ABNORMAL LOW (ref 98–111)
Chloride: 94 mmol/L — ABNORMAL LOW (ref 98–111)
Creatinine, Ser: 6.86 mg/dL — ABNORMAL HIGH (ref 0.61–1.24)
Creatinine, Ser: 9.05 mg/dL — ABNORMAL HIGH (ref 0.61–1.24)
GFR calc Af Amer: 10 mL/min — ABNORMAL LOW (ref >60)
GFR calc Af Amer: 7 mL/min — ABNORMAL LOW (ref >60)
GFR calc non Af Amer: 9 mL/min — ABNORMAL LOW (ref >60)
GFR calc non Af Amer: 6 mL/min — ABNORMAL LOW (ref >60)
Glucose, Bld: 90 mg/dL (ref 70–99)
Glucose, Bld: 99 mg/dL (ref 70–99)
Phosphorus: 4.7 mg/dL — ABNORMAL HIGH (ref 2.5–4.6)
Phosphorus: 4.3 mg/dL (ref 2.5–4.6)
Potassium: 4.1 mmol/L (ref 3.5–5.1)
Potassium: 4.4 mmol/L (ref 3.5–5.1)
Sodium: 134 mmol/L — ABNORMAL LOW (ref 135–145)
Sodium: 132 mmol/L — ABNORMAL LOW (ref 135–145)

## 2018-08-22 LAB — GLUCOSE, CAPILLARY
GLUCOSE-CAPILLARY: 127 mg/dL — AB (ref 70–99)
Glucose-Capillary: 87 mg/dL (ref 70–99)
Glucose-Capillary: 130 mg/dL — ABNORMAL HIGH (ref 70–99)
Glucose-Capillary: 199 mg/dL — ABNORMAL HIGH (ref 70–99)

## 2018-08-22 MED ORDER — CHLORHEXIDINE GLUCONATE CLOTH 2 % EX PADS
6.0000 | MEDICATED_PAD | Freq: Every day | CUTANEOUS | Status: DC
  Administered 2018-08-25: 6 via TOPICAL

## 2018-08-22 NOTE — Progress Notes (Signed)
George [name] Progress Note  Subjective: Had HD last night.   Vitals:   08/21/18 1211 08/21/18 2029 08/22/18 0553 08/22/18 1209  BP: 119/78 118/63 140/88 137/81  Pulse: 62 66 67 66  Resp: 18 16 18 18   Temp: 98.3 F (36.8 C) 98.5 F (36.9 C) 98.9 F (37.2 C) 98.5 F (36.9 C)  TempSrc: Oral Oral Oral Oral  SpO2: 97% 95% 95% 93%  Weight:   98 kg   Height:        Inpatient medications: . amLODipine  10 mg Oral Daily  . calcium acetate  1,334 mg Oral TID WC  . carvedilol  25 mg Oral BID WC  . escitalopram  10 mg Oral Daily  . heparin  5,000 Units Subcutaneous Q8H  . isosorbide-hydrALAZINE  1 tablet Oral TID  . lanthanum  1,000 mg Oral TID WC  . levETIRAcetam  500 mg Oral BID  . multivitamin  1 tablet Oral QHS  . pravastatin  20 mg Oral QPM  . predniSONE  40 mg Oral Q breakfast  . sodium polystyrene  30 g Oral Once  . sodium zirconium cyclosilicate  10 g Oral Once    acetaminophen **OR** acetaminophen, ipratropium-albuterol, lidocaine-prilocaine, ondansetron **OR** ondansetron (ZOFRAN) IV, traZODone  Iron/TIBC/Ferritin/ %Sat    Component Value Date/Time   IRON 145 06/16/2015 1352   TIBC 200 (L) 06/16/2015 1352   FERRITIN 846 (H) 06/16/2015 1352   IRONPCTSAT 72 (H) 06/16/2015 1352    Exam: Gen alert, no distress No jvd Chest cta bilat RRR no MRG Abd soft ntnd no mass or ascites +bs Ext 1+ edema LE's Neuro is alert, Ox 3 , nf  LUA AVF    Home meds:  - amlodipine 10/ carvedilol 25 bid/ isosorbide-hydralazine 20-37.5 bid  - pravastatin 20/ calc acetate 2 ac tid/ lanthanum 1gm ac  - levetiracetam 500 bid    Dialysis: [facility] MWF   4.5h    94.5kg  2/2.5 bath  14ga  400/800  Hep none  LUA AVF   Assessment/ Plan: 1. Depression/ suicidal ideation: seen by psychiatry, is now awaiting inpatient psych admission.  2. ESRD on HD MWF. HD Monday.   3. Homeless: Per HD staff at [facility] he has a transportation service arranged which will pick  him up anywhere in town if he lets them know the address ahead of time. Have d/w patient. 4. HTN cont coreg/ amlod/ bidil 5. Seizure d/o 6. Anemia ckd - Hb 10, follow 7. MBD ckd - cont meds 8. Vol excess/ pulm edema: resolved      [facility] 08/22/2018, 2:00 PM  Recent Labs  Lab 08/21/18 0434 08/22/18 0515  NA 134* 132*  K 4.1 4.4  CL 96* 94*  CO2 27 25  GLUCOSE 90 99  BUN 25* 44*  CREATININE 6.86* 9.05*  CALCIUM 8.3* 8.0*  PHOS 4.7* 4.3  ALBUMIN 3.0* 3.0*   No results for input(s): AST, ALT, ALKPHOS, BILITOT, PROT in the last 168 hours. Recent Labs  Lab 08/20/18 1339 08/21/18 0434  WBC 8.9 8.2  HGB 10.4* 10.2*  HCT 30.0* 29.7*  MCV 95.2 94.0  PLT 153 169

## 2018-08-22 NOTE — Progress Notes (Signed)
PROGRESS NOTE    George [name]  WEX:[id number] DOB: [birth year] DOA: 08/16/2018 PCP: Shelby Mattocks, PA-C   Brief Narrative:  48 year old with past medical history relevant for ESRD on dialysis on M/W/F, noncompliant, hypertension, seizure disorder, hyperlipidemia who presents with acute hypoxic respiratory failure and hyperkalemia due to missing dialysis for social reasons.  He subsequently developed passive suicidal ideation and is currently pending inpatient psychiatry placement.   Assessment & Plan:   Principal Problem:   MDD (major depressive disorder), single episode, severe , no psychosis ([employer]) Active Problems:   Hypoxia   Acute respiratory failure (HCC)   ESRD on peritoneal dialysis ([place])   Essential hypertension   Hyperkalemia   Acute pulmonary edema (HCC)   Seizure (HCC)   MDD (major depressive disorder), single episode, moderate ([place])  #) Psych: Patient continues to report passive suicidal ideation but no active plans.  He is voluntarily being committed to evaluate for inpatient psych. - Continue escitalopram 10 mg daily per psych -Psychiatry following, appreciate recommendations -We will consult for inpatient psych placement -Sitter for passive suicidal ideation but not involuntarily committed, if patient tries to leave will need involuntary commitment papers  #) Acute hypoxic respiratory failure: Resolved with diuresis analysis and steroids -Continue oral prednisone 40 mg daily - Every 8 hours scheduled bronchodilators  #) ESRD/hyperkalemia: Patient is status post serial dialysis with improvement in shortness of breath and pulmonary edema -Nephrology consulted, appreciate recommendations -Continue PhosLo with meals -Continue calcitriol Monday Tuesday Wednesday with dialysis  #) Hypertension/hyperlipidemia: -Continue amlodipine 10 mg daily -Continue carvedilol 25 mg twice daily -Continue isosorbide 20 mg 3 times daily -Continue hydralazine 37.5 mg 3  times daily - Continue pravastatin 20 mg nightly  #) Seizure disorder: -Continue levetiracetam 500 mg twice daily  Fluids: Restrict Electrolytes: Monitor and supplement Nutrition: Renal diet   Prophylaxis: Subcu heparin   Disposition: Pending discharge to inpatient psych  Full code   Consultants:   Nephrology  Psychiatry  Procedures:   None  Antimicrobials:   None none   Subjective: Patient reports that he is doing somewhat better.  His mood is somewhat improved.  He continues to endorse some passive suicidal ideation.  He also endorses of hopelessness.  He denies any shortness of breath, chest pain, abdominal pain, nausea, vomiting, diarrhea, cough, congestion, rhinorrhea.  Objective: Vitals:   08/21/18 0833 08/21/18 1211 08/21/18 2029 08/22/18 0553  BP:  119/78 118/63 140/88  Pulse:  62 66 67  Resp:  18 16 18   Temp:  98.3 F (36.8 C) 98.5 F (36.9 C) 98.9 F (37.2 C)  TempSrc:  Oral Oral Oral  SpO2: 98% 97% 95% 95%  Weight:    98 kg  Height:        Intake/Output Summary (Last 24 hours) at 08/22/2018 0910 Last data filed at 08/22/2018 0129 Gross per 24 hour  Intake 596 ml  Output 35 ml  Net 561 ml   Filed Weights   08/20/18 1735 08/21/18 0541 08/22/18 0553  Weight: 95.3 kg 96.2 kg 98 kg    Examination:  General exam: Appears calm and comfortable  Respiratory system: No increased work of breathing, diminished lung sounds at bases, no wheezes, crackles, rhonchi, Rales Cardiovascular system: Regular rate and rhythm, 2 out of 6 systolic murmur that is transmitted from fistula Gastrointestinal system: Soft, nondistended, no rebound or guarding, plus bowel sounds Central nervous system: Alert and oriented.  Grossly intact, moving all extremities Extremities: No lower extremity edema, left upper extremity fistula with palpable thrill  and audible bruit. Skin: No rashes over visible skin Psychiatry: Judgment and insight are very poor.  Mood and affect is  depressed.    Data Reviewed: I have personally reviewed following labs and imaging studies  CBC: Recent Labs  Lab 08/16/18 1156 08/20/18 1339 08/21/18 0434  WBC 5.5 8.9 8.2  HGB 12.6* 10.4* 10.2*  HCT 39.6 30.0* 29.7*  MCV 99.5 95.2 94.0  PLT 177 153 425   Basic Metabolic Panel: Recent Labs  Lab 08/18/18 0354 08/19/18 0805 08/20/18 1339 08/21/18 0434 08/22/18 0515  NA 135 135 132* 134* 132*  K 5.8* 4.7 4.8 4.1 4.4  CL 97* 97* 96* 96* 94*  CO2 24 23 22 27 25   GLUCOSE 81 79 137* 90 99  BUN 46* 28* 49* 25* 44*  CREATININE 12.12* 8.47* 11.05* 6.86* 9.05*  CALCIUM 8.3* 8.6* 9.1 8.3* 8.0*  PHOS  --  4.6 4.4 4.7* 4.3   GFR: Estimated Creatinine Clearance: 10.4 mL/min (A) (by C-G formula based on SCr of 9.05 mg/dL (H)). Liver Function Tests: Recent Labs  Lab 08/19/18 0805 08/20/18 1339 08/21/18 0434 08/22/18 0515  ALBUMIN 3.0* 3.2* 3.0* 3.0*   No results for input(s): LIPASE, AMYLASE in the last 168 hours. No results for input(s): AMMONIA in the last 168 hours. Coagulation Profile: No results for input(s): INR, PROTIME in the last 168 hours. Cardiac Enzymes: No results for input(s): CKTOTAL, CKMB, CKMBINDEX, TROPONINI in the last 168 hours. BNP (last 3 results) No results for input(s): PROBNP in the last 8760 hours. HbA1C: No results for input(s): HGBA1C in the last 72 hours. CBG: Recent Labs  Lab 08/21/18 0738 08/21/18 1135 08/21/18 1632 08/21/18 2027 08/22/18 0720  GLUCAP 76 97 121* 186* 87   Lipid Profile: No results for input(s): CHOL, HDL, LDLCALC, TRIG, CHOLHDL, LDLDIRECT in the last 72 hours. Thyroid Function Tests: No results for input(s): TSH, T4TOTAL, FREET4, T3FREE, THYROIDAB in the last 72 hours. Anemia Panel: No results for input(s): VITAMINB12, FOLATE, FERRITIN, TIBC, IRON, RETICCTPCT in the last 72 hours. Sepsis Labs: No results for input(s): PROCALCITON, LATICACIDVEN in the last 168 hours.  No results found for this or any previous  visit (from the past 240 hour(s)).       Radiology Studies: No results found.      Scheduled Meds: . amLODipine  10 mg Oral Daily  . calcium acetate  1,334 mg Oral TID WC  . carvedilol  25 mg Oral BID WC  . escitalopram  10 mg Oral Daily  . heparin  5,000 Units Subcutaneous Q8H  . isosorbide-hydrALAZINE  1 tablet Oral TID  . lanthanum  1,000 mg Oral TID WC  . levETIRAcetam  500 mg Oral BID  . multivitamin  1 tablet Oral QHS  . pravastatin  20 mg Oral QPM  . predniSONE  40 mg Oral Q breakfast  . sodium polystyrene  30 g Oral Once  . sodium zirconium cyclosilicate  10 g Oral Once   Continuous Infusions:   LOS: 6 days    Time spent: [place], MD [facility]   If 7PM-7AM, please contact night-coverage [email or web address] Password [email or web address] 08/22/2018, 9:10 AM

## 2018-08-23 DIAGNOSIS — D631 Anemia in chronic kidney disease: Secondary | ICD-10-CM | POA: Diagnosis not present

## 2018-08-23 DIAGNOSIS — E875 Hyperkalemia: Secondary | ICD-10-CM | POA: Diagnosis not present

## 2018-08-23 DIAGNOSIS — N2581 Secondary hyperparathyroidism of renal origin: Secondary | ICD-10-CM | POA: Diagnosis not present

## 2018-08-23 DIAGNOSIS — I120 Hypertensive chronic kidney disease with stage 5 chronic kidney disease or end stage renal disease: Secondary | ICD-10-CM | POA: Diagnosis not present

## 2018-08-23 DIAGNOSIS — Z992 Dependence on renal dialysis: Secondary | ICD-10-CM | POA: Diagnosis not present

## 2018-08-23 DIAGNOSIS — N186 End stage renal disease: Secondary | ICD-10-CM | POA: Diagnosis not present

## 2018-08-23 DIAGNOSIS — E8770 Fluid overload, unspecified: Secondary | ICD-10-CM | POA: Diagnosis not present

## 2018-08-23 DIAGNOSIS — J810 Acute pulmonary edema: Secondary | ICD-10-CM | POA: Diagnosis not present

## 2018-08-23 LAB — RENAL FUNCTION PANEL
Albumin: 2.9 g/dL — ABNORMAL LOW (ref 3.5–5.0)
Anion gap: 14 (ref 5–15)
BUN: 61 mg/dL — ABNORMAL HIGH (ref 6–20)
CO2: 23 mmol/L (ref 22–32)
Calcium: 8 mg/dL — ABNORMAL LOW (ref 8.9–10.3)
Chloride: 94 mmol/L — ABNORMAL LOW (ref 98–111)
Creatinine, Ser: 10.73 mg/dL — ABNORMAL HIGH (ref 0.61–1.24)
GFR calc Af Amer: 6 mL/min — ABNORMAL LOW (ref >60)
GFR calc non Af Amer: 5 mL/min — ABNORMAL LOW (ref >60)
Glucose, Bld: 103 mg/dL — ABNORMAL HIGH (ref 70–99)
Phosphorus: 4.9 mg/dL — ABNORMAL HIGH (ref 2.5–4.6)
Potassium: 4.7 mmol/L (ref 3.5–5.1)
Sodium: 131 mmol/L — ABNORMAL LOW (ref 135–145)

## 2018-08-23 LAB — CBC
HCT: 30.5 % — ABNORMAL LOW (ref 39.0–52.0)
Hemoglobin: 10.3 g/dL — ABNORMAL LOW (ref 13.0–17.0)
MCH: 31.8 pg (ref 26.0–34.0)
MCHC: 33.8 g/dL (ref 30.0–36.0)
MCV: 94.1 fL (ref 80.0–100.0)
Platelets: 181 10*3/uL (ref 150–400)
RBC: 3.24 MIL/uL — ABNORMAL LOW (ref 4.22–5.81)
RDW: 14.7 % (ref 11.5–15.5)
WBC: 8.2 10*3/uL (ref 4.0–10.5)
nRBC: 0 % (ref 0.0–0.2)

## 2018-08-23 LAB — HEPATITIS B SURFACE ANTIGEN: HEP B S AG: NEGATIVE

## 2018-08-23 LAB — GLUCOSE, CAPILLARY
GLUCOSE-CAPILLARY: 91 mg/dL (ref 70–99)
Glucose-Capillary: 148 mg/dL — ABNORMAL HIGH (ref 70–99)
Glucose-Capillary: 141 mg/dL — ABNORMAL HIGH (ref 70–99)

## 2018-08-23 NOTE — Progress Notes (Signed)
PROGRESS NOTE    George [name]  HDQ:[id number] DOB: January 26, 1971 DOA: 08/16/2018 PCP: Shelby Mattocks, PA-C   Brief Narrative:  48 year old with past medical history relevant for ESRD on dialysis on M/W/F, noncompliant, hypertension, seizure disorder, hyperlipidemia who presents with acute hypoxic respiratory failure and hyperkalemia due to missing dialysis for social reasons.  He subsequently developed passive suicidal ideation and is currently pending inpatient psychiatry placement.   Assessment & Plan:   Principal Problem:   MDD (major depressive disorder), single episode, severe , no psychosis ([place]) Active Problems:   Hypoxia   Acute respiratory failure (HCC)   ESRD on peritoneal dialysis ([place])   Essential hypertension   Hyperkalemia   Acute pulmonary edema (HCC)   Seizure (HCC)   MDD (major depressive disorder), single episode, moderate ([place])  #) Psych: Patient continues to report passive suicidal ideation but no active plans.  He is voluntarily being committed to evaluate for inpatient psych. - Continue escitalopram 10 mg daily per psych -Psychiatry following, appreciate recommendations -Pending inpatient psych for consultation -Sitter for passive suicidal ideation but not involuntarily committed, if patient tries to leave will need involuntary commitment papers  #) Acute hypoxic respiratory failure: Resolved with diuresis analysis and steroids -Continue oral prednisone 40 mg daily - Every 8 hours scheduled bronchodilators  #) ESRD/hyperkalemia: Patient is status post serial dialysis with improvement in shortness of breath and pulmonary edema -Nephrology consulted, appreciate recommendations -Continue PhosLo with meals -Continue calcitriol Monday Tuesday Wednesday with dialysis  #) Hypertension/hyperlipidemia: -Continue amlodipine 10 mg daily -Continue carvedilol 25 mg twice daily -Continue isosorbide 20 mg 3 times daily -Continue hydralazine 37.5 mg 3 times  daily - Continue pravastatin 20 mg nightly  #) Seizure disorder: -Continue levetiracetam 500 mg twice daily  Fluids: Restrict Electrolytes: Monitor and supplement Nutrition: Renal diet   Prophylaxis: Subcu heparin   Disposition: Pending discharge to inpatient psych  Full code   Consultants:   Nephrology  Psychiatry  Procedures:   None  Antimicrobials:   None none   Subjective: Patient seen in dialysis this morning.  He denies any nausea, vomiting, diarrhea, cough, congestion, rhinorrhea.  He continues to report that his mood is getting better slowly.  Objective: Vitals:   08/23/18 0900 08/23/18 0930 08/23/18 1000 08/23/18 1030  BP: 129/74 122/70 132/68 135/76  Pulse: 61 61 62 60  Resp: 16 17 18 17   Temp:      TempSrc:      SpO2:      Weight:      Height:        Intake/Output Summary (Last 24 hours) at 08/23/2018 1100 Last data filed at 08/23/2018 1194 Gross per 24 hour  Intake 340 ml  Output -  Net 340 ml   Filed Weights   08/22/18 0553 08/23/18 0521 08/23/18 0700  Weight: 98 kg 99.7 kg 99.5 kg    Examination:  General exam: Appears calm and comfortable  Respiratory system: No increased work of breathing, diminished lung sounds at bases, no wheezes, crackles, rhonchi, Rales Cardiovascular system: Regular rate and rhythm, 2 out of 6 systolic murmur that is transmitted from fistula Gastrointestinal system: Soft, nondistended, no rebound or guarding, plus bowel sounds Central nervous system: Alert and oriented.  Grossly intact, moving all extremities Extremities: No lower extremity edema, left upper extremity fistula with palpable thrill and audible bruit. Skin: No rashes over visible skin Psychiatry: Judgment and insight are very poor.  Mood and affect is depressed.    Data Reviewed: I have personally reviewed  following labs and imaging studies  CBC: Recent Labs  Lab 08/16/18 1156 08/20/18 1339 08/21/18 0434 08/23/18 0836  WBC 5.5 8.9 8.2  8.2  HGB 12.6* 10.4* 10.2* 10.3*  HCT 39.6 30.0* 29.7* 30.5*  MCV 99.5 95.2 94.0 94.1  PLT 177 153 169 885   Basic Metabolic Panel: Recent Labs  Lab 08/19/18 0805 08/20/18 1339 08/21/18 0434 08/22/18 0515 08/23/18 0516  NA 135 132* 134* 132* 131*  K 4.7 4.8 4.1 4.4 4.7  CL 97* 96* 96* 94* 94*  CO2 23 22 27 25 23   GLUCOSE 79 137* 90 99 103*  BUN 28* 49* 25* 44* 61*  CREATININE 8.47* 11.05* 6.86* 9.05* 10.73*  CALCIUM 8.6* 9.1 8.3* 8.0* 8.0*  PHOS 4.6 4.4 4.7* 4.3 4.9*   GFR: Estimated Creatinine Clearance: 8.8 mL/min (A) (by C-G formula based on SCr of 10.73 mg/dL (H)). Liver Function Tests: Recent Labs  Lab 08/19/18 0805 08/20/18 1339 08/21/18 0434 08/22/18 0515 08/23/18 0516  ALBUMIN 3.0* 3.2* 3.0* 3.0* 2.9*   No results for input(s): LIPASE, AMYLASE in the last 168 hours. No results for input(s): AMMONIA in the last 168 hours. Coagulation Profile: No results for input(s): INR, PROTIME in the last 168 hours. Cardiac Enzymes: No results for input(s): CKTOTAL, CKMB, CKMBINDEX, TROPONINI in the last 168 hours. BNP (last 3 results) No results for input(s): PROBNP in the last 8760 hours. HbA1C: No results for input(s): HGBA1C in the last 72 hours. CBG: Recent Labs  Lab 08/21/18 2027 08/22/18 0720 08/22/18 1205 08/22/18 1607 08/22/18 2059  GLUCAP 186* 87 127* 130* 199*   Lipid Profile: No results for input(s): CHOL, HDL, LDLCALC, TRIG, CHOLHDL, LDLDIRECT in the last 72 hours. Thyroid Function Tests: No results for input(s): TSH, T4TOTAL, FREET4, T3FREE, THYROIDAB in the last 72 hours. Anemia Panel: No results for input(s): VITAMINB12, FOLATE, FERRITIN, TIBC, IRON, RETICCTPCT in the last 72 hours. Sepsis Labs: No results for input(s): PROCALCITON, LATICACIDVEN in the last 168 hours.  No results found for this or any previous visit (from the past 240 hour(s)).       Radiology Studies: No results found.      Scheduled Meds: . amLODipine  10 mg  Oral Daily  . calcium acetate  1,334 mg Oral TID WC  . carvedilol  25 mg Oral BID WC  . Chlorhexidine Gluconate Cloth  6 each Topical Q0600  . escitalopram  10 mg Oral Daily  . heparin  5,000 Units Subcutaneous Q8H  . isosorbide-hydrALAZINE  1 tablet Oral TID  . lanthanum  1,000 mg Oral TID WC  . levETIRAcetam  500 mg Oral BID  . multivitamin  1 tablet Oral QHS  . pravastatin  20 mg Oral QPM  . predniSONE  40 mg Oral Q breakfast   Continuous Infusions:   LOS: 7 days    Time spent: [place], MD [facility]   If 7PM-7AM, please contact night-coverage [email or web address] Password [email or web address] 08/23/2018, 11:00 AM

## 2018-08-23 NOTE — Clinical Social Work Note (Signed)
Clinical Social Work Assessment  Patient Details  Name: George [name] MRN: [id number] Date of Birth: 28-May-1971  Date of referral:  08/23/18               Reason for consult:  Facility Placement, Discharge Planning, Housing Concerns/Homelessness, Suicide Risk/Attempt, Mental Health Concerns                Permission sought to share information with:  Facility Art therapist granted to share information::  Yes, Verbal Permission Granted  Name::        Agency::  Inpatient psych facilities  Relationship::     Contact Information:     Housing/Transportation Living arrangements for the past 2 months:  Homeless Source of Information:  Patient, Medical Team Patient Interpreter Needed:  None Criminal Activity/Legal Involvement Pertinent to Current Situation/Hospitalization:  No - Comment as needed Significant Relationships:  Parents Lives with:  Self Do you feel safe going back to the place where you live?  Yes Need for family participation in patient care:  Yes (Comment)  Care giving concerns:  Homelessness. Inpatient psych admission.   Social Worker assessment / plan:  CSW met with patient. Sitter at bedside. CSW introduced role and explained that psych recommendations would be discussed. Patient agreeable to inpatient psych admission. Will start with [facility] since they are able to take HD patients. Spoke with admissions coordinator. She is requesting psych see patient again to make sure he still meet inpatient criteria. Last evaluation was Friday. MD is aware and will place consult. CSW also consulted for homelessness. This CSW gave patient shelter/community resources on 2/12. The social worker with [facility] is also working with patient on finding housing. No further concerns. CSW encouraged patient to contact CSW as needed. CSW will continue to follow patient for support and facilitate discharge to inpatient psych facility if still needed once bed  available.  Employment status:  Disabled (Comment on whether or not currently receiving Disability) Insurance information:  Managed Medicare PT Recommendations:  Not assessed at this time Information / Referral to community resources:  Inpatient Psychiatric Care (Comment Required)  Patient/Family's Response to care:  Patient agreeable to inpatient psych admission. Patient's mother supportive and involved in patient's care. Patient appreciated social work intervention.  Patient/Family's Understanding of and Emotional Response to Diagnosis, Current Treatment, and Prognosis:  Patient has a good understanding of the reason for admission and psych recommendations. Patient appears pleased with hospital care.  Emotional Assessment Appearance:  Appears stated age Attitude/Demeanor/Rapport:  Engaged, Gracious Affect (typically observed):  Accepting, Appropriate, Calm, Pleasant Orientation:  Oriented to Self, Oriented to Place, Oriented to  Time, Oriented to Situation Alcohol / Substance use:  Tobacco Use Psych involvement (Current and /or in the community):  Yes (Comment)  Discharge Needs  Concerns to be addressed:  Care Coordination Readmission within the last 30 days:  Yes Current discharge risk:  Psychiatric Illness, Homeless Barriers to Discharge:  Other(Psych re-consult.)   Candie Chroman, LCSW 08/23/2018, 1:33 PM

## 2018-08-23 NOTE — Procedures (Signed)
I was present at this dialysis session. I have reviewed the session itself and made appropriate changes.   Vital signs in last 24 hours:  Temp:  [98 F (36.7 C)-98.5 F (36.9 C)] 98.4 F (36.9 C) (02/24 0700) Pulse Rate:  [57-68] 57 (02/24 0717) Resp:  [12-21] 19 (02/24 0717) BP: (129-147)/(74-90) 144/82 (02/24 0717) SpO2:  [93 %-96 %] 95 % (02/24 0700) Weight:  [99.5 kg-99.7 kg] 99.5 kg (02/24 0700) Weight change: 1.678 kg Filed Weights   08/22/18 0553 08/23/18 0521 08/23/18 0700  Weight: 98 kg 99.7 kg 99.5 kg    Recent Labs  Lab 08/23/18 0516  NA 131*  K 4.7  CL 94*  CO2 23  GLUCOSE 103*  BUN 61*  CREATININE 10.73*  CALCIUM 8.0*  PHOS 4.9*    Recent Labs  Lab 08/16/18 1156 08/20/18 1339 08/21/18 0434  WBC 5.5 8.9 8.2  HGB 12.6* 10.4* 10.2*  HCT 39.6 30.0* 29.7*  MCV 99.5 95.2 94.0  PLT 177 153 169    Scheduled Meds: . amLODipine  10 mg Oral Daily  . calcium acetate  1,334 mg Oral TID WC  . carvedilol  25 mg Oral BID WC  . Chlorhexidine Gluconate Cloth  6 each Topical Q0600  . escitalopram  10 mg Oral Daily  . heparin  5,000 Units Subcutaneous Q8H  . isosorbide-hydrALAZINE  1 tablet Oral TID  . lanthanum  1,000 mg Oral TID WC  . levETIRAcetam  500 mg Oral BID  . multivitamin  1 tablet Oral QHS  . pravastatin  20 mg Oral QPM  . predniSONE  40 mg Oral Q breakfast   Continuous Infusions: PRN Meds:.acetaminophen **OR** acetaminophen, ipratropium-albuterol, ondansetron **OR** ondansetron (ZOFRAN) IV, traZODone     Dialysis:[facility] MWF 4.5h 94.5kg 2/2.5 bath 14ga 400/800 Hep none LUA AVF  Assessment/Plan 1. ESRD with pulmonary edema. Goal uf 5l bp 139/81. 2. Depression- passive suicidal ideation. Per Psych. 3. HTN- stable 4. Seizure disorder- per primary 5. Anemia of CKD- hgb drop from 12 to 10.2.  6. Homeless- will be an issue for disposition.  Donetta Potts,  MD 08/23/2018, 8:17 AM

## 2018-08-24 DIAGNOSIS — I120 Hypertensive chronic kidney disease with stage 5 chronic kidney disease or end stage renal disease: Secondary | ICD-10-CM | POA: Diagnosis not present

## 2018-08-24 DIAGNOSIS — N2581 Secondary hyperparathyroidism of renal origin: Secondary | ICD-10-CM | POA: Diagnosis not present

## 2018-08-24 DIAGNOSIS — J810 Acute pulmonary edema: Secondary | ICD-10-CM | POA: Diagnosis not present

## 2018-08-24 DIAGNOSIS — E875 Hyperkalemia: Secondary | ICD-10-CM | POA: Diagnosis not present

## 2018-08-24 DIAGNOSIS — N186 End stage renal disease: Secondary | ICD-10-CM | POA: Diagnosis not present

## 2018-08-24 DIAGNOSIS — D631 Anemia in chronic kidney disease: Secondary | ICD-10-CM | POA: Diagnosis not present

## 2018-08-24 DIAGNOSIS — F322 Major depressive disorder, single episode, severe without psychotic features: Secondary | ICD-10-CM | POA: Diagnosis not present

## 2018-08-24 DIAGNOSIS — E8770 Fluid overload, unspecified: Secondary | ICD-10-CM | POA: Diagnosis not present

## 2018-08-24 DIAGNOSIS — Z992 Dependence on renal dialysis: Secondary | ICD-10-CM | POA: Diagnosis not present

## 2018-08-24 LAB — BASIC METABOLIC PANEL
Anion gap: 9 (ref 5–15)
CO2: 27 mmol/L (ref 22–32)
Calcium: 8.3 mg/dL — ABNORMAL LOW (ref 8.9–10.3)
Chloride: 95 mmol/L — ABNORMAL LOW (ref 98–111)
Creatinine, Ser: 7.94 mg/dL — ABNORMAL HIGH (ref 0.61–1.24)
GFR calc Af Amer: 9 mL/min — ABNORMAL LOW (ref >60)
GFR calc non Af Amer: 7 mL/min — ABNORMAL LOW (ref >60)
Potassium: 4.3 mmol/L (ref 3.5–5.1)
Sodium: 131 mmol/L — ABNORMAL LOW (ref 135–145)

## 2018-08-24 LAB — BASIC METABOLIC PANEL WITH GFR
BUN: 41 mg/dL — ABNORMAL HIGH (ref 6–20)
Glucose, Bld: 124 mg/dL — ABNORMAL HIGH (ref 70–99)

## 2018-08-24 LAB — GLUCOSE, CAPILLARY
Glucose-Capillary: 148 mg/dL — ABNORMAL HIGH (ref 70–99)
Glucose-Capillary: 134 mg/dL — ABNORMAL HIGH (ref 70–99)
Glucose-Capillary: 140 mg/dL — ABNORMAL HIGH (ref 70–99)
Glucose-Capillary: 148 mg/dL — ABNORMAL HIGH (ref 70–99)

## 2018-08-24 NOTE — Progress Notes (Signed)
Patient ID: George [name], male   DOB: [birth year], 48 y.o.   MRN: [id number]   [facility] Progress Note    Subjective:   No events overnight and no complaints   Objective:   BP 127/82   Pulse (!) 57   Temp 98.5 F (36.9 C) (Oral)   Resp 18   Ht 5\' 2"  (1.575 m)   Wt 96.9 kg   SpO2 96%   BMI 39.07 kg/m   Intake/Output: I/O last 3 completed shifts: In: 1300 [P.O.:1300] Out: 4525 [Urine:25; Other:4500]   Intake/Output this shift:  Total I/O In: 480 [P.O.:480] Out: -  Weight change: -4.701 kg  Physical Exam: Gen: NAD CVS: no rub Resp: cta Abd: benign Ext: no edema, LAVF +T/B  Labs: BMET Recent Labs  Lab 08/18/18 0354 08/19/18 0805 08/20/18 1339 08/21/18 0434 08/22/18 0515 08/23/18 0516 08/24/18 0447  NA 135 135 132* 134* 132* 131* 131*  K 5.8* 4.7 4.8 4.1 4.4 4.7 4.3  CL 97* 97* 96* 96* 94* 94* 95*  CO2 24 23 22 27 25 23 27   GLUCOSE 81 79 137* 90 99 103* 124*  BUN 46* 28* 49* 25* 44* 61* 41*  CREATININE 12.12* 8.47* 11.05* 6.86* 9.05* 10.73* 7.94*  ALBUMIN  --  3.0* 3.2* 3.0* 3.0* 2.9*  --   CALCIUM 8.3* 8.6* 9.1 8.3* 8.0* 8.0* 8.3*  PHOS  --  4.6 4.4 4.7* 4.3 4.9*  --    CBC Recent Labs  Lab 08/20/18 1339 08/21/18 0434 08/23/18 0836  WBC 8.9 8.2 8.2  HGB 10.4* 10.2* 10.3*  HCT 30.0* 29.7* 30.5*  MCV 95.2 94.0 94.1  PLT 153 169 181    @IMGRELPRIORS @ Medications:    . amLODipine  10 mg Oral Daily  . calcium acetate  1,334 mg Oral TID WC  . carvedilol  25 mg Oral BID WC  . Chlorhexidine Gluconate Cloth  6 each Topical Q0600  . escitalopram  10 mg Oral Daily  . heparin  5,000 Units Subcutaneous Q8H  . isosorbide-hydrALAZINE  1 tablet Oral TID  . lanthanum  1,000 mg Oral TID WC  . levETIRAcetam  500 mg Oral BID  . multivitamin  1 tablet Oral QHS  . pravastatin  20 mg Oral QPM   Dialysis:[facility] MWF 4.5h 94.5kg 2/2.5 bath 14ga 400/800 Hep none LUA AVF  Assessment/ Plan:   1. Pulmonary edema- improved  with HD and UF 2. Depression/passive suicidal ideation.  Has sitter, plan per Psych and will likely require inpatient psych admission. 3. ESRD continue with HD q MWF for now 4. Anemia: stable 5. 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 Nutrition: renal diet 7. Hypertension: stable 8. Disposition- pt currently homeless.  Appreciated SW/CM assistance with placement and resources.   Donetta Potts, MD [place] Pager [phone] 08/24/2018, 12:54 PM

## 2018-08-24 NOTE — Progress Notes (Signed)
PROGRESS NOTE    George [name]  GDJ:[id number] DOB: April 08, 1971 DOA: 08/16/2018 PCP: Shelby Mattocks, PA-C   Brief Narrative:  48 year old with past medical history relevant for ESRD on dialysis on M/W/F, noncompliant, hypertension, seizure disorder, hyperlipidemia who presents with acute hypoxic respiratory failure and hyperkalemia due to missing dialysis for social reasons.  He subsequently developed passive suicidal ideation and is currently pending inpatient psychiatry placement.   Assessment & Plan:   Principal Problem:   MDD (major depressive disorder), single episode, severe , no psychosis ([place]) Active Problems:   Hypoxia   Acute respiratory failure (HCC)   ESRD on peritoneal dialysis ([place])   Essential hypertension   Hyperkalemia   Acute pulmonary edema (HCC)   Seizure (HCC)   MDD (major depressive disorder), single episode, moderate ([place])  #) Psych: Patient continues to report passive suicidal ideation but no active plans.  He is voluntarily being committed to evaluate for inpatient psych. - Continue escitalopram 10 mg daily per psych -Psychiatry following, appreciate recommendations, plan to reassess today to see if patient still needs inpatient psychiatry -Sitter for passive suicidal ideation but not involuntarily committed, if patient tries to leave will need involuntary commitment papers  #) Acute hypoxic respiratory failure: Resolved with diuresis analysis and steroids -Completed a 5-day course of steroids - Changed to PRN bronchodilators  #) ESRD/hyperkalemia: Patient is status post serial dialysis with improvement in shortness of breath and pulmonary edema -Nephrology consulted, appreciate recommendations -Continue PhosLo with meals -Continue calcitriol Monday Tuesday Wednesday with dialysis  #) Hypertension/hyperlipidemia: -Continue amlodipine 10 mg daily -Continue carvedilol 25 mg twice daily -Continue isosorbide 20 mg 3 times daily -Continue  hydralazine 37.5 mg 3 times daily - Continue pravastatin 20 mg nightly  #) Seizure disorder: -Continue levetiracetam 500 mg twice daily  Fluids: Restrict Electrolytes: Monitor and supplement Nutrition: Renal diet   Prophylaxis: Subcu heparin   Disposition: Pending likely discharge to inpatient psych  Full code   Consultants:   Nephrology  Psychiatry  Procedures:   None  Antimicrobials:   None none   Subjective: Patient is sleeping in bed this morning.  He reports his mood is somewhat improved.  He denies any nausea, vomiting, diarrhea, cough, congestion, rhinorrhea.  Objective: Vitals:   08/23/18 1142 08/23/18 1215 08/23/18 2049 08/24/18 0440  BP: 138/78 (!) 143/81 138/79 127/82  Pulse: 61 (!) 59 66 (!) 57  Resp: 18 15 17 18   Temp: 97.7 F (36.5 C) 97.9 F (36.6 C) 98.6 F (37 C) 98.5 F (36.9 C)  TempSrc: Oral Oral Oral Oral  SpO2: 95% 97% 97% 96%  Weight: 95 kg   96.9 kg  Height:        Intake/Output Summary (Last 24 hours) at 08/24/2018 0951 Last data filed at 08/24/2018 6222 Gross per 24 hour  Intake 1320 ml  Output 4525 ml  Net -3205 ml   Filed Weights   08/23/18 0700 08/23/18 1142 08/24/18 0440  Weight: 99.5 kg 95 kg 96.9 kg    Examination:  General exam: Appears calm and comfortable  Respiratory system: No increased work of breathing, diminished lung sounds at bases, no wheezes, crackles, rhonchi, Rales Cardiovascular system: Regular rate and rhythm, 2 out of 6 systolic murmur that is transmitted from fistula Gastrointestinal system: Soft, nondistended, no rebound or guarding, plus bowel sounds Central nervous system: Alert and oriented.  Grossly intact, moving all extremities Extremities: No lower extremity edema, left upper extremity fistula with palpable thrill and audible bruit. Skin: No rashes over visible skin  Psychiatry: Judgment and insight are very poor.  Mood and affect is depressed.    Data Reviewed: I have personally  reviewed following labs and imaging studies  CBC: Recent Labs  Lab 08/20/18 1339 08/21/18 0434 08/23/18 0836  WBC 8.9 8.2 8.2  HGB 10.4* 10.2* 10.3*  HCT 30.0* 29.7* 30.5*  MCV 95.2 94.0 94.1  PLT 153 169 468   Basic Metabolic Panel: Recent Labs  Lab 08/19/18 0805 08/20/18 1339 08/21/18 0434 08/22/18 0515 08/23/18 0516  NA 135 132* 134* 132* 131*  K 4.7 4.8 4.1 4.4 4.7  CL 97* 96* 96* 94* 94*  CO2 23 22 27 25 23   GLUCOSE 79 137* 90 99 103*  BUN 28* 49* 25* 44* 61*  CREATININE 8.47* 11.05* 6.86* 9.05* 10.73*  CALCIUM 8.6* 9.1 8.3* 8.0* 8.0*  PHOS 4.6 4.4 4.7* 4.3 4.9*   GFR: Estimated Creatinine Clearance: 8.7 mL/min (A) (by C-G formula based on SCr of 10.73 mg/dL (H)). Liver Function Tests: Recent Labs  Lab 08/19/18 0805 08/20/18 1339 08/21/18 0434 08/22/18 0515 08/23/18 0516  ALBUMIN 3.0* 3.2* 3.0* 3.0* 2.9*   No results for input(s): LIPASE, AMYLASE in the last 168 hours. No results for input(s): AMMONIA in the last 168 hours. Coagulation Profile: No results for input(s): INR, PROTIME in the last 168 hours. Cardiac Enzymes: No results for input(s): CKTOTAL, CKMB, CKMBINDEX, TROPONINI in the last 168 hours. BNP (last 3 results) No results for input(s): PROBNP in the last 8760 hours. HbA1C: No results for input(s): HGBA1C in the last 72 hours. CBG: Recent Labs  Lab 08/22/18 2059 08/23/18 1212 08/23/18 1623 08/23/18 2316 08/24/18 0628  GLUCAP 199* 91 148* 141* 148*   Lipid Profile: No results for input(s): CHOL, HDL, LDLCALC, TRIG, CHOLHDL, LDLDIRECT in the last 72 hours. Thyroid Function Tests: No results for input(s): TSH, T4TOTAL, FREET4, T3FREE, THYROIDAB in the last 72 hours. Anemia Panel: No results for input(s): VITAMINB12, FOLATE, FERRITIN, TIBC, IRON, RETICCTPCT in the last 72 hours. Sepsis Labs: No results for input(s): PROCALCITON, LATICACIDVEN in the last 168 hours.  No results found for this or any previous visit (from the past 240  hour(s)).       Radiology Studies: No results found.      Scheduled Meds: . amLODipine  10 mg Oral Daily  . calcium acetate  1,334 mg Oral TID WC  . carvedilol  25 mg Oral BID WC  . Chlorhexidine Gluconate Cloth  6 each Topical Q0600  . escitalopram  10 mg Oral Daily  . heparin  5,000 Units Subcutaneous Q8H  . isosorbide-hydrALAZINE  1 tablet Oral TID  . lanthanum  1,000 mg Oral TID WC  . levETIRAcetam  500 mg Oral BID  . multivitamin  1 tablet Oral QHS  . pravastatin  20 mg Oral QPM  . predniSONE  40 mg Oral Q breakfast   Continuous Infusions:   LOS: 8 days    Time spent: [place], MD [facility]   If 7PM-7AM, please contact night-coverage [email or web address] Password [facility] 08/24/2018, 9:51 AM

## 2018-08-24 NOTE — Consult Note (Signed)
[facility] Psych Consult Progress Note  08/24/2018 9:30 AM George [name]  MRN:  [id number] Subjective:   George [name] was last seen on 2/21 for capacity evaluation. He endorsed SI and was unable to safety plan so he was recommended for inpatient psychiatric hospitalization and Lexapro and Trazodone were started. He reports that his mood is better today. He denies SI, HI or AVH. He reports an improvement in his sleep with Trazodone. He feels safe for discharge although he is concerned about being homeless and would like shelter resources.    Principal Problem: MDD (major depressive disorder), single episode, severe , no psychosis ([place]) Diagnosis:  Principal Problem:   MDD (major depressive disorder), single episode, severe , no psychosis ([place]) Active Problems:   Hypoxia   Acute respiratory failure ([place])   ESRD on peritoneal dialysis ([place])   Essential hypertension   Hyperkalemia   Acute pulmonary edema (HCC)   Seizure ([place])   MDD (major depressive disorder), single episode, moderate ([place])  Total Time spent with patient: 15 minutes  Past Psychiatric History: MDD  Past Medical History:  Past Medical History:  Diagnosis Date  . CHF (congestive heart failure) ([place])   . Diabetes mellitus without complication ([place])   . Hypertension   . Renal disorder   . Seizure [facility])     Past Surgical History:  Procedure Laterality Date  . AV FISTULA PLACEMENT Left 06/19/2015   Procedure: LEFT ARTERIOVENOUS (AV) FISTULA CREATION;  Surgeon: Elam Dutch, MD;  Location: [place];  Service: Vascular;  Laterality: Left;  . CAPD REMOVAL N/A 06/22/2015   Procedure: CONTINUOUS AMBULATORY PERITONEAL DIALYSIS  (CAPD) CATHETER REMOVAL;  Surgeon: Ralene Ok, MD;  Location: [place];  Service: General;  Laterality: N/A;  . ESOPHAGOGASTRODUODENOSCOPY N/A 06/21/2015   Procedure: ESOPHAGOGASTRODUODENOSCOPY (EGD);  Surgeon: Laurence Spates, MD;  Location: [facility] ENDOSCOPY;  Service: Endoscopy;  Laterality: N/A;  . INSERTION OF  DIALYSIS CATHETER Left 06/19/2015   Procedure: INSERTION OF DIALYSIS CATHETER LEFT INTERNAL JUGULAR;  Surgeon: Elam Dutch, MD;  Location: [place];  Service: Vascular;  Laterality: Left;  . peritoneal dialysis catheter placed     around 2015 in [place]   Family History:  Family History  Problem Relation Age of Onset  . Sudden Cardiac Death Neg Hx    Family Psychiatric  History: Denies  Social History:  Social History   Substance and Sexual Activity  Alcohol Use No     Social History   Substance and Sexual Activity  Drug Use Yes  . Types: Marijuana    Social History   Socioeconomic History  . Marital status: Single    Spouse name: Not on file  . Number of children: Not on file  . Years of education: Not on file  . Highest education level: Not on file  Occupational History  . Not on file  Social Needs  . Financial resource strain: Very hard  . Food insecurity:    Worry: Often true    Inability: Often true  . Transportation needs:    Medical: Yes    Non-medical: Yes  Tobacco Use  . Smoking status: Former Smoker    Types: Cigarettes  . Smokeless tobacco: Never Used  Substance and Sexual Activity  . Alcohol use: No  . Drug use: Yes    Types: Marijuana  . Sexual activity: Not on file  Lifestyle  . Physical activity:    Days per week: Not on file    Minutes per session: Not on file  .  Stress: Not on file  Relationships  . Social connections:    Talks on phone: Not on file    Gets together: Not on file    Attends religious service: Not on file    Active member of club or organization: Not on file    Attends meetings of clubs or organizations: Not on file    Relationship status: Not on file  Other Topics Concern  . Not on file  Social History Narrative  . Not on file    Sleep: Good  Appetite:  Good  Current Medications: Current Facility-Administered Medications  Medication Dose Route Frequency Provider Last Rate Last Dose  . acetaminophen  (TYLENOL) tablet 650 mg  650 mg Oral Q6H PRN Arrien, Jimmy Picket, MD       Or  . acetaminophen (TYLENOL) suppository 650 mg  650 mg Rectal Q6H PRN Arrien, Jimmy Picket, MD      . amLODipine (NORVASC) tablet 10 mg  10 mg Oral Daily Arrien, Jimmy Picket, MD   10 mg at 08/23/18 1247  . calcium acetate (PHOSLO) capsule 1,334 mg  1,334 mg Oral TID WC Arrien, Jimmy Picket, MD   1,334 mg at 08/24/18 [phone]  . carvedilol (COREG) tablet 25 mg  25 mg Oral BID WC Arrien, Jimmy Picket, MD   25 mg at 08/24/18 0835  . Chlorhexidine Gluconate Cloth 2 % PADS 6 each  6 each Topical Q0600 Roney Jaffe, MD      . escitalopram (LEXAPRO) tablet 10 mg  10 mg Oral Daily Purohit, Shrey C, MD   10 mg at 08/23/18 1247  . heparin injection 5,000 Units  5,000 Units Subcutaneous Q8H Arrien, Jimmy Picket, MD   5,000 Units at 08/21/18 2045  . ipratropium-albuterol (DUONEB) 0.5-2.5 (3) MG/3ML nebulizer solution 3 mL  3 mL Nebulization Q6H PRN Purohit, Shrey C, MD      . isosorbide-hydrALAZINE (BIDIL) 20-37.5 MG per tablet 1 tablet  1 tablet Oral TID Arrien, Jimmy Picket, MD   1 tablet at 08/23/18 1902  . lanthanum (FOSRENOL) chewable tablet 1,000 mg  1,000 mg Oral TID WC Arrien, Jimmy Picket, MD   1,000 mg at 08/24/18 0835  . levETIRAcetam (KEPPRA) tablet 500 mg  500 mg Oral BID Tawni Millers, MD   500 mg at 08/23/18 2100  . multivitamin (RENA-VIT) tablet 1 tablet  1 tablet Oral QHS Arrien, Jimmy Picket, MD   1 tablet at 08/23/18 2100  . ondansetron (ZOFRAN) tablet 4 mg  4 mg Oral Q6H PRN Arrien, Jimmy Picket, MD       Or  . ondansetron [facility] injection 4 mg  4 mg Intravenous Q6H PRN Arrien, Jimmy Picket, MD      . pravastatin (PRAVACHOL) tablet 20 mg  20 mg Oral QPM Arrien, Jimmy Picket, MD   20 mg at 08/23/18 1903  . predniSONE (DELTASONE) tablet 40 mg  40 mg Oral Q breakfast Purohit, Shrey C, MD   40 mg at 08/24/18 0835  . traZODone (DESYREL) tablet 50 mg  50 mg Oral QHS PRN  Purohit, Konrad Dolores, MD   50 mg at 08/23/18 2100    Lab Results:  Results for orders placed or performed during the hospital encounter of 08/16/18 (from the past 48 hour(s))  Glucose, capillary     Status: Abnormal   Collection Time: 08/22/18 12:05 PM  Result Value Ref Range   Glucose-Capillary 127 (H) 70 - 99 mg/dL  Glucose, capillary     Status: Abnormal   Collection  Time: 08/22/18  4:07 PM  Result Value Ref Range   Glucose-Capillary 130 (H) 70 - 99 mg/dL  Glucose, capillary     Status: Abnormal   Collection Time: 08/22/18  8:59 PM  Result Value Ref Range   Glucose-Capillary 199 (H) 70 - 99 mg/dL   Comment 1 Notify RN   Renal function panel     Status: Abnormal   Collection Time: 08/23/18  5:16 AM  Result Value Ref Range   Sodium 131 (L) 135 - 145 mmol/L   Potassium 4.7 3.5 - 5.1 mmol/L   Chloride 94 (L) 98 - 111 mmol/L   CO2 23 22 - 32 mmol/L   Glucose, Bld 103 (H) 70 - 99 mg/dL   BUN 61 (H) 6 - 20 mg/dL   Creatinine, Ser 10.73 (H) 0.61 - 1.24 mg/dL   Calcium 8.0 (L) 8.9 - 10.3 mg/dL   Phosphorus 4.9 (H) 2.5 - 4.6 mg/dL   Albumin 2.9 (L) 3.5 - 5.0 g/dL   GFR calc non Af Amer 5 (L) >60 mL/min   GFR calc Af Amer 6 (L) >60 mL/min   Anion gap 14 5 - 15    Comment: Performed at [facility], [address], [place], [place] [address]  CBC     Status: Abnormal   Collection Time: 08/23/18  8:36 AM  Result Value Ref Range   WBC 8.2 4.0 - 10.5 K/uL   RBC 3.24 (L) 4.22 - 5.81 MIL/uL   Hemoglobin 10.3 (L) 13.0 - 17.0 g/dL   HCT 30.5 (L) 39.0 - 52.0 %   MCV 94.1 80.0 - 100.0 fL   MCH 31.8 26.0 - 34.0 pg   MCHC 33.8 30.0 - 36.0 g/dL   RDW 14.7 11.5 - 15.5 %   Platelets 181 150 - 400 K/uL   nRBC 0.0 0.0 - 0.2 %    Comment: Performed at [facility], [place] [address] [place], [place] [address]  Hepatitis B surface antigen     Status: None   Collection Time: 08/23/18 10:11 AM  Result Value Ref Range   Hepatitis B Surface Ag Negative Negative    Comment: (NOTE) A  courtesy copy of this report has been sent to [facility] [place] Performed At: [facility] [place], [place] [id number] Rush Farmer MD KX:[id number]   Glucose, capillary     Status: None   Collection Time: 08/23/18 12:12 PM  Result Value Ref Range   Glucose-Capillary 91 70 - 99 mg/dL  Glucose, capillary     Status: Abnormal   Collection Time: 08/23/18  4:23 PM  Result Value Ref Range   Glucose-Capillary 148 (H) 70 - 99 mg/dL  Glucose, capillary     Status: Abnormal   Collection Time: 08/23/18 11:16 PM  Result Value Ref Range   Glucose-Capillary 141 (H) 70 - 99 mg/dL  Glucose, capillary     Status: Abnormal   Collection Time: 08/24/18  6:28 AM  Result Value Ref Range   Glucose-Capillary 148 (H) 70 - 99 mg/dL    Blood Alcohol level:  No results found for: [facility]  Musculoskeletal: Strength & Muscle Tone: within normal limits Gait & Station: UTA since patient is lying in bed. Patient leans: N/A  Psychiatric Specialty Exam: Physical Exam  Nursing note and vitals reviewed. Constitutional: He is oriented to person, place, and time. He appears well-developed and well-nourished.  HENT:  Head: Normocephalic and atraumatic.  Neck: Normal range of motion.  Respiratory: Effort normal.  Musculoskeletal: Normal range of motion.  Neurological: He is alert and oriented to person, place, and time.  Psychiatric: His speech is normal and behavior is normal. Judgment normal. Cognition and memory are normal. He exhibits a depressed mood. He expresses no suicidal ideation.    Review of Systems  Cardiovascular: Negative for chest pain.  Gastrointestinal: Positive for abdominal pain and constipation. Negative for diarrhea, nausea and vomiting.  Psychiatric/Behavioral: Positive for depression. Negative for hallucinations and suicidal ideas. The patient does not have insomnia.   All other systems reviewed and are negative.   Blood pressure 127/82, pulse  (!) 57, temperature 98.5 F (36.9 C), temperature source Oral, resp. rate 18, height 5\' 2"  (1.575 m), weight 96.9 kg, SpO2 96 %.Body mass index is 39.07 kg/m.  General Appearance: Fairly Groomed, middle aged, African American male, wearing a hospital gown who is lying in bed on his left side. NAD.   Eye Contact:  Good  Speech:  Clear and Coherent and Normal Rate  Volume:  Normal  Mood:  "Better"  Affect:  Congruent  Thought Process:  Goal Directed, Linear and Descriptions of Associations: Intact  Orientation:  Full (Time, Place, and Person)  Thought Content:  Logical  Suicidal Thoughts:  No  Homicidal Thoughts:  No  Memory:  Immediate;   Good Recent;   Good Remote;   Good  Judgement:  Fair  Insight:  Fair  Psychomotor Activity:  Normal  Concentration:  Concentration: Good and Attention Span: Good  Recall:  Good  Fund of Knowledge:  Good  Language:  Good  Akathisia:  No  Handed:  Right  AIMS (if indicated):   N/A  Assets:  Communication Skills Desire for Improvement  ADL's:  Intact  Cognition:  WNL  Sleep:   Okay   Assessment:  George [name] is a 48 y.o. male who was admitted with hyperkalemia in the setting of poor medication compliance. He was last seen by psychiatry on 2/21 and endorsed SI in the setting of multiple psychosocial stressors. He was started on medications. He reports an improvement in his mood and sleep. He is able to safety plan and is agreeable to establishing care with an outpatient mental health provider. He no longer warrants inpatient psychiatric hospitalization.   Treatment Plan Summary: -Continue Lexapro 10 mg daily for mood and Trazodone 50 mg qhs PRN for insomnia.  -EKG reviewed and QTc 490 on 2/17. Please closely monitor when starting or increasing QTc prolonging agents.  -Please have SW provide patient with outpatient mental health resources for medication management and therapy.  -Ordered TSH to rule out thyroid disease in the setting of depression.   -Psychiatry will sign off on patient at this time. Please consult psychiatry again as needed.   Faythe Dingwall, DO 08/24/2018, 9:30 AM

## 2018-08-25 ENCOUNTER — Ambulatory Visit: Payer: Medicare HMO | Admitting: Internal Medicine

## 2018-08-25 DIAGNOSIS — E875 Hyperkalemia: Secondary | ICD-10-CM | POA: Diagnosis not present

## 2018-08-25 DIAGNOSIS — D631 Anemia in chronic kidney disease: Secondary | ICD-10-CM | POA: Diagnosis not present

## 2018-08-25 DIAGNOSIS — Z992 Dependence on renal dialysis: Secondary | ICD-10-CM | POA: Diagnosis not present

## 2018-08-25 DIAGNOSIS — E8770 Fluid overload, unspecified: Secondary | ICD-10-CM | POA: Diagnosis not present

## 2018-08-25 DIAGNOSIS — Z6841 Body Mass Index (BMI) 40.0 and over, adult: Secondary | ICD-10-CM | POA: Diagnosis not present

## 2018-08-25 DIAGNOSIS — I120 Hypertensive chronic kidney disease with stage 5 chronic kidney disease or end stage renal disease: Secondary | ICD-10-CM | POA: Diagnosis not present

## 2018-08-25 DIAGNOSIS — J9601 Acute respiratory failure with hypoxia: Secondary | ICD-10-CM | POA: Diagnosis not present

## 2018-08-25 DIAGNOSIS — N2581 Secondary hyperparathyroidism of renal origin: Secondary | ICD-10-CM | POA: Diagnosis not present

## 2018-08-25 DIAGNOSIS — F322 Major depressive disorder, single episode, severe without psychotic features: Secondary | ICD-10-CM | POA: Diagnosis not present

## 2018-08-25 DIAGNOSIS — N186 End stage renal disease: Secondary | ICD-10-CM | POA: Diagnosis not present

## 2018-08-25 DIAGNOSIS — I132 Hypertensive heart and chronic kidney disease with heart failure and with stage 5 chronic kidney disease, or end stage renal disease: Secondary | ICD-10-CM | POA: Diagnosis not present

## 2018-08-25 DIAGNOSIS — J810 Acute pulmonary edema: Secondary | ICD-10-CM | POA: Diagnosis not present

## 2018-08-25 DIAGNOSIS — R69 Illness, unspecified: Secondary | ICD-10-CM | POA: Diagnosis not present

## 2018-08-25 DIAGNOSIS — E1122 Type 2 diabetes mellitus with diabetic chronic kidney disease: Secondary | ICD-10-CM | POA: Diagnosis not present

## 2018-08-25 LAB — RENAL FUNCTION PANEL
ALBUMIN: 3.1 g/dL — AB (ref 3.5–5.0)
Albumin: 3 g/dL — ABNORMAL LOW (ref 3.5–5.0)
Anion gap: 13 (ref 5–15)
Anion gap: 14 (ref 5–15)
BUN: 64 mg/dL — ABNORMAL HIGH (ref 6–20)
BUN: 63 mg/dL — AB (ref 6–20)
CO2: 24 mmol/L (ref 22–32)
CO2: 24 mmol/L (ref 22–32)
CREATININE: 10.12 mg/dL — AB (ref 0.61–1.24)
Calcium: 8.1 mg/dL — ABNORMAL LOW (ref 8.9–10.3)
Calcium: 8.4 mg/dL — ABNORMAL LOW (ref 8.9–10.3)
Chloride: 94 mmol/L — ABNORMAL LOW (ref 98–111)
Chloride: 94 mmol/L — ABNORMAL LOW (ref 98–111)
Creatinine, Ser: 10.05 mg/dL — ABNORMAL HIGH (ref 0.61–1.24)
GFR calc Af Amer: 6 mL/min — ABNORMAL LOW (ref >60)
GFR calc Af Amer: 6 mL/min — ABNORMAL LOW (ref >60)
GFR calc non Af Amer: 6 mL/min — ABNORMAL LOW (ref >60)
GFR calc non Af Amer: 5 mL/min — ABNORMAL LOW (ref >60)
Glucose, Bld: 82 mg/dL (ref 70–99)
Glucose, Bld: 140 mg/dL — ABNORMAL HIGH (ref 70–99)
Phosphorus: 4.4 mg/dL (ref 2.5–4.6)
Phosphorus: 4.2 mg/dL (ref 2.5–4.6)
Potassium: 4.5 mmol/L (ref 3.5–5.1)
Potassium: 4.2 mmol/L (ref 3.5–5.1)
Sodium: 131 mmol/L — ABNORMAL LOW (ref 135–145)
Sodium: 132 mmol/L — ABNORMAL LOW (ref 135–145)

## 2018-08-25 LAB — CBC
HCT: 33.3 % — ABNORMAL LOW (ref 39.0–52.0)
Hemoglobin: 11.1 g/dL — ABNORMAL LOW (ref 13.0–17.0)
MCH: 31.5 pg (ref 26.0–34.0)
MCHC: 33.3 g/dL (ref 30.0–36.0)
MCV: 94.6 fL (ref 80.0–100.0)
Platelets: 206 10*3/uL (ref 150–400)
RBC: 3.52 MIL/uL — ABNORMAL LOW (ref 4.22–5.81)
RDW: 14.6 % (ref 11.5–15.5)
WBC: 8.5 10*3/uL (ref 4.0–10.5)
nRBC: 0 % (ref 0.0–0.2)

## 2018-08-25 LAB — TSH: TSH: 0.452 u[IU]/mL (ref 0.350–4.500)

## 2018-08-25 LAB — GLUCOSE, CAPILLARY
Glucose-Capillary: 76 mg/dL (ref 70–99)
Glucose-Capillary: 102 mg/dL — ABNORMAL HIGH (ref 70–99)
Glucose-Capillary: 79 mg/dL (ref 70–99)
Glucose-Capillary: 117 mg/dL — ABNORMAL HIGH (ref 70–99)
Glucose-Capillary: 88 mg/dL (ref 70–99)

## 2018-08-25 NOTE — Progress Notes (Signed)
PROGRESS NOTE    George [name]  DEY:[id number] DOB: 04/21/1971 DOA: 08/16/2018 PCP: Shelby Mattocks, PA-C     Brief Narrative:  George [name] is a 48 year old with past medical history relevant for ESRD on dialysis on M/W/F, noncompliant, hypertension, seizure disorder, hyperlipidemia who presents with acute hypoxic respiratory failure and hyperkalemia due to missing dialysis for social reasons.  He subsequently developed passive suicidal ideation; psych consulted. They initially recommended inpatient psych facility transfer, now cleared from psych standpoint.   New events last 24 hours / Subjective: No acute events overnight.  He underwent dialysis without issue today.  He was cleared by psychiatry yesterday.  Today, he denies any shortness of breath, chest pain, nausea or vomiting.  When asked where he will go when he is discharged, he states that he will go to [place] where he has family.  Unclear how he will get there, how he will continue dialysis.  Assessment & Plan:   Principal Problem:   MDD (major depressive disorder), single episode, severe , no psychosis ([place]) Active Problems:   Hypoxia   Acute respiratory failure (HCC)   ESRD on peritoneal dialysis ([place])   Essential hypertension   Hyperkalemia   Acute pulmonary edema (HCC)   Seizure (HCC)   MDD (major depressive disorder), single episode, moderate ([place])   Acute hypoxemic respiratory failure Resolved with dialysis, now on room air   ESRD with volume overload, pulmonary edema  Patient noted to be noncompliant Continue dialysis MWF  Suicidal ideation Psych consulted, initially was recommended for inpatient psych facility transfer Psych reevaluated patient on 2/25, patient is cleared at this time Continue Lexapro  Hyperlipidemia Continue pravastatin  Hypertension Continue amlodipine, carvedilol, hydralazine  CAD Continue bidil   Seizure disorder Continue Keppra   DVT prophylaxis: Subq hep Code  Status: Full Family Communication: No family at bedside Disposition Plan: Hopeful discharge after HD Friday    Consultants:   Nephrology  Psych  Procedures:   None   Antimicrobials:  Anti-infectives (From admission, onward)   None        Objective: Vitals:   08/25/18 1030 08/25/18 1100 08/25/18 1130 08/25/18 1154  BP: 136/81 132/74 (!) 136/92 (!) 160/100  Pulse: 63 62 66 66  Resp:    18  Temp:    97.6 F (36.4 C)  TempSrc:    Oral  SpO2:    98%  Weight:    96 kg  Height:        Intake/Output Summary (Last 24 hours) at 08/25/2018 1601 Last data filed at 08/25/2018 1154 Gross per 24 hour  Intake 600 ml  Output 3051 ml  Net -2451 ml   Filed Weights   08/25/18 0551 08/25/18 0745 08/25/18 1154  Weight: 98.9 kg 99.3 kg 96 kg    Examination:  General exam: Appears calm and comfortable  Respiratory system: Clear to auscultation. Respiratory effort normal. Cardiovascular system: S1 & S2 heard, RRR. No JVD, murmurs, rubs, gallops or clicks. No pedal edema. Gastrointestinal system: Abdomen is nondistended, soft and nontender. No organomegaly or masses felt. Normal bowel sounds heard. Central nervous system: Alert and oriented. No focal neurological deficits. Extremities: Symmetric 5 x 5 power. Skin: No rashes, lesions or ulcers Psychiatry: Judgement and insight appear normal. Mood & affect appropriate.   Data Reviewed: I have personally reviewed following labs and imaging studies  CBC: Recent Labs  Lab 08/20/18 1339 08/21/18 0434 08/23/18 0836 08/25/18 0538  WBC 8.9 8.2 8.2 8.5  HGB 10.4* 10.2* 10.3* 11.1*  HCT  30.0* 29.7* 30.5* 33.3*  MCV 95.2 94.0 94.1 94.6  PLT 153 169 181 174   Basic Metabolic Panel: Recent Labs  Lab 08/21/18 0434 08/22/18 0515 08/23/18 0516 08/24/18 0447 08/25/18 0538 08/25/18 0745  NA 134* 132* 131* 131* 131* 132*  K 4.1 4.4 4.7 4.3 4.5 4.2  CL 96* 94* 94* 95* 94* 94*  CO2 27 25 23 27 24 24   GLUCOSE 90 99 103* 124* 82  140*  BUN 25* 44* 61* 41* 64* 63*  CREATININE 6.86* 9.05* 10.73* 7.94* 10.05* 10.12*  CALCIUM 8.3* 8.0* 8.0* 8.3* 8.1* 8.4*  PHOS 4.7* 4.3 4.9*  --  4.4 4.2   GFR: Estimated Creatinine Clearance: 9.2 mL/min (A) (by C-G formula based on SCr of 10.12 mg/dL (H)). Liver Function Tests: Recent Labs  Lab 08/21/18 0434 08/22/18 0515 08/23/18 0516 08/25/18 0538 08/25/18 0745  ALBUMIN 3.0* 3.0* 2.9* 3.0* 3.1*   No results for input(s): LIPASE, AMYLASE in the last 168 hours. No results for input(s): AMMONIA in the last 168 hours. Coagulation Profile: No results for input(s): INR, PROTIME in the last 168 hours. Cardiac Enzymes: No results for input(s): CKTOTAL, CKMB, CKMBINDEX, TROPONINI in the last 168 hours. BNP (last 3 results) No results for input(s): PROBNP in the last 8760 hours. HbA1C: No results for input(s): HGBA1C in the last 72 hours. CBG: Recent Labs  Lab 08/24/18 1642 08/24/18 2058 08/25/18 0558 08/25/18 0654 08/25/18 1219  GLUCAP 140* 148* 76 102* 79   Lipid Profile: No results for input(s): CHOL, HDL, LDLCALC, TRIG, CHOLHDL, LDLDIRECT in the last 72 hours. Thyroid Function Tests: Recent Labs    08/25/18 0538  TSH 0.452   Anemia Panel: No results for input(s): VITAMINB12, FOLATE, FERRITIN, TIBC, IRON, RETICCTPCT in the last 72 hours. Sepsis Labs: No results for input(s): PROCALCITON, LATICACIDVEN in the last 168 hours.  No results found for this or any previous visit (from the past 240 hour(s)).     Radiology Studies: No results found.    Scheduled Meds: . amLODipine  10 mg Oral Daily  . calcium acetate  1,334 mg Oral TID WC  . carvedilol  25 mg Oral BID WC  . Chlorhexidine Gluconate Cloth  6 each Topical Q0600  . escitalopram  10 mg Oral Daily  . heparin  5,000 Units Subcutaneous Q8H  . isosorbide-hydrALAZINE  1 tablet Oral TID  . lanthanum  1,000 mg Oral TID WC  . levETIRAcetam  500 mg Oral BID  . multivitamin  1 tablet Oral QHS  .  pravastatin  20 mg Oral QPM   Continuous Infusions:   LOS: 9 days    Time spent: 35 minutes   Dessa Phi, DO [facility] [email or web address] 08/25/2018, 4:01 PM

## 2018-08-25 NOTE — Procedures (Signed)
I was present at this dialysis session. I have reviewed the session itself and made appropriate changes.  George [name] is 5kg above his edw, however will only attempt 4kg and follow. Hopefully will be able get to edw by Friday.   Vital signs in last 24 hours:  Temp:  [97.6 F (36.4 C)-98.5 F (36.9 C)] 97.6 F (36.4 C) (02/26 0745) Pulse Rate:  [57-65] 64 (02/26 0830) Resp:  [18-20] 18 (02/26 0745) BP: (124-156)/(70-96) 141/80 (02/26 0830) SpO2:  [96 %-98 %] 98 % (02/26 0745) Weight:  [98.9 kg-99.3 kg] 99.3 kg (02/26 0745) Weight change: 3.884 kg Filed Weights   08/24/18 0440 08/25/18 0551 08/25/18 0745  Weight: 96.9 kg 98.9 kg 99.3 kg    Recent Labs  Lab 08/25/18 0745  NA 132*  K 4.2  CL 94*  CO2 24  GLUCOSE 140*  BUN 63*  CREATININE 10.12*  CALCIUM 8.4*  PHOS 4.2    Recent Labs  Lab 08/21/18 0434 08/23/18 0836 08/25/18 0538  WBC 8.2 8.2 8.5  HGB 10.2* 10.3* 11.1*  HCT 29.7* 30.5* 33.3*  MCV 94.0 94.1 94.6  PLT 169 181 206    Scheduled Meds: . amLODipine  10 mg Oral Daily  . calcium acetate  1,334 mg Oral TID WC  . carvedilol  25 mg Oral BID WC  . Chlorhexidine Gluconate Cloth  6 each Topical Q0600  . escitalopram  10 mg Oral Daily  . heparin  5,000 Units Subcutaneous Q8H  . isosorbide-hydrALAZINE  1 tablet Oral TID  . lanthanum  1,000 mg Oral TID WC  . levETIRAcetam  500 mg Oral BID  . multivitamin  1 tablet Oral QHS  . pravastatin  20 mg Oral QPM   Continuous Infusions: PRN Meds:.acetaminophen **OR** acetaminophen, ipratropium-albuterol, ondansetron **OR** ondansetron (ZOFRAN) IV, traZODone   Donetta Potts,  MD 08/25/2018, 8:43 AM

## 2018-08-25 NOTE — Care Management Note (Signed)
Case Management Note  Patient Details  Name: George [name] MRN: [id number] Date of Birth: [birth year]  Subjective/Objective:   MDD                Action/Plan: Patient is independent of his ADL's; PCP: [name], Sandrea Matte ; has private insurance with [employer] with prescription drug coverage; ERSD, Hemodialysis in [place] and patient lives in [place]; he has transportation but sometimes they do not know where to pick him up for his dialysis treatment; CM following for progression of care.  Expected Discharge Date:       Possibly 08/27/2018           Expected Discharge Plan:  Home/Self Care  Discharge planning Services  CM Consult  Status of Service:  In process, will continue to follow  Sherrilyn Rist [phone] 08/25/2018, 1:46 PM

## 2018-08-26 DIAGNOSIS — N2581 Secondary hyperparathyroidism of renal origin: Secondary | ICD-10-CM | POA: Diagnosis not present

## 2018-08-26 DIAGNOSIS — D631 Anemia in chronic kidney disease: Secondary | ICD-10-CM | POA: Diagnosis not present

## 2018-08-26 DIAGNOSIS — Z992 Dependence on renal dialysis: Secondary | ICD-10-CM | POA: Diagnosis not present

## 2018-08-26 DIAGNOSIS — J9601 Acute respiratory failure with hypoxia: Secondary | ICD-10-CM | POA: Diagnosis not present

## 2018-08-26 DIAGNOSIS — I120 Hypertensive chronic kidney disease with stage 5 chronic kidney disease or end stage renal disease: Secondary | ICD-10-CM | POA: Diagnosis not present

## 2018-08-26 DIAGNOSIS — J810 Acute pulmonary edema: Secondary | ICD-10-CM | POA: Diagnosis not present

## 2018-08-26 DIAGNOSIS — Z6841 Body Mass Index (BMI) 40.0 and over, adult: Secondary | ICD-10-CM | POA: Diagnosis not present

## 2018-08-26 DIAGNOSIS — I132 Hypertensive heart and chronic kidney disease with heart failure and with stage 5 chronic kidney disease, or end stage renal disease: Secondary | ICD-10-CM | POA: Diagnosis not present

## 2018-08-26 DIAGNOSIS — E875 Hyperkalemia: Secondary | ICD-10-CM | POA: Diagnosis not present

## 2018-08-26 DIAGNOSIS — R69 Illness, unspecified: Secondary | ICD-10-CM | POA: Diagnosis not present

## 2018-08-26 DIAGNOSIS — N186 End stage renal disease: Secondary | ICD-10-CM | POA: Diagnosis not present

## 2018-08-26 DIAGNOSIS — E8770 Fluid overload, unspecified: Secondary | ICD-10-CM | POA: Diagnosis not present

## 2018-08-26 DIAGNOSIS — R6884 Jaw pain: Secondary | ICD-10-CM | POA: Diagnosis not present

## 2018-08-26 DIAGNOSIS — E1122 Type 2 diabetes mellitus with diabetic chronic kidney disease: Secondary | ICD-10-CM | POA: Diagnosis not present

## 2018-08-26 LAB — GLUCOSE, CAPILLARY
Glucose-Capillary: 76 mg/dL (ref 70–99)
Glucose-Capillary: 76 mg/dL (ref 70–99)
Glucose-Capillary: 112 mg/dL — ABNORMAL HIGH (ref 70–99)
Glucose-Capillary: 78 mg/dL (ref 70–99)

## 2018-08-26 LAB — BASIC METABOLIC PANEL
Anion gap: 9 (ref 5–15)
BUN: 40 mg/dL — ABNORMAL HIGH (ref 6–20)
CO2: 27 mmol/L (ref 22–32)
Calcium: 7.8 mg/dL — ABNORMAL LOW (ref 8.9–10.3)
Chloride: 96 mmol/L — ABNORMAL LOW (ref 98–111)
Creatinine, Ser: 7.4 mg/dL — ABNORMAL HIGH (ref 0.61–1.24)
GFR calc Af Amer: 9 mL/min — ABNORMAL LOW (ref >60)
GFR calc non Af Amer: 8 mL/min — ABNORMAL LOW (ref >60)
Glucose, Bld: 84 mg/dL (ref 70–99)
POTASSIUM: 4 mmol/L (ref 3.5–5.1)
Sodium: 132 mmol/L — ABNORMAL LOW (ref 135–145)

## 2018-08-26 NOTE — Progress Notes (Signed)
Patient ID: George [name], male   DOB: Nov 01, 1970, 48 y.o.   MRN: [id number] S: Feels well, no longer requiring a sitter and cleared by Psych O:BP 124/63   Pulse 61   Temp 98.8 F (37.1 C) (Oral)   Resp 18   Ht 5\' 2"  (1.575 m)   Wt 97.2 kg   SpO2 100%   BMI 39.20 kg/m   Intake/Output Summary (Last 24 hours) at 08/26/2018 1148 Last data filed at 08/26/2018 0830 Gross per 24 hour  Intake 1020 ml  Output 3053 ml  Net -2033 ml   Intake/Output: I/O last 3 completed shifts: In: 1080 [P.O.:1080] Out: 3052 [Other:3051; Stool:1]  Intake/Output this shift:  Total I/O In: 540 [P.O.:540] Out: 1 [Stool:1] Weight change: 0.416 kg Gen: NAD CVS: no rub Resp: cta Abd: +BS< soft, NT/ND Ext: no edema, LUE AVF +T/B  Recent Labs  Lab 08/20/18 1339 08/21/18 0434 08/22/18 0515 08/23/18 0516 08/24/18 0447 08/25/18 0538 08/25/18 0745 08/26/18 0440  NA 132* 134* 132* 131* 131* 131* 132* 132*  K 4.8 4.1 4.4 4.7 4.3 4.5 4.2 4.0  CL 96* 96* 94* 94* 95* 94* 94* 96*  CO2 22 27 25 23 27 24 24 27   GLUCOSE 137* 90 99 103* 124* 82 140* 84  BUN 49* 25* 44* 61* 41* 64* 63* 40*  CREATININE 11.05* 6.86* 9.05* 10.73* 7.94* 10.05* 10.12* 7.40*  ALBUMIN 3.2* 3.0* 3.0* 2.9*  --  3.0* 3.1*  --   CALCIUM 9.1 8.3* 8.0* 8.0* 8.3* 8.1* 8.4* 7.8*  PHOS 4.4 4.7* 4.3 4.9*  --  4.4 4.2  --    Liver Function Tests: Recent Labs  Lab 08/23/18 0516 08/25/18 0538 08/25/18 0745  ALBUMIN 2.9* 3.0* 3.1*   No results for input(s): LIPASE, AMYLASE in the last 168 hours. No results for input(s): AMMONIA in the last 168 hours. CBC: Recent Labs  Lab 08/20/18 1339 08/21/18 0434 08/23/18 0836 08/25/18 0538  WBC 8.9 8.2 8.2 8.5  HGB 10.4* 10.2* 10.3* 11.1*  HCT 30.0* 29.7* 30.5* 33.3*  MCV 95.2 94.0 94.1 94.6  PLT 153 169 181 206   Cardiac Enzymes: No results for input(s): CKTOTAL, CKMB, CKMBINDEX, TROPONINI in the last 168 hours. CBG: Recent Labs  Lab 08/25/18 1219 08/25/18 1722 08/25/18 2114  08/26/18 0635 08/26/18 1137  GLUCAP 79 117* 88 76 76    Iron Studies: No results for input(s): IRON, TIBC, TRANSFERRIN, FERRITIN in the last 72 hours. Studies/Results: No results found. [place] Kitchen amLODipine  10 mg Oral Daily  . calcium acetate  1,334 mg Oral TID WC  . carvedilol  25 mg Oral BID WC  . Chlorhexidine Gluconate Cloth  6 each Topical Q0600  . escitalopram  10 mg Oral Daily  . heparin  5,000 Units Subcutaneous Q8H  . isosorbide-hydrALAZINE  1 tablet Oral TID  . lanthanum  1,000 mg Oral TID WC  . levETIRAcetam  500 mg Oral BID  . multivitamin  1 tablet Oral QHS  . pravastatin  20 mg Oral QPM    BMET    Component Value Date/Time   NA 132 (L) 08/26/2018 0440   K 4.0 08/26/2018 0440   CL 96 (L) 08/26/2018 0440   CO2 27 08/26/2018 0440   GLUCOSE 84 08/26/2018 0440   BUN 40 (H) 08/26/2018 0440   CREATININE 7.40 (H) 08/26/2018 0440   CALCIUM 7.8 (L) 08/26/2018 0440   CALCIUM 7.7 (L) 06/19/2015 1600   GFRNONAA 8 (L) 08/26/2018 0440   GFRAA 9 (L) 08/26/2018  0440   CBC    Component Value Date/Time   WBC 8.5 08/25/2018 0538   RBC 3.52 (L) 08/25/2018 0538   HGB 11.1 (L) 08/25/2018 0538   HCT 33.3 (L) 08/25/2018 0538   PLT 206 08/25/2018 0538   MCV 94.6 08/25/2018 0538   MCH 31.5 08/25/2018 0538   MCHC 33.3 08/25/2018 0538   RDW 14.6 08/25/2018 0538   LYMPHSABS 0.9 11/21/2017 1926   MONOABS 0.6 11/21/2017 1926   EOSABS 0.3 11/21/2017 1926   BASOSABS 0.1 11/21/2017 1926     Dialysis:[facility] MWF 4.5h 94.5kg 2/2.5 bath 14ga 400/800 Hep none LUA AVF  Assessment/ Plan:   1. Pulmonary edema- improved with HD and UF 2. Depression/passive suicidal ideation.  No longer has sitter, and plan initially for inpatient psych has been changed as he has responded to lexapro and trazadone.   3. ESRD continue with HD q MWF for now 4. Anemia: stable 5. 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 Nutrition: renal diet 7. Hypertension:  stable 8. Disposition- pt currently homeless.  Appreciated SW/CM assistance with placement and resources.  Since he no longer requires inpatient Psych hospitalization, he will need shelter resources prior to discharge.  He also reports a desire to return to [place], but will need to have his SW at his home HD unit assist with placement there.  Plan for now is to return to his home HD unit once shelter arrangements can be made   Donetta Potts, MD [employer] [phone]

## 2018-08-26 NOTE — Progress Notes (Signed)
SW intern met with patient at bedside. Patient just woke up from a nap and was not interactive and was quite, but stated would like resources.   Intern provided patient with outpatient resources for mental health counseling, shelter resources, and [employer]. Patient said he did not have any family or friends to stay with, and will review shelter list.   Arlis Porta, Social Work Intern

## 2018-08-26 NOTE — Progress Notes (Signed)
PROGRESS NOTE    [name]  HQI:[id number] DOB: 07-15-1970 DOA: 08/16/2018 PCP: Shelby Mattocks, PA-C     Brief Narrative:  George [name] is a 48 year old with past medical history relevant for ESRD on dialysis on M/W/F, noncompliant, hypertension, seizure disorder, hyperlipidemia who presents with acute hypoxic respiratory failure and hyperkalemia due to missing dialysis for social reasons.  He subsequently developed passive suicidal ideation; psych consulted. They initially recommended inpatient psych facility transfer, now cleared from psych standpoint.   New events last 24 hours / Subjective: Admits to some chest congestion, no chest pain, peripheral edema, nausea, vomiting, abdominal pain. States that next Wednesday when he gets money, he will find transportation to go back to [place]. I discussed with him the importance of continuing HD and that currently he does not have HD chair/appt in [place].   Assessment & Plan:   Principal Problem:   Acute pulmonary edema (HCC) Active Problems:   Hypoxia   Acute respiratory failure (HCC)   ESRD on peritoneal dialysis ([place])   Essential hypertension   Hyperkalemia   Seizure (HCC)   MDD (major depressive disorder), single episode, moderate (HCC)   MDD (major depressive disorder), single episode, severe , no psychosis ([place])   Acute hypoxemic respiratory failure Resolved with dialysis, now on room air   ESRD with volume overload, pulmonary edema  Patient noted to be noncompliant Continue dialysis MWF, plan for tmrw   Suicidal ideation Psych consulted, initially was recommended for inpatient psych facility transfer Psych reevaluated patient on 2/25, patient is cleared at this time Continue Lexapro  Hyperlipidemia Continue pravastatin  Hypertension Continue amlodipine, carvedilol, hydralazine  CAD Continue bidil   Seizure disorder Continue Keppra   DVT prophylaxis: Subq hep Code Status: Full Family  Communication: No family at bedside Disposition Plan: Hopeful discharge after HD Friday    Consultants:   Nephrology  Psych  Procedures:   None   Antimicrobials:  Anti-infectives (From admission, onward)   None       Objective: Vitals:   08/26/18 0451 08/26/18 0454 08/26/18 0750 08/26/18 1001  BP:  130/78 139/86 124/63  Pulse:  (!) 59 61   Resp:  18    Temp:  98.8 F (37.1 C)    TempSrc:  Oral    SpO2:  100%    Weight: 97.2 kg     Height:        Intake/Output Summary (Last 24 hours) at 08/26/2018 1044 Last data filed at 08/26/2018 0830 Gross per 24 hour  Intake 1020 ml  Output 3053 ml  Net -2033 ml   Filed Weights   08/25/18 0745 08/25/18 1154 08/26/18 0451  Weight: 99.3 kg 96 kg 97.2 kg    Examination: General exam: Appears calm and comfortable  Respiratory system: Clear to auscultation, slightly diminished bases. Respiratory effort normal. Cardiovascular system: S1 & S2 heard, RRR. No JVD, murmurs, rubs, gallops or clicks. No pedal edema. Gastrointestinal system: Abdomen is nondistended, soft and nontender. No organomegaly or masses felt. Normal bowel sounds heard. Central nervous system: Alert and oriented. No focal neurological deficits. Extremities: Symmetric 5 x 5 power. Skin: No rashes, lesions or ulcers Psychiatry: Judgement and insight appear poor    Data Reviewed: I have personally reviewed following labs and imaging studies  CBC: Recent Labs  Lab 08/20/18 1339 08/21/18 0434 08/23/18 0836 08/25/18 0538  WBC 8.9 8.2 8.2 8.5  HGB 10.4* 10.2* 10.3* 11.1*  HCT 30.0* 29.7* 30.5* 33.3*  MCV 95.2 94.0 94.1 94.6  PLT 153  169 181 329   Basic Metabolic Panel: Recent Labs  Lab 08/21/18 0434 08/22/18 0515 08/23/18 0516 08/24/18 0447 08/25/18 0538 08/25/18 0745 08/26/18 0440  NA 134* 132* 131* 131* 131* 132* 132*  K 4.1 4.4 4.7 4.3 4.5 4.2 4.0  CL 96* 94* 94* 95* 94* 94* 96*  CO2 27 25 23 27 24 24 27   GLUCOSE 90 99 103* 124* 82 140* 84   BUN 25* 44* 61* 41* 64* 63* 40*  CREATININE 6.86* 9.05* 10.73* 7.94* 10.05* 10.12* 7.40*  CALCIUM 8.3* 8.0* 8.0* 8.3* 8.1* 8.4* 7.8*  PHOS 4.7* 4.3 4.9*  --  4.4 4.2  --    GFR: Estimated Creatinine Clearance: 12.6 mL/min (A) (by C-G formula based on SCr of 7.4 mg/dL (H)). Liver Function Tests: Recent Labs  Lab 08/21/18 0434 08/22/18 0515 08/23/18 0516 08/25/18 0538 08/25/18 0745  ALBUMIN 3.0* 3.0* 2.9* 3.0* 3.1*   No results for input(s): LIPASE, AMYLASE in the last 168 hours. No results for input(s): AMMONIA in the last 168 hours. Coagulation Profile: No results for input(s): INR, PROTIME in the last 168 hours. Cardiac Enzymes: No results for input(s): CKTOTAL, CKMB, CKMBINDEX, TROPONINI in the last 168 hours. BNP (last 3 results) No results for input(s): PROBNP in the last 8760 hours. HbA1C: No results for input(s): HGBA1C in the last 72 hours. CBG: Recent Labs  Lab 08/25/18 0654 08/25/18 1219 08/25/18 1722 08/25/18 2114 08/26/18 0635  GLUCAP 102* 79 117* 88 76   Lipid Profile: No results for input(s): CHOL, HDL, LDLCALC, TRIG, CHOLHDL, LDLDIRECT in the last 72 hours. Thyroid Function Tests: Recent Labs    08/25/18 0538  TSH 0.452   Anemia Panel: No results for input(s): VITAMINB12, FOLATE, FERRITIN, TIBC, IRON, RETICCTPCT in the last 72 hours. Sepsis Labs: No results for input(s): PROCALCITON, LATICACIDVEN in the last 168 hours.  No results found for this or any previous visit (from the past 240 hour(s)).     Radiology Studies: No results found.    Scheduled Meds: . amLODipine  10 mg Oral Daily  . calcium acetate  1,334 mg Oral TID WC  . carvedilol  25 mg Oral BID WC  . Chlorhexidine Gluconate Cloth  6 each Topical Q0600  . escitalopram  10 mg Oral Daily  . heparin  5,000 Units Subcutaneous Q8H  . isosorbide-hydrALAZINE  1 tablet Oral TID  . lanthanum  1,000 mg Oral TID WC  . levETIRAcetam  500 mg Oral BID  . multivitamin  1 tablet Oral QHS    . pravastatin  20 mg Oral QPM   Continuous Infusions:   LOS: 10 days    Time spent: 25 minutes   Dessa Phi, DO [facility] [email or web address] 08/26/2018, 10:44 AM

## 2018-08-27 ENCOUNTER — Encounter: Payer: Self-pay | Admitting: Internal Medicine

## 2018-08-27 ENCOUNTER — Inpatient Hospital Stay (HOSPITAL_COMMUNITY): Payer: Medicare HMO

## 2018-08-27 DIAGNOSIS — D631 Anemia in chronic kidney disease: Secondary | ICD-10-CM | POA: Diagnosis not present

## 2018-08-27 DIAGNOSIS — N186 End stage renal disease: Secondary | ICD-10-CM | POA: Diagnosis not present

## 2018-08-27 DIAGNOSIS — I120 Hypertensive chronic kidney disease with stage 5 chronic kidney disease or end stage renal disease: Secondary | ICD-10-CM | POA: Diagnosis not present

## 2018-08-27 DIAGNOSIS — E875 Hyperkalemia: Secondary | ICD-10-CM | POA: Diagnosis not present

## 2018-08-27 DIAGNOSIS — E8770 Fluid overload, unspecified: Secondary | ICD-10-CM | POA: Diagnosis not present

## 2018-08-27 DIAGNOSIS — R6884 Jaw pain: Secondary | ICD-10-CM | POA: Diagnosis not present

## 2018-08-27 DIAGNOSIS — Z992 Dependence on renal dialysis: Secondary | ICD-10-CM | POA: Diagnosis not present

## 2018-08-27 DIAGNOSIS — R69 Illness, unspecified: Secondary | ICD-10-CM | POA: Diagnosis not present

## 2018-08-27 DIAGNOSIS — J810 Acute pulmonary edema: Secondary | ICD-10-CM | POA: Diagnosis not present

## 2018-08-27 DIAGNOSIS — N2581 Secondary hyperparathyroidism of renal origin: Secondary | ICD-10-CM | POA: Diagnosis not present

## 2018-08-27 LAB — BASIC METABOLIC PANEL
Anion gap: 14 (ref 5–15)
BUN: 58 mg/dL — AB (ref 6–20)
CO2: 24 mmol/L (ref 22–32)
CREATININE: 9.36 mg/dL — AB (ref 0.61–1.24)
Calcium: 7.7 mg/dL — ABNORMAL LOW (ref 8.9–10.3)
Chloride: 94 mmol/L — ABNORMAL LOW (ref 98–111)
GFR calc Af Amer: 7 mL/min — ABNORMAL LOW (ref >60)
GFR calc non Af Amer: 6 mL/min — ABNORMAL LOW (ref >60)
Glucose, Bld: 75 mg/dL (ref 70–99)
Potassium: 4.2 mmol/L (ref 3.5–5.1)
Sodium: 132 mmol/L — ABNORMAL LOW (ref 135–145)

## 2018-08-27 LAB — GLUCOSE, CAPILLARY
Glucose-Capillary: 70 mg/dL (ref 70–99)
Glucose-Capillary: 76 mg/dL (ref 70–99)
Glucose-Capillary: 75 mg/dL (ref 70–99)
Glucose-Capillary: 81 mg/dL (ref 70–99)

## 2018-08-27 MED ORDER — AMLODIPINE BESYLATE 10 MG PO TABS
10.0000 mg | ORAL_TABLET | Freq: Every day | ORAL | Status: DC
  Administered 2018-08-28 – 2018-08-29 (×2): 10 mg via ORAL
  Filled 2018-08-27 (×2): qty 1

## 2018-08-27 MED ORDER — FENTANYL CITRATE (PF) 100 MCG/2ML IJ SOLN
12.5000 ug | Freq: Once | INTRAMUSCULAR | Status: DC
  Filled 2018-08-27: qty 2

## 2018-08-27 MED ORDER — TRAMADOL HCL 50 MG PO TABS
50.0000 mg | ORAL_TABLET | Freq: Four times a day (QID) | ORAL | Status: DC | PRN
  Administered 2018-08-27: 50 mg via ORAL
  Filled 2018-08-27: qty 1

## 2018-08-27 NOTE — Progress Notes (Signed)
   08/27/18 0715  Provider Notification  Provider Name/Title Dr. Maylene Roes  Date Provider Notified 08/27/18  Time Provider Notified [phone]  Notification Type Page  Notification Reason Other (Comment) (Pt refused to go to dialysis thia morning)  Response Other (Comment) (awaitng call back)

## 2018-08-27 NOTE — Progress Notes (Signed)
Attempted to get pt for Dialysis tx but refused. Nephrologist PA made aware.

## 2018-08-27 NOTE — Progress Notes (Signed)
PROGRESS NOTE    [name]  JME:[id number] DOB: [birth year] DOA: 08/16/2018 PCP: Shelby Mattocks, PA-C     Brief Narrative:  George [name] is a 48 year old with past medical history relevant for ESRD on dialysis on M/W/F, noncompliant, hypertension, seizure disorder, hyperlipidemia who presents with acute hypoxic respiratory failure and hyperkalemia due to missing dialysis for social reasons.  He subsequently developed passive suicidal ideation; psych consulted. They initially recommended inpatient psych facility transfer, now cleared from psych standpoint.   New events last 24 hours / Subjective: Sudden onset left upper dental pain that started early this morning. Due to this and a headache, he has refused HD until his pain improves.   Assessment & Plan:   Principal Problem:   Acute pulmonary edema (HCC) Active Problems:   Hypoxia   Acute respiratory failure (HCC)   ESRD on peritoneal dialysis ([place])   Essential hypertension   Hyperkalemia   Seizure (HCC)   MDD (major depressive disorder), single episode, moderate (HCC)   MDD (major depressive disorder), single episode, severe , no psychosis ([place])   Acute hypoxemic respiratory failure Resolved with dialysis, now on room air   ESRD with volume overload, pulmonary edema  Patient noted to be noncompliant Continue dialysis MWF  Suicidal ideation Psych consulted, initially was recommended for inpatient psych facility transfer Psych reevaluated patient on 2/25, patient is cleared at this time Continue Lexapro No suicidal ideation today, states mood slightly improved   Hyperlipidemia Continue pravastatin  Hypertension Continue amlodipine, carvedilol, hydralazine  CAD Continue bidil   Seizure disorder Continue Keppra  Dental pain Pain control Check panorama    DVT prophylaxis: Subq hep Code Status: Full Family Communication: No family at bedside Disposition Plan: Patient homeless. SW/CM provide  resources   Consultants:   Nephrology  Psych  Procedures:   None   Antimicrobials:  Anti-infectives (From admission, onward)   None       Objective: Vitals:   08/27/18 0357 08/27/18 0850 08/27/18 0852 08/27/18 1153  BP: (!) 141/88 (!) 142/82  (!) 137/91  Pulse: 65  (!) 58 61  Resp: 18   12  Temp: 98.3 F (36.8 C)   98.3 F (36.8 C)  TempSrc: Oral   Oral  SpO2: 97%   98%  Weight:      Height:        Intake/Output Summary (Last 24 hours) at 08/27/2018 1333 Last data filed at 08/27/2018 1317 Gross per 24 hour  Intake 960 ml  Output -  Net 960 ml   Filed Weights   08/25/18 1154 08/26/18 0451 08/27/18 0111  Weight: 96 kg 97.2 kg 98.2 kg    Examination: General exam: Appears calm and comfortable  Respiratory system: Clear to auscultation. Respiratory effort normal. Cardiovascular system: S1 & S2 heard, RRR. No JVD, murmurs, rubs, gallops or clicks. No pedal edema. Gastrointestinal system: Abdomen is nondistended, soft and nontender. No organomegaly or masses felt. Normal bowel sounds heard. Central nervous system: Alert and oriented. No focal neurological deficits. Extremities: Symmetric 5 x 5 power. Skin: No rashes, lesions or ulcers Psychiatry: Judgement and insight appear stable    Data Reviewed: I have personally reviewed following labs and imaging studies  CBC: Recent Labs  Lab 08/20/18 1339 08/21/18 0434 08/23/18 0836 08/25/18 0538  WBC 8.9 8.2 8.2 8.5  HGB 10.4* 10.2* 10.3* 11.1*  HCT 30.0* 29.7* 30.5* 33.3*  MCV 95.2 94.0 94.1 94.6  PLT 153 169 181 229   Basic Metabolic Panel: Recent Labs  Lab 08/21/18  2993 08/22/18 0515 08/23/18 0516 08/24/18 0447 08/25/18 0538 08/25/18 0745 08/26/18 0440 08/27/18 0506  NA 134* 132* 131* 131* 131* 132* 132* 132*  K 4.1 4.4 4.7 4.3 4.5 4.2 4.0 4.2  CL 96* 94* 94* 95* 94* 94* 96* 94*  CO2 27 25 23 27 24 24 27 24   GLUCOSE 90 99 103* 124* 82 140* 84 75  BUN 25* 44* 61* 41* 64* 63* 40* 58*    CREATININE 6.86* 9.05* 10.73* 7.94* 10.05* 10.12* 7.40* 9.36*  CALCIUM 8.3* 8.0* 8.0* 8.3* 8.1* 8.4* 7.8* 7.7*  PHOS 4.7* 4.3 4.9*  --  4.4 4.2  --   --    GFR: Estimated Creatinine Clearance: 10 mL/min (A) (by C-G formula based on SCr of 9.36 mg/dL (H)). Liver Function Tests: Recent Labs  Lab 08/21/18 0434 08/22/18 0515 08/23/18 0516 08/25/18 0538 08/25/18 0745  ALBUMIN 3.0* 3.0* 2.9* 3.0* 3.1*   No results for input(s): LIPASE, AMYLASE in the last 168 hours. No results for input(s): AMMONIA in the last 168 hours. Coagulation Profile: No results for input(s): INR, PROTIME in the last 168 hours. Cardiac Enzymes: No results for input(s): CKTOTAL, CKMB, CKMBINDEX, TROPONINI in the last 168 hours. BNP (last 3 results) No results for input(s): PROBNP in the last 8760 hours. HbA1C: No results for input(s): HGBA1C in the last 72 hours. CBG: Recent Labs  Lab 08/26/18 1137 08/26/18 1642 08/26/18 2132 08/27/18 0601 08/27/18 1150  GLUCAP 76 112* 78 70 76   Lipid Profile: No results for input(s): CHOL, HDL, LDLCALC, TRIG, CHOLHDL, LDLDIRECT in the last 72 hours. Thyroid Function Tests: Recent Labs    08/25/18 0538  TSH 0.452   Anemia Panel: No results for input(s): VITAMINB12, FOLATE, FERRITIN, TIBC, IRON, RETICCTPCT in the last 72 hours. Sepsis Labs: No results for input(s): PROCALCITON, LATICACIDVEN in the last 168 hours.  No results found for this or any previous visit (from the past 240 hour(s)).     Radiology Studies: No results found.    Scheduled Meds: . [START ON 08/28/2018] amLODipine  10 mg Oral QHS  . calcium acetate  1,334 mg Oral TID WC  . carvedilol  25 mg Oral BID WC  . Chlorhexidine Gluconate Cloth  6 each Topical Q0600  . escitalopram  10 mg Oral Daily  . fentaNYL (SUBLIMAZE) injection  12.5 mcg Intravenous Once  . heparin  5,000 Units Subcutaneous Q8H  . isosorbide-hydrALAZINE  1 tablet Oral TID  . lanthanum  1,000 mg Oral TID WC  .  levETIRAcetam  500 mg Oral BID  . multivitamin  1 tablet Oral QHS  . pravastatin  20 mg Oral QPM   Continuous Infusions:   LOS: 11 days    Time spent: 25 minutes   Dessa Phi, DO [facility] [email or web address] 08/27/2018, 1:33 PM

## 2018-08-27 NOTE — Progress Notes (Signed)
CM met with the patient and the bedside about his d/c plans. He states he doesn't have a set place he can go. CM inquired about he shelter list he received and he states that he has called and left messages on their voicemail but no return calls.  CM inquired about housing such as ALF through his [place]. 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  Pt will need a cab voucher to assist him in getting to a destination near [address] per his request. CSW updated.  MD informed that we dont have any further resources for him.

## 2018-08-27 NOTE — Progress Notes (Addendum)
[facility] Progress Note   Subjective:  Refused to come for HD early this morning d/t toothache and headache - seen in room to discuss, just got pain medication and "waiting for it to kick in". He says that if pain improves, he is willing to come for HD. Denies CP, dyspnea - still with occ cough.   Objective Vitals:   08/27/18 0111 08/27/18 0357 08/27/18 0850 08/27/18 0852  BP:  (!) 141/88 (!) 142/82   Pulse:  65  (!) 58  Resp:  18    Temp:  98.3 F (36.8 C)    TempSrc:  Oral    SpO2:  97%    Weight: 98.2 kg     Height:       Physical Exam General: Well appearing man, NAD. + facial edema Heart: RRR; 2/6 systolic murmur LV lift Lungs: Clear in upper lobes, rhonchi to RLL Abdomen: soft, non-tender Liver down 5 cm Extremities: 1+ LE edema Dialysis Access: LUE AVF + thrill  Additional Objective Labs: Basic Metabolic Panel: Recent Labs  Lab 08/23/18 0516  08/25/18 0538 08/25/18 0745 08/26/18 0440 08/27/18 0506  NA 131*   < > 131* 132* 132* 132*  K 4.7   < > 4.5 4.2 4.0 4.2  CL 94*   < > 94* 94* 96* 94*  CO2 23   < > 24 24 27 24   GLUCOSE 103*   < > 82 140* 84 75  BUN 61*   < > 64* 63* 40* 58*  CREATININE 10.73*   < > 10.05* 10.12* 7.40* 9.36*  CALCIUM 8.0*   < > 8.1* 8.4* 7.8* 7.7*  PHOS 4.9*  --  4.4 4.2  --   --    < > = values in this interval not displayed.   Liver Function Tests: Recent Labs  Lab 08/23/18 0516 08/25/18 0538 08/25/18 0745  ALBUMIN 2.9* 3.0* 3.1*   CBC: Recent Labs  Lab 08/20/18 1339 08/21/18 0434 08/23/18 0836 08/25/18 0538  WBC 8.9 8.2 8.2 8.5  HGB 10.4* 10.2* 10.3* 11.1*  HCT 30.0* 29.7* 30.5* 33.3*  MCV 95.2 94.0 94.1 94.6  PLT 153 169 181 206   Medications:  . [START ON 08/28/2018] amLODipine  10 mg Oral QHS  . calcium acetate  1,334 mg Oral TID WC  . carvedilol  25 mg Oral BID WC  . Chlorhexidine Gluconate Cloth  6 each Topical Q0600  . escitalopram  10 mg Oral Daily  . heparin  5,000 Units Subcutaneous  Q8H  . isosorbide-hydrALAZINE  1 tablet Oral TID  . lanthanum  1,000 mg Oral TID WC  . levETIRAcetam  500 mg Oral BID  . multivitamin  1 tablet Oral QHS  . pravastatin  20 mg Oral QPM    Dialysis Orders: [facility] MWF 4.5h 94.5kg 2/2.5 bath 14ga 400/800 Hep none LUA AVF  Assessment/ Plan:  1. Pulmonary edema: Improved with HD. Still some xs vol 2. Depression/passive suicidal ideation:No longer has sitter, and plan initially for inpatient psych has been changed as he has responded to lexapro and trazadone.   3. ESRD: Continue HD per MWF schedule - initially refused this morning, will try to circle back and dialyze him this afternoon. 4. Anemia:Hgb 11.1 - no need for ESA at this time. 5. CKD-MBD:Corr Ca and Phos ok. Continue Fosrenol and Phoslo as binders. No VDRA. 6. Nutrition:Alb low, start pro-stat supplements. 7. Hypertension: BP slightly high, UF as tolerated. Use Hs meds 8. Toothache/headache: New on 2/28 -  new primary. 9. Disposition: Pt homeless.Appreciated SW/CM assistance with placement and resources.  Since he no longer requires inpatient Psych hospitalization, he will need shelter resources prior to discharge.  He also reports a desire to return to [place], but will need to have his SW at his home HD unit assist with placement there.  Plan for now is to return to his home HD unit once shelter arrangements can be made - case manager and SW working on this.    Veneta Penton, PA-C 08/27/2018, 10:16 AM  [facility] Pager: [phone] I have seen and examined this patient and agree with the plan of care seen, examined, counseled, eval. .  Jeneen Rinks Rafal Archuleta 08/27/2018, 10:49 AM

## 2018-08-27 NOTE — Progress Notes (Signed)
Patient is having toothache causing him to have a headache 10/10. Tramadol given earlier and patient said it helps a little bit but it came back. MD is notified she put an IV order in for fentanyl but patient did not have an IV access and refused to have an IV put in. He said he does not want it. He also refused to take his binders and said he is not eating. MD is aware.

## 2018-08-28 DIAGNOSIS — N186 End stage renal disease: Secondary | ICD-10-CM | POA: Diagnosis not present

## 2018-08-28 DIAGNOSIS — N2581 Secondary hyperparathyroidism of renal origin: Secondary | ICD-10-CM | POA: Diagnosis not present

## 2018-08-28 DIAGNOSIS — E875 Hyperkalemia: Secondary | ICD-10-CM | POA: Diagnosis not present

## 2018-08-28 DIAGNOSIS — E8770 Fluid overload, unspecified: Secondary | ICD-10-CM | POA: Diagnosis not present

## 2018-08-28 DIAGNOSIS — J810 Acute pulmonary edema: Secondary | ICD-10-CM | POA: Diagnosis not present

## 2018-08-28 DIAGNOSIS — I120 Hypertensive chronic kidney disease with stage 5 chronic kidney disease or end stage renal disease: Secondary | ICD-10-CM | POA: Diagnosis not present

## 2018-08-28 DIAGNOSIS — Z992 Dependence on renal dialysis: Secondary | ICD-10-CM | POA: Diagnosis not present

## 2018-08-28 DIAGNOSIS — R69 Illness, unspecified: Secondary | ICD-10-CM | POA: Diagnosis not present

## 2018-08-28 DIAGNOSIS — D631 Anemia in chronic kidney disease: Secondary | ICD-10-CM | POA: Diagnosis not present

## 2018-08-28 LAB — BASIC METABOLIC PANEL
Anion gap: 14 (ref 5–15)
BUN: 73 mg/dL — ABNORMAL HIGH (ref 6–20)
CO2: 25 mmol/L (ref 22–32)
Calcium: 7.9 mg/dL — ABNORMAL LOW (ref 8.9–10.3)
Chloride: 93 mmol/L — ABNORMAL LOW (ref 98–111)
Creatinine, Ser: 11.62 mg/dL — ABNORMAL HIGH (ref 0.61–1.24)
GFR calc Af Amer: 5 mL/min — ABNORMAL LOW (ref >60)
GFR, EST NON AFRICAN AMERICAN: 5 mL/min — AB (ref >60)
Glucose, Bld: 76 mg/dL (ref 70–99)
Potassium: 4.3 mmol/L (ref 3.5–5.1)
SODIUM: 132 mmol/L — AB (ref 135–145)

## 2018-08-28 LAB — GLUCOSE, CAPILLARY
Glucose-Capillary: 69 mg/dL — ABNORMAL LOW (ref 70–99)
Glucose-Capillary: 81 mg/dL (ref 70–99)
Glucose-Capillary: 77 mg/dL (ref 70–99)
Glucose-Capillary: 127 mg/dL — ABNORMAL HIGH (ref 70–99)

## 2018-08-28 MED ORDER — BENZOCAINE 10 % MT GEL
Freq: Four times a day (QID) | OROMUCOSAL | Status: DC | PRN
  Administered 2018-08-28: 11:00:00 via OROMUCOSAL
  Administered 2018-08-28: 1 via OROMUCOSAL
  Administered 2018-08-29 – 2018-08-30 (×3): via OROMUCOSAL
  Filled 2018-08-28: qty 9

## 2018-08-28 NOTE — Progress Notes (Signed)
Subjective: Interval History: has complaints , still jaw pain.  Objective: Vital signs in last 24 hours: Temp:  [98.2 F (36.8 C)-98.4 F (36.9 C)] 98.4 F (36.9 C) (02/29 0523) Pulse Rate:  [58-63] 63 (02/29 0523) Resp:  [12-18] 17 (02/29 0523) BP: (137-152)/(82-98) 152/95 (02/29 0523) SpO2:  [93 %-98 %] 93 % (02/29 0523) Weight:  [98.7 kg] 98.7 kg (02/29 0523) Weight change: 0.496 kg  Intake/Output from previous day: 02/28 0701 - 02/29 0700 In: 960 [P.O.:960] Out: 0  Intake/Output this shift: No intake/output data recorded.  General appearance: alert, cooperative, no distress and mildly obese Resp: diminished breath sounds bilaterally Cardio: S1, S2 normal and systolic murmur: systolic ejection 2/6, crescendo and decrescendo at 2nd left intercostal space GI: soft, pos bs , liver down 4 cm Extremities: avf, LUA  Lab Results: No results for input(s): WBC, HGB, HCT, PLT in the last 72 hours. BMET:  Recent Labs    08/26/18 0440 08/27/18 0506  NA 132* 132*  K 4.0 4.2  CL 96* 94*  CO2 27 24  GLUCOSE 84 75  BUN 40* 58*  CREATININE 7.40* 9.36*  CALCIUM 7.8* 7.7*   No results for input(s): PTH in the last 72 hours. Iron Studies: No results for input(s): IRON, TIBC, TRANSFERRIN, FERRITIN in the last 72 hours.  Studies/Results: Dg Orthopantogram  Result Date: 08/27/2018 CLINICAL DATA:  Left-sided jaw pain EXAM: ORTHOPANTOGRAM/PANORAMIC COMPARISON:  None. FINDINGS: Panoramic view of the mandible demonstrates no acute fracture. Dental caries are noted involving the left second maxillary molar as well as the right third maxillary molar. No periapical abscess is noted. No other focal abnormality is seen. IMPRESSION: Dental caries as described.  No acute abnormality is noted. Electronically Signed   By: Inez Catalina M.D.   On: 08/27/2018 16:56    I have reviewed the patient's current medications.  Assessment/Plan: 1 ESRD refused HD. Will hold off, as no good reason to  refuse 2 Anemia ok 3 HPTH vit D 4 HTN on meds 5 Jaw pain per primary 6 Social situation  P HD MWF, cont bp meds.    LOS: 12 days   George [name] 08/28/2018,7:07 AM

## 2018-08-28 NOTE — Progress Notes (Signed)
PROGRESS NOTE    George [name]  YSA:[id number] DOB: Sep 26, 1970 DOA: 08/16/2018 PCP: Shelby Mattocks, PA-C     Brief Narrative:  George [name] is a 48 year old with past medical history relevant for ESRD on dialysis on M/W/F, noncompliant, hypertension, seizure disorder, hyperlipidemia who presents with acute hypoxic respiratory failure and hyperkalemia due to missing dialysis for social reasons.  He subsequently developed passive suicidal ideation; psych consulted. They initially recommended inpatient psych facility transfer, now cleared from psych standpoint.   New events last 24 hours / Subjective: Continues to have dental pain  Assessment & Plan:   Principal Problem:   Acute pulmonary edema (HCC) Active Problems:   Hypoxia   Acute respiratory failure (HCC)   ESRD on peritoneal dialysis ([place])   Essential hypertension   Hyperkalemia   Seizure (HCC)   MDD (major depressive disorder), single episode, moderate (HCC)   MDD (major depressive disorder), single episode, severe , no psychosis ([place])   Acute hypoxemic respiratory failure Resolved with dialysis, now on room air   ESRD with volume overload, pulmonary edema  Patient noted to be noncompliant Continue dialysis MWF  Suicidal ideation Psych consulted, initially was recommended for inpatient psych facility transfer Psych reevaluated patient on 2/25, patient is cleared at this time Continue Lexapro  Hyperlipidemia Continue pravastatin  Hypertension Continue amlodipine, carvedilol, hydralazine  CAD Continue bidil   Seizure disorder Continue Keppra  Dental caries Pain control Panorama revealed dental caries.  We will plan to touch base with inpatient dental coverage on Monday   DVT prophylaxis: Subq hep Code Status: Full Family Communication: No family at bedside Disposition Plan: Patient homeless. SW/CM provided resources   Consultants:   Nephrology  Psych  Procedures:   None    Antimicrobials:  Anti-infectives (From admission, onward)   None       Objective: Vitals:   08/27/18 0852 08/27/18 1153 08/27/18 2057 08/28/18 0523  BP:  (!) 137/91 (!) 148/98 (!) 152/95  Pulse: (!) 58 61 62 63  Resp:  12 18 17   Temp:  98.3 F (36.8 C) 98.2 F (36.8 C) 98.4 F (36.9 C)  TempSrc:  Oral Oral Oral  SpO2:  98% 96% 93%  Weight:    98.7 kg  Height:        Intake/Output Summary (Last 24 hours) at 08/28/2018 1023 Last data filed at 08/28/2018 3235 Gross per 24 hour  Intake 1080 ml  Output 0 ml  Net 1080 ml   Filed Weights   08/26/18 0451 08/27/18 0111 08/28/18 0523  Weight: 97.2 kg 98.2 kg 98.7 kg    Examination: General exam: Appears calm but uncomfortable due to dental pain Respiratory system: Clear to auscultation. Respiratory effort normal. Cardiovascular system: S1 & S2 heard, RRR. No JVD, murmurs, rubs, gallops or clicks. No pedal edema. Gastrointestinal system: Abdomen is nondistended, soft and nontender. No organomegaly or masses felt. Normal bowel sounds heard. Central nervous system: Alert and oriented. No focal neurological deficits. Extremities: Symmetric 5 x 5 power. Skin: No rashes, lesions or ulcers Psychiatry: Judgement and insight appear stable  Data Reviewed: I have personally reviewed following labs and imaging studies  CBC: Recent Labs  Lab 08/23/18 0836 08/25/18 0538  WBC 8.2 8.5  HGB 10.3* 11.1*  HCT 30.5* 33.3*  MCV 94.1 94.6  PLT 181 573   Basic Metabolic Panel: Recent Labs  Lab 08/22/18 0515 08/23/18 0516  08/25/18 0538 08/25/18 0745 08/26/18 0440 08/27/18 0506 08/28/18 0626  NA 132* 131*   < > 131* 132*  132* 132* 132*  K 4.4 4.7   < > 4.5 4.2 4.0 4.2 4.3  CL 94* 94*   < > 94* 94* 96* 94* 93*  CO2 25 23   < > 24 24 27 24 25   GLUCOSE 99 103*   < > 82 140* 84 75 76  BUN 44* 61*   < > 64* 63* 40* 58* 73*  CREATININE 9.05* 10.73*   < > 10.05* 10.12* 7.40* 9.36* 11.62*  CALCIUM 8.0* 8.0*   < > 8.1* 8.4* 7.8*  7.7* 7.9*  PHOS 4.3 4.9*  --  4.4 4.2  --   --   --    < > = values in this interval not displayed.   GFR: Estimated Creatinine Clearance: 8.1 mL/min (A) (by C-G formula based on SCr of 11.62 mg/dL (H)). Liver Function Tests: Recent Labs  Lab 08/22/18 0515 08/23/18 0516 08/25/18 0538 08/25/18 0745  ALBUMIN 3.0* 2.9* 3.0* 3.1*   No results for input(s): LIPASE, AMYLASE in the last 168 hours. No results for input(s): AMMONIA in the last 168 hours. Coagulation Profile: No results for input(s): INR, PROTIME in the last 168 hours. Cardiac Enzymes: No results for input(s): CKTOTAL, CKMB, CKMBINDEX, TROPONINI in the last 168 hours. BNP (last 3 results) No results for input(s): PROBNP in the last 8760 hours. HbA1C: No results for input(s): HGBA1C in the last 72 hours. CBG: Recent Labs  Lab 08/27/18 0601 08/27/18 1150 08/27/18 1653 08/27/18 2058 08/28/18 0637  GLUCAP 70 76 75 81 69*   Lipid Profile: No results for input(s): CHOL, HDL, LDLCALC, TRIG, CHOLHDL, LDLDIRECT in the last 72 hours. Thyroid Function Tests: No results for input(s): TSH, T4TOTAL, FREET4, T3FREE, THYROIDAB in the last 72 hours. Anemia Panel: No results for input(s): VITAMINB12, FOLATE, FERRITIN, TIBC, IRON, RETICCTPCT in the last 72 hours. Sepsis Labs: No results for input(s): PROCALCITON, LATICACIDVEN in the last 168 hours.  No results found for this or any previous visit (from the past 240 hour(s)).     Radiology Studies: Dg Orthopantogram  Result Date: 08/27/2018 CLINICAL DATA:  Left-sided jaw pain EXAM: ORTHOPANTOGRAM/PANORAMIC COMPARISON:  None. FINDINGS: Panoramic view of the mandible demonstrates no acute fracture. Dental caries are noted involving the left second maxillary molar as well as the right third maxillary molar. No periapical abscess is noted. No other focal abnormality is seen. IMPRESSION: Dental caries as described.  No acute abnormality is noted. Electronically Signed   By: Inez Catalina M.D.   On: 08/27/2018 16:56      Scheduled Meds: . amLODipine  10 mg Oral QHS  . calcium acetate  1,334 mg Oral TID WC  . carvedilol  25 mg Oral BID WC  . Chlorhexidine Gluconate Cloth  6 each Topical Q0600  . escitalopram  10 mg Oral Daily  . fentaNYL (SUBLIMAZE) injection  12.5 mcg Intravenous Once  . heparin  5,000 Units Subcutaneous Q8H  . isosorbide-hydrALAZINE  1 tablet Oral TID  . lanthanum  1,000 mg Oral TID WC  . levETIRAcetam  500 mg Oral BID  . multivitamin  1 tablet Oral QHS  . pravastatin  20 mg Oral QPM   Continuous Infusions:   LOS: 12 days    Time spent: 25 minutes   Dessa Phi, DO [facility] [email or web address] 08/28/2018, 10:23 AM

## 2018-08-28 NOTE — Progress Notes (Signed)
Pt's CBG is 69, asymptomatic. pt refused to take anything, pt wants to wait for breakfast, pt alert and oriented.

## 2018-08-29 DIAGNOSIS — D631 Anemia in chronic kidney disease: Secondary | ICD-10-CM | POA: Diagnosis not present

## 2018-08-29 DIAGNOSIS — Z992 Dependence on renal dialysis: Secondary | ICD-10-CM | POA: Diagnosis not present

## 2018-08-29 DIAGNOSIS — I120 Hypertensive chronic kidney disease with stage 5 chronic kidney disease or end stage renal disease: Secondary | ICD-10-CM | POA: Diagnosis not present

## 2018-08-29 DIAGNOSIS — N186 End stage renal disease: Secondary | ICD-10-CM | POA: Diagnosis not present

## 2018-08-29 DIAGNOSIS — E875 Hyperkalemia: Secondary | ICD-10-CM | POA: Diagnosis not present

## 2018-08-29 DIAGNOSIS — N2581 Secondary hyperparathyroidism of renal origin: Secondary | ICD-10-CM | POA: Diagnosis not present

## 2018-08-29 DIAGNOSIS — J810 Acute pulmonary edema: Secondary | ICD-10-CM | POA: Diagnosis not present

## 2018-08-29 DIAGNOSIS — R69 Illness, unspecified: Secondary | ICD-10-CM | POA: Diagnosis not present

## 2018-08-29 DIAGNOSIS — E8770 Fluid overload, unspecified: Secondary | ICD-10-CM | POA: Diagnosis not present

## 2018-08-29 LAB — GLUCOSE, CAPILLARY
GLUCOSE-CAPILLARY: 77 mg/dL (ref 70–99)
Glucose-Capillary: 118 mg/dL — ABNORMAL HIGH (ref 70–99)
Glucose-Capillary: 82 mg/dL (ref 70–99)
Glucose-Capillary: 124 mg/dL — ABNORMAL HIGH (ref 70–99)
Glucose-Capillary: 109 mg/dL — ABNORMAL HIGH (ref 70–99)

## 2018-08-29 LAB — BASIC METABOLIC PANEL
ANION GAP: 12 (ref 5–15)
BUN: 86 mg/dL — ABNORMAL HIGH (ref 6–20)
CO2: 24 mmol/L (ref 22–32)
Calcium: 7.7 mg/dL — ABNORMAL LOW (ref 8.9–10.3)
Chloride: 95 mmol/L — ABNORMAL LOW (ref 98–111)
Creatinine, Ser: 12.85 mg/dL — ABNORMAL HIGH (ref 0.61–1.24)
GFR calc non Af Amer: 4 mL/min — ABNORMAL LOW (ref >60)
GFR, EST AFRICAN AMERICAN: 5 mL/min — AB (ref >60)
Glucose, Bld: 79 mg/dL (ref 70–99)
Potassium: 5 mmol/L (ref 3.5–5.1)
Sodium: 131 mmol/L — ABNORMAL LOW (ref 135–145)

## 2018-08-29 MED ORDER — CHLORHEXIDINE GLUCONATE CLOTH 2 % EX PADS
6.0000 | MEDICATED_PAD | Freq: Every day | CUTANEOUS | Status: DC
  Administered 2018-08-29: 6 via TOPICAL

## 2018-08-29 NOTE — Progress Notes (Signed)
Patient is in bed with no complains, he is aware and educated about dialysis tomorrow, he is agreeing to go. His toothache is better he reported it is not bothering him like it did before.

## 2018-08-29 NOTE — Progress Notes (Signed)
PROGRESS NOTE    George [name]  ZOX:[id number] DOB: [birth year] DOA: 08/16/2018 PCP: Shelby Mattocks, PA-C     Brief Narrative:  George [name] is a 48 year old with past medical history relevant for ESRD on dialysis on M/W/F, noncompliant, hypertension, seizure disorder, hyperlipidemia who presents with acute hypoxic respiratory failure and hyperkalemia due to missing dialysis for social reasons.  He subsequently developed passive suicidal ideation; psych consulted. They initially recommended inpatient psych facility transfer, now cleared from psych standpoint.   New events last 24 hours / Subjective: Dental pain improved with Orajel.  No other complaints today.  Assessment & Plan:   Principal Problem:   Acute pulmonary edema (HCC) Active Problems:   Hypoxia   Acute respiratory failure (HCC)   ESRD on peritoneal dialysis ([place])   Essential hypertension   Hyperkalemia   Seizure (HCC)   MDD (major depressive disorder), single episode, moderate (HCC)   MDD (major depressive disorder), single episode, severe , no psychosis ([place])   Acute hypoxemic respiratory failure Resolved with dialysis, now on room air   ESRD with volume overload, pulmonary edema  Patient noted to be noncompliant Continue dialysis MWF  Suicidal ideation Psych consulted, initially was recommended for inpatient psych facility transfer Psych reevaluated patient on 2/25, patient is cleared at this time Continue Lexapro  Hyperlipidemia Continue pravastatin  Hypertension Continue amlodipine, carvedilol, hydralazine  CAD Continue bidil   Seizure disorder Continue Keppra  Dental caries Pain control Panorama revealed dental caries.  We will plan to touch base with inpatient dental coverage on Monday   DVT prophylaxis: Subq hep Code Status: Full Family Communication: No family at bedside Disposition Plan: Patient homeless. SW/CM provided resources   Consultants:    Nephrology  Psych  Procedures:   None   Antimicrobials:  Anti-infectives (From admission, onward)   None       Objective: Vitals:   08/28/18 0523 08/28/18 1154 08/28/18 1949 08/29/18 0415  BP: (!) 152/95 130/70 (!) 142/79 (!) 141/83  Pulse: 63 61 66 67  Resp: 17 20 20 18   Temp: 98.4 F (36.9 C) 97.8 F (36.6 C) 98.1 F (36.7 C) 98.2 F (36.8 C)  TempSrc: Oral Oral Oral Oral  SpO2: 93% 100% 97% 95%  Weight: 98.7 kg   99.2 kg  Height:        Intake/Output Summary (Last 24 hours) at 08/29/2018 1101 Last data filed at 08/29/2018 0900 Gross per 24 hour  Intake 680 ml  Output 0 ml  Net 680 ml   Filed Weights   08/27/18 0111 08/28/18 0523 08/29/18 0415  Weight: 98.2 kg 98.7 kg 99.2 kg    Examination: General exam: Appears calm and comfortable  Respiratory system: Clear to auscultation. Respiratory effort normal. Cardiovascular system: S1 & S2 heard, RRR. No JVD, murmurs, rubs, gallops or clicks. No pedal edema. Gastrointestinal system: Abdomen is nondistended, soft and nontender. No organomegaly or masses felt. Normal bowel sounds heard. Central nervous system: Alert and oriented. No focal neurological deficits. Extremities: Symmetric 5 x 5 power. Skin: No rashes, lesions or ulcers Psychiatry: Judgement and insight appear stable  Data Reviewed: I have personally reviewed following labs and imaging studies  CBC: Recent Labs  Lab 08/23/18 0836 08/25/18 0538  WBC 8.2 8.5  HGB 10.3* 11.1*  HCT 30.5* 33.3*  MCV 94.1 94.6  PLT 181 811   Basic Metabolic Panel: Recent Labs  Lab 08/23/18 0516  08/25/18 0538 08/25/18 0745 08/26/18 0440 08/27/18 0506 08/28/18 0626 08/29/18 0417  NA 131*   < >  131* 132* 132* 132* 132* 131*  K 4.7   < > 4.5 4.2 4.0 4.2 4.3 5.0  CL 94*   < > 94* 94* 96* 94* 93* 95*  CO2 23   < > 24 24 27 24 25 24   GLUCOSE 103*   < > 82 140* 84 75 76 79  BUN 61*   < > 64* 63* 40* 58* 73* 86*  CREATININE 10.73*   < > 10.05* 10.12* 7.40*  9.36* 11.62* 12.85*  CALCIUM 8.0*   < > 8.1* 8.4* 7.8* 7.7* 7.9* 7.7*  PHOS 4.9*  --  4.4 4.2  --   --   --   --    < > = values in this interval not displayed.   GFR: Estimated Creatinine Clearance: 7.4 mL/min (A) (by C-G formula based on SCr of 12.85 mg/dL (H)). Liver Function Tests: Recent Labs  Lab 08/23/18 0516 08/25/18 0538 08/25/18 0745  ALBUMIN 2.9* 3.0* 3.1*   No results for input(s): LIPASE, AMYLASE in the last 168 hours. No results for input(s): AMMONIA in the last 168 hours. Coagulation Profile: No results for input(s): INR, PROTIME in the last 168 hours. Cardiac Enzymes: No results for input(s): CKTOTAL, CKMB, CKMBINDEX, TROPONINI in the last 168 hours. BNP (last 3 results) No results for input(s): PROBNP in the last 8760 hours. HbA1C: No results for input(s): HGBA1C in the last 72 hours. CBG: Recent Labs  Lab 08/28/18 1152 08/28/18 1725 08/28/18 2115 08/29/18 0542 08/29/18 0619  GLUCAP 81 77 127* 77 118*   Lipid Profile: No results for input(s): CHOL, HDL, LDLCALC, TRIG, CHOLHDL, LDLDIRECT in the last 72 hours. Thyroid Function Tests: No results for input(s): TSH, T4TOTAL, FREET4, T3FREE, THYROIDAB in the last 72 hours. Anemia Panel: No results for input(s): VITAMINB12, FOLATE, FERRITIN, TIBC, IRON, RETICCTPCT in the last 72 hours. Sepsis Labs: No results for input(s): PROCALCITON, LATICACIDVEN in the last 168 hours.  No results found for this or any previous visit (from the past 240 hour(s)).     Radiology Studies: Dg Orthopantogram  Result Date: 08/27/2018 CLINICAL DATA:  Left-sided jaw pain EXAM: ORTHOPANTOGRAM/PANORAMIC COMPARISON:  None. FINDINGS: Panoramic view of the mandible demonstrates no acute fracture. Dental caries are noted involving the left second maxillary molar as well as the right third maxillary molar. No periapical abscess is noted. No other focal abnormality is seen. IMPRESSION: Dental caries as described.  No acute abnormality is  noted. Electronically Signed   By: Inez Catalina M.D.   On: 08/27/2018 16:56      Scheduled Meds: . amLODipine  10 mg Oral QHS  . calcium acetate  1,334 mg Oral TID WC  . carvedilol  25 mg Oral BID WC  . Chlorhexidine Gluconate Cloth  6 each Topical Q0600  . Chlorhexidine Gluconate Cloth  6 each Topical Q0600  . escitalopram  10 mg Oral Daily  . fentaNYL (SUBLIMAZE) injection  12.5 mcg Intravenous Once  . heparin  5,000 Units Subcutaneous Q8H  . isosorbide-hydrALAZINE  1 tablet Oral TID  . lanthanum  1,000 mg Oral TID WC  . levETIRAcetam  500 mg Oral BID  . multivitamin  1 tablet Oral QHS  . pravastatin  20 mg Oral QPM   Continuous Infusions:   LOS: 13 days    Time spent: 15 minutes   Dessa Phi, DO [facility] [email or web address] 08/29/2018, 11:01 AM

## 2018-08-29 NOTE — Progress Notes (Signed)
Subjective: Interval History: has no complaint , teeth better with oralgel, hopes to get out tomorrow.  Objective: Vital signs in last 24 hours: Temp:  [97.8 F (36.6 C)-98.2 F (36.8 C)] 98.2 F (36.8 C) (03/01 0415) Pulse Rate:  [61-67] 67 (03/01 0415) Resp:  [18-20] 18 (03/01 0415) BP: (130-142)/(70-83) 141/83 (03/01 0415) SpO2:  [95 %-100 %] 95 % (03/01 0415) Weight:  [99.2 kg] 99.2 kg (03/01 0415) Weight change: 0.456 kg  Intake/Output from previous day: 02/29 0701 - 03/01 0700 In: 800 [P.O.:800] Out: 0  Intake/Output this shift: No intake/output data recorded.  General appearance: alert, cooperative and no distress Resp: rhonchi bibasilar Cardio: S1, S2 normal and systolic murmur: systolic ejection 2/6, crescendo and decrescendo at 2nd left intercostal space GI: obese, pos bs, liver down 6 cm Extremities: AVF LUA   Lab Results: No results for input(s): WBC, HGB, HCT, PLT in the last 72 hours. BMET:  Recent Labs    08/28/18 0626 08/29/18 0417  NA 132* 131*  K 4.3 5.0  CL 93* 95*  CO2 25 24  GLUCOSE 76 79  BUN 73* 86*  CREATININE 11.62* 12.85*  CALCIUM 7.9* 7.7*   No results for input(s): PTH in the last 72 hours. Iron Studies: No results for input(s): IRON, TIBC, TRANSFERRIN, FERRITIN in the last 72 hours.  Studies/Results: Dg Orthopantogram  Result Date: 08/27/2018 CLINICAL DATA:  Left-sided jaw pain EXAM: ORTHOPANTOGRAM/PANORAMIC COMPARISON:  None. FINDINGS: Panoramic view of the mandible demonstrates no acute fracture. Dental caries are noted involving the left second maxillary molar as well as the right third maxillary molar. No periapical abscess is noted. No other focal abnormality is seen. IMPRESSION: Dental caries as described.  No acute abnormality is noted. Electronically Signed   By: Inez Catalina M.D.   On: 08/27/2018 16:56    I have reviewed the patient's current medications.  Assessment/Plan: 1 ESRD for HD in am.  Will need to explore where  he is to go for outpatient HD 2 Anemai stable 3 HPTH vi tD 4 HTN acceptable on meds 5 suicidal behavior per psych 6 Social 7 Nonadherence P HD, explore outpatient options.    LOS: 13 days   Trentin Knappenberger 08/29/2018,7:55 AM

## 2018-08-30 ENCOUNTER — Encounter (HOSPITAL_COMMUNITY): Payer: Self-pay | Admitting: Dentistry

## 2018-08-30 DIAGNOSIS — R69 Illness, unspecified: Secondary | ICD-10-CM | POA: Diagnosis not present

## 2018-08-30 DIAGNOSIS — N2581 Secondary hyperparathyroidism of renal origin: Secondary | ICD-10-CM | POA: Diagnosis not present

## 2018-08-30 DIAGNOSIS — J810 Acute pulmonary edema: Secondary | ICD-10-CM | POA: Diagnosis not present

## 2018-08-30 DIAGNOSIS — N186 End stage renal disease: Secondary | ICD-10-CM

## 2018-08-30 DIAGNOSIS — I120 Hypertensive chronic kidney disease with stage 5 chronic kidney disease or end stage renal disease: Secondary | ICD-10-CM | POA: Diagnosis not present

## 2018-08-30 DIAGNOSIS — Z992 Dependence on renal dialysis: Secondary | ICD-10-CM | POA: Diagnosis not present

## 2018-08-30 DIAGNOSIS — E8770 Fluid overload, unspecified: Secondary | ICD-10-CM | POA: Diagnosis not present

## 2018-08-30 DIAGNOSIS — E875 Hyperkalemia: Secondary | ICD-10-CM | POA: Diagnosis not present

## 2018-08-30 DIAGNOSIS — D631 Anemia in chronic kidney disease: Secondary | ICD-10-CM | POA: Diagnosis not present

## 2018-08-30 LAB — BASIC METABOLIC PANEL
Anion gap: 15 (ref 5–15)
BUN: 101 mg/dL — ABNORMAL HIGH (ref 6–20)
CHLORIDE: 96 mmol/L — AB (ref 98–111)
CO2: 20 mmol/L — ABNORMAL LOW (ref 22–32)
CREATININE: 15.01 mg/dL — AB (ref 0.61–1.24)
Calcium: 7.3 mg/dL — ABNORMAL LOW (ref 8.9–10.3)
GFR calc Af Amer: 4 mL/min — ABNORMAL LOW (ref >60)
GFR calc non Af Amer: 3 mL/min — ABNORMAL LOW (ref >60)
Glucose, Bld: 80 mg/dL (ref 70–99)
Potassium: 5.6 mmol/L — ABNORMAL HIGH (ref 3.5–5.1)
Sodium: 131 mmol/L — ABNORMAL LOW (ref 135–145)

## 2018-08-30 LAB — CBC
HCT: 30.6 % — ABNORMAL LOW (ref 39.0–52.0)
HEMOGLOBIN: 10.7 g/dL — AB (ref 13.0–17.0)
MCH: 32.6 pg (ref 26.0–34.0)
MCHC: 35 g/dL (ref 30.0–36.0)
MCV: 93.3 fL (ref 80.0–100.0)
Platelets: 167 10*3/uL (ref 150–400)
RBC: 3.28 MIL/uL — ABNORMAL LOW (ref 4.22–5.81)
RDW: 14.6 % (ref 11.5–15.5)
WBC: 5.1 10*3/uL (ref 4.0–10.5)
nRBC: 0 % (ref 0.0–0.2)

## 2018-08-30 LAB — GLUCOSE, CAPILLARY
Glucose-Capillary: 71 mg/dL (ref 70–99)
Glucose-Capillary: 91 mg/dL (ref 70–99)

## 2018-08-30 MED ORDER — HEPARIN SODIUM (PORCINE) 1000 UNIT/ML DIALYSIS
100.0000 [IU]/kg | INTRAMUSCULAR | Status: DC | PRN
  Filled 2018-08-30: qty 10

## 2018-08-30 MED ORDER — TRAZODONE HCL 50 MG PO TABS: 50.0000 mg | ORAL_TABLET | Freq: Every evening | ORAL | 0 refills | Status: AC | PRN

## 2018-08-30 MED ORDER — SODIUM CHLORIDE 0.9 % IV SOLN: 100.0000 mL | INTRAVENOUS | Status: DC | PRN

## 2018-08-30 MED ORDER — BENZOCAINE 10 % MT GEL: Freq: Four times a day (QID) | OROMUCOSAL | 0 refills | Status: DC | PRN

## 2018-08-30 MED ORDER — ALTEPLASE 2 MG IJ SOLR: 2.0000 mg | Freq: Once | INTRAMUSCULAR | Status: DC | PRN

## 2018-08-30 MED ORDER — ESCITALOPRAM OXALATE 10 MG PO TABS: 10.0000 mg | ORAL_TABLET | Freq: Every day | ORAL | 0 refills | Status: AC

## 2018-08-30 MED ORDER — HEPARIN SODIUM (PORCINE) 1000 UNIT/ML DIALYSIS: 1000.0000 [IU] | INTRAMUSCULAR | Status: DC | PRN

## 2018-08-30 MED ORDER — CLINDAMYCIN HCL 300 MG PO CAPS: 300.0000 mg | ORAL_CAPSULE | Freq: Three times a day (TID) | ORAL | 0 refills | Status: DC

## 2018-08-30 MED FILL — traZODone HCL 50 MG TABS: 50 | 30 days supply | Qty: 30 | Fill #0

## 2018-08-30 MED FILL — CLINDAMYCIN HCL 300 MG CAP: 300 | 21 days supply | Qty: 21 | Fill #0

## 2018-08-30 MED FILL — ESCITALOPRAM 10 MG TABLET: 10 | 30 days supply | Qty: 30 | Fill #0

## 2018-08-30 NOTE — Care Management Important Message (Signed)
Important Message  Patient Details  Name: George [name] MRN: [id number] Date of Birth: 1970/09/10   Medicare Important Message Given:  Yes    Domnic Vantol P [name] 08/30/2018, 1:29 PM

## 2018-08-30 NOTE — Clinical Social Work Note (Signed)
Patient going to [address], [place] todaycs. Cab voucher filled out and given to Network engineer.  CSW signing off.  George [name], George [name]

## 2018-08-30 NOTE — Progress Notes (Signed)
Pt is in dialysis and unable to vitals or do assessment

## 2018-08-30 NOTE — Discharge Summary (Signed)
Physician Discharge Summary  Erving Sassano OZH:[id number] DOB: 10-May-1971 DOA: 08/16/2018  PCP: Shelby Mattocks, PA-C  Admit date: 08/16/2018 Discharge date: 08/30/2018  Admitted From: Homeless Disposition:  Same   Recommendations for Outpatient Follow-up:  1. Follow up with PCP in 1 week 2. Follow up with Dr. Mancel Parsons, oral surgeon, for dental caries as well as general dentist  3. Continue HD MWF   Discharge Condition: Stable CODE STATUS: Full Diet recommendation: Renal diet  Brief/Interim Summary: George [name] is a 48 year old with past medical history relevant for ESRD on dialysis on M/W/F, noncompliant, hypertension, seizure disorder, hyperlipidemia who presents with acute hypoxic respiratory failure and hyperkalemia due to missing dialysis for social reasons. He subsequently developed passive suicidal ideation; psych consulted. They initially recommended inpatient psych facility transfer, now cleared from psych standpoint.   Discharge Diagnoses:  Principal Problem:   Acute pulmonary edema (HCC) Active Problems:   Hypoxia   Acute respiratory failure (HCC)   ESRD on peritoneal dialysis ([place])   Essential hypertension   Hyperkalemia   Seizure (HCC)   MDD (major depressive disorder), single episode, moderate (HCC)   MDD (major depressive disorder), single episode, severe , no psychosis ([place])   Acute hypoxemic respiratory failure Resolved with dialysis, now on room air   ESRD with volume overload, pulmonary edema  Patient noted to be noncompliant Continue dialysis MWF  Suicidal ideation Psych consulted, initially was recommended for inpatient psych facility transfer Psych reevaluated patient on 2/25, patient is cleared at this time Continue Lexapro  Hyperlipidemia Continue pravastatin  Hypertension Continue amlodipine, carvedilol, hydralazine  CAD Continue bidil   Seizure disorder Continue Keppra  Dental caries Pain control Panorama revealed dental  caries.  Dr. Enrique Sack (Dental) consulted, commended outpatient follow-up with oral surgeon Dr. Mancel Parsons   Discharge Instructions  Discharge Instructions    Call MD for:  difficulty breathing, headache or visual disturbances   Complete by:  As directed    Call MD for:  extreme fatigue   Complete by:  As directed    Call MD for:  hives   Complete by:  As directed    Call MD for:  persistant dizziness or light-headedness   Complete by:  As directed    Call MD for:  persistant nausea and vomiting   Complete by:  As directed    Call MD for:  severe uncontrolled pain   Complete by:  As directed    Call MD for:  temperature >100.4   Complete by:  As directed    Discharge instructions   Complete by:  As directed    You were cared for by a hospitalist during your hospital stay. If you have any questions about your discharge medications or the care you received while you were in the hospital after you are discharged, you can call the unit and ask to speak with the hospitalist on call if the hospitalist that took care of you is not available. Once you are discharged, your primary care physician will handle any further medical issues. Please note that NO REFILLS for any discharge medications will be authorized once you are discharged, as it is imperative that you return to your primary care physician (or establish a relationship with a primary care physician if you do not have one) for your aftercare needs so that they can reassess your need for medications and monitor your lab values.   Increase activity slowly   Complete by:  As directed      Allergies as of 08/30/2018  Reactions   Lisinopril Shortness Of Breath   Pt's family said he had SOB, Chest pressure , and coughing       Medication List    TAKE these medications   amLODipine 10 MG tablet Commonly known as:  NORVASC Take 1 tablet (10 mg total) by mouth daily.   benzocaine 10 % mucosal gel Commonly known as:  ORAJEL Use as  directed in the mouth or throat 4 (four) times daily as needed for mouth pain.   calcitRIOL 0.25 MCG capsule Commonly known as:  ROCALTROL Take 1 capsule (0.25 mcg total) by mouth Every Tuesday,Thursday,and Saturday with dialysis. What changed:  when to take this   calcium acetate 667 MG capsule Commonly known as:  PHOSLO Take 1,334 mg by mouth 3 (three) times daily with meals.   carvedilol 25 MG tablet Commonly known as:  COREG Take 25 mg by mouth 2 (two) times daily with a meal.   clindamycin 300 MG capsule Commonly known as:  CLEOCIN Take 1 capsule (300 mg total) by mouth 3 (three) times daily for 7 days.   escitalopram 10 MG tablet Commonly known as:  LEXAPRO Take 1 tablet (10 mg total) by mouth daily. Start taking on:  August 31, 2018   isosorbide-hydrALAZINE 20-37.5 MG tablet Commonly known as:  BIDIL Take 1 tablet by mouth 3 (three) times daily.   lanthanum 1000 MG chewable tablet Commonly known as:  FOSRENOL Chew 1 tablet (1,000 mg total) by mouth 3 (three) times daily with meals.   levETIRAcetam 500 MG tablet Commonly known as:  KEPPRA Take 500 mg by mouth 2 (two) times daily.   lidocaine-prilocaine cream Commonly known as:  EMLA Apply 1 application topically as needed (before dialysis.).   multivitamin Tabs tablet Take 1 tablet by mouth daily.   pravastatin 20 MG tablet Commonly known as:  PRAVACHOL Take 20 mg by mouth every evening.   traZODone 50 MG tablet Commonly known as:  DESYREL Take 1 tablet (50 mg total) by mouth at bedtime as needed for sleep.      Follow-up Information    Drab, Larkin Ina, DMD Follow up.   Specialty:  Dentistry Why:  Call for appointment for evaluation for extraction of tooth numbers 1, 15, and 16. Contact information: [place] [address] [phone]        [facility]. Schedule an appointment as soon as possible for a visit.   Why:  Make appointment for follow-up with general dental  care Contact information: [address] [place], [place] [address] [phone]       [place], [name], [place]. Schedule an appointment as soon as possible for a visit in 1 week(s).   Specialties:  Internal Medicine, Emergency Medicine Contact information: [address] [place] [address] [phone]          Allergies  Allergen Reactions  . Lisinopril Shortness Of Breath    Pt's family said he had SOB, Chest pressure , and coughing     Consultations:  Psychiatry  Nephrology  Dentist   Procedures/Studies: Dg Orthopantogram  Result Date: 08/27/2018 CLINICAL DATA:  Left-sided jaw pain EXAM: ORTHOPANTOGRAM/PANORAMIC COMPARISON:  None. FINDINGS: Panoramic view of the mandible demonstrates no acute fracture. Dental caries are noted involving the left second maxillary molar as well as the right third maxillary molar. No periapical abscess is noted. No other focal abnormality is seen. IMPRESSION: Dental caries as described.  No acute abnormality is noted. Electronically Signed   By:  Inez Catalina M.D.   On: 08/27/2018 16:56   Dg Chest 2 View  Result Date: 08/16/2018 CLINICAL DATA:  Shortness of breath. EXAM: CHEST - 2 VIEW COMPARISON:  Radiographs of August 10, 2018. FINDINGS: Stable cardiomegaly. Mild central pulmonary vascular congestion is noted. Atherosclerosis of thoracic aorta is noted. No pneumothorax is noted. Probable mild bibasilar pulmonary edema is noted. Mild right pleural effusion is noted. Bony thorax is unremarkable. IMPRESSION: Stable cardiomegaly with central pulmonary vascular congestion and probable mild bibasilar pulmonary edema. Mild right pleural effusion. Aortic Atherosclerosis (ICD10-I70.0). Electronically Signed   By: Marijo Conception, M.D.   On: 08/16/2018 13:18   Dg Chest 2 View  Result Date: 08/10/2018 CLINICAL DATA:  Acute onset of chest pain and shortness of breath that began last night. End stage renal disease on  hemodialysis. EXAM: CHEST - 2 VIEW COMPARISON:  08/10/2017 and earlier. FINDINGS: Cardiac silhouette markedly enlarged. Mild diffuse interstitial and likely airspace pulmonary edema. Hyperinflated RIGHT lung as noted previously. Chronic RIGHT pleural effusion and/or pleuroparenchymal scarring at the RIGHT base, unchanged. No visible LEFT pleural effusion. Visualized bony thorax intact. IMPRESSION: Mild CHF, with stable marked cardiomegaly and mild diffuse interstitial and likely airspace pulmonary edema. Stable chronic RIGHT pleural effusion and/or pleuroparenchymal scarring at the RIGHT base. Electronically Signed   By: Evangeline Dakin M.D.   On: 08/10/2018 16:42       Discharge Exam: Vitals:   08/30/18 1030 08/30/18 1101  BP: (!) 154/86 (!) 179/99  Pulse: 69 71  Resp:  18  Temp:  97.6 F (36.4 C)  SpO2:  96%    General: Pt is alert, awake, not in acute distress Cardiovascular: RRR, S1/S2 +, no rubs, no gallops Respiratory: CTA bilaterally, no wheezing, no rhonchi Abdominal: Soft, NT, ND, bowel sounds + Extremities: no edema, no cyanosis    The results of significant diagnostics from this hospitalization (including imaging, microbiology, ancillary and laboratory) are listed below for reference.     Microbiology: No results found for this or any previous visit (from the past 240 hour(s)).   Labs: BNP (last 3 results) Recent Labs    08/10/18 1720  BNP 7,253.6*   Basic Metabolic Panel: Recent Labs  Lab 08/25/18 0538 08/25/18 0745 08/26/18 0440 08/27/18 0506 08/28/18 0626 08/29/18 0417 08/30/18 0444  NA 131* 132* 132* 132* 132* 131* 131*  K 4.5 4.2 4.0 4.2 4.3 5.0 5.6*  CL 94* 94* 96* 94* 93* 95* 96*  CO2 24 24 27 24 25 24  20*  GLUCOSE 82 140* 84 75 76 79 80  BUN 64* 63* 40* 58* 73* 86* 101*  CREATININE 10.05* 10.12* 7.40* 9.36* 11.62* 12.85* 15.01*  CALCIUM 8.1* 8.4* 7.8* 7.7* 7.9* 7.7* 7.3*  PHOS 4.4 4.2  --   --   --   --   --    Liver Function Tests: Recent  Labs  Lab 08/25/18 0538 08/25/18 0745  ALBUMIN 3.0* 3.1*   No results for input(s): LIPASE, AMYLASE in the last 168 hours. No results for input(s): AMMONIA in the last 168 hours. CBC: Recent Labs  Lab 08/25/18 0538 08/30/18 0700  WBC 8.5 5.1  HGB 11.1* 10.7*  HCT 33.3* 30.6*  MCV 94.6 93.3  PLT 206 167   Cardiac Enzymes: No results for input(s): CKTOTAL, CKMB, CKMBINDEX, TROPONINI in the last 168 hours. BNP: Invalid input(s): POCBNP CBG: Recent Labs  Lab 08/29/18 1159 08/29/18 1640 08/29/18 2116 08/30/18 0629 08/30/18 1134  GLUCAP 82 124* 109* 71 91  D-Dimer No results for input(s): DDIMER in the last 72 hours. Hgb A1c No results for input(s): HGBA1C in the last 72 hours. Lipid Profile No results for input(s): CHOL, HDL, LDLCALC, TRIG, CHOLHDL, LDLDIRECT in the last 72 hours. Thyroid function studies No results for input(s): TSH, T4TOTAL, T3FREE, THYROIDAB in the last 72 hours.  Invalid input(s): FREET3 Anemia work up No results for input(s): VITAMINB12, FOLATE, FERRITIN, TIBC, IRON, RETICCTPCT in the last 72 hours. Urinalysis No results found for: COLORURINE, APPEARANCEUR, LABSPEC, [place], GLUCOSEU, HGBUR, BILIRUBINUR, KETONESUR, PROTEINUR, UROBILINOGEN, NITRITE, LEUKOCYTESUR Sepsis Labs Invalid input(s): PROCALCITONIN,  WBC,  LACTICIDVEN Microbiology No results found for this or any previous visit (from the past 240 hour(s)).   Patient was seen and examined on the day of discharge and was found to be in stable condition. Time coordinating discharge: 25 minutes including assessment and coordination of care, as well as examination of the patient.   SIGNED:  Dessa Phi, DO [facility] [email or web address] 08/30/2018, 3:54 PM

## 2018-08-30 NOTE — Progress Notes (Signed)
CM talked to patient again about DCP  Patient stated that he is planning to go to [place] to be closer to his family via bus tomorrow; Patient also stated that if he is discharged home today he will find somewhere to stay; He has a list of shelter in the area and he knows how to contact them for availability; he has private insurance with [employer] with prescription drug coverage; Aneta Mins [phone]

## 2018-08-30 NOTE — Progress Notes (Signed)
Subjective: Interval History: has no complaint , hoping for discharge  Objective: Vital signs in last 24 hours: Temp:  [97.3 F (36.3 C)-97.8 F (36.6 C)] 97.6 F (36.4 C) (03/02 0650) Pulse Rate:  [60-69] 69 (03/02 0900) Resp:  [18-20] 18 (03/02 0650) BP: (122-166)/(77-97) 166/94 (03/02 0900) SpO2:  [92 %-96 %] 96 % (03/02 0650) Weight:  [100 kg-100.2 kg] 100.2 kg (03/02 0650) Weight change: 0.816 kg  Intake/Output from previous day: 03/01 0701 - 03/02 0700 In: 600 [P.O.:600] Out: 0  Intake/Output this shift: No intake/output data recorded.  General appearance: alert, cooperative and no distress Resp: rhonchi bibasilar Cardio: S1, S2 normal and systolic murmur: systolic ejection 2/6, crescendo and decrescendo at 2nd left intercostal space GI: obese, pos bs, liver down 6 cm Extremities: AVF LUA   Lab Results: Recent Labs    08/30/18 0700  WBC 5.1  HGB 10.7*  HCT 30.6*  PLT 167   BMET:  Recent Labs    08/29/18 0417 08/30/18 0444  NA 131* 131*  K 5.0 5.6*  CL 95* 96*  CO2 24 20*  GLUCOSE 79 80  BUN 86* 101*  CREATININE 12.85* 15.01*  CALCIUM 7.7* 7.3*   No results for input(s): PTH in the last 72 hours. Iron Studies: No results for input(s): IRON, TIBC, TRANSFERRIN, FERRITIN in the last 72 hours.  Studies/Results: No results found.  I have reviewed the patient's current medications.  [facility] MWF   4.5h    94.5kg  2/2.5 bath  14ga  400/800  Hep none  LUA AVF    Assessment: 1 ESRD - gets OP HD at [facility].  He knows to resume there after dc 2 Anemia stable 3 HPTH vitD 4 HTN acceptable on meds 5 Suicidal behavior - resolved per psych 6 Social 7 Nonadherence 8 Dispo ok for dc from renal standpoint  P -  HD today   LOS: 14 days   Sandy Salaam Charlyne Robertshaw 08/30/2018,9:33 AM

## 2018-08-30 NOTE — Progress Notes (Signed)
PROGRESS NOTE    George [name]  QZE:[id number] DOB: 01/29/1971 DOA: 08/16/2018 PCP: Shelby Mattocks, PA-C     Brief Narrative:  George [name] is a 48 year old with past medical history relevant for ESRD on dialysis on M/W/F, noncompliant, hypertension, seizure disorder, hyperlipidemia who presents with acute hypoxic respiratory failure and hyperkalemia due to missing dialysis for social reasons.  He subsequently developed passive suicidal ideation; psych consulted. They initially recommended inpatient psych facility transfer, now cleared from psych standpoint.   New events last 24 hours / Subjective: Patient seen in dialysis unit.  He states that the [place] helps with dental pain, but continues to have pain especially with chewing food.  Assessment & Plan:   Principal Problem:   Acute pulmonary edema (HCC) Active Problems:   Hypoxia   Acute respiratory failure (HCC)   ESRD on peritoneal dialysis ([place])   Essential hypertension   Hyperkalemia   Seizure (HCC)   MDD (major depressive disorder), single episode, moderate (HCC)   MDD (major depressive disorder), single episode, severe , no psychosis ([place])   Acute hypoxemic respiratory failure Resolved with dialysis, now on room air   ESRD with volume overload, pulmonary edema  Patient noted to be noncompliant Continue dialysis MWF  Suicidal ideation Psych consulted, initially was recommended for inpatient psych facility transfer Psych reevaluated patient on 2/25, patient is cleared at this time Continue Lexapro  Hyperlipidemia Continue pravastatin  Hypertension Continue amlodipine, carvedilol, hydralazine  CAD Continue bidil   Seizure disorder Continue Keppra  Dental caries Pain control Panorama revealed dental caries.  Dr. Enrique Sack (Dental) consulted   DVT prophylaxis: Subq hep Code Status: Full Family Communication: No family at bedside Disposition Plan: Patient homeless. SW/CM provided resources.   Discharge pending dental consultation   Consultants:   Nephrology  Psych  Dental   Procedures:    None   Antimicrobials:  Anti-infectives (From admission, onward)   None       Objective: Vitals:   08/30/18 0930 08/30/18 1000 08/30/18 1030 08/30/18 1101  BP: (!) 174/96 (!) 159/84 (!) 154/86 (!) 179/99  Pulse: 65 70 69 71  Resp:    18  Temp:    97.6 F (36.4 C)  TempSrc:    Oral  SpO2:    96%  Weight:    96.3 kg  Height:        Intake/Output Summary (Last 24 hours) at 08/30/2018 1204 Last data filed at 08/30/2018 1101 Gross per 24 hour  Intake 360 ml  Output 4000 ml  Net -3640 ml   Filed Weights   08/30/18 0524 08/30/18 0650 08/30/18 1101  Weight: 100 kg 100.2 kg 96.3 kg    Examination: General exam: Appears calm and comfortable  Respiratory system: Clear to auscultation. Respiratory effort normal. Cardiovascular system: S1 & S2 heard, RRR. No JVD, murmurs, rubs, gallops or clicks. No pedal edema. Gastrointestinal system: Abdomen is nondistended, soft and nontender. No organomegaly or masses felt. Normal bowel sounds heard. Central nervous system: Alert and oriented. No focal neurological deficits. Extremities: Symmetric 5 x 5 power. Skin: No rashes, lesions or ulcers Psychiatry: Judgement and insight appear stable   Data Reviewed: I have personally reviewed following labs and imaging studies  CBC: Recent Labs  Lab 08/25/18 0538 08/30/18 0700  WBC 8.5 5.1  HGB 11.1* 10.7*  HCT 33.3* 30.6*  MCV 94.6 93.3  PLT 206 226   Basic Metabolic Panel: Recent Labs  Lab 08/25/18 0538 08/25/18 0745 08/26/18 0440 08/27/18 0506 08/28/18 0626 08/29/18 0417 08/30/18  0444  NA 131* 132* 132* 132* 132* 131* 131*  K 4.5 4.2 4.0 4.2 4.3 5.0 5.6*  CL 94* 94* 96* 94* 93* 95* 96*  CO2 24 24 27 24 25 24  20*  GLUCOSE 82 140* 84 75 76 79 80  BUN 64* 63* 40* 58* 73* 86* 101*  CREATININE 10.05* 10.12* 7.40* 9.36* 11.62* 12.85* 15.01*  CALCIUM 8.1* 8.4* 7.8* 7.7*  7.9* 7.7* 7.3*  PHOS 4.4 4.2  --   --   --   --   --    GFR: Estimated Creatinine Clearance: 6.2 mL/min (A) (by C-G formula based on SCr of 15.01 mg/dL (H)). Liver Function Tests: Recent Labs  Lab 08/25/18 0538 08/25/18 0745  ALBUMIN 3.0* 3.1*   No results for input(s): LIPASE, AMYLASE in the last 168 hours. No results for input(s): AMMONIA in the last 168 hours. Coagulation Profile: No results for input(s): INR, PROTIME in the last 168 hours. Cardiac Enzymes: No results for input(s): CKTOTAL, CKMB, CKMBINDEX, TROPONINI in the last 168 hours. BNP (last 3 results) No results for input(s): PROBNP in the last 8760 hours. HbA1C: No results for input(s): HGBA1C in the last 72 hours. CBG: Recent Labs  Lab 08/29/18 1159 08/29/18 1640 08/29/18 2116 08/30/18 0629 08/30/18 1134  GLUCAP 82 124* 109* 71 91   Lipid Profile: No results for input(s): CHOL, HDL, LDLCALC, TRIG, CHOLHDL, LDLDIRECT in the last 72 hours. Thyroid Function Tests: No results for input(s): TSH, T4TOTAL, FREET4, T3FREE, THYROIDAB in the last 72 hours. Anemia Panel: No results for input(s): VITAMINB12, FOLATE, FERRITIN, TIBC, IRON, RETICCTPCT in the last 72 hours. Sepsis Labs: No results for input(s): PROCALCITON, LATICACIDVEN in the last 168 hours.  No results found for this or any previous visit (from the past 240 hour(s)).     Radiology Studies: No results found.    Scheduled Meds: . amLODipine  10 mg Oral QHS  . calcium acetate  1,334 mg Oral TID WC  . carvedilol  25 mg Oral BID WC  . Chlorhexidine Gluconate Cloth  6 each Topical Q0600  . Chlorhexidine Gluconate Cloth  6 each Topical Q0600  . escitalopram  10 mg Oral Daily  . fentaNYL (SUBLIMAZE) injection  12.5 mcg Intravenous Once  . heparin  5,000 Units Subcutaneous Q8H  . isosorbide-hydrALAZINE  1 tablet Oral TID  . lanthanum  1,000 mg Oral TID WC  . levETIRAcetam  500 mg Oral BID  . multivitamin  1 tablet Oral QHS  . pravastatin  20 mg  Oral QPM   Continuous Infusions: . sodium chloride    . sodium chloride       LOS: 14 days    Time spent: 20 minutes   Dessa Phi, DO [facility] [email or web address] 08/30/2018, 12:04 PM

## 2018-08-30 NOTE — Consult Note (Signed)
DENTAL CONSULTATION  Date of Consultation:  08/30/2018 Patient Name:   George [name] Date of Birth:   [birth year] Medical Record Number: [id number]  VITALS: BP (!) 179/99 (BP Location: Right Arm)   Pulse 71   Temp 97.6 F (36.4 C) (Oral)   Resp 18   Ht 5\' 2"  (1.575 m)   Wt 96.3 kg   SpO2 96%   BMI 38.83 kg/m   CHIEF COMPLAINT: Patient referred by Dr. Maylene Roes for dental consultation.  HPI: George [name] is a 48 year old male who was admitted with acute hypoxic respiratory failure and hyperkalemia secondary to noncompliance with dialysis.  Patient subsequently found to have acute toothaches symptoms.  Dental consultation requested to evaluate poor dentition.  Patient currently has a history of toothache symptoms coming from the upper left quadrant since 08/27/2018. Patient describes acute, sharp pain that occurs in an intermittent fashion.  Patient indicates that the pain reached an intensity of 8 out of 10 but is currently 4 out of 10.  Patient indicates that the dental pain is relieved with Orajel.  Patient has not seen a dentist for over 30 years.  Patient denies having dental phobia.  PROBLEM LIST: Patient Active Problem List   Diagnosis Date Noted  . MDD (major depressive disorder), single episode, severe , no psychosis ([place])   . MDD (major depressive disorder), single episode, moderate ([place])   . Hypertensive urgency 08/08/2017  . Elevated troponin 08/08/2017  . Acute pulmonary edema ([place]) 08/08/2017  . Seizure ([place])   . ESRD needing dialysis ([place])   . Type 2 diabetes mellitus with complication ([place])   . Absolute anemia   . Tobacco abuse   . Hemiparesis affecting left side as late effect of stroke ([place])   . Cerebral infarction due to embolism of right middle cerebral artery ([place])   . Cerebral embolism with cerebral infarction 06/22/2015  . Confusion   . ESRD (end stage renal disease) on dialysis ([place])   . Right leg pain   . Gastrointestinal hemorrhage with melena   . Hematemesis  with nausea   . Hematemesis 06/17/2015  . Uremia 06/17/2015  . Hypoxia 06/16/2015  . Acute respiratory failure ([place]) 06/16/2015  . ESRD on peritoneal dialysis ([place]) 06/16/2015  . Diabetes mellitus without complication ([place]) 35/46/5681  . Essential hypertension 06/16/2015  . Pleuritic chest pain 06/16/2015  . Pain in the chest   . Hyperkalemia   . High anion gap metabolic acidosis   . SOB (shortness of breath)     PMH: Past Medical History:  Diagnosis Date  . CHF (congestive heart failure) ([place])   . Diabetes mellitus without complication ([place])   . Hypertension   . Renal disorder   . Seizure ([place])     PSH: Past Surgical History:  Procedure Laterality Date  . AV FISTULA PLACEMENT Left 06/19/2015   Procedure: LEFT ARTERIOVENOUS (AV) FISTULA CREATION;  Surgeon: Elam Dutch, MD;  Location: [place];  Service: Vascular;  Laterality: Left;  . CAPD REMOVAL N/A 06/22/2015   Procedure: CONTINUOUS AMBULATORY PERITONEAL DIALYSIS  (CAPD) CATHETER REMOVAL;  Surgeon: Ralene Ok, MD;  Location: [place];  Service: General;  Laterality: N/A;  . ESOPHAGOGASTRODUODENOSCOPY N/A 06/21/2015   Procedure: ESOPHAGOGASTRODUODENOSCOPY (EGD);  Surgeon: Laurence Spates, MD;  Location[facility] ENDOSCOPY;  Service: Endoscopy;  Laterality: N/A;  . INSERTION OF DIALYSIS CATHETER Left 06/19/2015   Procedure: INSERTION OF DIALYSIS CATHETER LEFT INTERNAL JUGULAR;  Surgeon: Elam Dutch, MD;  Location: [place];  Service: Vascular;  Laterality: Left;  .  peritoneal dialysis catheter placed     around 2015 in [place]: Allergies  Allergen Reactions  . Lisinopril Shortness Of Breath    Pt's family said he had SOB, Chest pressure , and coughing     MEDICATIONS: Current Facility-Administered Medications  Medication Dose Route Frequency Provider Last Rate Last Dose  . 0.9 %  sodium chloride infusion  100 mL Intravenous PRN Deterding, Jeneen Rinks, MD      . 0.9 %  sodium chloride infusion  100 mL  Intravenous PRN Deterding, Jeneen Rinks, MD      . acetaminophen (TYLENOL) tablet 650 mg  650 mg Oral Q6H PRN Arrien, Jimmy Picket, MD       Or  . acetaminophen (TYLENOL) suppository 650 mg  650 mg Rectal Q6H PRN Arrien, Jimmy Picket, MD      . alteplase (CATHFLO ACTIVASE) injection 2 mg  2 mg Intracatheter Once PRN Mauricia Area, MD      . amLODipine (NORVASC) tablet 10 mg  10 mg Oral Nile Riggs, MD   10 mg at 08/29/18 2152  . benzocaine (ORAJEL) 10 % mucosal gel   Mouth/Throat QID PRN Dessa Phi, DO      . calcium acetate (PHOSLO) capsule 1,334 mg  1,334 mg Oral TID WC Arrien, Jimmy Picket, MD   1,334 mg at 08/30/18 1141  . carvedilol (COREG) tablet 25 mg  25 mg Oral BID WC Arrien, Jimmy Picket, MD   25 mg at 08/30/18 1140  . Chlorhexidine Gluconate Cloth 2 % PADS 6 each  6 each Topical Q0600 Roney Jaffe, MD   6 each at 08/25/18 0510  . Chlorhexidine Gluconate Cloth 2 % PADS 6 each  6 each Topical [id number] Mauricia Area, MD   6 each at 08/29/18 [phone]  . escitalopram (LEXAPRO) tablet 10 mg  10 mg Oral Daily Purohit, Shrey C, MD   10 mg at 08/30/18 1140  . fentaNYL (SUBLIMAZE) injection 12.5 mcg  12.5 mcg Intravenous Once Dessa Phi, DO      . heparin injection 1,000 Units  1,000 Units Dialysis PRN Deterding, Jeneen Rinks, MD      . heparin injection 5,000 Units  5,000 Units Subcutaneous Q8H Arrien, Jimmy Picket, MD   5,000 Units at 08/21/18 2045  . heparin injection 9,900 Units  100 Units/kg Dialysis PRN Deterding, Jeneen Rinks, MD      . ipratropium-albuterol (DUONEB) 0.5-2.5 (3) MG/3ML nebulizer solution 3 mL  3 mL Nebulization Q6H PRN Purohit, Shrey C, MD      . isosorbide-hydrALAZINE (BIDIL) 20-37.5 MG per tablet 1 tablet  1 tablet Oral TID Arrien, Jimmy Picket, MD   1 tablet at 08/30/18 1139  . lanthanum (FOSRENOL) chewable tablet 1,000 mg  1,000 mg Oral TID WC Arrien, Jimmy Picket, MD   1,000 mg at 08/30/18 1140  . levETIRAcetam (KEPPRA) tablet 500 mg  500 mg Oral  BID Tawni Millers, MD   500 mg at 08/30/18 1139  . multivitamin (RENA-VIT) tablet 1 tablet  1 tablet Oral QHS Arrien, Jimmy Picket, MD   1 tablet at 08/29/18 2151  . ondansetron (ZOFRAN) tablet 4 mg  4 mg Oral Q6H PRN Arrien, Jimmy Picket, MD       Or  . ondansetron [facility]) injection 4 mg  4 mg Intravenous Q6H PRN Arrien, Jimmy Picket, MD      . pravastatin (PRAVACHOL) tablet 20 mg  20 mg Oral QPM Arrien, Jimmy Picket, MD   20 mg at 08/29/18 1622  . traZODone (  DESYREL) tablet 50 mg  50 mg Oral QHS PRN Thomes Dinning C, MD   50 mg at 08/29/18 2151    LABS: Lab Results  Component Value Date   WBC 5.1 08/30/2018   HGB 10.7 (L) 08/30/2018   HCT 30.6 (L) 08/30/2018   MCV 93.3 08/30/2018   PLT 167 08/30/2018      Component Value Date/Time   NA 131 (L) 08/30/2018 0444   K 5.6 (H) 08/30/2018 0444   CL 96 (L) 08/30/2018 0444   CO2 20 (L) 08/30/2018 0444   GLUCOSE 80 08/30/2018 0444   BUN 101 (H) 08/30/2018 0444   CREATININE 15.01 (H) 08/30/2018 0444   CALCIUM 7.3 (L) 08/30/2018 0444   CALCIUM 7.7 (L) 06/19/2015 1600   GFRNONAA 3 (L) 08/30/2018 0444   GFRAA 4 (L) 08/30/2018 0444   No results found for: INR, PROTIME No results found for: PTT  SOCIAL HISTORY: Social History   Socioeconomic History  . Marital status: Single    Spouse name: Not on file  . Number of children: Not on file  . Years of education: Not on file  . Highest education level: Not on file  Occupational History  . Not on file  Social Needs  . Financial resource strain: Very hard  . Food insecurity:    Worry: Often true    Inability: Often true  . Transportation needs:    Medical: Yes    Non-medical: Yes  Tobacco Use  . Smoking status: Former Smoker    Types: Cigarettes  . Smokeless tobacco: Never Used  Substance and Sexual Activity  . Alcohol use: No  . Drug use: Yes    Types: Marijuana  . Sexual activity: Not on file  Lifestyle  . Physical activity:    Days per week: Not  on file    Minutes per session: Not on file  . Stress: Not on file  Relationships  . Social connections:    Talks on phone: Not on file    Gets together: Not on file    Attends religious service: Not on file    Active member of club or organization: Not on file    Attends meetings of clubs or organizations: Not on file    Relationship status: Not on file  . Intimate partner violence:    Fear of current or ex partner: Not on file    Emotionally abused: Not on file    Physically abused: Not on file    Forced sexual activity: Not on file  Other Topics Concern  . Not on file  Social History Narrative  . Not on file    FAMILY HISTORY: Family History  Problem Relation Age of Onset  . Sudden Cardiac Death Neg Hx     REVIEW OF SYSTEMS: Reviewed with the patient as per History of present illness. Psych: Patient denies having dental phobia.  DENTAL HISTORY: CHIEF COMPLAINT: Patient referred by Dr. Maylene Roes for dental consultation.  HPI: George [name] is a 48 year old male who was admitted with acute hypoxic respiratory failure and hyperkalemia secondary to noncompliance with dialysis.  Patient subsequently found to have acute toothaches symptoms.  Dental consultation requested to evaluate poor dentition.  Patient currently has a history of toothache symptoms coming from the upper left quadrant since 08/27/2018. Patient describes acute, sharp pain that occurs in an intermittent fashion.  Patient indicates that the pain reached an intensity of 8 out of 10 but is currently 4 out of 10.  Patient indicates that  the dental pain is relieved with Orajel.  Patient has not seen a dentist for over 30 years.  Patient denies having dental phobia.  DENTAL EXAMINATION: GENERAL: The patient is a well-developed, well-nourished male in no acute distress. HEAD AND NECK: I do not palpate any neck lymphadenopathy.  Patient denies acute TMJ symptoms. INTRAORAL EXAM: Patient has normal saliva.  I do not see any  evidence of oral abscess formation. DENTITION: The patient is not missing any teeth.  Patient has retained root segment in the area of #15.  Multiple diastemas are noted. PERIODONTAL: The patient has chronic periodontitis with plaque and calculus accumulations, gingival recession, and tooth mobility. DENTAL CARIES/SUBOPTIMAL RESTORATIONS: Dental caries are noted. ENDODONTIC: Patient has a history of acute pulpitis symptoms coming from the upper left quadrant.  There are multiple areas of periapical pathology associated with tooth numbers 1, 15, and 16. CROWN AND BRIDGE: There are no crown or bridge restorations. PROSTHODONTIC: There are no partial dentures. OCCLUSION: The patient has a stable occlusion.  RADIOGRAPHIC INTERPRETATION: Orthopantogram was taken on 08/27/2018. There are no missing teeth.  There is a retained root segment in the area #15.  There is periapical pathology associate with apices of tooth numbers 15 and 16 and possibly #1.  Dental caries are noted.  There is incipient to moderate bone loss noted.  Multiple diastemas are noted.   ASSESSMENTS: 1.  End Stage renal disease with history of dialysis noncompliance 2.  History of acute pulpitis 3.  Retained root segment #15 4.  Chronic apical periodontitis 5.  Dental caries 6.  Chronic periodontitis of bone loss 7.  Accretions 8.  Gingival recession 9.  Tooth mobility 10.  Stable occlusion  PLAN/RECOMMENDATIONS: 1. I discussed the risks, benefits, and complications of various treatment options with the patient in relationship to his medical and dental conditions. We discussed various treatment options to include no treatment, multiple extractions with alveoloplasty, pre-prosthetic surgery as indicated, periodontal therapy, dental restorations, root canal therapy, crown and bridge therapy, implant therapy, and replacement of missing teeth as indicated.  I discussed referral to an oral surgeon for extraction of tooth numbers 1,  15, and 16 and then need for follow-up with a general dentist for continued dental care by a dentist that takes dental Medicaid. The patient currently agrees to proceed with follow-up with an oral surgeon for evaluation for the extraction procedures and then follow-up with a general dentist for continued dental care.  Dr. Mancel Parsons is the oral surgeon on call and an outpatient visit should be able to be coordinated with this recent admission.  Patient can then follow-up with a general dentist that does take Medicaid such as [facility] on [facility]. Dr. Maylene Roes should consider addition of oral Clindamycin 300 mg every 8 hour for 7 days to his medication regimen.  2. Discussion of findings with medical team and coordination of future medical and dental care as needed.    Lenn Cal, DDS

## 2018-09-01 ENCOUNTER — Telehealth: Payer: Self-pay

## 2018-09-01 NOTE — Telephone Encounter (Signed)
Called patient in order to set up hospital follow up visit.  While looking for a date to come in we got disconnected.  I attempted to call him back and there was no answer.  Call went to voicemail.  I left a message for him to call us back in order to get appointment scheduled.

## 2018-09-02 ENCOUNTER — Ambulatory Visit: Payer: Self-pay

## 2018-09-02 DIAGNOSIS — N186 End stage renal disease: Secondary | ICD-10-CM

## 2018-09-02 DIAGNOSIS — I10 Essential (primary) hypertension: Secondary | ICD-10-CM

## 2018-09-02 DIAGNOSIS — Z87898 Personal history of other specified conditions: Secondary | ICD-10-CM

## 2018-09-02 DIAGNOSIS — Z992 Dependence on renal dialysis: Principal | ICD-10-CM

## 2018-09-02 NOTE — Chronic Care Management (AMB) (Signed)
  Chronic Care Management    Clinical Social Work Follow Up Note  09/02/2018 Name: George [name] MRN: [id number] DOB: Jul 05, 1970  George [name] is a 48 y.o. year old male who is a primary care patient of [place], [name], [place].   The patient was referred to this writer for assistance with community resources related to homelessness. The patient was recently discharged after a two week hospital stay. Unsuccessful call to the patient. SW left a HIPAA compliant voice message requesting a return call.    Follow Up Plan: Appointment scheduled for SW follow up with client by phone within the next week.   Daneen Schick, BSW, CDP [facility] Care Management Social Worker [phone]  Total time spent performing care coordination and/or care management activities with the patient by phone or face to face = 5 minutes.

## 2018-09-03 DIAGNOSIS — Z992 Dependence on renal dialysis: Secondary | ICD-10-CM | POA: Diagnosis not present

## 2018-09-03 DIAGNOSIS — Z79899 Other long term (current) drug therapy: Secondary | ICD-10-CM | POA: Diagnosis not present

## 2018-09-03 DIAGNOSIS — N186 End stage renal disease: Secondary | ICD-10-CM | POA: Diagnosis not present

## 2018-09-05 ENCOUNTER — Other Ambulatory Visit: Payer: Self-pay

## 2018-09-05 ENCOUNTER — Inpatient Hospital Stay (HOSPITAL_COMMUNITY)
Admission: EM | Admit: 2018-09-05 | Discharge: 2018-09-16 | DRG: 291 | Disposition: A | Discharge status: Home / Self Care | Payer: Medicare HMO | Attending: Internal Medicine | Admitting: Internal Medicine

## 2018-09-05 DIAGNOSIS — E875 Hyperkalemia: Secondary | ICD-10-CM | POA: Diagnosis present

## 2018-09-05 DIAGNOSIS — G9389 Other specified disorders of brain: Secondary | ICD-10-CM | POA: Diagnosis not present

## 2018-09-05 DIAGNOSIS — I132 Hypertensive heart and chronic kidney disease with heart failure and with stage 5 chronic kidney disease, or end stage renal disease: Secondary | ICD-10-CM | POA: Diagnosis not present

## 2018-09-05 DIAGNOSIS — E1122 Type 2 diabetes mellitus with diabetic chronic kidney disease: Secondary | ICD-10-CM | POA: Diagnosis present

## 2018-09-05 DIAGNOSIS — E785 Hyperlipidemia, unspecified: Secondary | ICD-10-CM | POA: Diagnosis present

## 2018-09-05 DIAGNOSIS — J90 Pleural effusion, not elsewhere classified: Secondary | ICD-10-CM | POA: Diagnosis not present

## 2018-09-05 DIAGNOSIS — R0602 Shortness of breath: Secondary | ICD-10-CM | POA: Diagnosis not present

## 2018-09-05 DIAGNOSIS — I69354 Hemiplegia and hemiparesis following cerebral infarction affecting left non-dominant side: Secondary | ICD-10-CM

## 2018-09-05 DIAGNOSIS — Z590 Homelessness: Secondary | ICD-10-CM

## 2018-09-05 DIAGNOSIS — F321 Major depressive disorder, single episode, moderate: Secondary | ICD-10-CM | POA: Diagnosis present

## 2018-09-05 DIAGNOSIS — R011 Cardiac murmur, unspecified: Secondary | ICD-10-CM | POA: Diagnosis present

## 2018-09-05 DIAGNOSIS — Z9115 Patient's noncompliance with renal dialysis: Secondary | ICD-10-CM

## 2018-09-05 DIAGNOSIS — I5033 Acute on chronic diastolic (congestive) heart failure: Secondary | ICD-10-CM | POA: Diagnosis present

## 2018-09-05 DIAGNOSIS — R51 Headache: Secondary | ICD-10-CM | POA: Diagnosis present

## 2018-09-05 DIAGNOSIS — J9601 Acute respiratory failure with hypoxia: Secondary | ICD-10-CM | POA: Diagnosis present

## 2018-09-05 DIAGNOSIS — E669 Obesity, unspecified: Secondary | ICD-10-CM | POA: Diagnosis present

## 2018-09-05 DIAGNOSIS — D696 Thrombocytopenia, unspecified: Secondary | ICD-10-CM | POA: Diagnosis present

## 2018-09-05 DIAGNOSIS — F329 Major depressive disorder, single episode, unspecified: Secondary | ICD-10-CM | POA: Diagnosis present

## 2018-09-05 DIAGNOSIS — Z888 Allergy status to other drugs, medicaments and biological substances status: Secondary | ICD-10-CM

## 2018-09-05 DIAGNOSIS — Z79899 Other long term (current) drug therapy: Secondary | ICD-10-CM

## 2018-09-05 DIAGNOSIS — N186 End stage renal disease: Secondary | ICD-10-CM

## 2018-09-05 DIAGNOSIS — N2581 Secondary hyperparathyroidism of renal origin: Secondary | ICD-10-CM | POA: Diagnosis present

## 2018-09-05 DIAGNOSIS — G40909 Epilepsy, unspecified, not intractable, without status epilepticus: Secondary | ICD-10-CM | POA: Diagnosis present

## 2018-09-05 DIAGNOSIS — I160 Hypertensive urgency: Secondary | ICD-10-CM | POA: Diagnosis present

## 2018-09-05 DIAGNOSIS — E8770 Fluid overload, unspecified: Secondary | ICD-10-CM | POA: Diagnosis not present

## 2018-09-05 DIAGNOSIS — D631 Anemia in chronic kidney disease: Secondary | ICD-10-CM | POA: Diagnosis present

## 2018-09-05 DIAGNOSIS — Z6830 Body mass index (BMI) 30.0-30.9, adult: Secondary | ICD-10-CM

## 2018-09-05 DIAGNOSIS — Z87891 Personal history of nicotine dependence: Secondary | ICD-10-CM

## 2018-09-05 DIAGNOSIS — R45851 Suicidal ideations: Secondary | ICD-10-CM | POA: Diagnosis not present

## 2018-09-05 DIAGNOSIS — F322 Major depressive disorder, single episode, severe without psychotic features: Secondary | ICD-10-CM | POA: Diagnosis present

## 2018-09-05 DIAGNOSIS — Z992 Dependence on renal dialysis: Secondary | ICD-10-CM

## 2018-09-05 NOTE — ED Triage Notes (Addendum)
Per ems pt is from home and has been having SOB for about 1 hr or so. Pt  Is a dialysis pt and goes m/w/f and missed Wednesday and Friday. No chest pain. Alert oriented x 4. Pt also having a headache at this time

## 2018-09-06 ENCOUNTER — Encounter (HOSPITAL_COMMUNITY): Payer: Self-pay | Admitting: Internal Medicine

## 2018-09-06 ENCOUNTER — Emergency Department (HOSPITAL_COMMUNITY): Payer: Medicare HMO

## 2018-09-06 DIAGNOSIS — J90 Pleural effusion, not elsewhere classified: Secondary | ICD-10-CM | POA: Diagnosis not present

## 2018-09-06 DIAGNOSIS — J9601 Acute respiratory failure with hypoxia: Secondary | ICD-10-CM | POA: Diagnosis not present

## 2018-09-06 DIAGNOSIS — G9389 Other specified disorders of brain: Secondary | ICD-10-CM | POA: Diagnosis not present

## 2018-09-06 DIAGNOSIS — I160 Hypertensive urgency: Secondary | ICD-10-CM | POA: Diagnosis not present

## 2018-09-06 DIAGNOSIS — N186 End stage renal disease: Secondary | ICD-10-CM

## 2018-09-06 DIAGNOSIS — Z992 Dependence on renal dialysis: Secondary | ICD-10-CM | POA: Diagnosis not present

## 2018-09-06 DIAGNOSIS — I69354 Hemiplegia and hemiparesis following cerebral infarction affecting left non-dominant side: Secondary | ICD-10-CM

## 2018-09-06 DIAGNOSIS — E8770 Fluid overload, unspecified: Secondary | ICD-10-CM

## 2018-09-06 LAB — BASIC METABOLIC PANEL
Anion gap: 14 (ref 5–15)
BUN: 50 mg/dL — ABNORMAL HIGH (ref 6–20)
CO2: 24 mmol/L (ref 22–32)
Calcium: 8.5 mg/dL — ABNORMAL LOW (ref 8.9–10.3)
Chloride: 105 mmol/L (ref 98–111)
Creatinine, Ser: 13.17 mg/dL — ABNORMAL HIGH (ref 0.61–1.24)
GFR, EST AFRICAN AMERICAN: 5 mL/min — AB (ref >60)
GFR, EST NON AFRICAN AMERICAN: 4 mL/min — AB (ref >60)
Glucose, Bld: 111 mg/dL — ABNORMAL HIGH (ref 70–99)
Potassium: 4.1 mmol/L (ref 3.5–5.1)
Sodium: 143 mmol/L (ref 135–145)

## 2018-09-06 LAB — GLUCOSE, CAPILLARY
Glucose-Capillary: 83 mg/dL (ref 70–99)
Glucose-Capillary: 86 mg/dL (ref 70–99)
Glucose-Capillary: 94 mg/dL (ref 70–99)

## 2018-09-06 LAB — CBC
HCT: 35.8 % — ABNORMAL LOW (ref 39.0–52.0)
Hemoglobin: 11.9 g/dL — ABNORMAL LOW (ref 13.0–17.0)
MCH: 32.1 pg (ref 26.0–34.0)
MCHC: 33.2 g/dL (ref 30.0–36.0)
MCV: 96.5 fL (ref 80.0–100.0)
NRBC: 0 % (ref 0.0–0.2)
Platelets: 128 10*3/uL — ABNORMAL LOW (ref 150–400)
RBC: 3.71 MIL/uL — ABNORMAL LOW (ref 4.22–5.81)
RDW: 15 % (ref 11.5–15.5)
WBC: 6.1 10*3/uL (ref 4.0–10.5)

## 2018-09-06 LAB — I-STAT TROPONIN, ED: Troponin i, poc: 0.04 ng/mL (ref 0.00–0.08)

## 2018-09-06 LAB — CBG MONITORING, ED: Glucose-Capillary: 70 mg/dL (ref 70–99)

## 2018-09-06 MED ORDER — CLINDAMYCIN HCL 300 MG PO CAPS
300.0000 mg | ORAL_CAPSULE | Freq: Three times a day (TID) | ORAL | Status: AC
  Administered 2018-09-06 (×3): 300 mg via ORAL
  Filled 2018-09-06 (×3): qty 1

## 2018-09-06 MED ORDER — CALCIUM ACETATE (PHOS BINDER) 667 MG PO CAPS
1334.0000 mg | ORAL_CAPSULE | Freq: Three times a day (TID) | ORAL | Status: DC
  Administered 2018-09-06 – 2018-09-14 (×10): 1334 mg via ORAL
  Filled 2018-09-06 (×20): qty 2

## 2018-09-06 MED ORDER — TRAZODONE HCL 50 MG PO TABS
50.0000 mg | ORAL_TABLET | Freq: Every evening | ORAL | Status: DC | PRN
  Administered 2018-09-15: 50 mg via ORAL
  Filled 2018-09-06: qty 1

## 2018-09-06 MED ORDER — AMLODIPINE BESYLATE 10 MG PO TABS
10.0000 mg | ORAL_TABLET | Freq: Every day | ORAL | Status: DC
  Administered 2018-09-06 – 2018-09-16 (×9): 10 mg via ORAL
  Filled 2018-09-06 (×3): qty 1
  Filled 2018-09-06: qty 2
  Filled 2018-09-06 (×7): qty 1

## 2018-09-06 MED ORDER — NITROGLYCERIN 2 % TD OINT
1.0000 [in_us] | TOPICAL_OINTMENT | Freq: Four times a day (QID) | TRANSDERMAL | Status: DC
  Administered 2018-09-06 (×2): 1 [in_us] via TOPICAL
  Filled 2018-09-06: qty 1
  Filled 2018-09-06: qty 30
  Filled 2018-09-06 (×2): qty 1

## 2018-09-06 MED ORDER — CHLORHEXIDINE GLUCONATE CLOTH 2 % EX PADS
6.0000 | MEDICATED_PAD | Freq: Every day | CUTANEOUS | Status: DC
  Administered 2018-09-06: 6 via TOPICAL

## 2018-09-06 MED ORDER — ACETAMINOPHEN 325 MG PO TABS: 650.0000 mg | ORAL_TABLET | Freq: Four times a day (QID) | ORAL | Status: DC | PRN

## 2018-09-06 MED ORDER — RENA-VITE PO TABS
1.0000 | ORAL_TABLET | Freq: Every day | ORAL | Status: DC
  Administered 2018-09-06 – 2018-09-15 (×9): 1 via ORAL
  Filled 2018-09-06 (×10): qty 1

## 2018-09-06 MED ORDER — CARVEDILOL 25 MG PO TABS
25.0000 mg | ORAL_TABLET | Freq: Two times a day (BID) | ORAL | Status: DC
  Administered 2018-09-06 – 2018-09-16 (×18): 25 mg via ORAL
  Filled 2018-09-06 (×15): qty 1
  Filled 2018-09-06: qty 2
  Filled 2018-09-06 (×5): qty 1

## 2018-09-06 MED ORDER — ONDANSETRON HCL 4 MG/2ML IJ SOLN: 4.0000 mg | Freq: Four times a day (QID) | INTRAMUSCULAR | Status: DC | PRN

## 2018-09-06 MED ORDER — CALCITRIOL 0.25 MCG PO CAPS
0.2500 ug | ORAL_CAPSULE | ORAL | Status: DC
  Administered 2018-09-06 – 2018-09-15 (×5): 0.25 ug via ORAL
  Filled 2018-09-06 (×4): qty 1

## 2018-09-06 MED ORDER — ONDANSETRON HCL 4 MG PO TABS
4.0000 mg | ORAL_TABLET | Freq: Four times a day (QID) | ORAL | Status: DC | PRN
  Administered 2018-09-16: 4 mg via ORAL
  Filled 2018-09-06: qty 1

## 2018-09-06 MED ORDER — ESCITALOPRAM OXALATE 10 MG PO TABS
10.0000 mg | ORAL_TABLET | Freq: Every day | ORAL | Status: DC
  Administered 2018-09-06 – 2018-09-16 (×10): 10 mg via ORAL
  Filled 2018-09-06 (×11): qty 1

## 2018-09-06 MED ORDER — LEVETIRACETAM 500 MG PO TABS
500.0000 mg | ORAL_TABLET | Freq: Two times a day (BID) | ORAL | Status: DC
  Administered 2018-09-06 – 2018-09-16 (×19): 500 mg via ORAL
  Filled 2018-09-06 (×21): qty 1

## 2018-09-06 MED ORDER — HEPARIN SODIUM (PORCINE) 5000 UNIT/ML IJ SOLN
5000.0000 [IU] | Freq: Three times a day (TID) | INTRAMUSCULAR | Status: DC
  Filled 2018-09-06 (×8): qty 1

## 2018-09-06 MED ORDER — PRAVASTATIN SODIUM 10 MG PO TABS
20.0000 mg | ORAL_TABLET | Freq: Every evening | ORAL | Status: DC
  Administered 2018-09-06 – 2018-09-15 (×9): 20 mg via ORAL
  Filled 2018-09-06 (×10): qty 2

## 2018-09-06 MED ORDER — ACETAMINOPHEN 650 MG RE SUPP: 650.0000 mg | Freq: Four times a day (QID) | RECTAL | Status: DC | PRN

## 2018-09-06 MED ORDER — HYDRALAZINE HCL 20 MG/ML IJ SOLN
5.0000 mg | INTRAMUSCULAR | Status: DC | PRN
  Administered 2018-09-06: 5 mg via INTRAVENOUS
  Filled 2018-09-06: qty 1

## 2018-09-06 MED ORDER — LANTHANUM CARBONATE 500 MG PO CHEW
1000.0000 mg | CHEWABLE_TABLET | Freq: Three times a day (TID) | ORAL | Status: DC
  Administered 2018-09-06 – 2018-09-12 (×4): 1000 mg via ORAL
  Filled 2018-09-06 (×15): qty 2

## 2018-09-06 MED ORDER — ISOSORB DINITRATE-HYDRALAZINE 20-37.5 MG PO TABS
1.0000 | ORAL_TABLET | Freq: Three times a day (TID) | ORAL | Status: DC
  Administered 2018-09-06 – 2018-09-16 (×27): 1 via ORAL
  Filled 2018-09-06 (×31): qty 1

## 2018-09-06 NOTE — ED Notes (Signed)
Patient transported to X-ray/ct

## 2018-09-06 NOTE — Progress Notes (Addendum)
Patient admitted after midnight. He is 48 yo ESRD on MWF dialysis who has missed dialysis due to "transportation". Presented 09/06/18 with sob secondary to volume. Chest xray reveals vascular congestion with pulmonary pleural effusion. Also with uncontrolled BP.  A/P 1. Acute respiratory failure with hypoxia likely from fluid overload secondary to missing dialysis. Chest xray as noted above. Oxygen level greater than 90 room air this am.  -dialysis per nephrology -oxygen supplementation -may need serial dialysis sessions -nephrology consult -monitor  2. Hypertension. Poor control. Home meds include amlodipine, bidil, coreg.  -will add prn hydralazinde -dialysis will help this as well -monitor  3. Anemia. Related to ESRD. Stable -monitor  4. Hx stroke. -continue statin  5. ESRD. mwf dialysis patient since 2013. Chart indicates hx of non-compliance. Creatine greater than 13. Potassium level 4.1.  Appreciated nephrology assisstance -see abve  Maryelizabeth Rowan Treon Kehl NP

## 2018-09-06 NOTE — ED Notes (Signed)
CBG-[id number] 

## 2018-09-06 NOTE — Consult Note (Signed)
Referring Provider: No ref. provider found Primary Care Physician:  Shelby Mattocks, PA-C Primary Nephrologist:  Dr. Lowanda Foster  Reason for Consultation: Medical management end-stage renal disease, treatment of anemia management of secondary hyperparathyroidism, maintenance of euvolemia  HPI: This is a 68 regimen history of end-stage renal disease currently on dialysis Monday Wednesday Fridays missed 2 of his last sessions of dialysis and presents to the emergency room with respiratory failure shortness of breath.  In the emergency room his chest x-ray shows vascular congestion with pulmonary pleural effusion.  Nephrology has been consulted for maintenance of dialysis  Blood pressure 160/100 pulse 70 temperature 99.1.  Sodium 143 potassium 4.1 chloride 105 CO2 24 BUN 50 creatinine 13.1 glucose 111 hemoglobin 11.9 WBC 6.1 platelets 128   Past Medical History:  Diagnosis Date  . CHF (congestive heart failure) ([place])   . Diabetes mellitus without complication ([place])   . Hypertension   . Renal disorder   . Seizure [facility])     Past Surgical History:  Procedure Laterality Date  . AV FISTULA PLACEMENT Left 06/19/2015   Procedure: LEFT ARTERIOVENOUS (AV) FISTULA CREATION;  Surgeon: Elam Dutch, MD;  Location: [place];  Service: Vascular;  Laterality: Left;  . CAPD REMOVAL N/A 06/22/2015   Procedure: CONTINUOUS AMBULATORY PERITONEAL DIALYSIS  (CAPD) CATHETER REMOVAL;  Surgeon: Ralene Ok, MD;  Location: [place];  Service: General;  Laterality: N/A;  . ESOPHAGOGASTRODUODENOSCOPY N/A 06/21/2015   Procedure: ESOPHAGOGASTRODUODENOSCOPY (EGD);  Surgeon: Laurence Spates, MD;  Location: [facility] ENDOSCOPY;  Service: Endoscopy;  Laterality: N/A;  . INSERTION OF DIALYSIS CATHETER Left 06/19/2015   Procedure: INSERTION OF DIALYSIS CATHETER LEFT INTERNAL JUGULAR;  Surgeon: Elam Dutch, MD;  Location: [place];  Service: Vascular;  Laterality: Left;  . peritoneal dialysis catheter placed      around 2015 in [place]    Prior to Admission medications   Medication Sig Start Date End Date Taking? Authorizing Provider  amLODipine (NORVASC) 10 MG tablet Take 1 tablet (10 mg total) by mouth daily. 07/27/18 07/27/19 Yes [name], [name], PA-C  calcitRIOL (ROCALTROL) 0.25 MCG capsule Take 1 capsule (0.25 mcg total) by mouth Every Tuesday,Thursday,and Saturday with dialysis. Patient taking differently: Take 0.25 mcg by mouth every Monday, Wednesday, and Friday.  06/26/15  Yes Hosie Poisson, MD  calcium acetate (PHOSLO) 667 MG capsule Take 1,334 mg by mouth 3 (three) times daily with meals.   Yes [provider]  carvedilol (COREG) 25 MG tablet Take 25 mg by mouth 2 (two) times daily with a meal.   Yes [provider]  clindamycin (CLEOCIN) 300 MG capsule Take 1 capsule (300 mg total) by mouth 3 (three) times daily for 7 days. 08/30/18 09/06/18 Yes Dessa Phi, DO  escitalopram (LEXAPRO) 10 MG tablet Take 1 tablet (10 mg total) by mouth daily. 08/31/18  Yes Dessa Phi, DO  isosorbide-hydrALAZINE (BIDIL) 20-37.5 MG tablet Take 1 tablet by mouth 3 (three) times daily. 07/27/18  Yes [name], [name], PA-C  lanthanum (FOSRENOL) 1000 MG chewable tablet Chew 1 tablet (1,000 mg total) by mouth 3 (three) times daily with meals. 08/18/17  Yes Regalado, Belkys A, MD  levETIRAcetam (KEPPRA) 500 MG tablet Take 500 mg by mouth 2 (two) times daily.   Yes [provider]  lidocaine-prilocaine (EMLA) cream Apply 1 application topically as needed (before dialysis.).   Yes [provider]  multivitamin (RENA-VIT) TABS tablet Take 1 tablet by mouth daily.   Yes [provider]  pravastatin (PRAVACHOL) 20 MG tablet Take 20  mg by mouth every evening.   Yes [provider]  traZODone (DESYREL) 50 MG tablet Take 1 tablet (50 mg total) by mouth at bedtime as needed for sleep. 08/30/18  Yes Dessa Phi, DO  benzocaine (ORAJEL) 10 % mucosal gel  Use as directed in the mouth or throat 4 (four) times daily as needed for mouth pain. Patient not taking: Reported on 09/06/2018 08/30/18   Dessa Phi, DO    Current Facility-Administered Medications  Medication Dose Route Frequency Provider Last Rate Last Dose  . acetaminophen (TYLENOL) tablet 650 mg  650 mg Oral Q6H PRN Rise Patience, MD       Or  . acetaminophen (TYLENOL) suppository 650 mg  650 mg Rectal Q6H PRN Rise Patience, MD      . amLODipine (NORVASC) tablet 10 mg  10 mg Oral Daily Rise Patience, MD   10 mg at 09/06/18 0846  . calcitRIOL (ROCALTROL) capsule 0.25 mcg  0.25 mcg Oral Q M,W,F Rise Patience, MD   0.25 mcg at 09/06/18 0848  . calcium acetate (PHOSLO) capsule 1,334 mg  1,334 mg Oral TID WC Rise Patience, MD   1,334 mg at 09/06/18 0847  . carvedilol (COREG) tablet 25 mg  25 mg Oral BID WC Rise Patience, MD   25 mg at 09/06/18 0847  . Chlorhexidine Gluconate Cloth 2 % PADS 6 each  6 each Topical Q0600 Roney Jaffe, MD   6 each at 09/06/18 1024  . clindamycin (CLEOCIN) capsule 300 mg  300 mg Oral TID Rise Patience, MD   300 mg at 09/06/18 0847  . escitalopram (LEXAPRO) tablet 10 mg  10 mg Oral Daily Rise Patience, MD   10 mg at 09/06/18 0847  . heparin injection 5,000 Units  5,000 Units Subcutaneous Q8H Rise Patience, MD      . hydrALAZINE (APRESOLINE) injection 5 mg  5 mg Intravenous Q4H PRN Rise Patience, MD   5 mg at 09/06/18 0610  . isosorbide-hydrALAZINE (BIDIL) 20-37.5 MG per tablet 1 tablet  1 tablet Oral TID Rise Patience, MD   1 tablet at 09/06/18 0847  . lanthanum (FOSRENOL) chewable tablet 1,000 mg  1,000 mg Oral TID WC Rise Patience, MD   1,000 mg at 09/06/18 0847  . levETIRAcetam (KEPPRA) tablet 500 mg  500 mg Oral BID Rise Patience, MD   500 mg at 09/06/18 0848  . multivitamin (RENA-VIT) tablet 1 tablet  1 tablet Oral QHS Rise Patience, MD      . nitroGLYCERIN  (NITROGLYN) 2 % ointment 1 inch  1 inch Topical Q6H Rise Patience, MD   1 inch at 09/06/18 0525  . ondansetron (ZOFRAN) tablet 4 mg  4 mg Oral Q6H PRN Rise Patience, MD       Or  . ondansetron [facility]) injection 4 mg  4 mg Intravenous Q6H PRN Rise Patience, MD      . pravastatin (PRAVACHOL) tablet 20 mg  20 mg Oral QPM Rise Patience, MD      . traZODone (DESYREL) tablet 50 mg  50 mg Oral QHS PRN Rise Patience, MD        Allergies as of 09/05/2018 - Review Complete 09/05/2018  Allergen Reaction Noted  . Lisinopril Shortness Of Breath 06/18/2015    Family History  Problem Relation Age of Onset  . Sudden Cardiac Death Neg Hx     Social History   Socioeconomic  History  . Marital status: Single    Spouse name: Not on file  . Number of children: Not on file  . Years of education: Not on file  . Highest education level: Not on file  Occupational History  . Not on file  Social Needs  . Financial resource strain: Very hard  . Food insecurity:    Worry: Often true    Inability: Often true  . Transportation needs:    Medical: Yes    Non-medical: Yes  Tobacco Use  . Smoking status: Former Smoker    Types: Cigarettes  . Smokeless tobacco: Never Used  Substance and Sexual Activity  . Alcohol use: No  . Drug use: Yes    Types: Marijuana  . Sexual activity: Not on file  Lifestyle  . Physical activity:    Days per week: Not on file    Minutes per session: Not on file  . Stress: Not on file  Relationships  . Social connections:    Talks on phone: Not on file    Gets together: Not on file    Attends religious service: Not on file    Active member of club or organization: Not on file    Attends meetings of clubs or organizations: Not on file    Relationship status: Not on file  . Intimate partner violence:    Fear of current or ex partner: Not on file    Emotionally abused: Not on file    Physically abused: Not on file    Forced sexual  activity: Not on file  Other Topics Concern  . Not on file  Social History Narrative  . Not on file    Review of Systems: Gen: Admits to weakness and malaise no fever sweats chills HEENT: No visual complaints, No history of Retinopathy. Normal external appearance No Epistaxis or Sore throat. No sinusitis.   CV: Denies chest pain, angina, palpitations, dyspnea on exertion no lower extremity swelling. Resp: No hemoptysis cough or sputum production,  GI: Denies vomiting blood, jaundice, and fecal incontinence.   Denies dysphagia or odynophagia. GU : Denies urinary burning, blood in urine, urinary frequency, urinary hesitancy, nocturnal urination, and urinary incontinence.  No renal calculi. MS: Denies joint pain, limitation of movement, and swelling, stiffness, low back pain, extremity pain. Denies muscle weakness, cramps, atrophy.  No use of non steroidal antiinflammatory drugs. Derm: Denies rash, itching, dry skin, hives, moles, warts, or unhealing ulcers.  Psych: Denies depression, anxiety, memory loss, suicidal ideation, hallucinations, paranoia, and confusion. Heme: Denies bruising, bleeding, and enlarged lymph nodes. Neuro: No headache.  No diplopia. No dysarthria.  No dysphasia.  No history of CVA.  No Seizures. No paresthesias.  No weakness. Endocrine No DM.  No Thyroid disease.  No Adrenal disease.  Physical Exam: Vital signs in last 24 hours: Temp:  [97.4 F (36.3 C)-99.1 F (37.3 C)] 99.1 F (37.3 C) (03/09 1128) Pulse Rate:  [72-81] 72 (03/09 1128) Resp:  [17-22] 20 (03/09 1128) BP: (162-189)/(92-104) 162/100 (03/09 1128) SpO2:  [90 %-100 %] 94 % (03/09 1128) Weight:  [94.9 kg] 94.9 kg (03/08 2351) Last BM Date: 09/06/18 General:   Well-developed nondistressed Head:  Normocephalic and atraumatic. Eyes:  Sclera clear, no icterus.   Conjunctiva pink. Ears:  Normal auditory acuity. Nose:  No deformity, discharge,  or lesions. Mouth:  No deformity or lesions, dentition  normal. Neck:  Supple; no masses or thyromegaly. JVP mild elevation IJ catheter left Lungs: Diminished breath sounds at bases  Heart:  Regular rate and rhythm; 3/6 systolic murmur, clicks, rubs,  or gallops. Abdomen:  Soft, nontender and nondistended. No masses, hepatosplenomegaly or hernias noted. Normal bowel sounds, without guarding, and without rebound.   Msk:  Symmetrical without gross deformities. Normal posture. Pulses:  No carotid, renal, femoral bruits. DP and PT symmetrical and equal Extremities:  Without clubbing or edema. Neurologic:  Alert and  oriented x4;  grossly normal neurologically. Skin:  Intact without significant lesions or rashes. Cervical Nodes:  No significant cervical adenopathy. Psych:  Alert and cooperative. Normal mood and affect.  Intake/Output from previous day: No intake/output data recorded. Intake/Output this shift: No intake/output data recorded.  Lab Results: Recent Labs    09/06/18 0011  WBC 6.1  HGB 11.9*  HCT 35.8*  PLT 128*   BMET Recent Labs    09/06/18 0011  NA 143  K 4.1  CL 105  CO2 24  GLUCOSE 111*  BUN 50*  CREATININE 13.17*  CALCIUM 8.5*   LFT No results for input(s): PROT, ALBUMIN, AST, ALT, ALKPHOS, BILITOT, BILIDIR, IBILI in the last 72 hours. PT/INR No results for input(s): LABPROT, INR in the last 72 hours. Hepatitis Panel No results for input(s): HEPBSAG, HCVAB, HEPAIGM, HEPBIGM in the last 72 hours.  Studies/Results: Dg Chest 2 View  Result Date: 09/06/2018 CLINICAL DATA:  Dyspnea x1 hour EXAM: CHEST - 2 VIEW COMPARISON:  08/16/2018 FINDINGS: Cardiomegaly with aortic atherosclerosis. Diffuse interstitial prominence consistent with interstitial edema with stable small right pleural effusion is again noted, the degree of interstitial edema has decreased since prior. No new pulmonary consolidation. No pneumothorax. No acute osseous abnormality. IMPRESSION: Cardiomegaly with mild interstitial edema and stable small  right pleural effusion. Electronically Signed   By: Ashley Royalty M.D.   On: 09/06/2018 00:38   Ct Head Wo Contrast  Result Date: 09/06/2018 CLINICAL DATA:  Dyspnea for 1 hour.  Headache. EXAM: CT HEAD WITHOUT CONTRAST TECHNIQUE: Contiguous axial images were obtained from the base of the skull through the vertex without intravenous contrast. COMPARISON:  06/20/2015 head CT and MRI FINDINGS: Brain: Encephalomalacia involving the right frontoparietal lobe from remote infarct as well as remote circumscribed cystic focus possibly representing cystic encephalomalacia in the left occipital lobe are noted. No new focus of hemorrhage, infarction, midline shift or edema. Midline fourth ventricle basal cisterns without effacement. No hydrocephalus. Vascular: No hyperdense vessel sign. Skull: Intact Sinuses/Orbits: Nonacute Other: None IMPRESSION: 1. Chronic microvascular ischemia and stigmata of remote right frontoparietal lobe infarct with cystic encephalomalacia also noted in the left occipital lobe. 2. No acute intracranial abnormality. Electronically Signed   By: Ashley Royalty M.D.   On: 09/06/2018 00:44    Assessment/Plan:  ESRD-usually Monday Wednesday Friday dialysis patient at [facility]  Frequent admissions to the emergency room due to volume overload and missing dialysis treatments  ANEMIA-stable not an issue at this time  MBD-continue on calcitriol and binders  HTN/VOL-volume overload we will plan dialysis today 09/06/2018  ACCESS-IJ catheter    LOS: 0 Sherril Croon @TODAY @12 :12 PM

## 2018-09-06 NOTE — ED Notes (Signed)
Breakfast Tray Ordered. 

## 2018-09-06 NOTE — ED Provider Notes (Addendum)
[facility] EMERGENCY DEPARTMENT Provider Note   CSN: [id number] Arrival date & time: 09/05/18  2347    History   Chief Complaint Chief Complaint  Patient presents with  . Shortness of Breath    HPI George [name] is a 48 y.o. male.     Patient presents to the emergency department by ambulance for shortness of breath.  Patient reports a history of end-stage renal disease, on hemodialysis and congestive heart failure.  His shortness of breath started approximately an hour ago.  He is not experiencing any chest pain.  Patient normally has dialysis Monday Wednesday Friday.  He missed his Wednesday and Friday sessions, so has not had dialysis in a week.  Patient also reports diffuse headache.  Headache has been present for 2 days.  He denies any injury.     Past Medical History:  Diagnosis Date  . CHF (congestive heart failure) ([place])   . Diabetes mellitus without complication ([place])   . Hypertension   . Renal disorder   . Seizure [facility])     Patient Active Problem List   Diagnosis Date Noted  . MDD (major depressive disorder), single episode, severe , no psychosis ([place])   . MDD (major depressive disorder), single episode, moderate ([place])   . Hypertensive urgency 08/08/2017  . Elevated troponin 08/08/2017  . Acute pulmonary edema ([place]) 08/08/2017  . Seizure ([place])   . ESRD needing dialysis ([place])   . Type 2 diabetes mellitus with complication ([place])   . Absolute anemia   . Tobacco abuse   . Hemiparesis affecting left side as late effect of stroke ([place])   . Cerebral infarction due to embolism of right middle cerebral artery ([place])   . Cerebral embolism with cerebral infarction 06/22/2015  . Confusion   . ESRD (end stage renal disease) on dialysis ([place])   . Right leg pain   . Gastrointestinal hemorrhage with melena   . Hematemesis with nausea   . Hematemesis 06/17/2015  . Uremia 06/17/2015  . Hypoxia 06/16/2015  . Acute respiratory failure ([place]) 06/16/2015  .  ESRD on peritoneal dialysis ([place]) 06/16/2015  . Diabetes mellitus without complication ([place]) 01/75/1025  . Essential hypertension 06/16/2015  . Pleuritic chest pain 06/16/2015  . Pain in the chest   . Hyperkalemia   . High anion gap metabolic acidosis   . SOB (shortness of breath)     Past Surgical History:  Procedure Laterality Date  . AV FISTULA PLACEMENT Left 06/19/2015   Procedure: LEFT ARTERIOVENOUS (AV) FISTULA CREATION;  Surgeon: Elam Dutch, MD;  Location: [place];  Service: Vascular;  Laterality: Left;  . CAPD REMOVAL N/A 06/22/2015   Procedure: CONTINUOUS AMBULATORY PERITONEAL DIALYSIS  (CAPD) CATHETER REMOVAL;  Surgeon: Ralene Ok, MD;  Location: [place];  Service: General;  Laterality: N/A;  . ESOPHAGOGASTRODUODENOSCOPY N/A 06/21/2015   Procedure: ESOPHAGOGASTRODUODENOSCOPY (EGD);  Surgeon: Laurence Spates, MD;  Location: [facility] ENDOSCOPY;  Service: Endoscopy;  Laterality: N/A;  . INSERTION OF DIALYSIS CATHETER Left 06/19/2015   Procedure: INSERTION OF DIALYSIS CATHETER LEFT INTERNAL JUGULAR;  Surgeon: Elam Dutch, MD;  Location: [place];  Service: Vascular;  Laterality: Left;  . peritoneal dialysis catheter placed     around 2015 in [facility] Medications    Prior to Admission medications   Medication Sig Start Date End Date Taking? Authorizing Provider  amLODipine (NORVASC) 10 MG tablet Take 1 tablet (10 mg total) by mouth daily. 07/27/18 07/27/19 Yes  [name], [name], PA-C  calcitRIOL (ROCALTROL) 0.25 MCG capsule Take 1 capsule (0.25 mcg total) by mouth Every Tuesday,Thursday,and Saturday with dialysis. Patient taking differently: Take 0.25 mcg by mouth every Monday, Wednesday, and Friday.  06/26/15  Yes Hosie Poisson, MD  calcium acetate (PHOSLO) 667 MG capsule Take 1,334 mg by mouth 3 (three) times daily with meals.   Yes [provider]  carvedilol (COREG) 25 MG tablet Take 25 mg by mouth 2 (two) times daily with a meal.   Yes  [provider]  clindamycin (CLEOCIN) 300 MG capsule Take 1 capsule (300 mg total) by mouth 3 (three) times daily for 7 days. 08/30/18 09/06/18 Yes Dessa Phi, DO  escitalopram (LEXAPRO) 10 MG tablet Take 1 tablet (10 mg total) by mouth daily. 08/31/18  Yes Dessa Phi, DO  isosorbide-hydrALAZINE (BIDIL) 20-37.5 MG tablet Take 1 tablet by mouth 3 (three) times daily. 07/27/18  Yes [name], [name], PA-C  lanthanum (FOSRENOL) 1000 MG chewable tablet Chew 1 tablet (1,000 mg total) by mouth 3 (three) times daily with meals. 08/18/17  Yes Regalado, Belkys A, MD  levETIRAcetam (KEPPRA) 500 MG tablet Take 500 mg by mouth 2 (two) times daily.   Yes [provider]  lidocaine-prilocaine (EMLA) cream Apply 1 application topically as needed (before dialysis.).   Yes [provider]  multivitamin (RENA-VIT) TABS tablet Take 1 tablet by mouth daily.   Yes [provider]  pravastatin (PRAVACHOL) 20 MG tablet Take 20 mg by mouth every evening.   Yes [provider]  traZODone (DESYREL) 50 MG tablet Take 1 tablet (50 mg total) by mouth at bedtime as needed for sleep. 08/30/18  Yes Dessa Phi, DO  benzocaine (ORAJEL) 10 % mucosal gel Use as directed in the mouth or throat 4 (four) times daily as needed for mouth pain. Patient not taking: Reported on 09/06/2018 08/30/18   Dessa Phi, DO    Family History Family History  Problem Relation Age of Onset  . Sudden Cardiac Death Neg Hx     Social History Social History   Tobacco Use  . Smoking status: Former Smoker    Types: Cigarettes  . Smokeless tobacco: Never Used  Substance Use Topics  . Alcohol use: No  . Drug use: Yes    Types: Marijuana     Allergies   Lisinopril   Review of Systems Review of Systems  Respiratory: Positive for shortness of breath.   Neurological: Positive for headaches.  All other systems reviewed and are negative.    Physical Exam Updated Vital Signs BP (!)  184/103   Pulse 78   Temp (!) 97.4 F (36.3 C) (Oral)   Resp 17   Ht 5\' 9"  (1.753 m)   Wt 94.9 kg   SpO2 100%   BMI 30.90 kg/m   Physical Exam Vitals signs and nursing note reviewed.  Constitutional:      General: He is not in acute distress.    Appearance: Normal appearance. He is well-developed.  HENT:     Head: Normocephalic and atraumatic.     Right Ear: Hearing normal.     Left Ear: Hearing normal.     Nose: Nose normal.  Eyes:     Conjunctiva/sclera: Conjunctivae normal.     Pupils: Pupils are equal, round, and reactive to light.  Neck:     Musculoskeletal: Normal range of motion and neck supple.  Cardiovascular:     Rate and Rhythm: Regular rhythm.     Heart sounds: S1  normal and S2 normal. No murmur. No friction rub. No gallop.   Pulmonary:     Effort: Pulmonary effort is normal. No respiratory distress.     Breath sounds: Normal breath sounds.  Chest:     Chest wall: No tenderness.  Abdominal:     General: Bowel sounds are normal.     Palpations: Abdomen is soft.     Tenderness: There is no abdominal tenderness. There is no guarding or rebound. Negative signs include Murphy's sign and McBurney's sign.     Hernia: No hernia is present.  Musculoskeletal: Normal range of motion.  Skin:    General: Skin is warm and dry.     Findings: No rash.  Neurological:     Mental Status: He is alert and oriented to person, place, and time.     GCS: GCS eye subscore is 4. GCS verbal subscore is 5. GCS motor subscore is 6.     Cranial Nerves: No cranial nerve deficit.     Sensory: No sensory deficit.     Coordination: Coordination normal.  Psychiatric:        Speech: Speech normal.        Behavior: Behavior normal.        Thought Content: Thought content normal.      ED Treatments / Results  Labs (all labs ordered are listed, but only abnormal results are displayed) Labs Reviewed  CBC - Abnormal; Notable for the following components:      Result Value   RBC 3.71  (*)    Hemoglobin 11.9 (*)    HCT 35.8 (*)    Platelets 128 (*)    All other components within normal limits  BASIC METABOLIC PANEL - Abnormal; Notable for the following components:   Glucose, Bld 111 (*)    BUN 50 (*)    Creatinine, Ser 13.17 (*)    Calcium 8.5 (*)    GFR calc non Af Amer 4 (*)    GFR calc Af Amer 5 (*)    All other components within normal limits  I-STAT TROPONIN, ED    EKG EKG Interpretation  Date/Time:  Sunday September 05 2018 23:51:01 EDT Ventricular Rate:  74 PR Interval:    QRS Duration: 122 QT Interval:  461 QTC Calculation: 512 R Axis:   -81 Text Interpretation:  Sinus or ectopic atrial rhythm Prolonged PR interval RBBB and LAFB No significant change since last tracing Confirmed by Orpah Greek [phone]) on 09/05/2018 11:56:06 PM   Radiology Dg Chest 2 View  Result Date: 09/06/2018 CLINICAL DATA:  Dyspnea x1 hour EXAM: CHEST - 2 VIEW COMPARISON:  08/16/2018 FINDINGS: Cardiomegaly with aortic atherosclerosis. Diffuse interstitial prominence consistent with interstitial edema with stable small right pleural effusion is again noted, the degree of interstitial edema has decreased since prior. No new pulmonary consolidation. No pneumothorax. No acute osseous abnormality. IMPRESSION: Cardiomegaly with mild interstitial edema and stable small right pleural effusion. Electronically Signed   By: Ashley Royalty M.D.   On: 09/06/2018 00:38   Ct Head Wo Contrast  Result Date: 09/06/2018 CLINICAL DATA:  Dyspnea for 1 hour.  Headache. EXAM: CT HEAD WITHOUT CONTRAST TECHNIQUE: Contiguous axial images were obtained from the base of the skull through the vertex without intravenous contrast. COMPARISON:  06/20/2015 head CT and MRI FINDINGS: Brain: Encephalomalacia involving the right frontoparietal lobe from remote infarct as well as remote circumscribed cystic focus possibly representing cystic encephalomalacia in the left occipital lobe are noted. No new focus  of  hemorrhage, infarction, midline shift or edema. Midline fourth ventricle basal cisterns without effacement. No hydrocephalus. Vascular: No hyperdense vessel sign. Skull: Intact Sinuses/Orbits: Nonacute Other: None IMPRESSION: 1. Chronic microvascular ischemia and stigmata of remote right frontoparietal lobe infarct with cystic encephalomalacia also noted in the left occipital lobe. 2. No acute intracranial abnormality. Electronically Signed   By: Ashley Royalty M.D.   On: 09/06/2018 00:44    Procedures Procedures (including critical care time)  Medications Ordered in ED Medications  nitroGLYCERIN (NITROGLYN) 2 % ointment 1 inch (has no administration in time range)     Initial Impression / Assessment and Plan / ED Course  I have reviewed the triage vital signs and the nursing notes.  Pertinent labs & imaging results that were available during my care of the patient were reviewed by me and considered in my medical decision making (see chart for details).        Patient presents to the emergency department for evaluation of shortness of breath.  Patient has a history of end-stage renal disease, noncompliance with his hemodialysis as well as congestive heart failure.  He has missed his last 2 dialysis sessions.  Patient is assigned to a dialysis center in [place] but is currently residing in [place] and has not been able to get to his dialysis sessions.  He is due for dialysis tomorrow but has no way to get to [place] once again.  Work-up today is consistent with volume overload, no respiratory distress.  Potassium is not elevated.  Addendum: Discussed with Dr. Jonnie Finner, on-call for nephrology.  Would like patient admitted to hospitalist service, will dialyze in the morning.  Final Clinical Impressions(s) / ED Diagnoses   Final diagnoses:  Hypervolemia, unspecified hypervolemia type    ED Discharge Orders    None       Orpah Greek, MD 09/06/18 0124    Orpah Greek, MD 09/06/18 0236

## 2018-09-06 NOTE — H&P (Signed)
History and Physical    [name] JJH:[id number] DOB: 1970/08/05 DOA: 09/05/2018  PCP: Shelby Mattocks, PA-C  Patient coming from: Homeless.  Chief Complaint: Shortness of breath.  HPI: George [name] is a 48 y.o. male with history of ESRD on hemodialysis missed his dialysis last 2 sessions on Wednesday and Friday was discharged home on August 30, 2018 have to be admitted for respiratory failure secondary to fluid overload presents to the ER with complaints of shortness of breath and headache.  Patient states over the last 2 days patient has been having shortness of breath increased on exertion.  Denies any chest pain productive cough fever chills.  Has been compliant with his antihypertensives and other medications.  Headache is global denies any visual symptoms or focal deficits.  ED Course: The ER chest x-ray shows congestion and pleural effusion.  CT head was unremarkable appears nonfocal.  Hydralazine was given for elevated blood pressure.  At the time of my exam patient is headache free.  Nephrology has been consulted for dialysis.  Labs show potassium of 4.1 hemoglobin 11.9 platelets 128.  Review of Systems: As per HPI, rest all negative.   Past Medical History:  Diagnosis Date  . CHF (congestive heart failure) ([place])   . Diabetes mellitus without complication ([place])   . Hypertension   . Renal disorder   . Seizure [facility])     Past Surgical History:  Procedure Laterality Date  . AV FISTULA PLACEMENT Left 06/19/2015   Procedure: LEFT ARTERIOVENOUS (AV) FISTULA CREATION;  Surgeon: Elam Dutch, MD;  Location: [place];  Service: Vascular;  Laterality: Left;  . CAPD REMOVAL N/A 06/22/2015   Procedure: CONTINUOUS AMBULATORY PERITONEAL DIALYSIS  (CAPD) CATHETER REMOVAL;  Surgeon: Ralene Ok, MD;  Location: [place];  Service: General;  Laterality: N/A;  . ESOPHAGOGASTRODUODENOSCOPY N/A 06/21/2015   Procedure: ESOPHAGOGASTRODUODENOSCOPY (EGD);  Surgeon: Laurence Spates, MD;   Location: [facility] ENDOSCOPY;  Service: Endoscopy;  Laterality: N/A;  . INSERTION OF DIALYSIS CATHETER Left 06/19/2015   Procedure: INSERTION OF DIALYSIS CATHETER LEFT INTERNAL JUGULAR;  Surgeon: Elam Dutch, MD;  Location: [place];  Service: Vascular;  Laterality: Left;  . peritoneal dialysis catheter placed     around 2015 in [place]     reports that he has quit smoking. His smoking use included cigarettes. He has never used smokeless tobacco. He reports current drug use. Drug: Marijuana. He reports that he does not drink alcohol.  Allergies  Allergen Reactions  . Lisinopril Shortness Of Breath    Pt's family said he had SOB, Chest pressure , and coughing     Family History  Problem Relation Age of Onset  . Sudden Cardiac Death Neg Hx     Prior to Admission medications   Medication Sig Start Date End Date Taking? Authorizing Provider  amLODipine (NORVASC) 10 MG tablet Take 1 tablet (10 mg total) by mouth daily. 07/27/18 07/27/19 Yes [name], [name], PA-C  calcitRIOL (ROCALTROL) 0.25 MCG capsule Take 1 capsule (0.25 mcg total) by mouth Every Tuesday,Thursday,and Saturday with dialysis. Patient taking differently: Take 0.25 mcg by mouth every Monday, Wednesday, and Friday.  06/26/15  Yes Hosie Poisson, MD  calcium acetate (PHOSLO) 667 MG capsule Take 1,334 mg by mouth 3 (three) times daily with meals.   Yes [provider]  carvedilol (COREG) 25 MG tablet Take 25 mg by mouth 2 (two) times daily with a meal.   Yes [provider]  clindamycin (CLEOCIN) 300 MG capsule Take 1 capsule (300  mg total) by mouth 3 (three) times daily for 7 days. 08/30/18 09/06/18 Yes Dessa Phi, DO  escitalopram (LEXAPRO) 10 MG tablet Take 1 tablet (10 mg total) by mouth daily. 08/31/18  Yes Dessa Phi, DO  isosorbide-hydrALAZINE (BIDIL) 20-37.5 MG tablet Take 1 tablet by mouth 3 (three) times daily. 07/27/18  Yes [name], [name], PA-C  lanthanum (FOSRENOL) 1000 MG  chewable tablet Chew 1 tablet (1,000 mg total) by mouth 3 (three) times daily with meals. 08/18/17  Yes Regalado, Belkys A, MD  levETIRAcetam (KEPPRA) 500 MG tablet Take 500 mg by mouth 2 (two) times daily.   Yes [provider]  lidocaine-prilocaine (EMLA) cream Apply 1 application topically as needed (before dialysis.).   Yes [provider]  multivitamin (RENA-VIT) TABS tablet Take 1 tablet by mouth daily.   Yes [provider]  pravastatin (PRAVACHOL) 20 MG tablet Take 20 mg by mouth every evening.   Yes [provider]  traZODone (DESYREL) 50 MG tablet Take 1 tablet (50 mg total) by mouth at bedtime as needed for sleep. 08/30/18  Yes Dessa Phi, DO  benzocaine (ORAJEL) 10 % mucosal gel Use as directed in the mouth or throat 4 (four) times daily as needed for mouth pain. Patient not taking: Reported on 09/06/2018 08/30/18   Dessa Phi, DO    Physical Exam: Vitals:   09/06/18 0145 09/06/18 0215 09/06/18 0300 09/06/18 0345  BP: (!) 170/92 (!) 167/98 (!) 178/99 (!) 182/99  Pulse: 79 78 78 75  Resp: 20 19 17  (!) 21  Temp:      TempSrc:      SpO2: 90% 97% 90% 97%  Weight:      Height:          Constitutional: Moderately built and nourished. Vitals:   09/06/18 0145 09/06/18 0215 09/06/18 0300 09/06/18 0345  BP: (!) 170/92 (!) 167/98 (!) 178/99 (!) 182/99  Pulse: 79 78 78 75  Resp: 20 19 17  (!) 21  Temp:      TempSrc:      SpO2: 90% 97% 90% 97%  Weight:      Height:       Eyes: Anicteric no pallor. ENMT: No discharge from the ears eyes nose or mouth. Neck: No mass felt.  No neck rigidity. Respiratory: No rhonchi or crepitations. Cardiovascular: S1-S2 heard. Abdomen: Soft nontender bowel sounds present. Musculoskeletal: No edema.  No joint effusion. Skin: No rash. Neurologic: Alert awake oriented to time place and person.  Moves all extremities. Psychiatric: Appears normal.   Labs on Admission: I have personally reviewed following labs  and imaging studies  CBC: Recent Labs  Lab 08/30/18 0700 09/06/18 0011  WBC 5.1 6.1  HGB 10.7* 11.9*  HCT 30.6* 35.8*  MCV 93.3 96.5  PLT 167 361*   Basic Metabolic Panel: Recent Labs  Lab 08/30/18 0444 09/06/18 0011  NA 131* 143  K 5.6* 4.1  CL 96* 105  CO2 20* 24  GLUCOSE 80 111*  BUN 101* 50*  CREATININE 15.01* 13.17*  CALCIUM 7.3* 8.5*   GFR: Estimated Creatinine Clearance: 8 mL/min (A) (by C-G formula based on SCr of 13.17 mg/dL (H)). Liver Function Tests: No results for input(s): AST, ALT, ALKPHOS, BILITOT, PROT, ALBUMIN in the last 168 hours. No results for input(s): LIPASE, AMYLASE in the last 168 hours. No results for input(s): AMMONIA in the last 168 hours. Coagulation Profile: No results for input(s): INR, PROTIME in the last 168 hours. Cardiac Enzymes: No results for input(s): CKTOTAL,  CKMB, CKMBINDEX, TROPONINI in the last 168 hours. BNP (last 3 results) No results for input(s): PROBNP in the last 8760 hours. HbA1C: No results for input(s): HGBA1C in the last 72 hours. CBG: Recent Labs  Lab 08/30/18 0629 08/30/18 1134  GLUCAP 71 91   Lipid Profile: No results for input(s): CHOL, HDL, LDLCALC, TRIG, CHOLHDL, LDLDIRECT in the last 72 hours. Thyroid Function Tests: No results for input(s): TSH, T4TOTAL, FREET4, T3FREE, THYROIDAB in the last 72 hours. Anemia Panel: No results for input(s): VITAMINB12, FOLATE, FERRITIN, TIBC, IRON, RETICCTPCT in the last 72 hours. Urine analysis: No results found for: COLORURINE, APPEARANCEUR, LABSPEC, PHURINE, GLUCOSEU, HGBUR, BILIRUBINUR, KETONESUR, PROTEINUR, UROBILINOGEN, NITRITE, LEUKOCYTESUR Sepsis Labs: @LABRCNTIP (procalcitonin:4,lacticidven:4) )No results found for this or any previous visit (from the past 240 hour(s)).   Radiological Exams on Admission: Dg Chest 2 View  Result Date: 09/06/2018 CLINICAL DATA:  Dyspnea x1 hour EXAM: CHEST - 2 VIEW COMPARISON:  08/16/2018 FINDINGS: Cardiomegaly with aortic  atherosclerosis. Diffuse interstitial prominence consistent with interstitial edema with stable small right pleural effusion is again noted, the degree of interstitial edema has decreased since prior. No new pulmonary consolidation. No pneumothorax. No acute osseous abnormality. IMPRESSION: Cardiomegaly with mild interstitial edema and stable small right pleural effusion. Electronically Signed   By: Ashley Royalty M.D.   On: 09/06/2018 00:38   Ct Head Wo Contrast  Result Date: 09/06/2018 CLINICAL DATA:  Dyspnea for 1 hour.  Headache. EXAM: CT HEAD WITHOUT CONTRAST TECHNIQUE: Contiguous axial images were obtained from the base of the skull through the vertex without intravenous contrast. COMPARISON:  06/20/2015 head CT and MRI FINDINGS: Brain: Encephalomalacia involving the right frontoparietal lobe from remote infarct as well as remote circumscribed cystic focus possibly representing cystic encephalomalacia in the left occipital lobe are noted. No new focus of hemorrhage, infarction, midline shift or edema. Midline fourth ventricle basal cisterns without effacement. No hydrocephalus. Vascular: No hyperdense vessel sign. Skull: Intact Sinuses/Orbits: Nonacute Other: None IMPRESSION: 1. Chronic microvascular ischemia and stigmata of remote right frontoparietal lobe infarct with cystic encephalomalacia also noted in the left occipital lobe. 2. No acute intracranial abnormality. Electronically Signed   By: Ashley Royalty M.D.   On: 09/06/2018 00:44    EKG: Independently reviewed.  Normal sinus rhythm.  Assessment/Plan Principal Problem:   Acute respiratory failure with hypoxia (HCC) Active Problems:   ESRD (end stage renal disease) on dialysis ([facility])   Hemiparesis affecting left side as late effect of stroke ([place])   Hypertensive urgency    1. Acute respiratory failure with hypoxia likely from fluid overload secondary to missing dialysis -nephrology has been consulted for dialysis.  Closely follow respiratory  status. 2. Hypertension uncontrolled which I think will improve with dialysis.  Continue home medications including amlodipine BiDil and Coreg.  PRN IV hydralazine for now. 3. Headache resolved.  Likely from uncontrolled hypertension.  CT head unremarkable. 4. History of stroke on statins. 5. History of seizure on Keppra. 6. Anemia likely from ESRD.  Follow CBC. 7. History of depression on Lexapro.  Denies any suicidal ideation at this time. 8. Mild thrombocytopenia follow CBC.   DVT prophylaxis: Heparin. Code Status: Full code. Family Communication: Discussed with patient. Disposition Plan: Home. Consults called: Nephrology was consulted by ER physician. Admission status: Observation.   Rise Patience MD [facility] Pager [phone].  If 7PM-7AM, please contact night-coverage [email or web address] Password [email or web address]  09/06/2018, 5:10 AM

## 2018-09-06 NOTE — ED Notes (Signed)
ED TO INPATIENT HANDOFF REPORT  ED Nurse Name and Phone #: Callie Fielding [phone] S Name/Age/Gender George [name] 48 y.o. male Room/Bed: 865H/846N  Code Status   Code Status: Full Code  Home/SNF/Other Home Patient oriented to: self Is this baseline? Yes   Triage Complete: Triage complete  Chief Complaint sob  Triage Note Per ems pt is from home and has been having SOB for about 1 hr or so. Pt  Is a dialysis pt and goes m/w/f and missed Wednesday and Friday. No chest pain. Alert oriented x 4. Pt also having a headache at this time   Allergies Allergies  Allergen Reactions  . Lisinopril Shortness Of Breath    Pt's family said he had SOB, Chest pressure , and coughing     Level of Care/Admitting Diagnosis ED Disposition    ED Disposition Condition Comment   Admit  Hospital Area: [place] [[id number]]  Level of Care: Medical Telemetry [104]  I expect the patient will be discharged within 24 hours: No (not a candidate for 5C-Observation unit)  Diagnosis: Acute respiratory failure with hypoxia [facility]) [629528]  Admitting Physician: Rise Patience [phone]  Attending Physician: Rise Patience Lei.Right  PT Class (Do Not Modify): Observation [104]  PT Acc Code (Do Not Modify): Observation [10022]       B Medical/Surgery History Past Medical History:  Diagnosis Date  . CHF (congestive heart failure) ([place])   . Diabetes mellitus without complication ([place])   . Hypertension   . Renal disorder   . Seizure [facility])    Past Surgical History:  Procedure Laterality Date  . AV FISTULA PLACEMENT Left 06/19/2015   Procedure: LEFT ARTERIOVENOUS (AV) FISTULA CREATION;  Surgeon: Elam Dutch, MD;  Location: [place];  Service: Vascular;  Laterality: Left;  . CAPD REMOVAL N/A 06/22/2015   Procedure: CONTINUOUS AMBULATORY PERITONEAL DIALYSIS  (CAPD) CATHETER REMOVAL;  Surgeon: Ralene Ok, MD;  Location: [place];  Service: General;  Laterality: N/A;  .  ESOPHAGOGASTRODUODENOSCOPY N/A 06/21/2015   Procedure: ESOPHAGOGASTRODUODENOSCOPY (EGD);  Surgeon: Laurence Spates, MD;  Location: [facility] ENDOSCOPY;  Service: Endoscopy;  Laterality: N/A;  . INSERTION OF DIALYSIS CATHETER Left 06/19/2015   Procedure: INSERTION OF DIALYSIS CATHETER LEFT INTERNAL JUGULAR;  Surgeon: Elam Dutch, MD;  Location: [place];  Service: Vascular;  Laterality: Left;  . peritoneal dialysis catheter placed     around 2015 in [place]     A IV Location/Drains/Wounds Patient Lines/Drains/Airways Status   Active Line/Drains/Airways    Name:   Placement date:   Placement time:   Site:   Days:   Peripheral IV 09/06/18 Right;Anterior Forearm   09/06/18    0210    Forearm   less than 1   Fistula / Graft Left Upper arm   06/19/15    1327    Upper arm   1175   Hemodialysis Catheter Left Internal jugular Double-lumen   06/19/15    1238    Internal jugular   1175   Incision (Closed) 06/19/15 Chest Left   06/19/15    1315     1175   Incision (Closed) 06/19/15 Arm Left   06/19/15    1315     1175   Incision (Closed) 06/22/15 Abdomen Other (Comment)   06/22/15    0803     1172          Intake/Output Last 24 hours No intake or output data in the 24 hours ending 09/06/18 0554  Labs/Imaging  Results for orders placed or performed during the hospital encounter of 09/05/18 (from the past 48 hour(s))  CBC     Status: Abnormal   Collection Time: 09/06/18 12:11 AM  Result Value Ref Range   WBC 6.1 4.0 - 10.5 K/uL   RBC 3.71 (L) 4.22 - 5.81 MIL/uL   Hemoglobin 11.9 (L) 13.0 - 17.0 g/dL   HCT 35.8 (L) 39.0 - 52.0 %   MCV 96.5 80.0 - 100.0 fL   MCH 32.1 26.0 - 34.0 pg   MCHC 33.2 30.0 - 36.0 g/dL   RDW 15.0 11.5 - 15.5 %   Platelets 128 (L) 150 - 400 K/uL   nRBC 0.0 0.0 - 0.2 %    Comment: Performed at [facility], [address] [place], [place] [address]  Basic metabolic panel     Status: Abnormal   Collection Time: 09/06/18 12:11 AM  Result Value Ref Range   Sodium  143 135 - 145 mmol/L   Potassium 4.1 3.5 - 5.1 mmol/L   Chloride 105 98 - 111 mmol/L   CO2 24 22 - 32 mmol/L   Glucose, Bld 111 (H) 70 - 99 mg/dL   BUN 50 (H) 6 - 20 mg/dL   Creatinine, Ser 13.17 (H) 0.61 - 1.24 mg/dL   Calcium 8.5 (L) 8.9 - 10.3 mg/dL   GFR calc non Af Amer 4 (L) >60 mL/min   GFR calc Af Amer 5 (L) >60 mL/min   Anion gap 14 5 - 15    Comment: Performed at [facility] [address], [place], [place] [address]  I-Stat Troponin, ED (not at [facility])     Status: None   Collection Time: 09/06/18 12:30 AM  Result Value Ref Range   Troponin i, poc 0.04 0.00 - 0.08 ng/mL   Comment 3            Comment: Due to the release kinetics of cTnI, a negative result within the first hours of the onset of symptoms does not rule out myocardial infarction with certainty. If myocardial infarction is still suspected, repeat the test at appropriate intervals.   CBG monitoring, ED     Status: None   Collection Time: 09/06/18  5:32 AM  Result Value Ref Range   Glucose-Capillary 70 70 - 99 mg/dL   Dg Chest 2 View  Result Date: 09/06/2018 CLINICAL DATA:  Dyspnea x1 hour EXAM: CHEST - 2 VIEW COMPARISON:  08/16/2018 FINDINGS: Cardiomegaly with aortic atherosclerosis. Diffuse interstitial prominence consistent with interstitial edema with stable small right pleural effusion is again noted, the degree of interstitial edema has decreased since prior. No new pulmonary consolidation. No pneumothorax. No acute osseous abnormality. IMPRESSION: Cardiomegaly with mild interstitial edema and stable small right pleural effusion. Electronically Signed   By: Ashley Royalty M.D.   On: 09/06/2018 00:38   Ct Head Wo Contrast  Result Date: 09/06/2018 CLINICAL DATA:  Dyspnea for 1 hour.  Headache. EXAM: CT HEAD WITHOUT CONTRAST TECHNIQUE: Contiguous axial images were obtained from the base of the skull through the vertex without intravenous contrast. COMPARISON:  06/20/2015 head CT and MRI FINDINGS: Brain:  Encephalomalacia involving the right frontoparietal lobe from remote infarct as well as remote circumscribed cystic focus possibly representing cystic encephalomalacia in the left occipital lobe are noted. No new focus of hemorrhage, infarction, midline shift or edema. Midline fourth ventricle basal cisterns without effacement. No hydrocephalus. Vascular: No hyperdense vessel sign. Skull: Intact Sinuses/Orbits: Nonacute Other: None IMPRESSION: 1.  Chronic microvascular ischemia and stigmata of remote right frontoparietal lobe infarct with cystic encephalomalacia also noted in the left occipital lobe. 2. No acute intracranial abnormality. Electronically Signed   By: Ashley Royalty M.D.   On: 09/06/2018 00:44    Pending Labs Unresulted Labs (From admission, onward)    Start     Ordered   09/07/18 6962  Basic metabolic panel  Tomorrow morning,   R     09/06/18 0509   09/07/18 0500  CBC  Tomorrow morning,   R     09/06/18 0509   09/06/18 0508  CBC  (heparin)  Once,   R    Comments:  Baseline for heparin therapy IF NOT ALREADY DRAWN.  Notify MD if PLT < 100 K.    09/06/18 0509   09/06/18 0508  Creatinine, serum  (heparin)  Once,   R    Comments:  Baseline for heparin therapy IF NOT ALREADY DRAWN.    09/06/18 0509          Vitals/Pain Today's Vitals   09/06/18 0300 09/06/18 0345 09/06/18 0445 09/06/18 0515  BP: (!) 178/99 (!) 182/99 (!) 169/99 (!) 184/93  Pulse: 78 75 78 76  Resp: 17 (!) 21 17 (!) 22  Temp:      TempSrc:      SpO2: 90% 97% 95% 100%  Weight:      Height:      PainSc:        Isolation Precautions No active isolations  Medications Medications  nitroGLYCERIN (NITROGLYN) 2 % ointment 1 inch (1 inch Topical Given 09/06/18 0525)  clindamycin (CLEOCIN) capsule 300 mg (has no administration in time range)  amLODipine (NORVASC) tablet 10 mg (has no administration in time range)  carvedilol (COREG) tablet 25 mg (has no administration in time range)  isosorbide-hydrALAZINE  (BIDIL) 20-37.5 MG per tablet 1 tablet (has no administration in time range)  pravastatin (PRAVACHOL) tablet 20 mg (has no administration in time range)  escitalopram (LEXAPRO) tablet 10 mg (has no administration in time range)  traZODone (DESYREL) tablet 50 mg (has no administration in time range)  calcitRIOL (ROCALTROL) capsule 0.25 mcg (has no administration in time range)  calcium acetate (PHOSLO) capsule 1,334 mg (has no administration in time range)  lanthanum (FOSRENOL) chewable tablet 1,000 mg (has no administration in time range)  levETIRAcetam (KEPPRA) tablet 500 mg (has no administration in time range)  multivitamin (RENA-VIT) tablet 1 tablet (has no administration in time range)  acetaminophen (TYLENOL) tablet 650 mg (has no administration in time range)    Or  acetaminophen (TYLENOL) suppository 650 mg (has no administration in time range)  ondansetron (ZOFRAN) tablet 4 mg (has no administration in time range)    Or  ondansetron (ZOFRAN) injection 4 mg (has no administration in time range)  heparin injection 5,000 Units (5,000 Units Subcutaneous Not Given 09/06/18 0525)  hydrALAZINE (APRESOLINE) injection 5 mg (has no administration in time range)    Mobility walks Low fall risk   Focused Assessments Cardiac Assessment Handoff:    Lab Results  Component Value Date   TROPONINI 0.15 ([place]) 08/08/2017   No results found for: DDIMER Does the Patient currently have chest pain? No     R Recommendations: See Admitting Provider Note  Report given to:   Additional Notes: Pt is alert oriented x 4

## 2018-09-07 ENCOUNTER — Telehealth: Payer: Self-pay

## 2018-09-07 DIAGNOSIS — D631 Anemia in chronic kidney disease: Secondary | ICD-10-CM | POA: Diagnosis not present

## 2018-09-07 DIAGNOSIS — N2581 Secondary hyperparathyroidism of renal origin: Secondary | ICD-10-CM | POA: Diagnosis not present

## 2018-09-07 DIAGNOSIS — E8770 Fluid overload, unspecified: Secondary | ICD-10-CM | POA: Diagnosis not present

## 2018-09-07 DIAGNOSIS — F329 Major depressive disorder, single episode, unspecified: Secondary | ICD-10-CM | POA: Diagnosis present

## 2018-09-07 DIAGNOSIS — R45851 Suicidal ideations: Secondary | ICD-10-CM | POA: Diagnosis not present

## 2018-09-07 DIAGNOSIS — I132 Hypertensive heart and chronic kidney disease with heart failure and with stage 5 chronic kidney disease, or end stage renal disease: Secondary | ICD-10-CM | POA: Diagnosis not present

## 2018-09-07 DIAGNOSIS — F322 Major depressive disorder, single episode, severe without psychotic features: Secondary | ICD-10-CM | POA: Diagnosis not present

## 2018-09-07 DIAGNOSIS — R011 Cardiac murmur, unspecified: Secondary | ICD-10-CM | POA: Diagnosis present

## 2018-09-07 DIAGNOSIS — T1491XA Suicide attempt, initial encounter: Secondary | ICD-10-CM | POA: Diagnosis not present

## 2018-09-07 DIAGNOSIS — R0602 Shortness of breath: Secondary | ICD-10-CM | POA: Diagnosis not present

## 2018-09-07 DIAGNOSIS — Z9115 Patient's noncompliance with renal dialysis: Secondary | ICD-10-CM | POA: Diagnosis not present

## 2018-09-07 DIAGNOSIS — Z87891 Personal history of nicotine dependence: Secondary | ICD-10-CM | POA: Diagnosis not present

## 2018-09-07 DIAGNOSIS — F321 Major depressive disorder, single episode, moderate: Secondary | ICD-10-CM | POA: Diagnosis not present

## 2018-09-07 DIAGNOSIS — E785 Hyperlipidemia, unspecified: Secondary | ICD-10-CM | POA: Diagnosis present

## 2018-09-07 DIAGNOSIS — E1122 Type 2 diabetes mellitus with diabetic chronic kidney disease: Secondary | ICD-10-CM | POA: Diagnosis not present

## 2018-09-07 DIAGNOSIS — I120 Hypertensive chronic kidney disease with stage 5 chronic kidney disease or end stage renal disease: Secondary | ICD-10-CM | POA: Diagnosis not present

## 2018-09-07 DIAGNOSIS — Z590 Homelessness: Secondary | ICD-10-CM | POA: Diagnosis not present

## 2018-09-07 DIAGNOSIS — Z6830 Body mass index (BMI) 30.0-30.9, adult: Secondary | ICD-10-CM | POA: Diagnosis not present

## 2018-09-07 DIAGNOSIS — I69354 Hemiplegia and hemiparesis following cerebral infarction affecting left non-dominant side: Secondary | ICD-10-CM | POA: Diagnosis not present

## 2018-09-07 DIAGNOSIS — I160 Hypertensive urgency: Secondary | ICD-10-CM | POA: Diagnosis not present

## 2018-09-07 DIAGNOSIS — N186 End stage renal disease: Secondary | ICD-10-CM | POA: Diagnosis not present

## 2018-09-07 DIAGNOSIS — R69 Illness, unspecified: Secondary | ICD-10-CM | POA: Diagnosis not present

## 2018-09-07 DIAGNOSIS — G40909 Epilepsy, unspecified, not intractable, without status epilepticus: Secondary | ICD-10-CM | POA: Diagnosis present

## 2018-09-07 DIAGNOSIS — Z992 Dependence on renal dialysis: Secondary | ICD-10-CM | POA: Diagnosis not present

## 2018-09-07 DIAGNOSIS — I5033 Acute on chronic diastolic (congestive) heart failure: Secondary | ICD-10-CM | POA: Diagnosis not present

## 2018-09-07 DIAGNOSIS — J9601 Acute respiratory failure with hypoxia: Secondary | ICD-10-CM | POA: Diagnosis not present

## 2018-09-07 DIAGNOSIS — E669 Obesity, unspecified: Secondary | ICD-10-CM | POA: Diagnosis present

## 2018-09-07 DIAGNOSIS — Z888 Allergy status to other drugs, medicaments and biological substances status: Secondary | ICD-10-CM | POA: Diagnosis not present

## 2018-09-07 DIAGNOSIS — E875 Hyperkalemia: Secondary | ICD-10-CM | POA: Diagnosis present

## 2018-09-07 DIAGNOSIS — D696 Thrombocytopenia, unspecified: Secondary | ICD-10-CM | POA: Diagnosis present

## 2018-09-07 DIAGNOSIS — R51 Headache: Secondary | ICD-10-CM | POA: Diagnosis present

## 2018-09-07 LAB — BASIC METABOLIC PANEL
Anion gap: 9 (ref 5–15)
BUN: 20 mg/dL (ref 6–20)
CO2: 26 mmol/L (ref 22–32)
Calcium: 7.8 mg/dL — ABNORMAL LOW (ref 8.9–10.3)
Chloride: 103 mmol/L (ref 98–111)
Creatinine, Ser: 7.61 mg/dL — ABNORMAL HIGH (ref 0.61–1.24)
GFR calc Af Amer: 9 mL/min — ABNORMAL LOW (ref >60)
GFR calc non Af Amer: 8 mL/min — ABNORMAL LOW (ref >60)
Glucose, Bld: 77 mg/dL (ref 70–99)
Potassium: 4.4 mmol/L (ref 3.5–5.1)
Sodium: 138 mmol/L (ref 135–145)

## 2018-09-07 LAB — GASTROINTESTINAL PANEL BY PCR, STOOL (REPLACES STOOL CULTURE)

## 2018-09-07 LAB — CBC
HCT: 28.6 % — ABNORMAL LOW (ref 39.0–52.0)
Hemoglobin: 9.8 g/dL — ABNORMAL LOW (ref 13.0–17.0)
MCH: 32.6 pg (ref 26.0–34.0)
MCHC: 34.3 g/dL (ref 30.0–36.0)
MCV: 95 fL (ref 80.0–100.0)
NRBC: 0 % (ref 0.0–0.2)
PLATELETS: 102 10*3/uL — AB (ref 150–400)
RBC: 3.01 MIL/uL — ABNORMAL LOW (ref 4.22–5.81)
RDW: 15 % (ref 11.5–15.5)
WBC: 4.9 10*3/uL (ref 4.0–10.5)

## 2018-09-07 LAB — GLUCOSE, CAPILLARY
Glucose-Capillary: 75 mg/dL (ref 70–99)
Glucose-Capillary: 97 mg/dL (ref 70–99)

## 2018-09-07 MED ORDER — CHLORHEXIDINE GLUCONATE CLOTH 2 % EX PADS: 6.0000 | MEDICATED_PAD | Freq: Every day | CUTANEOUS | Status: DC

## 2018-09-07 NOTE — Progress Notes (Signed)
rn went into room to complete discharge process and patient had tied blanket around neck "like a noose" and said he wanted to hang himself.   Sitting on side of bed alert oriented x3.   A/P.  Suicide ideation?Marland Kitchen Hx of same. Chart review indicates patient had same on several other hospitalizations. Evaluated last month my [place] who recommended OP follow up and lexapro. Patient did not follow up -suicide precautions -BH consult -lexapro (patient has been refusing meds)  ESRD. He is [facility] dialysis. Due for dialysis tomorrow. Orders already written -dialysis tomorrow -appreciate nephrology assistance  Hypertension.  -continue amlodipine -bidil -coreg -monitor   Santiago Glad m. Zeno Hickel, NP

## 2018-09-07 NOTE — Progress Notes (Signed)
RN spoke with patient asking patient to change into purple scrubs. Pt refused to change and states he will not. RN has call security. And informed the provider.

## 2018-09-07 NOTE — Progress Notes (Signed)
[facility] , facility stated they have received paperwork. [employer] called and is on the way to serve patient.

## 2018-09-07 NOTE — Progress Notes (Signed)
Pt has four bag of belonging bags with him. Will transport belongings with sitter. Nurse attempted to inventory valuables that was at bedside but patient refused and stated the security let him keep it. This nurse was not present when patient was being wanded and was not aware of this statement  The patient claim was said by security. Three copies of the IVC paperwork (original affidavit and Exam and recommendation) was made and placed in chart.

## 2018-09-07 NOTE — Care Management Obs Status (Signed)
[place] NOTIFICATION   Patient Details  Name: Kazumi Lachney MRN: [id number] Date of Birth: 12-24-1970   Medicare Observation Status Notification Given:  Yes    Midge Minium RN, BSN, NCM-BC, ACM-RN [phone] 09/07/2018, 1:37 PM

## 2018-09-07 NOTE — Progress Notes (Signed)
Nurse asked patient to remove pants as we were getting his scrub pants, and to put his tele box back on, he refused and stated that he is not taking his f pants off and just wants to go home. Camera operator and Provider informed.

## 2018-09-07 NOTE — Progress Notes (Signed)
Patient had discharge orders so nurse went to ask patient if he had a ride home, he stated no. Nurse asked Charge nurse what we could do for patient. Went back to ask patient if he lived in [place] and could take a bus and he had tied his blanket around his neck. Nurse quickly removed blanket and called for help. Charge nurse asked patient if he had thoughts of killing himself and he stated yes

## 2018-09-07 NOTE — Consult Note (Signed)
[facility] Consult   Reason for Consult:  Suicide attempt  Referring Physician:  Dr. Tamala Julian Patient Identification: George [name] MRN:  [id number] Principal Diagnosis: MDD (major depressive disorder), single episode, severe , no psychosis ([place]) Diagnosis:  Principal Problem:   Acute respiratory failure with hypoxia ([place]) Active Problems:   ESRD (end stage renal disease) on dialysis ([place])   Hemiparesis affecting left side as late effect of stroke ([place])   Hypertensive urgency   Total Time spent with patient: 1 hour  Subjective:   George [name] is a 48 y.o. male patient admitted with acute respiratory failure with hypoxia likely from fluid overload secondary to missing dialysis.  HPI:   Per chart review, patient was last seen by the psychiatry service on 2/25. He was initially seen on 2/19 for SI. He was started on Lexapro and Trazodone and psychiatrically cleared with a plan to follow up with outpatient mental health resources. Psychiatry is consulted today because at planned discharge patient tied his blanket around his neck in the form of a noose. He endorsed SI and reported that he wanted to hang himself.   On interview, Mr. Davee reports, "I don't want to live anymore." He reports a lack of family support in [place]. He planned to return to [facility] after his recent hospitalization but he was convinced by his family to stay in the area. He reports, "I just want to get the fuck out of here." He does not believe that he is getting the mental health care that he needs. He reports, "If I was a cocaine addict then I would be given the treatment for that but I don't use drugs." He endorsees current SI. He denies HI or AVH. He reports poor appetite and sleep. He reports that Trazodone is effective for sleep.   Past Psychiatric History: MDD  Risk to Self:  Yes. Recent suicidal gesture by wrapping a blanket around his neck.  Risk to Others:  None. Denies HI.  Prior Inpatient  Therapy:  Denies  Prior Outpatient Therapy:  Denies   Past Medical History:  Past Medical History:  Diagnosis Date  . CHF (congestive heart failure) ([place])   . Diabetes mellitus without complication ([place])   . Hypertension   . Renal disorder   . Seizure [facility])     Past Surgical History:  Procedure Laterality Date  . AV FISTULA PLACEMENT Left 06/19/2015   Procedure: LEFT ARTERIOVENOUS (AV) FISTULA CREATION;  Surgeon: Elam Dutch, MD;  Location: [facility];  Service: Vascular;  Laterality: Left;  . CAPD REMOVAL N/A 06/22/2015   Procedure: CONTINUOUS AMBULATORY PERITONEAL DIALYSIS  (CAPD) CATHETER REMOVAL;  Surgeon: Ralene Ok, MD;  Location: [facility];  Service: General;  Laterality: N/A;  . ESOPHAGOGASTRODUODENOSCOPY N/A 06/21/2015   Procedure: ESOPHAGOGASTRODUODENOSCOPY (EGD);  Surgeon: Laurence Spates, MD;  Location: [facility] ENDOSCOPY;  Service: Endoscopy;  Laterality: N/A;  . INSERTION OF DIALYSIS CATHETER Left 06/19/2015   Procedure: INSERTION OF DIALYSIS CATHETER LEFT INTERNAL JUGULAR;  Surgeon: Elam Dutch, MD;  Location: [facility];  Service: Vascular;  Laterality: Left;  . peritoneal dialysis catheter placed     around 2015 in [place]   Family History:  Family History  Problem Relation Age of Onset  . Sudden Cardiac Death Neg Hx    Family Psychiatric  History: Denies  Social History:  Social History   Substance and Sexual Activity  Alcohol Use No     Social History   Substance and Sexual Activity  Drug Use Yes  .  Types: Marijuana    Social History   Socioeconomic History  . Marital status: Single    Spouse name: Not on file  . Number of children: Not on file  . Years of education: Not on file  . Highest education level: Not on file  Occupational History  . Not on file  Social Needs  . Financial resource strain: Very hard  . Food insecurity:    Worry: Often true    Inability: Often true  . Transportation needs:    Medical: Yes    Non-medical: Yes  Tobacco  Use  . Smoking status: Former Smoker    Types: Cigarettes  . Smokeless tobacco: Never Used  Substance and Sexual Activity  . Alcohol use: No  . Drug use: Yes    Types: Marijuana  . Sexual activity: Not on file  Lifestyle  . Physical activity:    Days per week: Not on file    Minutes per session: Not on file  . Stress: Not on file  Relationships  . Social connections:    Talks on phone: Not on file    Gets together: Not on file    Attends religious service: Not on file    Active member of club or organization: Not on file    Attends meetings of clubs or organizations: Not on file    Relationship status: Not on file  Other Topics Concern  . Not on file  Social History Narrative  . Not on file   Additional Social History: He is homeless. He is from [place]. He is divorced x 1. He has 12 children from 81-26 y/o. He previously worked as a Training and development officer in 2016. He denies alcohol or illicit substance use.      Allergies:   Allergies  Allergen Reactions  . Lisinopril Shortness Of Breath    Pt's family said he had SOB, Chest pressure , and coughing     Labs:  Results for orders placed or performed during the hospital encounter of 09/05/18 (from the past 48 hour(s))  CBC     Status: Abnormal   Collection Time: 09/06/18 12:11 AM  Result Value Ref Range   WBC 6.1 4.0 - 10.5 K/uL   RBC 3.71 (L) 4.22 - 5.81 MIL/uL   Hemoglobin 11.9 (L) 13.0 - 17.0 g/dL   HCT 35.8 (L) 39.0 - 52.0 %   MCV 96.5 80.0 - 100.0 fL   MCH 32.1 26.0 - 34.0 pg   MCHC 33.2 30.0 - 36.0 g/dL   RDW 15.0 11.5 - 15.5 %   Platelets 128 (L) 150 - 400 K/uL   nRBC 0.0 0.0 - 0.2 %    Comment: Performed at [facility], [address] [place] [address], [place], [place] [address]  Basic metabolic panel     Status: Abnormal   Collection Time: 09/06/18 12:11 AM  Result Value Ref Range   Sodium 143 135 - 145 mmol/L   Potassium 4.1 3.5 - 5.1 mmol/L   Chloride 105 98 - 111 mmol/L   CO2 24 22 - 32 mmol/L   Glucose, Bld 111 (H) 70 -  99 mg/dL   BUN 50 (H) 6 - 20 mg/dL   Creatinine, Ser 13.17 (H) 0.61 - 1.24 mg/dL   Calcium 8.5 (L) 8.9 - 10.3 mg/dL   GFR calc non Af Amer 4 (L) >60 mL/min   GFR calc Af Amer 5 (L) >60 mL/min   Anion gap 14 5 - 15    Comment: Performed at [facility], [place] [address], [place], [place] [address]  I-Stat Troponin, ED (not at [facility])     Status: None   Collection Time: 09/06/18 12:30 AM  Result Value Ref Range   Troponin i, poc 0.04 0.00 - 0.08 ng/mL   Comment 3            Comment: Due to the release kinetics of cTnI, a negative result within the first hours of the onset of symptoms does not rule out myocardial infarction with certainty. If myocardial infarction is still suspected, repeat the test at appropriate intervals.   CBG monitoring, ED     Status: None   Collection Time: 09/06/18  5:32 AM  Result Value Ref Range   Glucose-Capillary 70 70 - 99 mg/dL  Glucose, capillary     Status: None   Collection Time: 09/06/18 11:09 AM  Result Value Ref Range   Glucose-Capillary 83 70 - 99 mg/dL  Glucose, capillary     Status: None   Collection Time: 09/06/18 11:10 AM  Result Value Ref Range   Glucose-Capillary 86 70 - 99 mg/dL  Glucose, capillary     Status: None   Collection Time: 09/06/18  9:31 PM  Result Value Ref Range   Glucose-Capillary 94 70 - 99 mg/dL  Basic metabolic panel     Status: Abnormal   Collection Time: 09/07/18  5:46 AM  Result Value Ref Range   Sodium 138 135 - 145 mmol/L   Potassium 4.4 3.5 - 5.1 mmol/L   Chloride 103 98 - 111 mmol/L   CO2 26 22 - 32 mmol/L   Glucose, Bld 77 70 - 99 mg/dL   BUN 20 6 - 20 mg/dL   Creatinine, Ser 7.61 (H) 0.61 - 1.24 mg/dL    Comment: DELTA CHECK NOTED DIALYSIS    Calcium 7.8 (L) 8.9 - 10.3 mg/dL   GFR calc non Af Amer 8 (L) >60 mL/min   GFR calc Af Amer 9 (L) >60 mL/min   Anion gap 9 5 - 15    Comment: Performed at [facility], [place] [address] [place], [place] [address]  CBC     Status: Abnormal   Collection  Time: 09/07/18  5:46 AM  Result Value Ref Range   WBC 4.9 4.0 - 10.5 K/uL   RBC 3.01 (L) 4.22 - 5.81 MIL/uL   Hemoglobin 9.8 (L) 13.0 - 17.0 g/dL   HCT 28.6 (L) 39.0 - 52.0 %   MCV 95.0 80.0 - 100.0 fL   MCH 32.6 26.0 - 34.0 pg   MCHC 34.3 30.0 - 36.0 g/dL   RDW 15.0 11.5 - 15.5 %   Platelets 102 (L) 150 - 400 K/uL    Comment: REPEATED TO VERIFY PLATELET COUNT CONFIRMED BY SMEAR Immature Platelet Fraction may be clinically indicated, consider ordering this additional test [id number]    nRBC 0.0 0.0 - 0.2 %    Comment: Performed at [facility], [place] [address] [place], [place] [address]  Glucose, capillary     Status: None   Collection Time: 09/07/18  6:50 AM  Result Value Ref Range   Glucose-Capillary 75 70 - 99 mg/dL    Current Facility-Administered Medications  Medication Dose Route Frequency Provider Last Rate Last Dose  . acetaminophen (TYLENOL) tablet 650 mg  650 mg Oral Q6H PRN Rise Patience, MD       Or  . acetaminophen (TYLENOL) suppository 650 mg  650 mg Rectal Q6H PRN Gean Birchwood  N, MD      . amLODipine (NORVASC) tablet 10 mg  10 mg Oral Daily Rise Patience, MD   10 mg at 09/06/18 0846  . calcitRIOL (ROCALTROL) capsule 0.25 mcg  0.25 mcg Oral Q M,W,F Rise Patience, MD   0.25 mcg at 09/06/18 0848  . calcium acetate (PHOSLO) capsule 1,334 mg  1,334 mg Oral TID WC Rise Patience, MD   1,334 mg at 09/06/18 0847  . carvedilol (COREG) tablet 25 mg  25 mg Oral BID WC Rise Patience, MD   25 mg at 09/06/18 1856  . Chlorhexidine Gluconate Cloth 2 % PADS 6 each  6 each Topical Q0600 Roney Jaffe, MD   6 each at 09/06/18 1024  . Chlorhexidine Gluconate Cloth 2 % PADS 6 each  6 each Topical Q0600 Edrick Oh, MD      . escitalopram (LEXAPRO) tablet 10 mg  10 mg Oral Daily Rise Patience, MD   10 mg at 09/06/18 0847  . heparin injection 5,000 Units  5,000 Units Subcutaneous Q8H Rise Patience, MD      . hydrALAZINE  (APRESOLINE) injection 5 mg  5 mg Intravenous Q4H PRN Rise Patience, MD   5 mg at 09/06/18 0610  . isosorbide-hydrALAZINE (BIDIL) 20-37.5 MG per tablet 1 tablet  1 tablet Oral TID Rise Patience, MD   1 tablet at 09/06/18 2120  . lanthanum (FOSRENOL) chewable tablet 1,000 mg  1,000 mg Oral TID WC Rise Patience, MD   1,000 mg at 09/06/18 0847  . levETIRAcetam (KEPPRA) tablet 500 mg  500 mg Oral BID Rise Patience, MD   500 mg at 09/06/18 2120  . multivitamin (RENA-VIT) tablet 1 tablet  1 tablet Oral QHS Rise Patience, MD   1 tablet at 09/06/18 2120  . nitroGLYCERIN (NITROGLYN) 2 % ointment 1 inch  1 inch Topical Q6H Rise Patience, MD   1 inch at 09/06/18 0525  . ondansetron (ZOFRAN) tablet 4 mg  4 mg Oral Q6H PRN Rise Patience, MD       Or  . ondansetron [facility]) injection 4 mg  4 mg Intravenous Q6H PRN Rise Patience, MD      . pravastatin (PRAVACHOL) tablet 20 mg  20 mg Oral QPM Rise Patience, MD   20 mg at 09/06/18 1856  . traZODone (DESYREL) tablet 50 mg  50 mg Oral QHS PRN Rise Patience, MD        Musculoskeletal: Strength & Muscle Tone: within normal limits Gait & Station: UTA since patient is sitting in bed. Patient leans: N/A  Psychiatric Specialty Exam: Physical Exam  Nursing note and vitals reviewed. Constitutional: He is oriented to person, place, and time. He appears well-developed and well-nourished.  HENT:  Head: Normocephalic and atraumatic.  Neck: Normal range of motion.  Respiratory: Effort normal.  Musculoskeletal: Normal range of motion.  Neurological: He is alert and oriented to person, place, and time.  Psychiatric: His speech is normal and behavior is normal. Judgment normal. Cognition and memory are normal. He exhibits a depressed mood. He expresses suicidal ideation. He expresses suicidal plans.    Review of Systems  Cardiovascular: Negative for chest pain.  Gastrointestinal: Negative for  abdominal pain, nausea and vomiting.  Psychiatric/Behavioral: Positive for depression and suicidal ideas. Negative for hallucinations and substance abuse. The patient has insomnia.   All other systems reviewed and are negative.   Blood pressure (!) 170/97, pulse 64, temperature  97.7 F (36.5 C), temperature source Oral, resp. rate 16, height 5\' 9"  (1.753 m), weight 92.5 kg, SpO2 95 %.Body mass index is 30.11 kg/m.  General Appearance: Fairly Groomed, middle aged, [place] American male, wearing casual clothes who is sitting in bed. NAD.   Eye Contact:  Fair  Speech:  Clear and Coherent and Normal Rate  Volume:  Normal  Mood:  Depressed  Affect:  Congruent and Tearful  Thought Process:  Goal Directed, Linear and Descriptions of Associations: Intact  Orientation:  Full (Time, Place, and Person)  Thought Content:  Logical  Suicidal Thoughts:  Yes.  with intent/plan  Homicidal Thoughts:  No  Memory:  Immediate;   Good Recent;   Good Remote;   Good  Judgement:  Fair  Insight:  Fair  Psychomotor Activity:  Normal  Concentration:  Concentration: Good and Attention Span: Good  Recall:  Good  Fund of Knowledge:  Good  Language:  Good  Akathisia:  No  Handed:  Right  AIMS (if indicated):   N/A  Assets:  Communication Skills Desire for Improvement Resilience  ADL's:  Intact  Cognition:  WNL  Sleep:   Poor   Assessment:  Rien Marland is a 48 y.o. male who was admitted with acute respiratory failure with hypoxia likely from fluid overload secondary to missing dialysis. He endorses depressive symptoms in the setting of multiple psychosocial stressors including homelessness and lack of family support. He wrapped a blanket around his neck in a suicide attempt prior to discharge. He is unable to safety plan. He warrants inpatient psychiatric hospitalization for stabilization and treatment.   Treatment Plan Summary: -Continue Lexapro 10 mg daily for depression.  -Continue Trazodone 50 mg qhs  PRN for insomnia.  -EKG reviewed and QTc 512 on 3/8. Please closely monitor when starting or increasing QTc prolonging agents.  -Psychiatry will sign off on patient at this time. Please consult psychiatry again as needed.    Disposition: Recommend psychiatric Inpatient admission when medically cleared.  Faythe Dingwall, DO 09/07/2018 11:24 AM

## 2018-09-07 NOTE — Progress Notes (Signed)
New Admission Note:  Arrival Method: By RN and NT via bed from [facility].  Mental Orientation: Alert & Oriented x4 Telemetry: pt refused  Assessment: Completed Skin: Refer to flowsheet IV: Right FA Pain: 0/10 Safety Measures: Safety Fall Prevention Plan and Suicide Precautions discussed with patient. Admission: Completed 5 [place] Orientation: Patient has been orientated to the room, unit and the staff.  Orders have been reviewed and are being implemented. Will continue to monitor the patient. Call light has been placed within reach.   Vassie Moselle, RN  Phone Number: [phone]

## 2018-09-07 NOTE — Progress Notes (Signed)
Called and received report.

## 2018-09-07 NOTE — Progress Notes (Signed)
Pt refused cardiac monitoring and also refused to have v/s taken. On call provider made aware. Will continue to monitor.

## 2018-09-07 NOTE — Progress Notes (Signed)
[facility] ROUNDING NOTE   Subjective:   Brief summary 48 year old end-stage renal disease Monday Wednesday Friday dialysis missing dialysis treatments due to homelessness.  Received urgent dialysis 09/06/2018  Blood pressure 170/97 pulse 64 temperature 97.7 O2 sats 97% room air  Sodium 138 potassium 4.4 chloride 103 CO2 26 BUN 20 creatinine 7.61 glucose 77 WBCs 4.9 hemoglobin 9.8 platelets 102  Objective:  Vital signs in last 24 hours:  Temp:  [97.5 F (36.4 C)-99.2 F (37.3 C)] 97.7 F (36.5 C) (03/10 0648) Pulse Rate:  [62-77] 64 (03/10 0648) Resp:  [16-21] 16 (03/10 0648) BP: (122-170)/(64-100) 170/97 (03/10 0648) SpO2:  [90 %-96 %] 95 % (03/10 0648) Weight:  [92.5 kg-95.1 kg] 92.5 kg (03/09 1715)  Weight change: 0.2 kg Filed Weights   09/05/18 2351 09/06/18 1230 09/06/18 1715  Weight: 94.9 kg 95.1 kg 92.5 kg    Intake/Output: I/O last 3 completed shifts: In: 360 [P.O.:360] Out: 2500 [Other:2500]   Intake/Output this shift:  No intake/output data recorded.  CVS- RRR RS- CTA ABD- BS present soft non-distended EXT- no edema   Basic Metabolic Panel: Recent Labs  Lab 09/06/18 0011 09/07/18 0546  NA 143 138  K 4.1 4.4  CL 105 103  CO2 24 26  GLUCOSE 111* 77  BUN 50* 20  CREATININE 13.17* 7.61*  CALCIUM 8.5* 7.8*    Liver Function Tests: No results for input(s): AST, ALT, ALKPHOS, BILITOT, PROT, ALBUMIN in the last 168 hours. No results for input(s): LIPASE, AMYLASE in the last 168 hours. No results for input(s): AMMONIA in the last 168 hours.  CBC: Recent Labs  Lab 09/06/18 0011 09/07/18 0546  WBC 6.1 4.9  HGB 11.9* 9.8*  HCT 35.8* 28.6*  MCV 96.5 95.0  PLT 128* 102*    Cardiac Enzymes: No results for input(s): CKTOTAL, CKMB, CKMBINDEX, TROPONINI in the last 168 hours.  BNP: Invalid input(s): POCBNP  CBG: Recent Labs  Lab 09/06/18 0532 09/06/18 1109 09/06/18 1110 09/06/18 2131 09/07/18 0650  GLUCAP 70 83 86 94 75     Microbiology: Results for orders placed or performed during the hospital encounter of 08/10/18  MRSA PCR Screening     Status: None   Collection Time: 08/11/18  1:42 AM  Result Value Ref Range Status   MRSA by PCR NEGATIVE NEGATIVE Final    Comment:        The GeneXpert MRSA Assay (FDA approved for NASAL specimens only), is one component of a comprehensive MRSA colonization surveillance program. It is not intended to diagnose MRSA infection nor to guide or monitor treatment for MRSA infections. Performed at [facility], [place] [address] [place],  [address]     Coagulation Studies: No results for input(s): LABPROT, INR in the last 72 hours.  Urinalysis: No results for input(s): COLORURINE, LABSPEC, PHURINE, GLUCOSEU, HGBUR, BILIRUBINUR, KETONESUR, PROTEINUR, UROBILINOGEN, NITRITE, LEUKOCYTESUR in the last 72 hours.  Invalid input(s): APPERANCEUR    Imaging: Dg Chest 2 View  Result Date: 09/06/2018 CLINICAL DATA:  Dyspnea x1 hour EXAM: CHEST - 2 VIEW COMPARISON:  08/16/2018 FINDINGS: Cardiomegaly with aortic atherosclerosis. Diffuse interstitial prominence consistent with interstitial edema with stable small right pleural effusion is again noted, the degree of interstitial edema has decreased since prior. No new pulmonary consolidation. No pneumothorax. No acute osseous abnormality. IMPRESSION: Cardiomegaly with mild interstitial edema and stable small right pleural effusion. Electronically Signed   By: Ashley Royalty M.D.   On: 09/06/2018 00:38   Ct Head Wo  Contrast  Result Date: 09/06/2018 CLINICAL DATA:  Dyspnea for 1 hour.  Headache. EXAM: CT HEAD WITHOUT CONTRAST TECHNIQUE: Contiguous axial images were obtained from the base of the skull through the vertex without intravenous contrast. COMPARISON:  06/20/2015 head CT and MRI FINDINGS: Brain: Encephalomalacia involving the right frontoparietal lobe from remote infarct as well as remote circumscribed cystic focus  possibly representing cystic encephalomalacia in the left occipital lobe are noted. No new focus of hemorrhage, infarction, midline shift or edema. Midline fourth ventricle basal cisterns without effacement. No hydrocephalus. Vascular: No hyperdense vessel sign. Skull: Intact Sinuses/Orbits: Nonacute Other: None IMPRESSION: 1. Chronic microvascular ischemia and stigmata of remote right frontoparietal lobe infarct with cystic encephalomalacia also noted in the left occipital lobe. 2. No acute intracranial abnormality. Electronically Signed   By: Ashley Royalty M.D.   On: 09/06/2018 00:44     Medications:    . amLODipine  10 mg Oral Daily  . calcitRIOL  0.25 mcg Oral Q M,W,F  . calcium acetate  1,334 mg Oral TID WC  . carvedilol  25 mg Oral BID WC  . Chlorhexidine Gluconate Cloth  6 each Topical Q0600  . escitalopram  10 mg Oral Daily  . heparin  5,000 Units Subcutaneous Q8H  . isosorbide-hydrALAZINE  1 tablet Oral TID  . lanthanum  1,000 mg Oral TID WC  . levETIRAcetam  500 mg Oral BID  . multivitamin  1 tablet Oral QHS  . nitroGLYCERIN  1 inch Topical Q6H  . pravastatin  20 mg Oral QPM   acetaminophen **OR** acetaminophen, hydrALAZINE, ondansetron **OR** ondansetron (ZOFRAN) IV, traZODone  Assessment/ Plan:   ESRD-under Wednesday Friday dialysis [facility] frequent admissions to emergency room for volume overload  ANEMIA-hemoglobin less than 10 we will check iron stores  MBD-stable on calcitriol and binders we will continue to follow  HTN/VOL-some mild volume overload on receive dialysis 09/06/2018 will plan dialysis 09/08/2018  ACCESS-IJ catheter  LOS: 0 Sherril Croon @TODAY @10 :07 AM

## 2018-09-07 NOTE — Discharge Summary (Signed)
Physician Discharge Summary  George [name] ERX:[id number] DOB: 1970/09/18 DOA: 09/05/2018  PCP: Shelby Mattocks, PA-C  Admit date: 09/05/2018 Discharge date: 09/07/2018  Time spent: 45 minutes  Recommendations for Outpatient Follow-up:  1. Follow up with PCP 1-2 weeks 2. Attend dialysis MWF as scheduled  Discharge Diagnoses:  Principal Problem:   Acute respiratory failure with hypoxia ([place]) Active Problems:   ESRD (end stage renal disease) on dialysis ([place])   Hemiparesis affecting left side as late effect of stroke ([place])   Hypertensive urgency   Discharge Condition: stable  Diet recommendation: heart healthy renal   Filed Weights   09/05/18 2351 09/06/18 1230 09/06/18 1715  Weight: 94.9 kg 95.1 kg 92.5 kg    History of present illness:  George [name] a 48 year old with past medical history relevant for ESRD on dialysis on M/W/F, noncompliant, hypertension, seizure disorder, hyperlipidemia who presented with acute hypoxic respiratory failure and hyperkalemia due to missing dialysis due to transportation issues. He refused meds and labs on day of discharge. He was evaluated by psychiatry 08/24/18 who recommended lexapro and trazadone which he does not take.   Hospital Course:  1. Acute respiratory failure with hypoxia likely from fluid overload secondary to missing dialysis. Chest xray as noted above. Oxygen level greater than 90 room air at time of discharge. Dialysis 09/06/18.  2. Hypertension. Poor control mostly related to non-compliance. Discharge wiht amlodipine, bidil, coreg.   3. Anemia. Related to ESRD. Stable  4. Hx stroke. -continue statin  5. ESRD. mwf dialysis patient since 2013. Chart indicated hx of non-compliance. Creatine greater than 13 on admission and 7 at discharge. Potassium level 4.4 at discharge.  Appreciated nephrology assisstance  Procedures:  Dialysis 09/06/18  Consultations:  [facility] nephrology  Discharge Exam: Vitals:   09/07/18  0648 09/07/18 1258  BP: (!) 170/97 (!) 142/83  Pulse: 64 69  Resp: 16 18  Temp: 97.7 F (36.5 C) 98.6 F (37 C)  SpO2: 95% 95%    General: lying in bed will arouse to verbal stimulie Cardiovascular: rrr no mgr no LE edema Respiratory: normal effort BS clear bilaterally no wheeze  Discharge Instructions   Discharge Instructions    Diet - low sodium heart healthy   Complete by:  As directed    Discharge instructions   Complete by:  As directed    Take medications as prescribed Attend dialysis as scheduled Use resources available for transportation   Increase activity slowly   Complete by:  As directed      Allergies as of 09/07/2018      Reactions   Lisinopril Shortness Of Breath   Pt's family said he had SOB, Chest pressure , and coughing       Medication List    STOP taking these medications   benzocaine 10 % mucosal gel Commonly known as:  ORAJEL   clindamycin 300 MG capsule Commonly known as:  CLEOCIN   lidocaine-prilocaine cream Commonly known as:  EMLA     TAKE these medications   amLODipine 10 MG tablet Commonly known as:  NORVASC Take 1 tablet (10 mg total) by mouth daily.   calcitRIOL 0.25 MCG capsule Commonly known as:  ROCALTROL Take 1 capsule (0.25 mcg total) by mouth Every Tuesday,Thursday,and Saturday with dialysis. What changed:  when to take this   calcium acetate 667 MG capsule Commonly known as:  PHOSLO Take 1,334 mg by mouth 3 (three) times daily with meals.   carvedilol 25 MG tablet Commonly known as:  COREG Take  25 mg by mouth 2 (two) times daily with a meal.   escitalopram 10 MG tablet Commonly known as:  LEXAPRO Take 1 tablet (10 mg total) by mouth daily.   isosorbide-hydrALAZINE 20-37.5 MG tablet Commonly known as:  BIDIL Take 1 tablet by mouth 3 (three) times daily.   lanthanum 1000 MG chewable tablet Commonly known as:  FOSRENOL Chew 1 tablet (1,000 mg total) by mouth 3 (three) times daily with meals.   levETIRAcetam  500 MG tablet Commonly known as:  KEPPRA Take 500 mg by mouth 2 (two) times daily.   multivitamin Tabs tablet Take 1 tablet by mouth daily.   pravastatin 20 MG tablet Commonly known as:  PRAVACHOL Take 20 mg by mouth every evening.   traZODone 50 MG tablet Commonly known as:  DESYREL Take 1 tablet (50 mg total) by mouth at bedtime as needed for sleep.      Allergies  Allergen Reactions  . Lisinopril Shortness Of Breath    Pt's family said he had SOB, Chest pressure , and coughing       The results of significant diagnostics from this hospitalization (including imaging, microbiology, ancillary and laboratory) are listed below for reference.    Significant Diagnostic Studies: Dg Orthopantogram  Result Date: 08/27/2018 CLINICAL DATA:  Left-sided jaw pain EXAM: ORTHOPANTOGRAM/PANORAMIC COMPARISON:  None. FINDINGS: Panoramic view of the mandible demonstrates no acute fracture. Dental caries are noted involving the left second maxillary molar as well as the right third maxillary molar. No periapical abscess is noted. No other focal abnormality is seen. IMPRESSION: Dental caries as described.  No acute abnormality is noted. Electronically Signed   By: Inez Catalina M.D.   On: 08/27/2018 16:56   Dg Chest 2 View  Result Date: 09/06/2018 CLINICAL DATA:  Dyspnea x1 hour EXAM: CHEST - 2 VIEW COMPARISON:  08/16/2018 FINDINGS: Cardiomegaly with aortic atherosclerosis. Diffuse interstitial prominence consistent with interstitial edema with stable small right pleural effusion is again noted, the degree of interstitial edema has decreased since prior. No new pulmonary consolidation. No pneumothorax. No acute osseous abnormality. IMPRESSION: Cardiomegaly with mild interstitial edema and stable small right pleural effusion. Electronically Signed   By: Ashley Royalty M.D.   On: 09/06/2018 00:38   Dg Chest 2 View  Result Date: 08/16/2018 CLINICAL DATA:  Shortness of breath. EXAM: CHEST - 2 VIEW  COMPARISON:  Radiographs of August 10, 2018. FINDINGS: Stable cardiomegaly. Mild central pulmonary vascular congestion is noted. Atherosclerosis of thoracic aorta is noted. No pneumothorax is noted. Probable mild bibasilar pulmonary edema is noted. Mild right pleural effusion is noted. Bony thorax is unremarkable. IMPRESSION: Stable cardiomegaly with central pulmonary vascular congestion and probable mild bibasilar pulmonary edema. Mild right pleural effusion. Aortic Atherosclerosis (ICD10-I70.0). Electronically Signed   By: Marijo Conception, M.D.   On: 08/16/2018 13:18   Dg Chest 2 View  Result Date: 08/10/2018 CLINICAL DATA:  Acute onset of chest pain and shortness of breath that began last night. End stage renal disease on hemodialysis. EXAM: CHEST - 2 VIEW COMPARISON:  08/10/2017 and earlier. FINDINGS: Cardiac silhouette markedly enlarged. Mild diffuse interstitial and likely airspace pulmonary edema. Hyperinflated RIGHT lung as noted previously. Chronic RIGHT pleural effusion and/or pleuroparenchymal scarring at the RIGHT base, unchanged. No visible LEFT pleural effusion. Visualized bony thorax intact. IMPRESSION: Mild CHF, with stable marked cardiomegaly and mild diffuse interstitial and likely airspace pulmonary edema. Stable chronic RIGHT pleural effusion and/or pleuroparenchymal scarring at the RIGHT base. Electronically Signed   By:  Evangeline Dakin M.D.   On: 08/10/2018 16:42   Ct Head Wo Contrast  Result Date: 09/06/2018 CLINICAL DATA:  Dyspnea for 1 hour.  Headache. EXAM: CT HEAD WITHOUT CONTRAST TECHNIQUE: Contiguous axial images were obtained from the base of the skull through the vertex without intravenous contrast. COMPARISON:  06/20/2015 head CT and MRI FINDINGS: Brain: Encephalomalacia involving the right frontoparietal lobe from remote infarct as well as remote circumscribed cystic focus possibly representing cystic encephalomalacia in the left occipital lobe are noted. No new focus of  hemorrhage, infarction, midline shift or edema. Midline fourth ventricle basal cisterns without effacement. No hydrocephalus. Vascular: No hyperdense vessel sign. Skull: Intact Sinuses/Orbits: Nonacute Other: None IMPRESSION: 1. Chronic microvascular ischemia and stigmata of remote right frontoparietal lobe infarct with cystic encephalomalacia also noted in the left occipital lobe. 2. No acute intracranial abnormality. Electronically Signed   By: Ashley Royalty M.D.   On: 09/06/2018 00:44    Microbiology: No results found for this or any previous visit (from the past 240 hour(s)).   Labs: Basic Metabolic Panel: Recent Labs  Lab 09/06/18 0011 09/07/18 0546  NA 143 138  K 4.1 4.4  CL 105 103  CO2 24 26  GLUCOSE 111* 77  BUN 50* 20  CREATININE 13.17* 7.61*  CALCIUM 8.5* 7.8*   Liver Function Tests: No results for input(s): AST, ALT, ALKPHOS, BILITOT, PROT, ALBUMIN in the last 168 hours. No results for input(s): LIPASE, AMYLASE in the last 168 hours. No results for input(s): AMMONIA in the last 168 hours. CBC: Recent Labs  Lab 09/06/18 0011 09/07/18 0546  WBC 6.1 4.9  HGB 11.9* 9.8*  HCT 35.8* 28.6*  MCV 96.5 95.0  PLT 128* 102*   Cardiac Enzymes: No results for input(s): CKTOTAL, CKMB, CKMBINDEX, TROPONINI in the last 168 hours. BNP: BNP (last 3 results) Recent Labs    08/10/18 1720  BNP 2,217.6*    ProBNP (last 3 results) No results for input(s): PROBNP in the last 8760 hours.  CBG: Recent Labs  Lab 09/06/18 1109 09/06/18 1110 09/06/18 2131 09/07/18 0650 09/07/18 1128  GLUCAP 83 86 94 75 97       Signed:  Aldahir Litaker M NP [facility] 09/07/2018, 2:17 PM

## 2018-09-08 DIAGNOSIS — F322 Major depressive disorder, single episode, severe without psychotic features: Secondary | ICD-10-CM

## 2018-09-08 DIAGNOSIS — I120 Hypertensive chronic kidney disease with stage 5 chronic kidney disease or end stage renal disease: Secondary | ICD-10-CM | POA: Diagnosis not present

## 2018-09-08 DIAGNOSIS — Z992 Dependence on renal dialysis: Secondary | ICD-10-CM | POA: Diagnosis not present

## 2018-09-08 DIAGNOSIS — N186 End stage renal disease: Secondary | ICD-10-CM | POA: Diagnosis not present

## 2018-09-08 DIAGNOSIS — D631 Anemia in chronic kidney disease: Secondary | ICD-10-CM | POA: Diagnosis not present

## 2018-09-08 DIAGNOSIS — N2581 Secondary hyperparathyroidism of renal origin: Secondary | ICD-10-CM | POA: Diagnosis not present

## 2018-09-08 MED ORDER — ALBUTEROL SULFATE (2.5 MG/3ML) 0.083% IN NEBU: 3.0000 mL | INHALATION_SOLUTION | Freq: Four times a day (QID) | RESPIRATORY_TRACT | Status: DC | PRN

## 2018-09-08 NOTE — Progress Notes (Signed)
[facility] ROUNDING NOTE   Subjective:   Brief summary 48 year old end-stage renal disease Monday Wednesday Friday dialysis missing dialysis treatments due to homelessness.  Received urgent dialysis 09/06/2018.  Is scheduled for dialysis 09/08/2018.  Evaluated by psychiatry and plan for movement to inpatient psychiatric unit [facility] at [facility]  Blood pressure 170/97 pulse 64 temperature 97.7 O2 sats 95% room air  Sodium 138 potassium 4.4 chloride 103 CO2 26 BUN 20 creatinine 7.61 glucose 77 calcium 7.8 WBC 4.9 hemoglobin 9.8 platelets 102  Medications amlodipine 10 mg daily Coreg 25 mg twice daily, hydralazine/isosorbide 1 tablet 3 times daily, pravastatin 20 mg daily Lexapro 10 mg daily trazodone 50 mg nightly Calcitrol 0.25 mcg Monday Wednesday Friday PhosLo 1334 mg 3 times daily, Fosrenol 1 g 3 times daily, Keppra 500 mg twice daily, multivitamins 1 daily  Objective:  Vital signs in last 24 hours:  Temp:  [98.6 F (37 C)-98.8 F (37.1 C)] 98.8 F (37.1 C) (03/10 1806) Pulse Rate:  [64-69] 64 (03/10 1806) Resp:  [18] 18 (03/10 1258) BP: (134-142)/(81-83) 134/81 (03/10 1806) SpO2:  [95 %-97 %] 97 % (03/10 1806)  Weight change:  Filed Weights   09/05/18 2351 09/06/18 1230 09/06/18 1715  Weight: 94.9 kg 95.1 kg 92.5 kg    Intake/Output: I/O last 3 completed shifts: In: 240 [P.O.:240] Out: -    Intake/Output this shift:  Total I/O In: 240 [P.O.:240] Out: -   CVS-regular rate and rhythm faint systolic murmur left sternal edge RS-clear to auscultation no wheezes or rales ABD- BS present soft non-distended EXT- no edema   Basic Metabolic Panel: Recent Labs  Lab 09/06/18 0011 09/07/18 0546  NA 143 138  K 4.1 4.4  CL 105 103  CO2 24 26  GLUCOSE 111* 77  BUN 50* 20  CREATININE 13.17* 7.61*  CALCIUM 8.5* 7.8*    Liver Function Tests: No results for input(s): AST, ALT, ALKPHOS, BILITOT, PROT, ALBUMIN in the last 168 hours. No  results for input(s): LIPASE, AMYLASE in the last 168 hours. No results for input(s): AMMONIA in the last 168 hours.  CBC: Recent Labs  Lab 09/06/18 0011 09/07/18 0546  WBC 6.1 4.9  HGB 11.9* 9.8*  HCT 35.8* 28.6*  MCV 96.5 95.0  PLT 128* 102*    Cardiac Enzymes: No results for input(s): CKTOTAL, CKMB, CKMBINDEX, TROPONINI in the last 168 hours.  BNP: Invalid input(s): POCBNP  CBG: Recent Labs  Lab 09/06/18 1109 09/06/18 1110 09/06/18 2131 09/07/18 0650 09/07/18 1128  GLUCAP 83 86 94 75 97    Microbiology: Results for orders placed or performed during the hospital encounter of 09/05/18  Gastrointestinal Panel by PCR , Stool     Status: None   Collection Time: 09/06/18  6:47 PM  Result Value Ref Range Status   Campylobacter species NOT DETECTED NOT DETECTED Final   Plesimonas shigelloides NOT DETECTED NOT DETECTED Final   Salmonella species NOT DETECTED NOT DETECTED Final   Yersinia enterocolitica NOT DETECTED NOT DETECTED Final   Vibrio species NOT DETECTED NOT DETECTED Final   Vibrio cholerae NOT DETECTED NOT DETECTED Final   Enteroaggregative E coli (EAEC) NOT DETECTED NOT DETECTED Final   Enteropathogenic E coli (EPEC) NOT DETECTED NOT DETECTED Final   Enterotoxigenic E coli (ETEC) NOT DETECTED NOT DETECTED Final   Shiga like toxin producing E coli (STEC) NOT DETECTED NOT DETECTED Final   Shigella/Enteroinvasive E coli (EIEC) NOT DETECTED NOT DETECTED Final   Cryptosporidium NOT DETECTED NOT DETECTED  Final   Cyclospora cayetanensis NOT DETECTED NOT DETECTED Final   Entamoeba histolytica NOT DETECTED NOT DETECTED Final   Giardia lamblia NOT DETECTED NOT DETECTED Final   Adenovirus F40/41 NOT DETECTED NOT DETECTED Final   Astrovirus NOT DETECTED NOT DETECTED Final   Norovirus GI/GII NOT DETECTED NOT DETECTED Final   Rotavirus A NOT DETECTED NOT DETECTED Final   Sapovirus (I, II, IV, and V) NOT DETECTED NOT DETECTED Final    Comment: Performed at [facility], [address] [place], [place] [address]    Coagulation Studies: No results for input(s): LABPROT, INR in the last 72 hours.  Urinalysis: No results for input(s): COLORURINE, LABSPEC, PHURINE, GLUCOSEU, HGBUR, BILIRUBINUR, KETONESUR, PROTEINUR, UROBILINOGEN, NITRITE, LEUKOCYTESUR in the last 72 hours.  Invalid input(s): APPERANCEUR    Imaging: No results found.   Medications:    . amLODipine  10 mg Oral Daily  . calcitRIOL  0.25 mcg Oral Q M,W,F  . calcium acetate  1,334 mg Oral TID WC  . carvedilol  25 mg Oral BID WC  . Chlorhexidine Gluconate Cloth  6 each Topical Q0600  . Chlorhexidine Gluconate Cloth  6 each Topical Q0600  . escitalopram  10 mg Oral Daily  . heparin  5,000 Units Subcutaneous Q8H  . isosorbide-hydrALAZINE  1 tablet Oral TID  . lanthanum  1,000 mg Oral TID WC  . levETIRAcetam  500 mg Oral BID  . multivitamin  1 tablet Oral QHS  . nitroGLYCERIN  1 inch Topical Q6H  . pravastatin  20 mg Oral QPM   acetaminophen **OR** acetaminophen, hydrALAZINE, ondansetron **OR** ondansetron (ZOFRAN) IV, traZODone  Assessment/ Plan:   ESRD-under Wednesday Friday dialysis [facility] frequent admissions to emergency room for volume overload.  Will receive dialysis 09/08/2018.  Appears to be in need for inpatient psychiatric evaluation  ANEMIA-hemoglobin less than 10 we will check iron stores  MBD-stable on calcitriol and binders we will continue to follow  HTN/VOL-some mild volume overload on receive dialysis 09/06/2018, dialysis scheduled.  Isosorbide/hydralazine 1 tablet 3 times daily.  Amlodipine 10 mg daily and Coreg 25 mg twice daily  ACCESS-IJ catheter  Psychiatry patient suicidal needing inpatient psychiatric admission.  Lexapro 10 mg daily and trazodone 50 mg nightly.  Hyperlipidemia continues on pravastatin 20 mg daily  Seizure disorder  continue 500 mg Keppra daily  LOS: 1 Sherril Croon @TODAY @9 :48 AM

## 2018-09-08 NOTE — Progress Notes (Signed)
Patient was informed of his scheduled  hemodialysis and refused it. Explained the consequences of not having his treatment.  Patient did not have eye contact nor verbal response.  Also has been  refusing some of his meds. Now, he wants his saline lock be taken out.

## 2018-09-08 NOTE — Progress Notes (Signed)
[facility] Progress Note  Patient: George [name] ZDG:[id number]   PCP: Shelby Mattocks, PA-C DOB: July 13, 1970   DOA: 09/05/2018   DOS: 09/08/2018   Date of Service: the patient was seen and examined on 09/08/2018  Brief hospital course: Pt. with PMH of ESRD on hemodialysis missed his dialysis last 2 sessions on Wednesday and Friday was discharged home on August 30, 2018 have to be admitted for respiratory failure secondary to fluid overload presents to the ER with complaints of shortness of breath and headache.  Patient states over the last 2 days patient has been having shortness of breath increased on exertion Currently further plan is identify safe discharge location and inpatient psychiatric facility.  Patient is medically cleared to be discharged from the hospital.  Subjective: Denies any acute complaint no nausea no vomiting no fever no chills.  Assessment and Plan: 1. Acute respiratory failure with hypoxia likely from fluid overload secondary to missing dialysis -nephrology has been consulted for dialysis.  Closely follow respiratory status. 2. Hypertension uncontrolled improve with dialysis.  Continue home medications including amlodipine BiDil and Coreg.  PRN IV hydralazine for now. 3. Headache resolved.  Likely from uncontrolled hypertension.  CT head unremarkable. 4. History of stroke on statins. 5. History of seizure on Keppra. 6. Anemia likely from ESRD.  Follow CBC. 7. History of depression on Lexapro.  Suicidal ideation.   Patient did exhibit suicidal ideation.  Psychiatry was consulted.  Recommended inpatient psychiatric admission. 8. Mild thrombocytopenia follow CBC. 9. Bilateral expiratory wheezing. Likely secondary to volume overload. No evidence of active infection for now. Continue albuterol inhaler.  Diet: RENALD IET DVT Prophylaxis: subcutaneous Heparin  Advance goals of care discussion: full code  Family Communication: no family was present at bedside,  at the time of interview.  Disposition:  Discharge to [facility].  Consultants: Nephrology, psychiatry Procedures: HD  Scheduled Meds: . amLODipine  10 mg Oral Daily  . calcitRIOL  0.25 mcg Oral Q M,W,F  . calcium acetate  1,334 mg Oral TID WC  . carvedilol  25 mg Oral BID WC  . Chlorhexidine Gluconate Cloth  6 each Topical Q0600  . Chlorhexidine Gluconate Cloth  6 each Topical Q0600  . escitalopram  10 mg Oral Daily  . heparin  5,000 Units Subcutaneous Q8H  . isosorbide-hydrALAZINE  1 tablet Oral TID  . lanthanum  1,000 mg Oral TID WC  . levETIRAcetam  500 mg Oral BID  . multivitamin  1 tablet Oral QHS  . nitroGLYCERIN  1 inch Topical Q6H  . pravastatin  20 mg Oral QPM   Continuous Infusions: PRN Meds: acetaminophen **OR** acetaminophen, hydrALAZINE, ondansetron **OR** ondansetron (ZOFRAN) IV, traZODone Antibiotics: Anti-infectives (From admission, onward)   Start     Dose/Rate Route Frequency Ordered Stop   09/06/18 1000  clindamycin (CLEOCIN) capsule 300 mg     300 mg Oral 3 times daily 09/06/18 0509 09/06/18 2120       Objective: Physical Exam: Vitals:   09/07/18 0053 09/07/18 0648 09/07/18 1258 09/07/18 1806  BP: (!) 142/89 (!) 170/97 (!) 142/83 134/81  Pulse: 65 64 69 64  Resp: 18 16 18    Temp: 98.1 F (36.7 C) 97.7 F (36.5 C) 98.6 F (37 C) 98.8 F (37.1 C)  TempSrc: Oral Oral Oral Oral  SpO2: 96% 95% 95% 97%  Weight:      Height:        Intake/Output Summary (Last 24 hours) at 09/08/2018 1642 Last data filed at 09/08/2018 1236 Gross per  24 hour  Intake 800 ml  Output -  Net 800 ml   Filed Weights   09/05/18 2351 09/06/18 1230 09/06/18 1715  Weight: 94.9 kg 95.1 kg 92.5 kg   General: Alert, Awake and Oriented to Time, Place and Person. Appear in mild distress, affect appropriate Eyes: PERRL, Conjunctiva normal ENT: Oral Mucosa clear moist. Neck: no JVD, no Abnormal Mass Or lumps Cardiovascular: S1 and S2 Present, no Murmur, Peripheral Pulses Present  Respiratory: normal respiratory effort, Bilateral Air entry equal and Decreased, no use of accessory muscle, no Crackles, bilateral  wheezes Abdomen: Bowel Sound present, Soft and n tenderness, ono hernia Skin: no redness, n Rash, ono induration Extremities: no Pedal edema, no calf tenderness Neurologic: Grossly no focal neuro deficit. Bilaterally Equal motor strength  Data Reviewed: CBC: Recent Labs  Lab 09/06/18 0011 09/07/18 0546  WBC 6.1 4.9  HGB 11.9* 9.8*  HCT 35.8* 28.6*  MCV 96.5 95.0  PLT 128* 158*   Basic Metabolic Panel: Recent Labs  Lab 09/06/18 0011 09/07/18 0546  NA 143 138  K 4.1 4.4  CL 105 103  CO2 24 26  GLUCOSE 111* 77  BUN 50* 20  CREATININE 13.17* 7.61*  CALCIUM 8.5* 7.8*    Liver Function Tests: No results for input(s): AST, ALT, ALKPHOS, BILITOT, PROT, ALBUMIN in the last 168 hours. No results for input(s): LIPASE, AMYLASE in the last 168 hours. No results for input(s): AMMONIA in the last 168 hours. Coagulation Profile: No results for input(s): INR, PROTIME in the last 168 hours. Cardiac Enzymes: No results for input(s): CKTOTAL, CKMB, CKMBINDEX, TROPONINI in the last 168 hours. BNP (last 3 results) No results for input(s): PROBNP in the last 8760 hours. CBG: Recent Labs  Lab 09/06/18 1109 09/06/18 1110 09/06/18 2131 09/07/18 0650 09/07/18 1128  GLUCAP 83 86 94 75 97   Studies: No results found.   Time spent: 35 minutes  Author: Berle Mull, MD [facility] 09/08/2018 4:42 PM  To reach On-call, see care teams to locate the attending and reach out to them via [email or web address]. If 7PM-7AM, please contact night-coverage If you still have difficulty reaching the attending provider, please page the [facility] (Director on Call) for [facility] on amion for assistance.

## 2018-09-08 NOTE — Clinical Social Work Note (Signed)
Call made to [facility] and referral made. They will review his information and follow-up with CSW Thursday. Other facilities contacted: 1. [facility] - at capacity 2. [facility] - at capacity 3. [facility] - no beds CSW will wait to hear from [facility] tomorrow after they have reviewed patient's clinical information.  Novah Goza Givens, MSW, LCSW Licensed Clinical Social Worker [place] [phone]

## 2018-09-09 DIAGNOSIS — F322 Major depressive disorder, single episode, severe without psychotic features: Secondary | ICD-10-CM | POA: Diagnosis not present

## 2018-09-09 DIAGNOSIS — D631 Anemia in chronic kidney disease: Secondary | ICD-10-CM | POA: Diagnosis not present

## 2018-09-09 DIAGNOSIS — Z992 Dependence on renal dialysis: Secondary | ICD-10-CM | POA: Diagnosis not present

## 2018-09-09 DIAGNOSIS — N2581 Secondary hyperparathyroidism of renal origin: Secondary | ICD-10-CM | POA: Diagnosis not present

## 2018-09-09 DIAGNOSIS — N186 End stage renal disease: Secondary | ICD-10-CM | POA: Diagnosis not present

## 2018-09-09 DIAGNOSIS — I120 Hypertensive chronic kidney disease with stage 5 chronic kidney disease or end stage renal disease: Secondary | ICD-10-CM | POA: Diagnosis not present

## 2018-09-09 MED ORDER — MOMETASONE FURO-FORMOTEROL FUM 100-5 MCG/ACT IN AERO
2.0000 | INHALATION_SPRAY | Freq: Two times a day (BID) | RESPIRATORY_TRACT | Status: DC
  Administered 2018-09-09 – 2018-09-12 (×3): 2 via RESPIRATORY_TRACT
  Filled 2018-09-09: qty 8.8

## 2018-09-09 MED ORDER — CHLORHEXIDINE GLUCONATE CLOTH 2 % EX PADS: 6.0000 | MEDICATED_PAD | Freq: Every day | CUTANEOUS | Status: DC

## 2018-09-09 NOTE — Progress Notes (Signed)
Patient wheezing bilaterally, MD ordered breathing treatments, patient refused. Patient is aware albuterol is ordered as needed and he can request a breathing treatment at any time. Will continue to monitor

## 2018-09-09 NOTE — Progress Notes (Signed)
[facility] ROUNDING NOTE   Subjective:   Brief summary 48 year old end-stage renal disease Monday Wednesday Friday dialysis missing dialysis treatments due to homelessness.  Received urgent dialysis 09/06/2018.  Patient refused to attend his dialysis treatment scheduled for 09/08/2018.  This morning he has a sitter in the room.  He is agreeable to treatment 09/09/2018  Evaluated by psychiatry and plan for movement to inpatient psychiatric unit possibly at [facility].  Awaiting bed  Blood pressure 131/80 pulse 61 temperature 97.5 O2 sats 95% room air  Sodium 138 potassium 4.4 chloride 102 CO2 26 glucose 77 BUN 20 creatinine 7.61 calcium 7.8 WBC 4.9 hemoglobin 9.8 platelets 102  Medications amlodipine 10 mg daily Coreg 25 mg twice daily, hydralazine/isosorbide 1 tablet 3 times daily, pravastatin 20 mg daily Lexapro 10 mg daily trazodone 50 mg nightly Calcitrol 0.25 mcg Monday Wednesday Friday PhosLo 1334 mg 3 times daily, Fosrenol 1 g 3 times daily, Keppra 500 mg twice daily, multivitamins 1 daily  Objective:  Vital signs in last 24 hours:  Temp:  [97.5 F (36.4 C)-98.3 F (36.8 C)] 97.5 F (36.4 C) (03/12 0920) Pulse Rate:  [61-62] 61 (03/12 0920) Resp:  [16-18] 18 (03/12 0920) BP: (131-136)/(75-80) 131/80 (03/12 0920) SpO2:  [95 %-100 %] 95 % (03/12 0920)  Weight change:  Filed Weights   09/05/18 2351 09/06/18 1230 09/06/18 1715  Weight: 94.9 kg 95.1 kg 92.5 kg    Intake/Output: I/O last 3 completed shifts: In: 1040 [P.O.:1040] Out: -    Intake/Output this shift:  Total I/O In: 360 [P.O.:360] Out: -   CVS-regular rate and rhythm faint systolic murmur left sternal edge RS-clear to auscultation no wheezes or rales ABD- BS present soft non-distended EXT- no edema   Basic Metabolic Panel: Recent Labs  Lab 09/06/18 0011 09/07/18 0546  NA 143 138  K 4.1 4.4  CL 105 103  CO2 24 26  GLUCOSE 111* 77  BUN 50* 20  CREATININE 13.17* 7.61*   CALCIUM 8.5* 7.8*    Liver Function Tests: No results for input(s): AST, ALT, ALKPHOS, BILITOT, PROT, ALBUMIN in the last 168 hours. No results for input(s): LIPASE, AMYLASE in the last 168 hours. No results for input(s): AMMONIA in the last 168 hours.  CBC: Recent Labs  Lab 09/06/18 0011 09/07/18 0546  WBC 6.1 4.9  HGB 11.9* 9.8*  HCT 35.8* 28.6*  MCV 96.5 95.0  PLT 128* 102*    Cardiac Enzymes: No results for input(s): CKTOTAL, CKMB, CKMBINDEX, TROPONINI in the last 168 hours.  BNP: Invalid input(s): POCBNP  CBG: Recent Labs  Lab 09/06/18 1109 09/06/18 1110 09/06/18 2131 09/07/18 0650 09/07/18 1128  GLUCAP 83 86 94 75 97    Microbiology: Results for orders placed or performed during the hospital encounter of 09/05/18  Gastrointestinal Panel by PCR , Stool     Status: None   Collection Time: 09/06/18  6:47 PM  Result Value Ref Range Status   Campylobacter species NOT DETECTED NOT DETECTED Final   Plesimonas shigelloides NOT DETECTED NOT DETECTED Final   Salmonella species NOT DETECTED NOT DETECTED Final   Yersinia enterocolitica NOT DETECTED NOT DETECTED Final   Vibrio species NOT DETECTED NOT DETECTED Final   Vibrio cholerae NOT DETECTED NOT DETECTED Final   Enteroaggregative E coli (EAEC) NOT DETECTED NOT DETECTED Final   Enteropathogenic E coli (EPEC) NOT DETECTED NOT DETECTED Final   Enterotoxigenic E coli (ETEC) NOT DETECTED NOT DETECTED Final   Shiga like toxin producing E  coli (STEC) NOT DETECTED NOT DETECTED Final   Shigella/Enteroinvasive E coli (EIEC) NOT DETECTED NOT DETECTED Final   Cryptosporidium NOT DETECTED NOT DETECTED Final   Cyclospora cayetanensis NOT DETECTED NOT DETECTED Final   Entamoeba histolytica NOT DETECTED NOT DETECTED Final   Giardia lamblia NOT DETECTED NOT DETECTED Final   Adenovirus F40/41 NOT DETECTED NOT DETECTED Final   Astrovirus NOT DETECTED NOT DETECTED Final   Norovirus GI/GII NOT DETECTED NOT DETECTED Final    Rotavirus A NOT DETECTED NOT DETECTED Final   Sapovirus (I, II, IV, and V) NOT DETECTED NOT DETECTED Final    Comment: Performed at [facility] [address] [place], [place] [address]    Coagulation Studies: No results for input(s): LABPROT, INR in the last 72 hours.  Urinalysis: No results for input(s): COLORURINE, LABSPEC, PHURINE, GLUCOSEU, HGBUR, BILIRUBINUR, KETONESUR, PROTEINUR, UROBILINOGEN, NITRITE, LEUKOCYTESUR in the last 72 hours.  Invalid input(s): APPERANCEUR    Imaging: No results found.   Medications:    . amLODipine  10 mg Oral Daily  . calcitRIOL  0.25 mcg Oral Q M,W,F  . calcium acetate  1,334 mg Oral TID WC  . carvedilol  25 mg Oral BID WC  . Chlorhexidine Gluconate Cloth  6 each Topical Q0600  . Chlorhexidine Gluconate Cloth  6 each Topical Q0600  . Chlorhexidine Gluconate Cloth  6 each Topical Q0600  . escitalopram  10 mg Oral Daily  . heparin  5,000 Units Subcutaneous Q8H  . isosorbide-hydrALAZINE  1 tablet Oral TID  . lanthanum  1,000 mg Oral TID WC  . levETIRAcetam  500 mg Oral BID  . multivitamin  1 tablet Oral QHS  . nitroGLYCERIN  1 inch Topical Q6H  . pravastatin  20 mg Oral QPM   acetaminophen **OR** acetaminophen, albuterol, hydrALAZINE, ondansetron **OR** ondansetron (ZOFRAN) IV, traZODone  Assessment/ Plan:   ESRD-under Wednesday Friday dialysis [facility] frequent admissions to emergency room for volume overload.  Patient will receive dialysis 09/09/2018.  Will dialyze 09/10/2018 to maintain a Monday Wednesday Friday schedule  ANEMIA-hemoglobin less than 10 we will check iron stores  MBD-stable on calcitriol and binders we will continue to follow  HTN/VOL-some mild volume overload on receive dialysis 09/06/2018, dialysis scheduled.  Isosorbide/hydralazine 1 tablet 3 times daily.  Amlodipine 10 mg daily and Coreg 25 mg twice daily  ACCESS-IJ catheter  Psychiatry patient suicidal needing inpatient psychiatric  admission.  Lexapro 10 mg daily and trazodone 50 mg nightly.  Hyperlipidemia continues on pravastatin 20 mg daily  Seizure disorder  continue 500 mg Keppra daily  LOS: 2 Sherril Croon @TODAY @10 :56 AM

## 2018-09-09 NOTE — Clinical Social Work Note (Signed)
CSW received call from [facility] indicating that they have declined patient as he is not appropriate for their facility. Referral Form for Admission to a [facility] completed and faxed to [facility]), along with requested clinicals. CSW will continue to follow and assist with discharge when patient has a bed at [facility].  Kinzleigh Kandler Givens, MSW, LCSW Licensed Clinical Social Worker [place] [phone]

## 2018-09-09 NOTE — Progress Notes (Signed)
[facility] Progress Note  Patient: George [name] NKN:[id number]   PCP: Shelby Mattocks, PA-C DOB: 21-Apr-1971   DOA: 09/05/2018   DOS: 09/09/2018   Date of Service: the patient was seen and examined on 09/09/2018  Brief hospital course: Pt. with PMH of ESRD on hemodialysis missed his dialysis last 2 sessions on Wednesday and Friday was discharged home on August 30, 2018 have to be admitted for respiratory failure secondary to fluid overload presents to the ER with complaints of shortness of breath and headache.  Patient states over the last 2 days patient has been having shortness of breath increased on exertion Currently further plan is identify safe discharge location and inpatient psychiatric facility.  Patient is medically cleared to be discharged from the hospital.  Did refusing his HD though.  Subjective: No acute complaint no nausea no vomiting.  Refusing his HD  Assessment and Plan: 1. Acute respiratory failure with hypoxia likely from fluid overload secondary to missing dialysis -nephrology has been consulted for dialysis.  Closely follow respiratory status. 2. Hypertension uncontrolled improve with dialysis.  Continue home medications including amlodipine BiDil and Coreg.  PRN IV hydralazine for now. 3. Headache resolved.  Likely from uncontrolled hypertension.  CT head unremarkable. 4. History of stroke on statins. 5. History of seizure on Keppra. 6. Anemia likely from ESRD.  Follow CBC. 7. History of depression on Lexapro.  Suicidal ideation.   Patient did exhibit suicidal ideation.  Psychiatry was consulted.  Recommended inpatient psychiatric admission. 8. Mild thrombocytopenia follow CBC. 9. Bilateral expiratory wheezing. Likely secondary to volume overload. No evidence of active infection for now. Continue albuterol inhaler.  Add Dulera  Diet: RENALD IET DVT Prophylaxis: subcutaneous Heparin  Advance goals of care discussion: full code  Family Communication: no  family was present at bedside, at the time of interview.  Disposition:  Discharge to [facility].  Consultants: Nephrology, psychiatry Procedures: HD  Scheduled Meds:  amLODipine  10 mg Oral Daily   calcitRIOL  0.25 mcg Oral Q M,W,F   calcium acetate  1,334 mg Oral TID WC   carvedilol  25 mg Oral BID WC   Chlorhexidine Gluconate Cloth  6 each Topical Q0600   Chlorhexidine Gluconate Cloth  6 each Topical Q0600   Chlorhexidine Gluconate Cloth  6 each Topical Q0600   escitalopram  10 mg Oral Daily   heparin  5,000 Units Subcutaneous Q8H   isosorbide-hydrALAZINE  1 tablet Oral TID   lanthanum  1,000 mg Oral TID WC   levETIRAcetam  500 mg Oral BID   mometasone-formoterol  2 puff Inhalation BID   multivitamin  1 tablet Oral QHS   nitroGLYCERIN  1 inch Topical Q6H   pravastatin  20 mg Oral QPM   Continuous Infusions: PRN Meds: acetaminophen **OR** acetaminophen, albuterol, hydrALAZINE, ondansetron **OR** ondansetron (ZOFRAN) IV, traZODone Antibiotics: Anti-infectives (From admission, onward)   Start     Dose/Rate Route Frequency Ordered Stop   09/06/18 1000  clindamycin (CLEOCIN) capsule 300 mg     300 mg Oral 3 times daily 09/06/18 0509 09/06/18 2120       Objective: Physical Exam: Vitals:   09/07/18 1258 09/07/18 1806 09/08/18 2124 09/09/18 0920  BP:   136/75 131/80  Pulse: 69 64 62 61  Resp: 18  16 18   Temp: 98.6 F (37 C) 98.8 F (37.1 C) 98.3 F (36.8 C) (!) 97.5 F (36.4 C)  TempSrc: Oral Oral Oral Oral  SpO2: 95% 97% 100% 95%  Weight:  Height:        Intake/Output Summary (Last 24 hours) at 09/09/2018 2029 Last data filed at 09/09/2018 1702 Gross per 24 hour  Intake 840 ml  Output --  Net 840 ml   Filed Weights   09/05/18 2351 09/06/18 1230 09/06/18 1715  Weight: 94.9 kg 95.1 kg 92.5 kg   General: Alert, Awake and Oriented to Time, Place and Person. Appear in mild distress, affect appropriate Eyes: PERRL, Conjunctiva normal ENT: Oral Mucosa  clear moist. Neck: no JVD, no Abnormal Mass Or lumps Cardiovascular: S1 and S2 Present, no Murmur, Peripheral Pulses Present Respiratory: normal respiratory effort, Bilateral Air entry equal and Decreased, no use of accessory muscle, no Crackles, bilateral  wheezes Abdomen: Bowel Sound present, Soft and n tenderness, ono hernia Skin: no redness, n Rash, ono induration Extremities: no Pedal edema, no calf tenderness Neurologic: Grossly no focal neuro deficit. Bilaterally Equal motor strength  Data Reviewed: CBC: Recent Labs  Lab 09/06/18 0011 09/07/18 0546  WBC 6.1 4.9  HGB 11.9* 9.8*  HCT 35.8* 28.6*  MCV 96.5 95.0  PLT 128* 701*   Basic Metabolic Panel: Recent Labs  Lab 09/06/18 0011 09/07/18 0546  NA 143 138  K 4.1 4.4  CL 105 103  CO2 24 26  GLUCOSE 111* 77  BUN 50* 20  CREATININE 13.17* 7.61*  CALCIUM 8.5* 7.8*    Liver Function Tests: No results for input(s): AST, ALT, ALKPHOS, BILITOT, PROT, ALBUMIN in the last 168 hours. No results for input(s): LIPASE, AMYLASE in the last 168 hours. No results for input(s): AMMONIA in the last 168 hours. Coagulation Profile: No results for input(s): INR, PROTIME in the last 168 hours. Cardiac Enzymes: No results for input(s): CKTOTAL, CKMB, CKMBINDEX, TROPONINI in the last 168 hours. BNP (last 3 results) No results for input(s): PROBNP in the last 8760 hours. CBG: Recent Labs  Lab 09/06/18 1109 09/06/18 1110 09/06/18 2131 09/07/18 0650 09/07/18 1128  GLUCAP 83 86 94 75 97   Studies: No results found.   Time spent: 35 minutes  Author: Berle Mull, MD [facility] 09/09/2018 8:29 PM  To reach On-call, see care teams to locate the attending and reach out to them via [email or web address]. If 7PM-7AM, please contact night-coverage If you still have difficulty reaching the attending provider, please page the [facility] (Director on Call) for [facility] on amion for assistance.

## 2018-09-09 NOTE — Progress Notes (Signed)
Refusing HD

## 2018-09-10 DIAGNOSIS — I120 Hypertensive chronic kidney disease with stage 5 chronic kidney disease or end stage renal disease: Secondary | ICD-10-CM | POA: Diagnosis not present

## 2018-09-10 DIAGNOSIS — D631 Anemia in chronic kidney disease: Secondary | ICD-10-CM | POA: Diagnosis not present

## 2018-09-10 DIAGNOSIS — N2581 Secondary hyperparathyroidism of renal origin: Secondary | ICD-10-CM | POA: Diagnosis not present

## 2018-09-10 DIAGNOSIS — F322 Major depressive disorder, single episode, severe without psychotic features: Secondary | ICD-10-CM | POA: Diagnosis not present

## 2018-09-10 DIAGNOSIS — N186 End stage renal disease: Secondary | ICD-10-CM | POA: Diagnosis not present

## 2018-09-10 DIAGNOSIS — Z992 Dependence on renal dialysis: Secondary | ICD-10-CM | POA: Diagnosis not present

## 2018-09-10 LAB — RENAL FUNCTION PANEL
Albumin: 3.1 g/dL — ABNORMAL LOW (ref 3.5–5.0)
Anion gap: 13 (ref 5–15)
BUN: 56 mg/dL — ABNORMAL HIGH (ref 6–20)
CHLORIDE: 101 mmol/L (ref 98–111)
CO2: 23 mmol/L (ref 22–32)
Calcium: 8.2 mg/dL — ABNORMAL LOW (ref 8.9–10.3)
Creatinine, Ser: 14.33 mg/dL — ABNORMAL HIGH (ref 0.61–1.24)
GFR calc Af Amer: 4 mL/min — ABNORMAL LOW (ref >60)
GFR calc non Af Amer: 4 mL/min — ABNORMAL LOW (ref >60)
Glucose, Bld: 131 mg/dL — ABNORMAL HIGH (ref 70–99)
Phosphorus: 5.5 mg/dL — ABNORMAL HIGH (ref 2.5–4.6)
Potassium: 4.6 mmol/L (ref 3.5–5.1)
Sodium: 137 mmol/L (ref 135–145)

## 2018-09-10 LAB — CBC
HCT: 29.9 % — ABNORMAL LOW (ref 39.0–52.0)
Hemoglobin: 9.7 g/dL — ABNORMAL LOW (ref 13.0–17.0)
MCH: 30.9 pg (ref 26.0–34.0)
MCHC: 32.4 g/dL (ref 30.0–36.0)
MCV: 95.2 fL (ref 80.0–100.0)
Platelets: 101 10*3/uL — ABNORMAL LOW (ref 150–400)
RBC: 3.14 MIL/uL — ABNORMAL LOW (ref 4.22–5.81)
RDW: 14.9 % (ref 11.5–15.5)
WBC: 4 10*3/uL (ref 4.0–10.5)
nRBC: 0 % (ref 0.0–0.2)

## 2018-09-10 MED ORDER — CALCITRIOL 0.25 MCG PO CAPS
ORAL_CAPSULE | ORAL | Status: AC
  Filled 2018-09-10: qty 1

## 2018-09-10 MED ORDER — CHLORHEXIDINE GLUCONATE CLOTH 2 % EX PADS: 6.0000 | MEDICATED_PAD | Freq: Every day | CUTANEOUS | Status: DC

## 2018-09-10 NOTE — Progress Notes (Signed)
[facility] ROUNDING NOTE   Subjective:   Brief summary 48 year old end-stage renal disease Monday Wednesday Friday dialysis missing dialysis treatments due to homelessness.  Received urgent dialysis 09/06/2018.  Patient refused to attend his dialysis treatment scheduled for 09/08/2018.  This morning he has a sitter in the room.  He is agreeable to treatment 09/10/2018.  He refused dialysis 09/09/2018  Evaluated by psychiatry and plan for movement to inpatient psychiatric unit possibly at [facility].  Awaiting bed  Blood pressure 134/78 pulse 61 temperature 97.7.  O2 sats 95% room air  No labs  Medications amlodipine 10 mg daily Coreg 25 mg twice daily, hydralazine/isosorbide 1 tablet 3 times daily, pravastatin 20 mg daily Lexapro 10 mg daily trazodone 50 mg nightly Calcitrol 0.25 mcg Monday Wednesday Friday PhosLo 1334 mg 3 times daily, Fosrenol 1 g 3 times daily, Keppra 500 mg twice daily, multivitamins 1 daily   patient refusing binders  Objective:  Vital signs in last 24 hours:  Temp:  [97.7 F (36.5 C)] 97.7 F (36.5 C) (03/12 2150) Pulse Rate:  [61-62] 61 (03/12 2150) Resp:  [20] 20 (03/12 2150) BP: (134)/(78) 134/78 (03/12 2150) SpO2:  [94 %-95 %] 95 % (03/12 2150) Weight:  [95.1 kg] 95.1 kg (03/12 2150)  Weight change:  Filed Weights   09/06/18 1230 09/06/18 1715 09/09/18 2150  Weight: 95.1 kg 92.5 kg 95.1 kg    Intake/Output: I/O last 3 completed shifts: In: 840 [P.O.:840] Out: -    Intake/Output this shift:  Total I/O In: 360 [P.O.:360] Out: -   CVS-regular rate and rhythm faint systolic murmur left sternal edge RS-clear to auscultation no wheezes or rales ABD- BS present soft non-distended EXT- no edema   Basic Metabolic Panel: Recent Labs  Lab 09/06/18 0011 09/07/18 0546  NA 143 138  K 4.1 4.4  CL 105 103  CO2 24 26  GLUCOSE 111* 77  BUN 50* 20  CREATININE 13.17* 7.61*  CALCIUM 8.5* 7.8*    Liver Function Tests: No  results for input(s): AST, ALT, ALKPHOS, BILITOT, PROT, ALBUMIN in the last 168 hours. No results for input(s): LIPASE, AMYLASE in the last 168 hours. No results for input(s): AMMONIA in the last 168 hours.  CBC: Recent Labs  Lab 09/06/18 0011 09/07/18 0546  WBC 6.1 4.9  HGB 11.9* 9.8*  HCT 35.8* 28.6*  MCV 96.5 95.0  PLT 128* 102*    Cardiac Enzymes: No results for input(s): CKTOTAL, CKMB, CKMBINDEX, TROPONINI in the last 168 hours.  BNP: Invalid input(s): POCBNP  CBG: Recent Labs  Lab 09/06/18 1109 09/06/18 1110 09/06/18 2131 09/07/18 0650 09/07/18 1128  GLUCAP 83 86 94 75 97    Microbiology: Results for orders placed or performed during the hospital encounter of 09/05/18  Gastrointestinal Panel by PCR , Stool     Status: None   Collection Time: 09/06/18  6:47 PM  Result Value Ref Range Status   Campylobacter species NOT DETECTED NOT DETECTED Final   Plesimonas shigelloides NOT DETECTED NOT DETECTED Final   Salmonella species NOT DETECTED NOT DETECTED Final   Yersinia enterocolitica NOT DETECTED NOT DETECTED Final   Vibrio species NOT DETECTED NOT DETECTED Final   Vibrio cholerae NOT DETECTED NOT DETECTED Final   Enteroaggregative E coli (EAEC) NOT DETECTED NOT DETECTED Final   Enteropathogenic E coli (EPEC) NOT DETECTED NOT DETECTED Final   Enterotoxigenic E coli (ETEC) NOT DETECTED NOT DETECTED Final   Shiga like toxin producing E coli (STEC) NOT DETECTED  NOT DETECTED Final   Shigella/Enteroinvasive E coli (EIEC) NOT DETECTED NOT DETECTED Final   Cryptosporidium NOT DETECTED NOT DETECTED Final   Cyclospora cayetanensis NOT DETECTED NOT DETECTED Final   Entamoeba histolytica NOT DETECTED NOT DETECTED Final   Giardia lamblia NOT DETECTED NOT DETECTED Final   Adenovirus F40/41 NOT DETECTED NOT DETECTED Final   Astrovirus NOT DETECTED NOT DETECTED Final   Norovirus GI/GII NOT DETECTED NOT DETECTED Final   Rotavirus A NOT DETECTED NOT DETECTED Final   Sapovirus  (I, II, IV, and V) NOT DETECTED NOT DETECTED Final    Comment: Performed at [facility], [address] [place], [place] [address]    Coagulation Studies: No results for input(s): LABPROT, INR in the last 72 hours.  Urinalysis: No results for input(s): COLORURINE, LABSPEC, PHURINE, GLUCOSEU, HGBUR, BILIRUBINUR, KETONESUR, PROTEINUR, UROBILINOGEN, NITRITE, LEUKOCYTESUR in the last 72 hours.  Invalid input(s): APPERANCEUR    Imaging: No results found.   Medications:    . amLODipine  10 mg Oral Daily  . calcitRIOL  0.25 mcg Oral Q M,W,F  . calcium acetate  1,334 mg Oral TID WC  . carvedilol  25 mg Oral BID WC  . Chlorhexidine Gluconate Cloth  6 each Topical Q0600  . Chlorhexidine Gluconate Cloth  6 each Topical Q0600  . Chlorhexidine Gluconate Cloth  6 each Topical Q0600  . Chlorhexidine Gluconate Cloth  6 each Topical Q0600  . escitalopram  10 mg Oral Daily  . heparin  5,000 Units Subcutaneous Q8H  . isosorbide-hydrALAZINE  1 tablet Oral TID  . lanthanum  1,000 mg Oral TID WC  . levETIRAcetam  500 mg Oral BID  . mometasone-formoterol  2 puff Inhalation BID  . multivitamin  1 tablet Oral QHS  . nitroGLYCERIN  1 inch Topical Q6H  . pravastatin  20 mg Oral QPM   acetaminophen **OR** acetaminophen, albuterol, hydrALAZINE, ondansetron **OR** ondansetron (ZOFRAN) IV, traZODone  Assessment/ Plan:   ESRD-under Wednesday Friday dialysis [facility] frequent admissions to emergency room for volume overload.  Patient again refused dialysis 09/09/2018.  He states that he will go for dialysis 09/10/2018.  Orders are written  ANEMIA-hemoglobin less than 10 we will check iron stores  MBD-stable appears the patient is refusing his binders  HTN/VOL-some mild volume overload on receive dialysis 09/06/2018, dialysis scheduled.  Isosorbide/hydralazine 1 tablet 3 times daily.  Amlodipine 10 mg daily and Coreg 25 mg twice daily  ACCESS-IJ catheter  Psychiatry patient  suicidal needing inpatient psychiatric admission.  Lexapro 10 mg daily and trazodone 50 mg nightly.  Hyperlipidemia continues on pravastatin 20 mg daily  Seizure disorder  continue 500 mg Keppra daily  LOS: 3 Sherril Croon @TODAY @9 :40 AM

## 2018-09-10 NOTE — Progress Notes (Signed)
[facility] Progress Note  Patient: George [name] EPP:[id number]   PCP: Shelby Mattocks, PA-C DOB: 07-23-1970   DOA: 09/05/2018   DOS: 09/10/2018   Date of Service: the patient was seen and examined on 09/10/2018  Brief hospital course: Pt. with PMH of ESRD on hemodialysis missed his dialysis last 2 sessions on Wednesday and Friday was discharged home on August 30, 2018 have to be admitted for respiratory failure secondary to fluid overload presents to the ER with complaints of shortness of breath and headache.  Patient states over the last 2 days patient has been having shortness of breath increased on exertion Currently further plan is identify safe discharge location and inpatient psychiatric facility.  Patient is medically cleared to be discharged from the hospital.  Agreeable for hemodialysis  Subjective: No acute complaint no nausea no vomiting.  Agreeable for new HD today.  Assessment and Plan: 1. Acute respiratory failure with hypoxia likely from fluid overload secondary to missing dialysis -nephrology has been consulted for dialysis.  Closely follow respiratory status. 2. Hypertension uncontrolled improve with dialysis.  Continue home medications including amlodipine BiDil and Coreg.  PRN IV hydralazine for now. 3. Headache resolved.  Likely from uncontrolled hypertension.  CT head unremarkable. 4. History of stroke on statins. 5. History of seizure on Keppra. 6. Anemia likely from ESRD.  Follow CBC. 7. History of depression on Lexapro.  Suicidal ideation.   Patient did exhibit suicidal ideation.  Psychiatry was consulted.  Recommended inpatient psychiatric admission. 8. Mild thrombocytopenia follow CBC. 9. Bilateral expiratory wheezing. Likely secondary to volume overload. No evidence of active infection for now. Continue albuterol inhaler.  Add Dulera  Diet: RENALD IET DVT Prophylaxis: subcutaneous Heparin  Advance goals of care discussion: full code  Family  Communication: no family was present at bedside, at the time of interview.  Disposition:  Discharge to [facility].  Consultants: Nephrology, psychiatry Procedures: HD  Scheduled Meds: . amLODipine  10 mg Oral Daily  . calcitRIOL      . calcitRIOL  0.25 mcg Oral Q M,W,F  . calcium acetate  1,334 mg Oral TID WC  . carvedilol  25 mg Oral BID WC  . Chlorhexidine Gluconate Cloth  6 each Topical Q0600  . Chlorhexidine Gluconate Cloth  6 each Topical Q0600  . Chlorhexidine Gluconate Cloth  6 each Topical Q0600  . Chlorhexidine Gluconate Cloth  6 each Topical Q0600  . escitalopram  10 mg Oral Daily  . heparin  5,000 Units Subcutaneous Q8H  . isosorbide-hydrALAZINE  1 tablet Oral TID  . lanthanum  1,000 mg Oral TID WC  . levETIRAcetam  500 mg Oral BID  . mometasone-formoterol  2 puff Inhalation BID  . multivitamin  1 tablet Oral QHS  . nitroGLYCERIN  1 inch Topical Q6H  . pravastatin  20 mg Oral QPM   Continuous Infusions: PRN Meds: acetaminophen **OR** acetaminophen, albuterol, hydrALAZINE, ondansetron **OR** ondansetron (ZOFRAN) IV, traZODone Antibiotics: Anti-infectives (From admission, onward)   Start     Dose/Rate Route Frequency Ordered Stop   09/06/18 1000  clindamycin (CLEOCIN) capsule 300 mg     300 mg Oral 3 times daily 09/06/18 0509 09/06/18 2120       Objective: Physical Exam: Vitals:   09/10/18 1130 09/10/18 1200 09/10/18 1230 09/10/18 1317  BP: (!) 166/101 (!) 163/97 (!) 171/107 (!) 149/79  Pulse: 67 68 69 70  Resp:    18  Temp:    98.1 F (36.7 C)  TempSrc:    Oral  SpO2:    96%  Weight:      Height:        Intake/Output Summary (Last 24 hours) at 09/10/2018 1351 Last data filed at 09/10/2018 1235 Gross per 24 hour  Intake 840 ml  Output 3000 ml  Net -2160 ml   Filed Weights   09/06/18 1715 09/09/18 2150 09/10/18 0814  Weight: 92.5 kg 95.1 kg 94.6 kg   General: Alert, Awake and Oriented to Time, Place and Person. Appear in mild distress, affect appropriate  Eyes: PERRL, Conjunctiva normal ENT: Oral Mucosa clear moist. Neck: no JVD, no Abnormal Mass Or lumps Cardiovascular: S1 and S2 Present, no Murmur, Peripheral Pulses Present Respiratory: normal respiratory effort, Bilateral Air entry equal and Decreased, no use of accessory muscle, no Crackles, bilateral  wheezes Abdomen: Bowel Sound present, Soft and n tenderness, ono hernia Skin: no redness, n Rash, ono induration Extremities: no Pedal edema, no calf tenderness Neurologic: Grossly no focal neuro deficit. Bilaterally Equal motor strength  Data Reviewed: CBC: Recent Labs  Lab 09/06/18 0011 09/07/18 0546 09/10/18 0943  WBC 6.1 4.9 4.0  HGB 11.9* 9.8* 9.7*  HCT 35.8* 28.6* 29.9*  MCV 96.5 95.0 95.2  PLT 128* 102* 428*   Basic Metabolic Panel: Recent Labs  Lab 09/06/18 0011 09/07/18 0546 09/10/18 0944  NA 143 138 137  K 4.1 4.4 4.6  CL 105 103 101  CO2 24 26 23   GLUCOSE 111* 77 131*  BUN 50* 20 56*  CREATININE 13.17* 7.61* 14.33*  CALCIUM 8.5* 7.8* 8.2*  PHOS  --   --  5.5*    Liver Function Tests: Recent Labs  Lab 09/10/18 0944  ALBUMIN 3.1*   No results for input(s): LIPASE, AMYLASE in the last 168 hours. No results for input(s): AMMONIA in the last 168 hours. Coagulation Profile: No results for input(s): INR, PROTIME in the last 168 hours. Cardiac Enzymes: No results for input(s): CKTOTAL, CKMB, CKMBINDEX, TROPONINI in the last 168 hours. BNP (last 3 results) No results for input(s): PROBNP in the last 8760 hours. CBG: Recent Labs  Lab 09/06/18 1109 09/06/18 1110 09/06/18 2131 09/07/18 0650 09/07/18 1128  GLUCAP 83 86 94 75 97   Studies: No results found.   Time spent: 35 minutes  Author: Berle Mull, MD [facility] 09/10/2018 1:51 PM  To reach On-call, see care teams to locate the attending and reach out to them via [email or web address]. If 7PM-7AM, please contact night-coverage If you still have difficulty reaching the attending provider,  please page the [facility] (Director on Call) for [facility] on amion for assistance.

## 2018-09-11 DIAGNOSIS — Z992 Dependence on renal dialysis: Secondary | ICD-10-CM | POA: Diagnosis not present

## 2018-09-11 DIAGNOSIS — F322 Major depressive disorder, single episode, severe without psychotic features: Secondary | ICD-10-CM | POA: Diagnosis not present

## 2018-09-11 DIAGNOSIS — D631 Anemia in chronic kidney disease: Secondary | ICD-10-CM | POA: Diagnosis not present

## 2018-09-11 DIAGNOSIS — N2581 Secondary hyperparathyroidism of renal origin: Secondary | ICD-10-CM | POA: Diagnosis not present

## 2018-09-11 DIAGNOSIS — N186 End stage renal disease: Secondary | ICD-10-CM | POA: Diagnosis not present

## 2018-09-11 DIAGNOSIS — I120 Hypertensive chronic kidney disease with stage 5 chronic kidney disease or end stage renal disease: Secondary | ICD-10-CM | POA: Diagnosis not present

## 2018-09-11 NOTE — Progress Notes (Signed)
PROGRESS NOTE    Patient: George [name]                            PCP: Shelby Mattocks, PA-C                    DOB: [birth year]            DOA: 09/05/2018 [id number]             DOS: 09/11/2018, 11:15 AM   LOS: 4 days   Date of Service: The patient was seen and examined on 09/11/2018  Subjective:   Patient was seen and examined this morning, stable no acute distress.  Reporting that he is ready to be discharged.Marland Kitchen He finally agreed for hemodialysis yesterday  Brief Narrative:   Pt. with PMH of ESRD on hemodialysis missed his dialysis last 2 sessions on Wednesday and Friday was discharged home on August 30, 2018 have to be admitted for respiratory failure secondary to fluid overload presents to the ER with complaints of shortness of breath and headache. Patient states over the last 2 days patient has been having shortness of breath increased on exertion Currently further plan is identify safe discharge location and inpatient psychiatric facility.   Patient is medically cleared to be discharged from the hospital.  Agreeable for hemodialysis  Principal Problem:   MDD (major depressive disorder), single episode, severe , no psychosis ([place]) Active Problems:   ESRD (end stage renal disease) on dialysis ([place])   Hemiparesis affecting left side as late effect of stroke ([place])   Hypertensive urgency   Acute respiratory failure with hypoxia ([place])    Assessment & Plan:   Acute respiratory failure with hypoxia -  likely from fluid overload secondary to missing dialysis-nephrology has been consulted for dialysis. Closely follow respiratory status. Currently on room air, respiratory distress has resolved  Hypertension uncontrolled - improve with dialysis. Continue home medications including amlodipine BiDil and Coreg. PRN IV hydralazine for now.  Headache resolved. Likely from uncontrolled hypertension. CT head unremarkable.  History of stroke on statins.  History of  seizure on Keppra.  Anemia likely from ESRD.Follow CBC.  History of depression on Lexapro.  Suicidal ideation.  Patient did exhibit suicidal ideation.  Psychiatry was consulted.  Recommended inpatient psychiatric admission.  Mild thrombocytopenia follow CBC.  Bilateral expiratory wheezing. Likely secondary to volume overload.  No evidence of active infection for now. Continue albuterol inhaler.  Add Dulera  Diet: RENALD IET DVT Prophylaxis: subcutaneous Heparin  Advance goals of care discussion: full code  Family Communication: no family was present at bedside, at the time of interview.  Disposition:  Discharge to [facility].... Pending bed availability at [facility] psych unit  Consultants: Nephrology, psychiatry Procedures: HD   Procedures:     Hemo-dialysis  Antimicrobials:  Anti-infectives (From admission, onward)   Start     Dose/Rate Route Frequency Ordered Stop   09/06/18 1000  clindamycin (CLEOCIN) capsule 300 mg     300 mg Oral 3 times daily 09/06/18 0509 09/06/18 2120       Medication:  . amLODipine  10 mg Oral Daily  . calcitRIOL  0.25 mcg Oral Q M,W,F  . calcium acetate  1,334 mg Oral TID WC  . carvedilol  25 mg Oral BID WC  . Chlorhexidine Gluconate Cloth  6 each Topical Q0600  . Chlorhexidine Gluconate Cloth  6 each Topical Q0600  . Chlorhexidine Gluconate Cloth  6 each Topical  [id number]  . Chlorhexidine Gluconate Cloth  6 each Topical Q0600  . escitalopram  10 mg Oral Daily  . heparin  5,000 Units Subcutaneous Q8H  . isosorbide-hydrALAZINE  1 tablet Oral TID  . lanthanum  1,000 mg Oral TID WC  . levETIRAcetam  500 mg Oral BID  . mometasone-formoterol  2 puff Inhalation BID  . multivitamin  1 tablet Oral QHS  . nitroGLYCERIN  1 inch Topical Q6H  . pravastatin  20 mg Oral QPM    acetaminophen **OR** acetaminophen, albuterol, hydrALAZINE, ondansetron **OR** ondansetron (ZOFRAN) IV, traZODone     Objective:   Vitals:   09/10/18 1317 09/10/18  2037 09/10/18 2237 09/11/18 0522  BP: (!) 149/79  (!) 154/102 124/74  Pulse: 70  62 67  Resp: 18  16 18   Temp: 98.1 F (36.7 C)  97.7 F (36.5 C) (!) 97.5 F (36.4 C)  TempSrc: Oral  Oral Oral  SpO2: 96% 95% 99% 97%  Weight:      Height:        Intake/Output Summary (Last 24 hours) at 09/11/2018 1115 Last data filed at 09/11/2018 0906 Gross per 24 hour  Intake 700 ml  Output 3000 ml  Net -2300 ml   Filed Weights   09/06/18 1715 09/09/18 2150 09/10/18 0814  Weight: 92.5 kg 95.1 kg 94.6 kg     Examination:    General exam: Appears calm and comfortable  BP 124/74 (BP Location: Left Arm)   Pulse 67   Temp (!) 97.5 F (36.4 C) (Oral)   Resp 18   Ht 5\' 9"  (1.753 m)   Wt 94.6 kg   SpO2 97%   BMI 30.80 kg/m    Physical Exam  Constitution:  Alert, cooperative, no distress,  Psychiatric: Normal and stable mood and affect, cognition intact,   HEENT: Normocephalic, PERRL, otherwise with in Normal limits  Chest:Chest symmetric Cardio vascular:  S1/S2, RRR, No murmure, No Rubs or Gallops  pulmonary: Clear to auscultation bilaterally, respirations unlabored, negative wheezes / crackles Abdomen: Soft, non-tender, non-distended, bowel sounds,no masses, no organomegaly Muscular skeletal: Limited exam - in bed, able to move all 4 extremities, Normal strength,  Neuro: CNII-XII intact. , normal motor and sensation, reflexes intact  Extremities: No pitting edema lower extremities, +2 pulses  Skin: Dry, warm to touch, negative for any Rashes, No open wounds Wounds: per nursing documentation  LABs:  CBC Latest Ref Rng & Units 09/10/2018 09/07/2018 09/06/2018  WBC 4.0 - 10.5 K/uL 4.0 4.9 6.1  Hemoglobin 13.0 - 17.0 g/dL 9.7(L) 9.8(L) 11.9(L)  Hematocrit 39.0 - 52.0 % 29.9(L) 28.6(L) 35.8(L)  Platelets 150 - 400 K/uL 101(L) 102(L) 128(L)   CMP Latest Ref Rng & Units 09/10/2018 09/07/2018 09/06/2018  Glucose 70 - 99 mg/dL 131(H) 77 111(H)  BUN 6 - 20 mg/dL 56(H) 20 50(H)  Creatinine 0.61  - 1.24 mg/dL 14.33(H) 7.61(H) 13.17(H)  Sodium 135 - 145 mmol/L 137 138 143  Potassium 3.5 - 5.1 mmol/L 4.6 4.4 4.1  Chloride 98 - 111 mmol/L 101 103 105  CO2 22 - 32 mmol/L 23 26 24   Calcium 8.9 - 10.3 mg/dL 8.2(L) 7.8(L) 8.5(L)  Total Protein 6.5 - 8.1 g/dL - - -  Total Bilirubin 0.3 - 1.2 mg/dL - - -  Alkaline Phos 38 - 126 U/L - - -  AST 15 - 41 U/L - - -  ALT 17 - 63 U/L - - -

## 2018-09-11 NOTE — Progress Notes (Signed)
[facility] ROUNDING NOTE   Subjective:   Brief summary 48 year old end-stage renal disease Monday Wednesday Friday dialysis missing dialysis treatments due to homelessness.  Received urgent dialysis 09/06/2018.  Patient refused to attend his dialysis treatment scheduled for 09/08/2018.  This morning he has a sitter in the room.  He is agreeable to treatment 09/10/2018.  He refused dialysis 09/09/2018.  Patient received dialysis 09/09/2028  Evaluated by psychiatry and plan for movement to inpatient psychiatric unit possibly at [facility].  Awaiting bed  Blood pressure 124/74 pulse 87 temperature 97.5 O2 sats 97% room air  Ultrafiltration 3 L weight 94.6 kg  Sodium 137 potassium 4.6 chloride 101 CO2 23 BUN 56 creatinine 14.33 glucose 131 calcium 8.2 phosphorus 5.5 albumin 3.1 WBC 4.0 hemoglobin 9.7 platelets 101  Medications amlodipine 10 mg daily Coreg 25 mg twice daily, hydralazine/isosorbide 1 tablet 3 times daily, pravastatin 20 mg daily Lexapro 10 mg daily trazodone 50 mg nightly Calcitrol 0.25 mcg Monday Wednesday Friday PhosLo 1334 mg 3 times daily, Fosrenol 1 g 3 times daily, Keppra 500 mg twice daily, multivitamins 1 daily   patient refusing binders  Objective:  Vital signs in last 24 hours:  Temp:  [97.5 F (36.4 C)-98.1 F (36.7 C)] 97.5 F (36.4 C) (03/14 0522) Pulse Rate:  [62-70] 67 (03/14 0522) Resp:  [16-18] 18 (03/14 0522) BP: (124-171)/(74-107) 124/74 (03/14 0522) SpO2:  [95 %-99 %] 97 % (03/14 0522)  Weight change: -0.5 kg Filed Weights   09/06/18 1715 09/09/18 2150 09/10/18 0814  Weight: 92.5 kg 95.1 kg 94.6 kg    Intake/Output: I/O last 3 completed shifts: In: 4 [P.O.:820] Out: 3000 [Other:3000]   Intake/Output this shift:  Total I/O In: 240 [P.O.:240] Out: 0   CVS-regular rate and rhythm faint systolic murmur left sternal edge RS-clear to auscultation no wheezes or rales ABD- BS present soft non-distended EXT- no  edema   Basic Metabolic Panel: Recent Labs  Lab 09/06/18 0011 09/07/18 0546 09/10/18 0944  NA 143 138 137  K 4.1 4.4 4.6  CL 105 103 101  CO2 24 26 23   GLUCOSE 111* 77 131*  BUN 50* 20 56*  CREATININE 13.17* 7.61* 14.33*  CALCIUM 8.5* 7.8* 8.2*  PHOS  --   --  5.5*    Liver Function Tests: Recent Labs  Lab 09/10/18 0944  ALBUMIN 3.1*   No results for input(s): LIPASE, AMYLASE in the last 168 hours. No results for input(s): AMMONIA in the last 168 hours.  CBC: Recent Labs  Lab 09/06/18 0011 09/07/18 0546 09/10/18 0943  WBC 6.1 4.9 4.0  HGB 11.9* 9.8* 9.7*  HCT 35.8* 28.6* 29.9*  MCV 96.5 95.0 95.2  PLT 128* 102* 101*    Cardiac Enzymes: No results for input(s): CKTOTAL, CKMB, CKMBINDEX, TROPONINI in the last 168 hours.  BNP: Invalid input(s): POCBNP  CBG: Recent Labs  Lab 09/06/18 1109 09/06/18 1110 09/06/18 2131 09/07/18 0650 09/07/18 1128  GLUCAP 83 86 94 75 97    Microbiology: Results for orders placed or performed during the hospital encounter of 09/05/18  Gastrointestinal Panel by PCR , Stool     Status: None   Collection Time: 09/06/18  6:47 PM  Result Value Ref Range Status   Campylobacter species NOT DETECTED NOT DETECTED Final   Plesimonas shigelloides NOT DETECTED NOT DETECTED Final   Salmonella species NOT DETECTED NOT DETECTED Final   Yersinia enterocolitica NOT DETECTED NOT DETECTED Final   Vibrio species NOT DETECTED NOT DETECTED Final  Vibrio cholerae NOT DETECTED NOT DETECTED Final   Enteroaggregative E coli (EAEC) NOT DETECTED NOT DETECTED Final   Enteropathogenic E coli (EPEC) NOT DETECTED NOT DETECTED Final   Enterotoxigenic E coli (ETEC) NOT DETECTED NOT DETECTED Final   Shiga like toxin producing E coli (STEC) NOT DETECTED NOT DETECTED Final   Shigella/Enteroinvasive E coli (EIEC) NOT DETECTED NOT DETECTED Final   Cryptosporidium NOT DETECTED NOT DETECTED Final   Cyclospora cayetanensis NOT DETECTED NOT DETECTED Final    Entamoeba histolytica NOT DETECTED NOT DETECTED Final   Giardia lamblia NOT DETECTED NOT DETECTED Final   Adenovirus F40/41 NOT DETECTED NOT DETECTED Final   Astrovirus NOT DETECTED NOT DETECTED Final   Norovirus GI/GII NOT DETECTED NOT DETECTED Final   Rotavirus A NOT DETECTED NOT DETECTED Final   Sapovirus (I, II, IV, and V) NOT DETECTED NOT DETECTED Final    Comment: Performed at [facility], [address] [place], [place] [address]    Coagulation Studies: No results for input(s): LABPROT, INR in the last 72 hours.  Urinalysis: No results for input(s): COLORURINE, LABSPEC, PHURINE, GLUCOSEU, HGBUR, BILIRUBINUR, KETONESUR, PROTEINUR, UROBILINOGEN, NITRITE, LEUKOCYTESUR in the last 72 hours.  Invalid input(s): APPERANCEUR    Imaging: No results found.   Medications:    . amLODipine  10 mg Oral Daily  . calcitRIOL  0.25 mcg Oral Q M,W,F  . calcium acetate  1,334 mg Oral TID WC  . carvedilol  25 mg Oral BID WC  . Chlorhexidine Gluconate Cloth  6 each Topical Q0600  . Chlorhexidine Gluconate Cloth  6 each Topical Q0600  . Chlorhexidine Gluconate Cloth  6 each Topical Q0600  . Chlorhexidine Gluconate Cloth  6 each Topical Q0600  . escitalopram  10 mg Oral Daily  . heparin  5,000 Units Subcutaneous Q8H  . isosorbide-hydrALAZINE  1 tablet Oral TID  . lanthanum  1,000 mg Oral TID WC  . levETIRAcetam  500 mg Oral BID  . mometasone-formoterol  2 puff Inhalation BID  . multivitamin  1 tablet Oral QHS  . nitroGLYCERIN  1 inch Topical Q6H  . pravastatin  20 mg Oral QPM   acetaminophen **OR** acetaminophen, albuterol, hydrALAZINE, ondansetron **OR** ondansetron (ZOFRAN) IV, traZODone  Assessment/ Plan:   ESRD-Monday Wednesday Friday dialysis [facility] frequent admissions to emergency room for volume overload.  Patient again refused dialysis 09/09/2018.  Patient tolerated dialysis 09/10/2018 next dialysis treatment will be 09/13/2018  ANEMIA-hemoglobin less than  10 we will check iron stores  MBD-stable appears the patient is refusing his binders  HTN/VOL-Next dialysis dialysis schedule 13 September 2018 patient continues on isosorbide/hydralazine 1 tablet 3 times daily.  Amlodipine 10 mg daily and Coreg 25 mg twice daily  ACCESS-IJ catheter  Psychiatry patient suicidal needing inpatient psychiatric admission.  Lexapro 10 mg daily and trazodone 50 mg nightly.  Hyperlipidemia continues on pravastatin 20 mg daily  Seizure disorder  continue 500 mg Keppra daily  LOS: 4 Sherril Croon @TODAY @10 :59 AM

## 2018-09-12 DIAGNOSIS — I132 Hypertensive heart and chronic kidney disease with heart failure and with stage 5 chronic kidney disease, or end stage renal disease: Secondary | ICD-10-CM | POA: Diagnosis not present

## 2018-09-12 DIAGNOSIS — I69354 Hemiplegia and hemiparesis following cerebral infarction affecting left non-dominant side: Secondary | ICD-10-CM | POA: Diagnosis not present

## 2018-09-12 DIAGNOSIS — F322 Major depressive disorder, single episode, severe without psychotic features: Secondary | ICD-10-CM | POA: Diagnosis not present

## 2018-09-12 DIAGNOSIS — I5033 Acute on chronic diastolic (congestive) heart failure: Secondary | ICD-10-CM | POA: Diagnosis not present

## 2018-09-12 DIAGNOSIS — J9601 Acute respiratory failure with hypoxia: Secondary | ICD-10-CM | POA: Diagnosis not present

## 2018-09-12 DIAGNOSIS — R69 Illness, unspecified: Secondary | ICD-10-CM | POA: Diagnosis not present

## 2018-09-12 DIAGNOSIS — I120 Hypertensive chronic kidney disease with stage 5 chronic kidney disease or end stage renal disease: Secondary | ICD-10-CM | POA: Diagnosis not present

## 2018-09-12 DIAGNOSIS — D631 Anemia in chronic kidney disease: Secondary | ICD-10-CM | POA: Diagnosis not present

## 2018-09-12 DIAGNOSIS — N186 End stage renal disease: Secondary | ICD-10-CM | POA: Diagnosis not present

## 2018-09-12 DIAGNOSIS — E1122 Type 2 diabetes mellitus with diabetic chronic kidney disease: Secondary | ICD-10-CM | POA: Diagnosis not present

## 2018-09-12 DIAGNOSIS — Z992 Dependence on renal dialysis: Secondary | ICD-10-CM | POA: Diagnosis not present

## 2018-09-12 DIAGNOSIS — N2581 Secondary hyperparathyroidism of renal origin: Secondary | ICD-10-CM | POA: Diagnosis not present

## 2018-09-12 DIAGNOSIS — R0602 Shortness of breath: Secondary | ICD-10-CM | POA: Diagnosis not present

## 2018-09-12 MED ORDER — BUDESONIDE 0.25 MG/2ML IN SUSP
0.2500 mg | Freq: Two times a day (BID) | RESPIRATORY_TRACT | Status: DC
  Administered 2018-09-12 – 2018-09-13 (×3): 0.25 mg via RESPIRATORY_TRACT
  Filled 2018-09-12 (×7): qty 2

## 2018-09-12 MED ORDER — CHLORHEXIDINE GLUCONATE CLOTH 2 % EX PADS: 6.0000 | MEDICATED_PAD | Freq: Every day | CUTANEOUS | Status: DC

## 2018-09-12 MED ORDER — IPRATROPIUM-ALBUTEROL 0.5-2.5 (3) MG/3ML IN SOLN
3.0000 mL | Freq: Four times a day (QID) | RESPIRATORY_TRACT | Status: DC
  Administered 2018-09-12: 3 mL via RESPIRATORY_TRACT
  Filled 2018-09-12: qty 3

## 2018-09-12 NOTE — Progress Notes (Signed)
[name] ROUNDING NOTE   Subjective:   Brief summary 48 year old end-stage renal disease Monday Wednesday Friday dialysis missing dialysis treatments due to homelessness.  Received urgent dialysis 09/06/2018.  Patient refused to attend his dialysis treatment scheduled for 09/08/2018.  This morning he has a sitter in the room.  He is agreeable to treatment 09/10/2018.  He refused dialysis 09/09/2018.  Patient received dialysis 09/10/2018.  His next dialysis session will be scheduled for 09/13/2018  Evaluated by psychiatry and plan for movement to inpatient psychiatric unit possibly at [facility].  Awaiting bed  Blood pressure 135/93 pulse 64 temperature 98.1 O2 sats 96% room air  Ultrafiltration 3 L weight 94.6 kg  Sodium 137 potassium 4.6 chloride 101 CO2 23 BUN 56 creatinine 14.33 calcium 8.2 phosphorus 5.5 albumin 3.1 WBC 4.0 hemoglobin 9.7 platelets 101  Medications amlodipine 10 mg daily Coreg 25 mg twice daily, hydralazine/isosorbide 1 tablet 3 times daily, pravastatin 20 mg daily Lexapro 10 mg daily trazodone 50 mg nightly Calcitrol 0.25 mcg Monday Wednesday Friday PhosLo 1334 mg 3 times daily, Fosrenol 1 g 3 times daily, Keppra 500 mg twice daily, multivitamins 1 daily   patient refusing binders  Objective:  Vital signs in last 24 hours:  Temp:  [97.8 F (36.6 C)-98.5 F (36.9 C)] 98.1 F (36.7 C) (03/15 0824) Pulse Rate:  [56-64] 64 (03/15 0824) Resp:  [18] 18 (03/15 0824) BP: (126-155)/(80-93) 155/93 (03/15 0824) SpO2:  [92 %-96 %] 92 % (03/15 0910)  Weight change:  Filed Weights   09/06/18 1715 09/09/18 2150 09/10/18 0814  Weight: 92.5 kg 95.1 kg 94.6 kg    Intake/Output: I/O last 3 completed shifts: In: 1060 [P.O.:1060] Out: 0    Intake/Output this shift:  Total I/O In: 240 [P.O.:240] Out: -   CVS-regular rate and rhythm faint systolic murmur left sternal edge RS-clear to auscultation no wheezes or rales ABD- BS present soft  non-distended EXT- no edema   Basic Metabolic Panel: Recent Labs  Lab 09/06/18 0011 09/07/18 0546 09/10/18 0944  NA 143 138 137  K 4.1 4.4 4.6  CL 105 103 101  CO2 24 26 23   GLUCOSE 111* 77 131*  BUN 50* 20 56*  CREATININE 13.17* 7.61* 14.33*  CALCIUM 8.5* 7.8* 8.2*  PHOS  --   --  5.5*    Liver Function Tests: Recent Labs  Lab 09/10/18 0944  ALBUMIN 3.1*   No results for input(s): LIPASE, AMYLASE in the last 168 hours. No results for input(s): AMMONIA in the last 168 hours.  CBC: Recent Labs  Lab 09/06/18 0011 09/07/18 0546 09/10/18 0943  WBC 6.1 4.9 4.0  HGB 11.9* 9.8* 9.7*  HCT 35.8* 28.6* 29.9*  MCV 96.5 95.0 95.2  PLT 128* 102* 101*    Cardiac Enzymes: No results for input(s): CKTOTAL, CKMB, CKMBINDEX, TROPONINI in the last 168 hours.  BNP: Invalid input(s): POCBNP  CBG: Recent Labs  Lab 09/06/18 1109 09/06/18 1110 09/06/18 2131 09/07/18 0650 09/07/18 1128  GLUCAP 83 86 94 75 97    Microbiology: Results for orders placed or performed during the hospital encounter of 09/05/18  Gastrointestinal Panel by PCR , Stool     Status: None   Collection Time: 09/06/18  6:47 PM  Result Value Ref Range Status   Campylobacter species NOT DETECTED NOT DETECTED Final   Plesimonas shigelloides NOT DETECTED NOT DETECTED Final   Salmonella species NOT DETECTED NOT DETECTED Final   Yersinia enterocolitica NOT DETECTED NOT DETECTED Final  Vibrio species NOT DETECTED NOT DETECTED Final   Vibrio cholerae NOT DETECTED NOT DETECTED Final   Enteroaggregative E coli (EAEC) NOT DETECTED NOT DETECTED Final   Enteropathogenic E coli (EPEC) NOT DETECTED NOT DETECTED Final   Enterotoxigenic E coli (ETEC) NOT DETECTED NOT DETECTED Final   Shiga like toxin producing E coli (STEC) NOT DETECTED NOT DETECTED Final   Shigella/Enteroinvasive E coli (EIEC) NOT DETECTED NOT DETECTED Final   Cryptosporidium NOT DETECTED NOT DETECTED Final   Cyclospora cayetanensis NOT  DETECTED NOT DETECTED Final   Entamoeba histolytica NOT DETECTED NOT DETECTED Final   Giardia lamblia NOT DETECTED NOT DETECTED Final   Adenovirus F40/41 NOT DETECTED NOT DETECTED Final   Astrovirus NOT DETECTED NOT DETECTED Final   Norovirus GI/GII NOT DETECTED NOT DETECTED Final   Rotavirus A NOT DETECTED NOT DETECTED Final   Sapovirus (I, II, IV, and V) NOT DETECTED NOT DETECTED Final    Comment: Performed at [facility], [address] [place], [place] [address]    Coagulation Studies: No results for input(s): LABPROT, INR in the last 72 hours.  Urinalysis: No results for input(s): COLORURINE, LABSPEC, PHURINE, GLUCOSEU, HGBUR, BILIRUBINUR, KETONESUR, PROTEINUR, UROBILINOGEN, NITRITE, LEUKOCYTESUR in the last 72 hours.  Invalid input(s): APPERANCEUR    Imaging: No results found.   Medications:    . amLODipine  10 mg Oral Daily  . calcitRIOL  0.25 mcg Oral Q M,W,F  . calcium acetate  1,334 mg Oral TID WC  . carvedilol  25 mg Oral BID WC  . Chlorhexidine Gluconate Cloth  6 each Topical Q0600  . Chlorhexidine Gluconate Cloth  6 each Topical Q0600  . Chlorhexidine Gluconate Cloth  6 each Topical Q0600  . Chlorhexidine Gluconate Cloth  6 each Topical Q0600  . escitalopram  10 mg Oral Daily  . heparin  5,000 Units Subcutaneous Q8H  . isosorbide-hydrALAZINE  1 tablet Oral TID  . lanthanum  1,000 mg Oral TID WC  . levETIRAcetam  500 mg Oral BID  . mometasone-formoterol  2 puff Inhalation BID  . multivitamin  1 tablet Oral QHS  . nitroGLYCERIN  1 inch Topical Q6H  . pravastatin  20 mg Oral QPM   acetaminophen **OR** acetaminophen, albuterol, hydrALAZINE, ondansetron **OR** ondansetron (ZOFRAN) IV, traZODone  Assessment/ Plan:   ESRD-Monday Wednesday Friday dialysis [facility] frequent admissions to emergency room for volume overload.  Patient again refused dialysis 09/09/2018.  Patient tolerated dialysis 09/10/2018 next dialysis treatment will be  09/13/2018  ANEMIA-hemoglobin less than 10 we will check iron stores  MBD-stable appears the patient is refusing his binders  HTN/VOL-Next dialysis dialysis schedule 13 September 2018 patient continues on isosorbide/hydralazine 1 tablet 3 times daily.  Amlodipine 10 mg daily and Coreg 25 mg twice daily  ACCESS-IJ catheter  Psychiatry patient suicidal needing inpatient psychiatric admission.  Lexapro 10 mg daily and trazodone 50 mg nightly.  Hyperlipidemia continues on pravastatin 20 mg daily  Seizure disorder  continue 500 mg Keppra daily  LOS: [place] @TODAY @9 :33 AM

## 2018-09-12 NOTE — Progress Notes (Signed)
PROGRESS NOTE    Patient: George [name]                            PCP: Shelby Mattocks, PA-C                    DOB: [birth year]            DOA: 09/05/2018 [id number]             DOS: 09/12/2018, 11:55 AM   LOS: 5 days   Date of Service: The patient was seen and examined on 09/12/2018  Subjective:   Patient seen alongside patient's nurse.  No new complaints.  Psychiatry input is appreciated.awaiting inpatient psychiatric placement as per psychiatry note.  Continue hemodialysis for now. Brief Narrative:   Pt. with PMH of ESRD on hemodialysis missed his dialysis last 2 sessions on Wednesday and Friday was discharged home on August 30, 2018 have to be admitted for respiratory failure secondary to fluid overload presents to the ER with complaints of shortness of breath and headache. Patient states over the last 2 days patient has been having shortness of breath increased on exertion Currently further plan is identify safe discharge location and inpatient psychiatric facility.   Patient is medically cleared to be discharged from the hospital.  Agreeable for hemodialysis  09/12/2018: Psychiatric input appreciated.  Inpatient psychiatry assessment and management as per the psychiatrist note.  Principal Problem:   MDD (major depressive disorder), single episode, severe , no psychosis ([place]) Active Problems:   ESRD (end stage renal disease) on dialysis ([place])   Hemiparesis affecting left side as late effect of stroke ([place])   Hypertensive urgency   Acute respiratory failure with hypoxia ([place])    Assessment & Plan:   Acute respiratory failure with hypoxia -  likely from fluid overload secondary to missing dialysis-nephrology has been consulted for dialysis. Closely follow respiratory status. Currently on room air, respiratory distress has resolved  Hypertension uncontrolled - improve with dialysis. Continue home medications including amlodipine BiDil and Coreg. PRN IV hydralazine  for now. 09/12/2018: Blood pressure is optimized.  Headache resolved. Likely from uncontrolled hypertension. CT head unremarkable.  History of stroke on statins.  History of seizure on Keppra.  Anemia likely from ESRD.Follow CBC.  History of depression on Lexapro.  Suicidal ideation.  Patient did exhibit suicidal ideation.  Psychiatry was consulted.  Recommended inpatient psychiatric admission.  Mild thrombocytopenia follow CBC.  Bilateral expiratory wheezing. Likely secondary to volume overload.  No evidence of active infection for now. Continue albuterol inhaler.  Add Dulera  Diet: RENALD IET DVT Prophylaxis: subcutaneous Heparin  Advance goals of care discussion: full code  Family Communication: no family was present at bedside, at the time of interview.  Disposition:  Discharge to [facility].... Pending bed availability at [facility] psych unit  Consultants: Nephrology, psychiatry Procedures: HD   Procedures:     Hemo-dialysis  Antimicrobials:  Anti-infectives (From admission, onward)   Start     Dose/Rate Route Frequency Ordered Stop   09/06/18 1000  clindamycin (CLEOCIN) capsule 300 mg     300 mg Oral 3 times daily 09/06/18 0509 09/06/18 2120       Medication:  . amLODipine  10 mg Oral Daily  . calcitRIOL  0.25 mcg Oral Q M,W,F  . calcium acetate  1,334 mg Oral TID WC  . carvedilol  25 mg Oral BID WC  . Chlorhexidine Gluconate Cloth  6 each Topical  [id number]  . escitalopram  10 mg Oral Daily  . heparin  5,000 Units Subcutaneous Q8H  . isosorbide-hydrALAZINE  1 tablet Oral TID  . lanthanum  1,000 mg Oral TID WC  . levETIRAcetam  500 mg Oral BID  . mometasone-formoterol  2 puff Inhalation BID  . multivitamin  1 tablet Oral QHS  . nitroGLYCERIN  1 inch Topical Q6H  . pravastatin  20 mg Oral QPM    acetaminophen **OR** acetaminophen, albuterol, hydrALAZINE, ondansetron **OR** ondansetron (ZOFRAN) IV, traZODone     Objective:   Vitals:   09/11/18  2208 09/12/18 0612 09/12/18 0824 09/12/18 0910  BP: 133/88 126/80 (!) 155/93   Pulse: 60 (!) 56 64   Resp:   18   Temp: 98.5 F (36.9 C) 98 F (36.7 C) 98.1 F (36.7 C)   TempSrc: Oral Oral Oral   SpO2: 95% 96% 96% 92%  Weight:      Height:        Intake/Output Summary (Last 24 hours) at 09/12/2018 1155 Last data filed at 09/12/2018 0920 Gross per 24 hour  Intake 960 ml  Output 0 ml  Net 960 ml   Filed Weights   09/06/18 1715 09/09/18 2150 09/10/18 0814  Weight: 92.5 kg 95.1 kg 94.6 kg     Examination:    General exam: Appears calm and comfortable  BP (!) 155/93 (BP Location: Right Arm)   Pulse 64   Temp 98.1 F (36.7 C) (Oral)   Resp 18   Ht 5\' 9"  (1.753 m)   Wt 94.6 kg   SpO2 92%   BMI 30.80 kg/m    Physical Exam  Constitution:  Alert, cooperative, no distress,  Psychiatric: Normal and stable mood and affect, cognition intact,   HEENT: Normocephalic, PERRL, otherwise with in Normal limits  Chest:Chest symmetric Cardio vascular:  S1/S2, RRR, No murmure, No Rubs or Gallops  pulmonary: Clear to auscultation bilaterally, respirations unlabored, negative wheezes / crackles Abdomen: Soft, non-tender, non-distended, bowel sounds,no masses, no organomegaly Muscular skeletal: Limited exam - in bed, able to move all 4 extremities, Normal strength,  Neuro: CNII-XII intact. , normal motor and sensation, reflexes intact  Extremities: No pitting edema lower extremities, +2 pulses  Skin: Dry, warm to touch, negative for any Rashes, No open wounds Wounds: per nursing documentation  LABs:  CBC Latest Ref Rng & Units 09/10/2018 09/07/2018 09/06/2018  WBC 4.0 - 10.5 K/uL 4.0 4.9 6.1  Hemoglobin 13.0 - 17.0 g/dL 9.7(L) 9.8(L) 11.9(L)  Hematocrit 39.0 - 52.0 % 29.9(L) 28.6(L) 35.8(L)  Platelets 150 - 400 K/uL 101(L) 102(L) 128(L)   CMP Latest Ref Rng & Units 09/10/2018 09/07/2018 09/06/2018  Glucose 70 - 99 mg/dL 131(H) 77 111(H)  BUN 6 - 20 mg/dL 56(H) 20 50(H)  Creatinine 0.61  - 1.24 mg/dL 14.33(H) 7.61(H) 13.17(H)  Sodium 135 - 145 mmol/L 137 138 143  Potassium 3.5 - 5.1 mmol/L 4.6 4.4 4.1  Chloride 98 - 111 mmol/L 101 103 105  CO2 22 - 32 mmol/L 23 26 24   Calcium 8.9 - 10.3 mg/dL 8.2(L) 7.8(L) 8.5(L)  Total Protein 6.5 - 8.1 g/dL - - -  Total Bilirubin 0.3 - 1.2 mg/dL - - -  Alkaline Phos 38 - 126 U/L - - -  AST 15 - 41 U/L - - -  ALT 17 - 63 U/L - - -

## 2018-09-13 DIAGNOSIS — F322 Major depressive disorder, single episode, severe without psychotic features: Secondary | ICD-10-CM | POA: Diagnosis not present

## 2018-09-13 DIAGNOSIS — D631 Anemia in chronic kidney disease: Secondary | ICD-10-CM | POA: Diagnosis not present

## 2018-09-13 DIAGNOSIS — R69 Illness, unspecified: Secondary | ICD-10-CM | POA: Diagnosis not present

## 2018-09-13 DIAGNOSIS — N2581 Secondary hyperparathyroidism of renal origin: Secondary | ICD-10-CM | POA: Diagnosis not present

## 2018-09-13 DIAGNOSIS — I120 Hypertensive chronic kidney disease with stage 5 chronic kidney disease or end stage renal disease: Secondary | ICD-10-CM | POA: Diagnosis not present

## 2018-09-13 DIAGNOSIS — Z992 Dependence on renal dialysis: Secondary | ICD-10-CM | POA: Diagnosis not present

## 2018-09-13 DIAGNOSIS — N186 End stage renal disease: Secondary | ICD-10-CM | POA: Diagnosis not present

## 2018-09-13 LAB — IRON AND TIBC
Iron: 105 ug/dL (ref 45–182)
SATURATION RATIOS: 38 % (ref 17.9–39.5)
TIBC: 277 ug/dL (ref 250–450)
UIBC: 172 ug/dL

## 2018-09-13 LAB — CBC
HCT: 28.6 % — ABNORMAL LOW (ref 39.0–52.0)
Hemoglobin: 9.6 g/dL — ABNORMAL LOW (ref 13.0–17.0)
MCH: 31.5 pg (ref 26.0–34.0)
MCHC: 33.6 g/dL (ref 30.0–36.0)
MCV: 93.8 fL (ref 80.0–100.0)
Platelets: 100 10*3/uL — ABNORMAL LOW (ref 150–400)
RBC: 3.05 MIL/uL — ABNORMAL LOW (ref 4.22–5.81)
RDW: 14.4 % (ref 11.5–15.5)
WBC: 3.8 10*3/uL — ABNORMAL LOW (ref 4.0–10.5)
nRBC: 0 % (ref 0.0–0.2)

## 2018-09-13 LAB — RENAL FUNCTION PANEL
Albumin: 3 g/dL — ABNORMAL LOW (ref 3.5–5.0)
Anion gap: 15 (ref 5–15)
BUN: 63 mg/dL — ABNORMAL HIGH (ref 6–20)
CHLORIDE: 97 mmol/L — AB (ref 98–111)
CO2: 21 mmol/L — AB (ref 22–32)
Calcium: 7.7 mg/dL — ABNORMAL LOW (ref 8.9–10.3)
Creatinine, Ser: 13.85 mg/dL — ABNORMAL HIGH (ref 0.61–1.24)
GFR calc Af Amer: 4 mL/min — ABNORMAL LOW (ref >60)
GFR calc non Af Amer: 4 mL/min — ABNORMAL LOW (ref >60)
Glucose, Bld: 71 mg/dL (ref 70–99)
Phosphorus: 5.7 mg/dL — ABNORMAL HIGH (ref 2.5–4.6)
Potassium: 5 mmol/L (ref 3.5–5.1)
Sodium: 133 mmol/L — ABNORMAL LOW (ref 135–145)

## 2018-09-13 MED ORDER — PENTAFLUOROPROP-TETRAFLUOROETH EX AERO: 1.0000 "application " | INHALATION_SPRAY | CUTANEOUS | Status: DC | PRN

## 2018-09-13 MED ORDER — HEPARIN SODIUM (PORCINE) 1000 UNIT/ML DIALYSIS: 1000.0000 [IU] | INTRAMUSCULAR | Status: DC | PRN

## 2018-09-13 MED ORDER — LIDOCAINE-PRILOCAINE 2.5-2.5 % EX CREA: 1.0000 "application " | TOPICAL_CREAM | CUTANEOUS | Status: DC | PRN

## 2018-09-13 MED ORDER — SODIUM CHLORIDE 0.9 % IV SOLN: 100.0000 mL | INTRAVENOUS | Status: DC | PRN

## 2018-09-13 MED ORDER — LIDOCAINE HCL (PF) 1 % IJ SOLN: 5.0000 mL | INTRAMUSCULAR | Status: DC | PRN

## 2018-09-13 MED ORDER — ALTEPLASE 2 MG IJ SOLR: 2.0000 mg | Freq: Once | INTRAMUSCULAR | Status: DC | PRN

## 2018-09-13 MED ORDER — ALTEPLASE 2 MG IJ SOLR
2.0000 mg | Freq: Once | INTRAMUSCULAR | Status: DC | PRN
  Filled 2018-09-13: qty 2

## 2018-09-13 MED ORDER — HEPARIN SODIUM (PORCINE) 1000 UNIT/ML DIALYSIS: 20.0000 [IU]/kg | INTRAMUSCULAR | Status: DC | PRN

## 2018-09-13 MED ORDER — IPRATROPIUM-ALBUTEROL 0.5-2.5 (3) MG/3ML IN SOLN
3.0000 mL | Freq: Two times a day (BID) | RESPIRATORY_TRACT | Status: DC
  Administered 2018-09-13 (×2): 3 mL via RESPIRATORY_TRACT
  Filled 2018-09-13 (×6): qty 3

## 2018-09-13 NOTE — Procedures (Signed)
Patient was seen on dialysis and the procedure was supervised.  BFR 400  Via AVF BP is  91/54.   Patient appears to be tolerating treatment well- I decreased goal   George [name] 09/13/2018

## 2018-09-13 NOTE — Progress Notes (Signed)
Subjective:  Seen on HD- initially refused but then consented Objective Vital signs in last 24 hours: Vitals:   09/12/18 1626 09/12/18 2211 09/13/18 0604 09/13/18 0749  BP: 132/86 124/70 140/86   Pulse: 64 67 62   Resp: 19 18 18    Temp: (!) 97.5 F (36.4 C) 98.2 F (36.8 C) 98.1 F (36.7 C)   TempSrc: Oral Oral Oral   SpO2: 95% 97% 97% 94%  Weight:      Height:       Weight change:   Intake/Output Summary (Last 24 hours) at 09/13/2018 1351 Last data filed at 09/13/2018 1310 Gross per 24 hour  Intake 1200 ml  Output 0 ml  Net 1200 ml    Assessment/ Plan: Pt is a 48 y.o. yo male with ESRD and psych issues who was admitted on 09/05/2018 with missed HD- have resumed HD and working on plan for discharge   Assessment/Plan: 1. Renal- ESRD- missing treatments thought due to social concerns- psych saw , diagnosed with MDD- felt to need inpatient psych- having some issues placing him 2. ESRD - normally MWF [facility]- on treatment today via AVF  3. Anemia- hgb in the high 9's - no iron or ESA- will add ESA  4. Secondary hyperparathyroidism- on rocaltrol /phoslo and fosrenol - last phos 5.5- cont for now 5. HTN/volume- well controlled on norvasc 10, coreg 25 and bidil- I guess continue for now   [facility]    Labs: Basic Metabolic Panel: Recent Labs  Lab 09/07/18 0546 09/10/18 0944  NA 138 137  K 4.4 4.6  CL 103 101  CO2 26 23  GLUCOSE 77 131*  BUN 20 56*  CREATININE 7.61* 14.33*  CALCIUM 7.8* 8.2*  PHOS  --  5.5*   Liver Function Tests: Recent Labs  Lab 09/10/18 0944  ALBUMIN 3.1*   No results for input(s): LIPASE, AMYLASE in the last 168 hours. No results for input(s): AMMONIA in the last 168 hours. CBC: Recent Labs  Lab 09/07/18 0546 09/10/18 0943  WBC 4.9 4.0  HGB 9.8* 9.7*  HCT 28.6* 29.9*  MCV 95.0 95.2  PLT 102* 101*   Cardiac Enzymes: No results for input(s): CKTOTAL, CKMB, CKMBINDEX, TROPONINI in the last 168 hours. CBG: Recent  Labs  Lab 09/06/18 2131 09/07/18 0650 09/07/18 1128  GLUCAP 94 75 97    Iron Studies:  Recent Labs    09/13/18 0712  IRON 105  TIBC 277   Studies/Results: No results found. Medications: Infusions: . sodium chloride    . sodium chloride      Scheduled Medications: . amLODipine  10 mg Oral Daily  . budesonide (PULMICORT) nebulizer solution  0.25 mg Nebulization BID  . calcitRIOL  0.25 mcg Oral Q M,W,F  . calcium acetate  1,334 mg Oral TID WC  . carvedilol  25 mg Oral BID WC  . Chlorhexidine Gluconate Cloth  6 each Topical Q0600  . escitalopram  10 mg Oral Daily  . heparin  5,000 Units Subcutaneous Q8H  . ipratropium-albuterol  3 mL Nebulization BID  . isosorbide-hydrALAZINE  1 tablet Oral TID  . lanthanum  1,000 mg Oral TID WC  . levETIRAcetam  500 mg Oral BID  . multivitamin  1 tablet Oral QHS  . nitroGLYCERIN  1 inch Topical Q6H  . pravastatin  20 mg Oral QPM    have reviewed scheduled and prn medications.  Physical Exam: General: NAD Heart: RRR Lungs: mostly clear Abdomen: soft, non tender Extremities: heavy  legs but really little edema Dialysis Access: left upper arm AVF    09/13/2018,1:51 PM  LOS: 6 days

## 2018-09-13 NOTE — Progress Notes (Addendum)
Transport arrived to unit to take patient to dialysis.  Patient refused and stated he didn't feel like going today.  RN discussed with patient the serious risks of not going to dialysis.  Patient still refusing.  MD paged   MD spoke with patient via phone.  Patient now agreeable to hemodialysis

## 2018-09-13 NOTE — Clinical Social Work Note (Signed)
Patient's IVC paperwork (update) completed today, faxed to magistrate, served by a Education officer, museum and paperwork placed in patient's chart. Patient's [facility]) referral application and requested clinicals re-faxed to hospital today and received. Talked with Gwyndolyn Saxon with [facility]  regarding referral and his questions answered. CSW also faxed nephrology note as Gwyndolyn Saxon requested information regarding patient's current dialysis schedule. CSW advised that once they can take patient CSW will be advised ahead of time as hospital will have to set-up patient's dialysis for his stay at [facility].  Jimmye Norman reported that they have used [facility] and [facility] in [place] advised that we will go with a [employer] this can be handled by [employer] staff at the hospital. [facility] address is [address] [place], [place]  [address].  CSW will continue to follow, contact [place] to ensue patient remains on the wait list and talk with hospital [facility] staff regarding setting up HD at a Center near [facility].  George [name], MSW, LCSW Licensed Clinical Social Worker [place] [phone]

## 2018-09-13 NOTE — Progress Notes (Signed)
PROGRESS NOTE    Patient: George [name]                            PCP: Shelby Mattocks, PA-C                    DOB: Oct 06, 1970            DOA: 09/05/2018 [id number]             DOS: 09/13/2018, 8:35 AM   LOS: 6 days   Date of Service: The patient was seen and examined on 09/13/2018  Subjective:   Patient seen.  Patient was initially refusing hemodialysis.  Patient is under involuntary commitment for suicidal ideations/behavior.  Need to comply with medical management communicated to the patient.    Brief Narrative:   Patient is a 48 year old African-American male, obese, with past medical history significant for ESRD on hemodialysis.  Patient was admitted with respiratory failure secondary to fluid overload.  Patient presented to the ER with complaints complaints of shortness of breath and headache.  Patient symptoms improved with hemodialysis.  Patient may not be very compliant with hemodialysis.  Prior to discharge, patient was noted to have some material around his neck.  Due to concerns for possible suicidal behavior, psychiatric team was consulted.  Psychiatric team has advised inpatient psychiatric assessment.  Social worker/case management is looking for ideal psychiatric facility for patient.  Meanwhile, patient remains medically stable for discharge (as previously documented).   09/13/2018: No new complaints.  Awaiting inpatient psychiatry assessment and management as per the psychiatrist note.  Principal Problem:   MDD (major depressive disorder), single episode, severe , no psychosis ([place]) Active Problems:   ESRD (end stage renal disease) on dialysis ([place])   Hemiparesis affecting left side as late effect of stroke ([place])   Hypertensive urgency   Acute respiratory failure with hypoxia ([facility])    Assessment & Plan:   Acute respiratory failure with hypoxia: -Resolved significantly. -Likely secondary to pulmonary edema following noncompliance with hemodialysis.  -Continue hemodialysis Monday, Wednesday and Friday.  Uncontrolled hypertension:  -Improved with hemodialysis.   -Continue current medication.   -Blood pressure is optimized.    Headache: Resolved.  CT head unremarkable.  History of seizure: On Keppra.  Anemia: Likely CKD.   Continue to monitor H/H.    History of depression/suicidal ideation: On Lexapro.   Suicidal ideation.   Psychiatric team is managing.   Plan is for inpatient psychiatric assessment and management.   Patient is medically stable to be transferred to a psychiatric facility.    Mild thrombocytopenia: Continue to monitor closely.   Bilateral expiratory wheezing: Likely secondary to volume overload.  Diet: RENALD IET DVT Prophylaxis: subcutaneous Heparin Advance goals of care discussion: full code Family Communication:    Disposition:  Discharge to [facility].... Pending bed availability at [facility] psych unit  Consultants: Nephrology, psychiatry Procedures: HD   Procedures:     Hemo-dialysis  Antimicrobials:  Anti-infectives (From admission, onward)   Start     Dose/Rate Route Frequency Ordered Stop   09/06/18 1000  clindamycin (CLEOCIN) capsule 300 mg     300 mg Oral 3 times daily 09/06/18 0509 09/06/18 2120       Medication:  . amLODipine  10 mg Oral Daily  . budesonide (PULMICORT) nebulizer solution  0.25 mg Nebulization BID  . calcitRIOL  0.25 mcg Oral Q M,W,F  . calcium acetate  1,334 mg Oral TID WC  .  carvedilol  25 mg Oral BID WC  . Chlorhexidine Gluconate Cloth  6 each Topical Q0600  . escitalopram  10 mg Oral Daily  . heparin  5,000 Units Subcutaneous Q8H  . ipratropium-albuterol  3 mL Nebulization BID  . isosorbide-hydrALAZINE  1 tablet Oral TID  . lanthanum  1,000 mg Oral TID WC  . levETIRAcetam  500 mg Oral BID  . multivitamin  1 tablet Oral QHS  . nitroGLYCERIN  1 inch Topical Q6H  . pravastatin  20 mg Oral QPM    acetaminophen **OR** acetaminophen, albuterol,  hydrALAZINE, ondansetron **OR** ondansetron (ZOFRAN) IV, traZODone     Objective:   Vitals:   09/12/18 1626 09/12/18 2211 09/13/18 0604 09/13/18 0749  BP: 132/86 124/70 140/86   Pulse: 64 67 62   Resp: 19 18 18    Temp: (!) 97.5 F (36.4 C) 98.2 F (36.8 C) 98.1 F (36.7 C)   TempSrc: Oral Oral Oral   SpO2: 95% 97% 97% 94%  Weight:      Height:        Intake/Output Summary (Last 24 hours) at 09/13/2018 0835 Last data filed at 09/13/2018 0724 Gross per 24 hour  Intake 1080 ml  Output 0 ml  Net 1080 ml   Filed Weights   09/06/18 1715 09/09/18 2150 09/10/18 0814  Weight: 92.5 kg 95.1 kg 94.6 kg     Examination:    General exam: Appears calm and comfortable  BP 140/86 (BP Location: Right Arm)   Pulse 62   Temp 98.1 F (36.7 C) (Oral)   Resp 18   Ht 5\' 9"  (1.753 m)   Wt 94.6 kg   SpO2 94%   BMI 30.80 kg/m    Physical Exam  Constitution:  Alert, cooperative, no distress.  Obese    HEENT: Mild pallor.  No jaundice.   Cardio vascular:  S1/S2 pulmonary: Adequate air entry.   Abdomen: Soft, non-tender, obese.  Organs are difficult to assess.   Neuro: Awake and alert.  Patient moves all limbs.   Extremities: No leg edema  LABs:  CBC Latest Ref Rng & Units 09/10/2018 09/07/2018 09/06/2018  WBC 4.0 - 10.5 K/uL 4.0 4.9 6.1  Hemoglobin 13.0 - 17.0 g/dL 9.7(L) 9.8(L) 11.9(L)  Hematocrit 39.0 - 52.0 % 29.9(L) 28.6(L) 35.8(L)  Platelets 150 - 400 K/uL 101(L) 102(L) 128(L)   CMP Latest Ref Rng & Units 09/10/2018 09/07/2018 09/06/2018  Glucose 70 - 99 mg/dL 131(H) 77 111(H)  BUN 6 - 20 mg/dL 56(H) 20 50(H)  Creatinine 0.61 - 1.24 mg/dL 14.33(H) 7.61(H) 13.17(H)  Sodium 135 - 145 mmol/L 137 138 143  Potassium 3.5 - 5.1 mmol/L 4.6 4.4 4.1  Chloride 98 - 111 mmol/L 101 103 105  CO2 22 - 32 mmol/L 23 26 24   Calcium 8.9 - 10.3 mg/dL 8.2(L) 7.8(L) 8.5(L)  Total Protein 6.5 - 8.1 g/dL - - -  Total Bilirubin 0.3 - 1.2 mg/dL - - -  Alkaline Phos 38 - 126 U/L - - -  AST 15 - 41  U/L - - -  ALT 17 - 63 U/L - - -

## 2018-09-14 DIAGNOSIS — I120 Hypertensive chronic kidney disease with stage 5 chronic kidney disease or end stage renal disease: Secondary | ICD-10-CM | POA: Diagnosis not present

## 2018-09-14 DIAGNOSIS — N2581 Secondary hyperparathyroidism of renal origin: Secondary | ICD-10-CM | POA: Diagnosis not present

## 2018-09-14 DIAGNOSIS — J9601 Acute respiratory failure with hypoxia: Secondary | ICD-10-CM | POA: Diagnosis not present

## 2018-09-14 DIAGNOSIS — F322 Major depressive disorder, single episode, severe without psychotic features: Secondary | ICD-10-CM | POA: Diagnosis not present

## 2018-09-14 DIAGNOSIS — I69354 Hemiplegia and hemiparesis following cerebral infarction affecting left non-dominant side: Secondary | ICD-10-CM | POA: Diagnosis not present

## 2018-09-14 DIAGNOSIS — R69 Illness, unspecified: Secondary | ICD-10-CM | POA: Diagnosis not present

## 2018-09-14 DIAGNOSIS — Z992 Dependence on renal dialysis: Secondary | ICD-10-CM | POA: Diagnosis not present

## 2018-09-14 DIAGNOSIS — N186 End stage renal disease: Secondary | ICD-10-CM | POA: Diagnosis not present

## 2018-09-14 DIAGNOSIS — D631 Anemia in chronic kidney disease: Secondary | ICD-10-CM | POA: Diagnosis not present

## 2018-09-14 MED ORDER — DARBEPOETIN ALFA 60 MCG/0.3ML IJ SOSY
60.0000 ug | PREFILLED_SYRINGE | INTRAMUSCULAR | Status: DC
  Administered 2018-09-15: 60 ug via INTRAVENOUS
  Filled 2018-09-14: qty 0.3

## 2018-09-14 MED ORDER — CHLORHEXIDINE GLUCONATE CLOTH 2 % EX PADS: 6.0000 | MEDICATED_PAD | Freq: Every day | CUTANEOUS | Status: DC

## 2018-09-14 NOTE — Progress Notes (Signed)
Patient declined evening B/P medications and binders. Dorthey Sawyer, RN

## 2018-09-14 NOTE — Progress Notes (Signed)
[facility]                                                                              Patient Demographics  George [name], is a 48 y.o. male, DOB - February 13, 1971, ENI:[id number]  Admit date - 09/05/2018   Admitting Physician Rise Patience, MD  Outpatient Primary MD for the patient is [name], [name], [place]  Outpatient specialists:   LOS - 7  days   Medical records reviewed and are as summarized below:    Chief Complaint  Patient presents with  . Shortness of Breath       Brief summary   Patient is a 48 year old African-American male, obese, with past medical history significant for ESRD on hemodialysis.  Patient was admitted with respiratory failure secondary to fluid overload.  Patient presented to the ER with complaints complaints of shortness of breath and headache.  Patient symptoms improved with hemodialysis.  Patient may not be very compliant with hemodialysis.  Prior to discharge, patient was noted to have some material around his neck.  Due to concerns for possible suicidal behavior, psychiatric team was consulted.  Psychiatric team has advised inpatient psychiatric assessment.  Social worker/case management is looking for ideal psychiatric facility for patient.  Meanwhile, patient remains medically stable for discharge (as previously documented).    Assessment & Plan   Acute respiratory failure with hypoxia secondary to pulmonary edema -Resolved after starting on hemodialysis.   - Continue HD MWF, renal following -O2 sats 99% on room air, wheezing resolved   Hypertension: Uncontrolled -Improving with HD, continue current medications  Headache CT unremarkable, improved   History of seizure: On Keppra.  Anemia of chronic disease due to ESRD H&H stable, 9.6  History of depression with suicidal ideation: On Lexapro.  Psychiatry was consulted, recommended inpatient psychiatric admission   Mild thrombocytopenia Stable,  follow counts  Code Status: full DVT Prophylaxis:   SCD's Family Communication: Discussed in detail with the patient, all imaging results, lab results explained to the patient    Disposition Plan: Pending inpatient psych facility availability  Time Spent in minutes     Procedures:  Hemodialysis  Consultants:   Nephrology Psychiatry  Antimicrobials:   Anti-infectives (From admission, onward)   Start     Dose/Rate Route Frequency Ordered Stop   09/06/18 1000  clindamycin (CLEOCIN) capsule 300 mg     300 mg Oral 3 times daily 09/06/18 0509 09/06/18 2120          Medications  Scheduled Meds: . amLODipine  10 mg Oral Daily  . budesonide (PULMICORT) nebulizer solution  0.25 mg Nebulization BID  . calcitRIOL  0.25 mcg Oral Q M,W,F  . calcium acetate  1,334 mg Oral TID WC  . carvedilol  25 mg Oral BID WC  . Chlorhexidine Gluconate Cloth  6 each Topical Q0600  . [START ON 09/15/2018] Chlorhexidine Gluconate Cloth  6 each Topical Q0600  . [START ON 09/15/2018] darbepoetin (ARANESP) injection - DIALYSIS  60 mcg Intravenous Q Wed-HD  . escitalopram  10 mg Oral Daily  . heparin  5,000 Units Subcutaneous Q8H  . ipratropium-albuterol  3 mL Nebulization BID  .  isosorbide-hydrALAZINE  1 tablet Oral TID  . lanthanum  1,000 mg Oral TID WC  . levETIRAcetam  500 mg Oral BID  . multivitamin  1 tablet Oral QHS  . nitroGLYCERIN  1 inch Topical Q6H  . pravastatin  20 mg Oral QPM   Continuous Infusions: PRN Meds:.acetaminophen **OR** acetaminophen, albuterol, hydrALAZINE, ondansetron **OR** ondansetron (ZOFRAN) IV, traZODone      Subjective:   George [name] was seen and examined today.  No complaints.  Overnight no acute issues.  No fevers. Patient denies dizziness, chest pain, shortness of breath, abdominal pain, N/V/D.   Objective:   Vitals:   09/13/18 2121 09/13/18 2130 09/14/18 0500 09/14/18 0611  BP: (!) 152/83   (!) 147/91  Pulse: 75   64  Resp: 18   18  Temp: 98.3 F  (36.8 C)   98.2 F (36.8 C)  TempSrc: Oral   Oral  SpO2: 96%   99%  Weight:  93.9 kg 93.9 kg   Height:        Intake/Output Summary (Last 24 hours) at 09/14/2018 1358 Last data filed at 09/14/2018 0845 Gross per 24 hour  Intake 220 ml  Output 2120 ml  Net -1900 ml     Wt Readings from Last 3 Encounters:  09/14/18 93.9 kg  08/30/18 96.3 kg  08/11/18 96.5 kg     Exam  General: Alert and oriented x 3, NAD  Eyes:   HEENT:  Atraumatic, normocephalic  Cardiovascular: S1 S2 auscultated, Regular rate and rhythm.  Respiratory: Clear to auscultation bilaterally, no wheezing, rales or rhonchi  Gastrointestinal: Soft, nontender, nondistended, + bowel sounds  Ext: no pedal edema bilaterally  Neuro: Moving all 4 extremities  Musculoskeletal: No digital cyanosis, clubbing  Skin: No rashes  Psych: Calm and cooperative   Data Reviewed:  I have personally reviewed following labs and imaging studies  Micro Results Recent Results (from the past 240 hour(s))  Gastrointestinal Panel by PCR , Stool     Status: None   Collection Time: 09/06/18  6:47 PM  Result Value Ref Range Status   Campylobacter species NOT DETECTED NOT DETECTED Final   Plesimonas shigelloides NOT DETECTED NOT DETECTED Final   Salmonella species NOT DETECTED NOT DETECTED Final   Yersinia enterocolitica NOT DETECTED NOT DETECTED Final   Vibrio species NOT DETECTED NOT DETECTED Final   Vibrio cholerae NOT DETECTED NOT DETECTED Final   Enteroaggregative E coli (EAEC) NOT DETECTED NOT DETECTED Final   Enteropathogenic E coli (EPEC) NOT DETECTED NOT DETECTED Final   Enterotoxigenic E coli (ETEC) NOT DETECTED NOT DETECTED Final   Shiga like toxin producing E coli (STEC) NOT DETECTED NOT DETECTED Final   Shigella/Enteroinvasive E coli (EIEC) NOT DETECTED NOT DETECTED Final   Cryptosporidium NOT DETECTED NOT DETECTED Final   Cyclospora cayetanensis NOT DETECTED NOT DETECTED Final   Entamoeba histolytica NOT  DETECTED NOT DETECTED Final   Giardia lamblia NOT DETECTED NOT DETECTED Final   Adenovirus F40/41 NOT DETECTED NOT DETECTED Final   Astrovirus NOT DETECTED NOT DETECTED Final   Norovirus GI/GII NOT DETECTED NOT DETECTED Final   Rotavirus A NOT DETECTED NOT DETECTED Final   Sapovirus (I, II, IV, and V) NOT DETECTED NOT DETECTED Final    Comment: Performed at [facility], [address], [place], [place] [address]    Radiology Reports Dg Orthopantogram  Result Date: 08/27/2018 CLINICAL DATA:  Left-sided jaw pain EXAM: ORTHOPANTOGRAM/PANORAMIC COMPARISON:  None. FINDINGS: Panoramic view of the mandible demonstrates no acute  fracture. Dental caries are noted involving the left second maxillary molar as well as the right third maxillary molar. No periapical abscess is noted. No other focal abnormality is seen. IMPRESSION: Dental caries as described.  No acute abnormality is noted. Electronically Signed   By: Inez Catalina M.D.   On: 08/27/2018 16:56   Dg Chest 2 View  Result Date: 09/06/2018 CLINICAL DATA:  Dyspnea x1 hour EXAM: CHEST - 2 VIEW COMPARISON:  08/16/2018 FINDINGS: Cardiomegaly with aortic atherosclerosis. Diffuse interstitial prominence consistent with interstitial edema with stable small right pleural effusion is again noted, the degree of interstitial edema has decreased since prior. No new pulmonary consolidation. No pneumothorax. No acute osseous abnormality. IMPRESSION: Cardiomegaly with mild interstitial edema and stable small right pleural effusion. Electronically Signed   By: Ashley Royalty M.D.   On: 09/06/2018 00:38   Dg Chest 2 View  Result Date: 08/16/2018 CLINICAL DATA:  Shortness of breath. EXAM: CHEST - 2 VIEW COMPARISON:  Radiographs of August 10, 2018. FINDINGS: Stable cardiomegaly. Mild central pulmonary vascular congestion is noted. Atherosclerosis of thoracic aorta is noted. No pneumothorax is noted. Probable mild bibasilar pulmonary edema is noted. Mild right  pleural effusion is noted. Bony thorax is unremarkable. IMPRESSION: Stable cardiomegaly with central pulmonary vascular congestion and probable mild bibasilar pulmonary edema. Mild right pleural effusion. Aortic Atherosclerosis (ICD10-I70.0). Electronically Signed   By: Marijo Conception, M.D.   On: 08/16/2018 13:18   Ct Head Wo Contrast  Result Date: 09/06/2018 CLINICAL DATA:  Dyspnea for 1 hour.  Headache. EXAM: CT HEAD WITHOUT CONTRAST TECHNIQUE: Contiguous axial images were obtained from the base of the skull through the vertex without intravenous contrast. COMPARISON:  06/20/2015 head CT and MRI FINDINGS: Brain: Encephalomalacia involving the right frontoparietal lobe from remote infarct as well as remote circumscribed cystic focus possibly representing cystic encephalomalacia in the left occipital lobe are noted. No new focus of hemorrhage, infarction, midline shift or edema. Midline fourth ventricle basal cisterns without effacement. No hydrocephalus. Vascular: No hyperdense vessel sign. Skull: Intact Sinuses/Orbits: Nonacute Other: None IMPRESSION: 1. Chronic microvascular ischemia and stigmata of remote right frontoparietal lobe infarct with cystic encephalomalacia also noted in the left occipital lobe. 2. No acute intracranial abnormality. Electronically Signed   By: Ashley Royalty M.D.   On: 09/06/2018 00:44    Lab Data:  CBC: Recent Labs  Lab 09/10/18 0943 09/13/18 1529  WBC 4.0 3.8*  HGB 9.7* 9.6*  HCT 29.9* 28.6*  MCV 95.2 93.8  PLT 101* 696*   Basic Metabolic Panel: Recent Labs  Lab 09/10/18 0944 09/13/18 1529  NA 137 133*  K 4.6 5.0  CL 101 97*  CO2 23 21*  GLUCOSE 131* 71  BUN 56* 63*  CREATININE 14.33* 13.85*  CALCIUM 8.2* 7.7*  PHOS 5.5* 5.7*   GFR: Estimated Creatinine Clearance: 7.5 mL/min (A) (by C-G formula based on SCr of 13.85 mg/dL (H)). Liver Function Tests: Recent Labs  Lab 09/10/18 0944 09/13/18 1529  ALBUMIN 3.1* 3.0*   No results for input(s):  LIPASE, AMYLASE in the last 168 hours. No results for input(s): AMMONIA in the last 168 hours. Coagulation Profile: No results for input(s): INR, PROTIME in the last 168 hours. Cardiac Enzymes: No results for input(s): CKTOTAL, CKMB, CKMBINDEX, TROPONINI in the last 168 hours. BNP (last 3 results) No results for input(s): PROBNP in the last 8760 hours. HbA1C: No results for input(s): HGBA1C in the last 72 hours. CBG: No results for input(s): GLUCAP in the  last 168 hours. Lipid Profile: No results for input(s): CHOL, HDL, LDLCALC, TRIG, CHOLHDL, LDLDIRECT in the last 72 hours. Thyroid Function Tests: No results for input(s): TSH, T4TOTAL, FREET4, T3FREE, THYROIDAB in the last 72 hours. Anemia Panel: Recent Labs    09/13/18 0712  TIBC 277  IRON 105   Urine analysis: No results found for: COLORURINE, APPEARANCEUR, LABSPEC, PHURINE, GLUCOSEU, HGBUR, BILIRUBINUR, KETONESUR, PROTEINUR, UROBILINOGEN, NITRITE, LEUKOCYTESUR   Marv Alfrey M.D. [facility] 09/14/2018, 1:58 PM  Pager: [phone] Between 7am to 7pm - call Pager - [phone]  After 7pm go to [email or web address] - password [email or web address]  Call night coverage person covering after 7pm

## 2018-09-14 NOTE — Progress Notes (Signed)
Subjective:  HD yest- removed 2000 tolerated well  Objective Vital signs in last 24 hours: Vitals:   09/13/18 2121 09/13/18 2130 09/14/18 0500 09/14/18 0611  BP: (!) 152/83   (!) 147/91  Pulse: 75   64  Resp: 18   18  Temp: 98.3 F (36.8 C)   98.2 F (36.8 C)  TempSrc: Oral   Oral  SpO2: 96%   99%  Weight:  93.9 kg 93.9 kg   Height:       Weight change:   Intake/Output Summary (Last 24 hours) at 09/14/2018 1245 Last data filed at 09/14/2018 0845 Gross per 24 hour  Intake 460 ml  Output 2120 ml  Net -1660 ml    Assessment/ Plan: Pt is a 48 y.o. yo male with ESRD and psych issues who was admitted on 09/05/2018 with missed HD- have resumed HD and working on plan for discharge   Assessment/Plan: 1. Renal- ESRD- missing treatments thought due to social concerns- psych saw , diagnosed with MDD- felt to need inpatient psych- having some issues placing him, still pending, now [facility] to [facility]   2. ESRD - normally MWF [facility]-  treatment ordered for tomorrow via AVF  3. Anemia- hgb in the high 9's - no iron or ESA- have added  ESA  4. Secondary hyperparathyroidism- on rocaltrol /phoslo and fosrenol - last phos 5.7- cont for now 5. HTN/volume- well controlled on norvasc 10, coreg 25 and bidil-  continue for now   [facility]    Labs: Basic Metabolic Panel: Recent Labs  Lab 09/10/18 0944 09/13/18 1529  NA 137 133*  K 4.6 5.0  CL 101 97*  CO2 23 21*  GLUCOSE 131* 71  BUN 56* 63*  CREATININE 14.33* 13.85*  CALCIUM 8.2* 7.7*  PHOS 5.5* 5.7*   Liver Function Tests: Recent Labs  Lab 09/10/18 0944 09/13/18 1529  ALBUMIN 3.1* 3.0*   No results for input(s): LIPASE, AMYLASE in the last 168 hours. No results for input(s): AMMONIA in the last 168 hours. CBC: Recent Labs  Lab 09/10/18 0943 09/13/18 1529  WBC 4.0 3.8*  HGB 9.7* 9.6*  HCT 29.9* 28.6*  MCV 95.2 93.8  PLT 101* 100*   Cardiac Enzymes: No results for input(s):  CKTOTAL, CKMB, CKMBINDEX, TROPONINI in the last 168 hours. CBG: No results for input(s): GLUCAP in the last 168 hours.  Iron Studies:  Recent Labs    09/13/18 0712  IRON 105  TIBC 277   Studies/Results: No results found. Medications: Infusions:   Scheduled Medications: . amLODipine  10 mg Oral Daily  . budesonide (PULMICORT) nebulizer solution  0.25 mg Nebulization BID  . calcitRIOL  0.25 mcg Oral Q M,W,F  . calcium acetate  1,334 mg Oral TID WC  . carvedilol  25 mg Oral BID WC  . Chlorhexidine Gluconate Cloth  6 each Topical Q0600  . escitalopram  10 mg Oral Daily  . heparin  5,000 Units Subcutaneous Q8H  . ipratropium-albuterol  3 mL Nebulization BID  . isosorbide-hydrALAZINE  1 tablet Oral TID  . lanthanum  1,000 mg Oral TID WC  . levETIRAcetam  500 mg Oral BID  . multivitamin  1 tablet Oral QHS  . nitroGLYCERIN  1 inch Topical Q6H  . pravastatin  20 mg Oral QPM    have reviewed scheduled and prn medications.  Physical Exam: General: NAD- not very interactive  Heart: RRR Lungs: mostly clear Abdomen: soft, non tender Extremities: heavy legs but really  little edema Dialysis Access: left upper arm AVF    09/14/2018,12:45 PM  LOS: 7 days

## 2018-09-15 DIAGNOSIS — N2581 Secondary hyperparathyroidism of renal origin: Secondary | ICD-10-CM | POA: Diagnosis not present

## 2018-09-15 DIAGNOSIS — I120 Hypertensive chronic kidney disease with stage 5 chronic kidney disease or end stage renal disease: Secondary | ICD-10-CM | POA: Diagnosis not present

## 2018-09-15 DIAGNOSIS — N186 End stage renal disease: Secondary | ICD-10-CM | POA: Diagnosis not present

## 2018-09-15 DIAGNOSIS — Z992 Dependence on renal dialysis: Secondary | ICD-10-CM | POA: Diagnosis not present

## 2018-09-15 DIAGNOSIS — D631 Anemia in chronic kidney disease: Secondary | ICD-10-CM | POA: Diagnosis not present

## 2018-09-15 DIAGNOSIS — R69 Illness, unspecified: Secondary | ICD-10-CM | POA: Diagnosis not present

## 2018-09-15 DIAGNOSIS — F322 Major depressive disorder, single episode, severe without psychotic features: Secondary | ICD-10-CM | POA: Diagnosis not present

## 2018-09-15 DIAGNOSIS — F321 Major depressive disorder, single episode, moderate: Secondary | ICD-10-CM | POA: Diagnosis not present

## 2018-09-15 LAB — RENAL FUNCTION PANEL
Albumin: 3.1 g/dL — ABNORMAL LOW (ref 3.5–5.0)
Anion gap: 12 (ref 5–15)
BUN: 41 mg/dL — ABNORMAL HIGH (ref 6–20)
CO2: 25 mmol/L (ref 22–32)
Calcium: 8.4 mg/dL — ABNORMAL LOW (ref 8.9–10.3)
Chloride: 100 mmol/L (ref 98–111)
Creatinine, Ser: 10.81 mg/dL — ABNORMAL HIGH (ref 0.61–1.24)
GFR calc Af Amer: 6 mL/min — ABNORMAL LOW (ref >60)
GFR calc non Af Amer: 5 mL/min — ABNORMAL LOW (ref >60)
Glucose, Bld: 72 mg/dL (ref 70–99)
POTASSIUM: 4.2 mmol/L (ref 3.5–5.1)
Phosphorus: 4.5 mg/dL (ref 2.5–4.6)
Sodium: 137 mmol/L (ref 135–145)

## 2018-09-15 LAB — CBC
HCT: 29.1 % — ABNORMAL LOW (ref 39.0–52.0)
Hemoglobin: 9.7 g/dL — ABNORMAL LOW (ref 13.0–17.0)
MCH: 31.7 pg (ref 26.0–34.0)
MCHC: 33.3 g/dL (ref 30.0–36.0)
MCV: 95.1 fL (ref 80.0–100.0)
NRBC: 0 % (ref 0.0–0.2)
Platelets: 100 10*3/uL — ABNORMAL LOW (ref 150–400)
RBC: 3.06 MIL/uL — ABNORMAL LOW (ref 4.22–5.81)
RDW: 14.9 % (ref 11.5–15.5)
WBC: 3.9 10*3/uL — ABNORMAL LOW (ref 4.0–10.5)

## 2018-09-15 MED ORDER — DARBEPOETIN ALFA 60 MCG/0.3ML IJ SOSY
PREFILLED_SYRINGE | INTRAMUSCULAR | Status: AC
  Administered 2018-09-15: 60 ug via INTRAVENOUS
  Filled 2018-09-15: qty 0.3

## 2018-09-15 MED ORDER — HEPARIN SODIUM (PORCINE) 1000 UNIT/ML DIALYSIS: 20.0000 [IU]/kg | INTRAMUSCULAR | Status: DC | PRN

## 2018-09-15 NOTE — Progress Notes (Signed)
[facility]                                                                              Patient Demographics  George [name], is a 48 y.o. male, DOB - 26-Feb-1971, TKP:[id number]  Admit date - 09/05/2018   Admitting Physician Rise Patience, MD  Outpatient Primary MD for the patient is [name], [name], [place]  Outpatient specialists:   LOS - 8  days   Medical records reviewed and are as summarized below:    Chief Complaint  Patient presents with  . Shortness of Breath       Brief summary   Patient is a 48 year old African-American male, obese, with past medical history significant for ESRD on hemodialysis.  Patient was admitted with respiratory failure secondary to fluid overload.  Patient presented to the ER with complaints complaints of shortness of breath and headache.  Patient symptoms improved with hemodialysis.  Patient may not be very compliant with hemodialysis.  Prior to discharge, patient was noted to have some material around his neck.  Due to concerns for possible suicidal behavior, psychiatric team was consulted.  Psychiatric team has advised inpatient psychiatric assessment.  Social worker/case management is looking for ideal psychiatric facility for patient.  Meanwhile, patient remains medically stable for discharge (as previously documented).   09/15/2018: We will reconsult psychiatric team.  Patient is a denying suicidal ideations.  Will defer further management to the psychiatric team.   Assessment & Plan   Acute respiratory failure with hypoxia secondary to pulmonary edema -Resolved after starting on hemodialysis.   - Continue HD MWF, renal following -O2 sats 99% on room air, wheezing resolved   Hypertension:  Continue to optimize.   Improving with HD Continue current medications  Headache CT unremarkable, improved  History of seizure: On Keppra.  Anemia of chronic disease due to ESRD H&H stable Hemoglobin today is  9.7 g/dL.    History of depression with suicidal ideation: Patient currently denies suicidal ideations.   We will reconsult psychiatry.    Mild thrombocytopenia Stable, follow counts  Code Status: full DVT Prophylaxis:   SCD's Family Communication: Discussed in detail with the patient, all imaging results, lab results explained to the patient    Disposition Plan: This will depend on psychiatry reevaluation.  Time Spent in minutes     Procedures:  Hemodialysis  Consultants:   Nephrology Psychiatry  Antimicrobials:   Anti-infectives (From admission, onward)   Start     Dose/Rate Route Frequency Ordered Stop   09/06/18 1000  clindamycin (CLEOCIN) capsule 300 mg     300 mg Oral 3 times daily 09/06/18 0509 09/06/18 2120      Medications  Scheduled Meds: . amLODipine  10 mg Oral Daily  . budesonide (PULMICORT) nebulizer solution  0.25 mg Nebulization BID  . calcitRIOL  0.25 mcg Oral Q M,W,F  . calcium acetate  1,334 mg Oral TID WC  . carvedilol  25 mg Oral BID WC  . Chlorhexidine Gluconate Cloth  6 each Topical Q0600  . Chlorhexidine Gluconate Cloth  6 each Topical Q0600  . darbepoetin (ARANESP) injection - DIALYSIS  60 mcg Intravenous  Q Wed-HD  . escitalopram  10 mg Oral Daily  . heparin  5,000 Units Subcutaneous Q8H  . ipratropium-albuterol  3 mL Nebulization BID  . isosorbide-hydrALAZINE  1 tablet Oral TID  . lanthanum  1,000 mg Oral TID WC  . levETIRAcetam  500 mg Oral BID  . multivitamin  1 tablet Oral QHS  . nitroGLYCERIN  1 inch Topical Q6H  . pravastatin  20 mg Oral QPM   Continuous Infusions: PRN Meds:.acetaminophen **OR** acetaminophen, albuterol, heparin, hydrALAZINE, ondansetron **OR** ondansetron (ZOFRAN) IV, traZODone      Subjective:   George [name] was seen and examined today.  No complaints.  Overnight no acute issues.  No fevers. Patient denies dizziness, chest pain, shortness of breath, abdominal pain, N/V/D.   Objective:   Vitals:    09/15/18 1245 09/15/18 1315 09/15/18 1345 09/15/18 1415  BP: (!) 145/88 (!) 152/93 (!) 157/90 (!) 164/91  Pulse: 63 67 66 67  Resp: 17 16    Temp:      TempSrc:      SpO2:      Weight:      Height:        Intake/Output Summary (Last 24 hours) at 09/15/2018 1451 Last data filed at 09/15/2018 0943 Gross per 24 hour  Intake 240 ml  Output -  Net 240 ml     Wt Readings from Last 3 Encounters:  09/15/18 94 kg  08/30/18 96.3 kg  08/11/18 96.5 kg     Exam  General: Alert and oriented x 3, NAD.  Patient is obese.  HEENT: Pallor.  No jaundice.    Cardiovascular: S1 S2 auscultated  Respiratory: Clear to auscultation   Gastrointestinal: Soft, nontender, + bowel sounds  Ext: no pedal edema bilaterally  Neuro: Moving all 4 extremities.  Patient is awake and alert.  Psych: Calm and cooperative.  Patient denies suicidal ideations.  Data Reviewed:  I have personally reviewed following labs and imaging studies  Micro Results Recent Results (from the past 240 hour(s))  Gastrointestinal Panel by PCR , Stool     Status: None   Collection Time: 09/06/18  6:47 PM  Result Value Ref Range Status   Campylobacter species NOT DETECTED NOT DETECTED Final   Plesimonas shigelloides NOT DETECTED NOT DETECTED Final   Salmonella species NOT DETECTED NOT DETECTED Final   Yersinia enterocolitica NOT DETECTED NOT DETECTED Final   Vibrio species NOT DETECTED NOT DETECTED Final   Vibrio cholerae NOT DETECTED NOT DETECTED Final   Enteroaggregative E coli (EAEC) NOT DETECTED NOT DETECTED Final   Enteropathogenic E coli (EPEC) NOT DETECTED NOT DETECTED Final   Enterotoxigenic E coli (ETEC) NOT DETECTED NOT DETECTED Final   Shiga like toxin producing E coli (STEC) NOT DETECTED NOT DETECTED Final   Shigella/Enteroinvasive E coli (EIEC) NOT DETECTED NOT DETECTED Final   Cryptosporidium NOT DETECTED NOT DETECTED Final   Cyclospora cayetanensis NOT DETECTED NOT DETECTED Final   Entamoeba histolytica  NOT DETECTED NOT DETECTED Final   Giardia lamblia NOT DETECTED NOT DETECTED Final   Adenovirus F40/41 NOT DETECTED NOT DETECTED Final   Astrovirus NOT DETECTED NOT DETECTED Final   Norovirus GI/GII NOT DETECTED NOT DETECTED Final   Rotavirus A NOT DETECTED NOT DETECTED Final   Sapovirus (I, II, IV, and V) NOT DETECTED NOT DETECTED Final    Comment: Performed at [facility], [address], [place], [place] [address]    Radiology Reports Dg Orthopantogram  Result Date: 08/27/2018 CLINICAL DATA:  Left-sided  jaw pain EXAM: ORTHOPANTOGRAM/PANORAMIC COMPARISON:  None. FINDINGS: Panoramic view of the mandible demonstrates no acute fracture. Dental caries are noted involving the left second maxillary molar as well as the right third maxillary molar. No periapical abscess is noted. No other focal abnormality is seen. IMPRESSION: Dental caries as described.  No acute abnormality is noted. Electronically Signed   By: Inez Catalina M.D.   On: 08/27/2018 16:56   Dg Chest 2 View  Result Date: 09/06/2018 CLINICAL DATA:  Dyspnea x1 hour EXAM: CHEST - 2 VIEW COMPARISON:  08/16/2018 FINDINGS: Cardiomegaly with aortic atherosclerosis. Diffuse interstitial prominence consistent with interstitial edema with stable small right pleural effusion is again noted, the degree of interstitial edema has decreased since prior. No new pulmonary consolidation. No pneumothorax. No acute osseous abnormality. IMPRESSION: Cardiomegaly with mild interstitial edema and stable small right pleural effusion. Electronically Signed   By: Ashley Royalty M.D.   On: 09/06/2018 00:38   Ct Head Wo Contrast  Result Date: 09/06/2018 CLINICAL DATA:  Dyspnea for 1 hour.  Headache. EXAM: CT HEAD WITHOUT CONTRAST TECHNIQUE: Contiguous axial images were obtained from the base of the skull through the vertex without intravenous contrast. COMPARISON:  06/20/2015 head CT and MRI FINDINGS: Brain: Encephalomalacia involving the right frontoparietal  lobe from remote infarct as well as remote circumscribed cystic focus possibly representing cystic encephalomalacia in the left occipital lobe are noted. No new focus of hemorrhage, infarction, midline shift or edema. Midline fourth ventricle basal cisterns without effacement. No hydrocephalus. Vascular: No hyperdense vessel sign. Skull: Intact Sinuses/Orbits: Nonacute Other: None IMPRESSION: 1. Chronic microvascular ischemia and stigmata of remote right frontoparietal lobe infarct with cystic encephalomalacia also noted in the left occipital lobe. 2. No acute intracranial abnormality. Electronically Signed   By: Ashley Royalty M.D.   On: 09/06/2018 00:44    Lab Data:  CBC: Recent Labs  Lab 09/10/18 0943 09/13/18 1529 09/15/18 1253  WBC 4.0 3.8* 3.9*  HGB 9.7* 9.6* 9.7*  HCT 29.9* 28.6* 29.1*  MCV 95.2 93.8 95.1  PLT 101* 100* 177*   Basic Metabolic Panel: Recent Labs  Lab 09/10/18 0944 09/13/18 1529 09/15/18 1253  NA 137 133* 137  K 4.6 5.0 4.2  CL 101 97* 100  CO2 23 21* 25  GLUCOSE 131* 71 72  BUN 56* 63* 41*  CREATININE 14.33* 13.85* 10.81*  CALCIUM 8.2* 7.7* 8.4*  PHOS 5.5* 5.7* 4.5   GFR: Estimated Creatinine Clearance: 9.7 mL/min (A) (by C-G formula based on SCr of 10.81 mg/dL (H)). Liver Function Tests: Recent Labs  Lab 09/10/18 0944 09/13/18 1529 09/15/18 1253  ALBUMIN 3.1* 3.0* 3.1*   No results for input(s): LIPASE, AMYLASE in the last 168 hours. No results for input(s): AMMONIA in the last 168 hours. Coagulation Profile: No results for input(s): INR, PROTIME in the last 168 hours. Cardiac Enzymes: No results for input(s): CKTOTAL, CKMB, CKMBINDEX, TROPONINI in the last 168 hours. BNP (last 3 results) No results for input(s): PROBNP in the last 8760 hours. HbA1C: No results for input(s): HGBA1C in the last 72 hours. CBG: No results for input(s): GLUCAP in the last 168 hours. Lipid Profile: No results for input(s): CHOL, HDL, LDLCALC, TRIG, CHOLHDL,  LDLDIRECT in the last 72 hours. Thyroid Function Tests: No results for input(s): TSH, T4TOTAL, FREET4, T3FREE, THYROIDAB in the last 72 hours. Anemia Panel: Recent Labs    09/13/18 0712  TIBC 277  IRON 105   Urine analysis: No results found for: COLORURINE, APPEARANCEUR, [place], [place],  GLUCOSEU, HGBUR, BILIRUBINUR, KETONESUR, PROTEINUR, UROBILINOGEN, NITRITE, Ocie Bob M.D. [facility] 09/15/2018, 2:51 PM  Pager: [phone]  After 7pm go to [email or web address] - password [email or web address]  Call night coverage person covering after 7pm

## 2018-09-15 NOTE — Clinical Social Work Note (Addendum)
CSW working on setting up dialysis in [place], [place] so that when [facility] has a bed for patient, the dialysis center will be in place. [facility] Placement form completed and requested clinicals faxed. CSW called [facility] and left message to confirm fax received. Per Gwyndolyn Saxon with [facility], they will transport patient to dialysis in [place], MSW, [place] Licensed Clinical Social Worker [place] [phone]

## 2018-09-15 NOTE — Procedures (Signed)
Patient was seen on dialysis and the procedure was supervised.  BFR 400  Via AVF BP is  150/90.   Patient appears to be tolerating treatment well  Louis Meckel 09/15/2018

## 2018-09-15 NOTE — Progress Notes (Signed)
Subjective:  Due for HD later today - no c/o's  Objective Vital signs in last 24 hours: Vitals:   09/14/18 0611 09/14/18 1743 09/15/18 0603 09/15/18 1025  BP: (!) 147/91 126/76 (!) 146/90 (!) 152/91  Pulse: 64 64 67 66  Resp: 18 18 20 18   Temp: 98.2 F (36.8 C) 98.8 F (37.1 C) 98.3 F (36.8 C) 97.9 F (36.6 C)  TempSrc: Oral Oral Oral Oral  SpO2: 99% 98% 93% 96%  Weight:      Height:       Weight change:   Intake/Output Summary (Last 24 hours) at 09/15/2018 1111 Last data filed at 09/15/2018 1324 Gross per 24 hour  Intake 240 ml  Output -  Net 240 ml    Assessment/ Plan: Pt is a 48 y.o. yo male with ESRD and psych issues who was admitted on 09/05/2018 with missed HD- have resumed HD and working on plan for discharge   Assessment/Plan: 1. Renal- ESRD- missing treatments thought due to social concerns- psych saw , diagnosed with MDD- felt to need inpatient psych- having some issues placing him, still pending, now possibly to [facility]   2. ESRD - normally MWF [facility]-  treatment ordered for today via AVF  3. Anemia- hgb in the high 9's - iron stores OK- have added  ESA  4. Secondary hyperparathyroidism- on rocaltrol /phoslo and fosrenol - last phos 5.7 pre HD- cont for now 5. HTN/volume- well controlled on norvasc 10, coreg 25 and bidil-  continue for now   [facility]    Labs: Basic Metabolic Panel: Recent Labs  Lab 09/10/18 0944 09/13/18 1529  NA 137 133*  K 4.6 5.0  CL 101 97*  CO2 23 21*  GLUCOSE 131* 71  BUN 56* 63*  CREATININE 14.33* 13.85*  CALCIUM 8.2* 7.7*  PHOS 5.5* 5.7*   Liver Function Tests: Recent Labs  Lab 09/10/18 0944 09/13/18 1529  ALBUMIN 3.1* 3.0*   No results for input(s): LIPASE, AMYLASE in the last 168 hours. No results for input(s): AMMONIA in the last 168 hours. CBC: Recent Labs  Lab 09/10/18 0943 09/13/18 1529  WBC 4.0 3.8*  HGB 9.7* 9.6*  HCT 29.9* 28.6*  MCV 95.2 93.8  PLT 101* 100*    Cardiac Enzymes: No results for input(s): CKTOTAL, CKMB, CKMBINDEX, TROPONINI in the last 168 hours. CBG: No results for input(s): GLUCAP in the last 168 hours.  Iron Studies:  Recent Labs    09/13/18 0712  IRON 105  TIBC 277   Studies/Results: No results found. Medications: Infusions:   Scheduled Medications: . amLODipine  10 mg Oral Daily  . budesonide (PULMICORT) nebulizer solution  0.25 mg Nebulization BID  . calcitRIOL  0.25 mcg Oral Q M,W,F  . calcium acetate  1,334 mg Oral TID WC  . carvedilol  25 mg Oral BID WC  . Chlorhexidine Gluconate Cloth  6 each Topical Q0600  . Chlorhexidine Gluconate Cloth  6 each Topical Q0600  . darbepoetin (ARANESP) injection - DIALYSIS  60 mcg Intravenous Q Wed-HD  . escitalopram  10 mg Oral Daily  . heparin  5,000 Units Subcutaneous Q8H  . ipratropium-albuterol  3 mL Nebulization BID  . isosorbide-hydrALAZINE  1 tablet Oral TID  . lanthanum  1,000 mg Oral TID WC  . levETIRAcetam  500 mg Oral BID  . multivitamin  1 tablet Oral QHS  . nitroGLYCERIN  1 inch Topical Q6H  . pravastatin  20 mg Oral QPM  have reviewed scheduled and prn medications.  Physical Exam: General: NAD- not very interactive  Heart: RRR Lungs: mostly clear Abdomen: soft, non tender Extremities: heavy legs but really little edema Dialysis Access: left upper arm AVF    09/15/2018,11:11 AM  LOS: 8 days

## 2018-09-15 NOTE — Care Management Important Message (Signed)
Important Message  Patient Details  Name: George [name] MRN: [id number] Date of Birth: [birth year]   Medicare Important Message Given:  Yes    Ryker Pherigo 09/15/2018, 11:31 AM

## 2018-09-15 NOTE — Consult Note (Addendum)
[facility] Psych Consult Progress Note  09/15/2018 12:30 PM George [name]  MRN:  [id number] Subjective:   George [name] was last seen by the psychiatry consult service on 3/10 for suicidal gesture after placing his blanket around his neck prior to planned discharged. He was recommended for inpatient psychiatric hospitalization. Psychiatry was reconsulted since patient denies SI.  On interview, George [name] reports an improvement in his mood. He denies SI, HI or AVH. He denies SI for the past week. He is future oriented. He plans to return to [place] to be near his family. He reports that his family is supportive. He has several children. He denies problems with his appetite. He reports that his sleep is poor. His Trazodone is ordered for PRN use. He has not been requesting it. He denies any problems with Lexapro.   Principal Problem: MDD (major depressive disorder), single episode, moderate (HCC) Diagnosis:  Principal Problem:   MDD (major depressive disorder), single episode, severe , no psychosis ([place]) Active Problems:   ESRD (end stage renal disease) on dialysis ([place])   Hemiparesis affecting left side as late effect of stroke ([place])   Hypertensive urgency   Acute respiratory failure with hypoxia ([place])  Total Time spent with patient: 15 minutes  Past Psychiatric History: MDD  Past Medical History:  Past Medical History:  Diagnosis Date  . CHF (congestive heart failure) ([place])   . Diabetes mellitus without complication ([place])   . Hypertension   . Renal disorder   . Seizure [facility])     Past Surgical History:  Procedure Laterality Date  . AV FISTULA PLACEMENT Left 06/19/2015   Procedure: LEFT ARTERIOVENOUS (AV) FISTULA CREATION;  Surgeon: Elam Dutch, MD;  Location: [facility];  Service: Vascular;  Laterality: Left;  . CAPD REMOVAL N/A 06/22/2015   Procedure: CONTINUOUS AMBULATORY PERITONEAL DIALYSIS  (CAPD) CATHETER REMOVAL;  Surgeon: Ralene Ok, MD;  Location: [facility];  Service: General;  Laterality:  N/A;  . ESOPHAGOGASTRODUODENOSCOPY N/A 06/21/2015   Procedure: ESOPHAGOGASTRODUODENOSCOPY (EGD);  Surgeon: Laurence Spates, MD;  Location[facility];  Service: Endoscopy;  Laterality: N/A;  . INSERTION OF DIALYSIS CATHETER Left 06/19/2015   Procedure: INSERTION OF DIALYSIS CATHETER LEFT INTERNAL JUGULAR;  Surgeon: Elam Dutch, MD;  Location: [facility];  Service: Vascular;  Laterality: Left;  . peritoneal dialysis catheter placed     around 2015 in [place]   Family History:  Family History  Problem Relation Age of Onset  . Sudden Cardiac Death Neg Hx    Family Psychiatric  History: Denies  Social History:  Social History   Substance and Sexual Activity  Alcohol Use No     Social History   Substance and Sexual Activity  Drug Use Yes  . Types: Marijuana    Social History   Socioeconomic History  . Marital status: Single    Spouse name: Not on file  . Number of children: Not on file  . Years of education: Not on file  . Highest education level: Not on file  Occupational History  . Not on file  Social Needs  . Financial resource strain: Very hard  . Food insecurity:    Worry: Often true    Inability: Often true  . Transportation needs:    Medical: Yes    Non-medical: Yes  Tobacco Use  . Smoking status: Former Smoker    Types: Cigarettes  . Smokeless tobacco: Never Used  Substance and Sexual Activity  . Alcohol use: No  . Drug use: Yes  Types: Marijuana  . Sexual activity: Not on file  Lifestyle  . Physical activity:    Days per week: Not on file    Minutes per session: Not on file  . Stress: Not on file  Relationships  . Social connections:    Talks on phone: Not on file    Gets together: Not on file    Attends religious service: Not on file    Active member of club or organization: Not on file    Attends meetings of clubs or organizations: Not on file    Relationship status: Not on file  Other Topics Concern  . Not on file  Social History  Narrative  . Not on file    Sleep: Poor  Appetite:  Good  Current Medications: Current Facility-Administered Medications  Medication Dose Route Frequency Provider Last Rate Last Dose  . acetaminophen (TYLENOL) tablet 650 mg  650 mg Oral Q6H PRN Rise Patience, MD       Or  . acetaminophen (TYLENOL) suppository 650 mg  650 mg Rectal Q6H PRN Rise Patience, MD      . albuterol (PROVENTIL) (2.5 MG/3ML) 0.083% nebulizer solution 3 mL  3 mL Inhalation Q6H PRN Lavina Hamman, MD      . amLODipine (NORVASC) tablet 10 mg  10 mg Oral Daily Rise Patience, MD   Stopped at 09/15/18 1050  . budesonide (PULMICORT) nebulizer solution 0.25 mg  0.25 mg Nebulization BID Dana Allan I, MD   0.25 mg at 09/13/18 2120  . calcitRIOL (ROCALTROL) capsule 0.25 mcg  0.25 mcg Oral Q M,W,F Rise Patience, MD   0.25 mcg at 09/15/18 1054  . calcium acetate (PHOSLO) capsule 1,334 mg  1,334 mg Oral TID WC Rise Patience, MD   1,334 mg at 09/14/18 1146  . carvedilol (COREG) tablet 25 mg  25 mg Oral BID WC Rise Patience, MD   Stopped at 09/15/18 1051  . Chlorhexidine Gluconate Cloth 2 % PADS 6 each  6 each Topical Q0600 Edrick Oh, MD      . Chlorhexidine Gluconate Cloth 2 % PADS 6 each  6 each Topical Q0600 Corliss Parish, MD      . Darbepoetin Alfa (ARANESP) injection 60 mcg  60 mcg Intravenous Q Wed-HD Corliss Parish, MD      . escitalopram (LEXAPRO) tablet 10 mg  10 mg Oral Daily Rise Patience, MD   10 mg at 09/15/18 1054  . heparin injection 5,000 Units  5,000 Units Subcutaneous Q8H Rise Patience, MD      . hydrALAZINE (APRESOLINE) injection 5 mg  5 mg Intravenous Q4H PRN Rise Patience, MD   5 mg at 09/06/18 0610  . ipratropium-albuterol (DUONEB) 0.5-2.5 (3) MG/3ML nebulizer solution 3 mL  3 mL Nebulization BID Dana Allan I, MD   3 mL at 09/13/18 2120  . isosorbide-hydrALAZINE (BIDIL) 20-37.5 MG per tablet 1 tablet  1 tablet Oral TID  Rise Patience, MD   Stopped at 09/15/18 1052  . lanthanum (FOSRENOL) chewable tablet 1,000 mg  1,000 mg Oral TID WC Rise Patience, MD   1,000 mg at 09/12/18 1234  . levETIRAcetam (KEPPRA) tablet 500 mg  500 mg Oral BID Rise Patience, MD   500 mg at 09/15/18 1054  . multivitamin (RENA-VIT) tablet 1 tablet  1 tablet Oral QHS Rise Patience, MD   1 tablet at 09/14/18 2225  . nitroGLYCERIN (NITROGLYN) 2 % ointment 1 inch  1 inch Topical Q6H Rise Patience, MD   1 inch at 09/06/18 [id number]  . ondansetron (ZOFRAN) tablet 4 mg  4 mg Oral Q6H PRN Rise Patience, MD       Or  . ondansetron [facility]) injection 4 mg  4 mg Intravenous Q6H PRN Rise Patience, MD      . pravastatin (PRAVACHOL) tablet 20 mg  20 mg Oral QPM Rise Patience, MD   20 mg at 09/13/18 1901  . traZODone (DESYREL) tablet 50 mg  50 mg Oral QHS PRN Rise Patience, MD        Lab Results:  Results for orders placed or performed during the hospital encounter of 09/05/18 (from the past 48 hour(s))  CBC     Status: Abnormal   Collection Time: 09/13/18  3:29 PM  Result Value Ref Range   WBC 3.8 (L) 4.0 - 10.5 K/uL   RBC 3.05 (L) 4.22 - 5.81 MIL/uL   Hemoglobin 9.6 (L) 13.0 - 17.0 g/dL   HCT 28.6 (L) 39.0 - 52.0 %   MCV 93.8 80.0 - 100.0 fL   MCH 31.5 26.0 - 34.0 pg   MCHC 33.6 30.0 - 36.0 g/dL   RDW 14.4 11.5 - 15.5 %   Platelets 100 (L) 150 - 400 K/uL    Comment: REPEATED TO VERIFY Immature Platelet Fraction may be clinically indicated, consider ordering this additional test [id number] CONSISTENT WITH PREVIOUS RESULT    nRBC 0.0 0.0 - 0.2 %    Comment: Performed at [facility], [place] [address] [place], [place] [address]  Renal function panel     Status: Abnormal   Collection Time: 09/13/18  3:29 PM  Result Value Ref Range   Sodium 133 (L) 135 - 145 mmol/L   Potassium 5.0 3.5 - 5.1 mmol/L   Chloride 97 (L) 98 - 111 mmol/L   CO2 21 (L) 22 - 32 mmol/L   Glucose, Bld 71  70 - 99 mg/dL   BUN 63 (H) 6 - 20 mg/dL   Creatinine, Ser 13.85 (H) 0.61 - 1.24 mg/dL   Calcium 7.7 (L) 8.9 - 10.3 mg/dL   Phosphorus 5.7 (H) 2.5 - 4.6 mg/dL   Albumin 3.0 (L) 3.5 - 5.0 g/dL   GFR calc non Af Amer 4 (L) >60 mL/min   GFR calc Af Amer 4 (L) >60 mL/min   Anion gap 15 5 - 15    Comment: Performed at [facility] [address] [place], [place] [address]    Blood Alcohol level:  No results found for: [facility]   Musculoskeletal: Strength & Muscle Tone: within normal limits Gait & Station: UTA since patient is lying in bed and receiving dialysis. Patient leans: N/A  Psychiatric Specialty Exam: Physical Exam  Nursing note and vitals reviewed. Constitutional: He is oriented to person, place, and time. He appears well-developed and well-nourished.  HENT:  Head: Normocephalic and atraumatic.  Neck: Normal range of motion.  Respiratory: Effort normal.  Musculoskeletal: Normal range of motion.  Neurological: He is alert and oriented to person, place, and time.  Psychiatric: He has a normal mood and affect. His speech is normal and behavior is normal. Judgment and thought content normal. Cognition and memory are normal.    Review of Systems  Psychiatric/Behavioral: Negative for depression, hallucinations and suicidal ideas. The patient has insomnia.   All other systems reviewed and are negative.   Blood pressure (!) 152/91, pulse 66, temperature 97.9 F (  36.6 C), temperature source Oral, resp. rate 18, height 5\' 9"  (1.753 m), weight 93.9 kg, SpO2 96 %.Body mass index is 30.57 kg/m.  General Appearance: Fairly Groomed, middle aged, African American male, wearing a hospital gown and lying in bed while receiving dialysis. NAD.   Eye Contact:  Good  Speech:  Clear and Coherent and Normal Rate  Volume:  Normal  Mood:  Euthymic  Affect:  Appropriate and Congruent  Thought Process:  Goal Directed, Linear and Descriptions of Associations: Intact  Orientation:  Full (Time,  Place, and Person)  Thought Content:  Logical  Suicidal Thoughts:  No  Homicidal Thoughts:  No  Memory:  Immediate;   Good Recent;   Good Remote;   Good  Judgement:  Fair  Insight:  Fair  Psychomotor Activity:  Normal  Concentration:  Concentration: Good and Attention Span: Good  Recall:  Good  Fund of Knowledge:  Good  Language:  Good  Akathisia:  No  Handed:  Right  AIMS (if indicated):   N/A  Assets:  Communication Skills Desire for Improvement Financial Resources/Insurance Resilience Social Support  ADL's:  Intact  Cognition:  WNL  Sleep:   Poor   Assessment:  George [name] is a 48 y.o. male who was admitted with acute respiratory failure with hypoxia likely from fluid overload secondary to missing dialysis. He reports an improvement in his mood. He denies SI for the past week. He denies HI or AVH. He is future oriented. He plans to return to [place] to be closer to his family who are a good support for him. He no longer warrants inpatient psychiatric hospitalization.    Treatment Plan Summary: -Continue Lexapro 10 mg daily for depression.  -Schedule Trazodone since patient reports poor sleep and is not requesting medication.  -Patient is aware of outpatient mental health resources in [place] where he will be returning on discharge.  -Patient is psychiatrically cleared. Psychiatry will sign off on patient at this time. Please consult psychiatry again as needed.   Faythe Dingwall, DO 09/15/2018, 12:30 PM

## 2018-09-16 DIAGNOSIS — N2581 Secondary hyperparathyroidism of renal origin: Secondary | ICD-10-CM | POA: Diagnosis not present

## 2018-09-16 DIAGNOSIS — F321 Major depressive disorder, single episode, moderate: Secondary | ICD-10-CM

## 2018-09-16 DIAGNOSIS — N186 End stage renal disease: Secondary | ICD-10-CM | POA: Diagnosis not present

## 2018-09-16 DIAGNOSIS — Z992 Dependence on renal dialysis: Secondary | ICD-10-CM | POA: Diagnosis not present

## 2018-09-16 DIAGNOSIS — R69 Illness, unspecified: Secondary | ICD-10-CM | POA: Diagnosis not present

## 2018-09-16 DIAGNOSIS — D631 Anemia in chronic kidney disease: Secondary | ICD-10-CM | POA: Diagnosis not present

## 2018-09-16 DIAGNOSIS — I120 Hypertensive chronic kidney disease with stage 5 chronic kidney disease or end stage renal disease: Secondary | ICD-10-CM | POA: Diagnosis not present

## 2018-09-16 MED ORDER — TRAZODONE HCL 50 MG PO TABS: 50.0000 mg | ORAL_TABLET | Freq: Every day | ORAL | 0 refills | Status: AC

## 2018-09-16 MED ORDER — TRAZODONE HCL 50 MG PO TABS: 50.0000 mg | ORAL_TABLET | Freq: Every day | ORAL | Status: DC

## 2018-09-16 NOTE — Progress Notes (Signed)
Subjective:  HD yesterday removed 3 liters - I guess now going home as denies SI  Objective Vital signs in last 24 hours: Vitals:   09/15/18 1630 09/15/18 1638 09/15/18 2034 09/16/18 0552  BP: (!) 167/91 (!) 159/94 (!) 151/74 (!) 163/97  Pulse: 74 75 79 65  Resp:  18 20 20   Temp:  (!) 97.2 F (36.2 C) 98.4 F (36.9 C) 97.7 F (36.5 C)  TempSrc:  Oral Oral Oral  SpO2:  98% 95% 93%  Weight:  91.1 kg    Height:       Weight change:   Intake/Output Summary (Last 24 hours) at 09/16/2018 1038 Last data filed at 09/16/2018 0601 Gross per 24 hour  Intake 0 ml  Output 3000 ml  Net -3000 ml    Assessment/ Plan: Pt is a 48 y.o. yo male with ESRD and psych issues who was admitted on 09/05/2018 with missed HD- have resumed HD and working on plan for discharge   Assessment/Plan: 1. Renal- ESRD- missing treatments thought due to social concerns- psych saw , diagnosed with MDD- felt to need inpatient psych- having some issues placing him, now denying SI- plan is to discharge home  2. ESRD - normally MWF [facility]-  Will be due next to go to [facility].  He promises that he will go.  I encouraged him to speak with the staff at the kidney center if there are things he needs to get to his treatments   3. Anemia- hgb in the high 9's - iron stores OK- have added  ESA  4. Secondary hyperparathyroidism- on rocaltrol /phoslo and fosrenol - last phos 4.5 pre HD- cont for now 5. HTN/volume- well controlled on norvasc 10, coreg 25 and bidil-  continue for now   [facility]    Labs: Basic Metabolic Panel: Recent Labs  Lab 09/10/18 0944 09/13/18 1529 09/15/18 1253  NA 137 133* 137  K 4.6 5.0 4.2  CL 101 97* 100  CO2 23 21* 25  GLUCOSE 131* 71 72  BUN 56* 63* 41*  CREATININE 14.33* 13.85* 10.81*  CALCIUM 8.2* 7.7* 8.4*  PHOS 5.5* 5.7* 4.5   Liver Function Tests: Recent Labs  Lab 09/10/18 0944 09/13/18 1529 09/15/18 1253  ALBUMIN 3.1* 3.0* 3.1*   No  results for input(s): LIPASE, AMYLASE in the last 168 hours. No results for input(s): AMMONIA in the last 168 hours. CBC: Recent Labs  Lab 09/10/18 0943 09/13/18 1529 09/15/18 1253  WBC 4.0 3.8* 3.9*  HGB 9.7* 9.6* 9.7*  HCT 29.9* 28.6* 29.1*  MCV 95.2 93.8 95.1  PLT 101* 100* 100*   Cardiac Enzymes: No results for input(s): CKTOTAL, CKMB, CKMBINDEX, TROPONINI in the last 168 hours. CBG: No results for input(s): GLUCAP in the last 168 hours.  Iron Studies:  No results for input(s): IRON, TIBC, TRANSFERRIN, FERRITIN in the last 72 hours. Studies/Results: No results found. Medications: Infusions:   Scheduled Medications: . amLODipine  10 mg Oral Daily  . budesonide (PULMICORT) nebulizer solution  0.25 mg Nebulization BID  . calcitRIOL  0.25 mcg Oral Q M,W,F  . calcium acetate  1,334 mg Oral TID WC  . carvedilol  25 mg Oral BID WC  . Chlorhexidine Gluconate Cloth  6 each Topical Q0600  . Chlorhexidine Gluconate Cloth  6 each Topical Q0600  . darbepoetin (ARANESP) injection - DIALYSIS  60 mcg Intravenous Q Wed-HD  . escitalopram  10 mg Oral Daily  . heparin  5,000 Units  Subcutaneous Q8H  . ipratropium-albuterol  3 mL Nebulization BID  . isosorbide-hydrALAZINE  1 tablet Oral TID  . lanthanum  1,000 mg Oral TID WC  . levETIRAcetam  500 mg Oral BID  . multivitamin  1 tablet Oral QHS  . nitroGLYCERIN  1 inch Topical Q6H  . pravastatin  20 mg Oral QPM  . traZODone  50 mg Oral QHS    have reviewed scheduled and prn medications.  Physical Exam: General: NAD- not very interactive  Heart: RRR Lungs: mostly clear Abdomen: soft, non tender Extremities: heavy legs but really little edema Dialysis Access: left upper arm AVF    09/16/2018,10:38 AM  LOS: 9 days

## 2018-09-16 NOTE — Progress Notes (Signed)
George [name] to be D/C'd [place] per MD order.  Discussed prescriptions and follow up appointments with the patient. Prescriptions given to patient, medication list explained in detail. Pt verbalized understanding.  Allergies as of 09/16/2018      Reactions   Lisinopril Shortness Of Breath   Pt's family said he had SOB, Chest pressure , and coughing       Medication List    STOP taking these medications   benzocaine 10 % mucosal gel Commonly known as:  ORAJEL   clindamycin 300 MG capsule Commonly known as:  CLEOCIN   lidocaine-prilocaine cream Commonly known as:  EMLA     TAKE these medications   amLODipine 10 MG tablet Commonly known as:  NORVASC Take 1 tablet (10 mg total) by mouth daily.   calcitRIOL 0.25 MCG capsule Commonly known as:  ROCALTROL Take 1 capsule (0.25 mcg total) by mouth Every Tuesday,Thursday,and Saturday with dialysis. What changed:  when to take this   calcium acetate 667 MG capsule Commonly known as:  PHOSLO Take 1,334 mg by mouth 3 (three) times daily with meals.   carvedilol 25 MG tablet Commonly known as:  COREG Take 25 mg by mouth 2 (two) times daily with a meal.   escitalopram 10 MG tablet Commonly known as:  LEXAPRO Take 1 tablet (10 mg total) by mouth daily.   isosorbide-hydrALAZINE 20-37.5 MG tablet Commonly known as:  BIDIL Take 1 tablet by mouth 3 (three) times daily.   lanthanum 1000 MG chewable tablet Commonly known as:  FOSRENOL Chew 1 tablet (1,000 mg total) by mouth 3 (three) times daily with meals.   levETIRAcetam 500 MG tablet Commonly known as:  KEPPRA Take 500 mg by mouth 2 (two) times daily.   multivitamin Tabs tablet Take 1 tablet by mouth daily.   pravastatin 20 MG tablet Commonly known as:  PRAVACHOL Take 20 mg by mouth every evening.   traZODone 50 MG tablet Commonly known as:  DESYREL Take 1 tablet (50 mg total) by mouth at bedtime as needed for sleep. What changed:  Another medication with the same  name was added. Make sure you understand how and when to take each.   traZODone 50 MG tablet Commonly known as:  DESYREL Take 1 tablet (50 mg total) by mouth at bedtime. What changed:  You were already taking a medication with the same name, and this prescription was added. Make sure you understand how and when to take each.       Vitals:   09/15/18 2034 09/16/18 0552  BP: (!) 151/74 (!) 163/97  Pulse: 79 65  Resp: 20 20  Temp: 98.4 F (36.9 C) 97.7 F (36.5 C)  SpO2: 95% 93%    Skin clean, dry and intact without evidence of skin break down, no evidence of skin tears noted. IV catheter discontinued intact. Site without signs and symptoms of complications. Dressing and pressure applied. Pt denies pain at this time. No complaints noted.  An After Visit Summary was printed and given to the patient. Patient belonging returned to patient at bedside. Patient escorted via [place], and D/C home via cab public transport.

## 2018-09-16 NOTE — Clinical Social Work Note (Signed)
Patient cleared by psychiatry on 3/18 and [facility] contacted and informed and Change of Commitment paperwork faxed to the Magistrate's office. CSW contacted [employer] to determine if they could accept patient for their night shelter and they are currently not accepting any new people into the shelter. Clio in [employer] (Art gallery manager) contacted [phone]) and message left regarding patient. Patient discharged to [facility]) on [address] as they are remaining open until 7 pm. Patient provided with a cab voucher to get to the [facility]. Patient plans to return to [place] at some point. Per Mr. Arth he only has about $60 left of his check at this point. When asked, patient has not contacted his children and indicated that his phone is dead. Patient nurse assisted in getting his phone charged and patient encouraged to contact his children regarding assistance in getting back to [place]. CSW signing off as no other SW intervention services needed.  George [name], MSW, LCSW Licensed Clinical Social Worker [place] [phone]

## 2018-09-16 NOTE — Discharge Summary (Signed)
Physician Discharge Summary  George [name] [id number] DOB: 04/21/1971 DOA: 09/05/2018  PCP: Shelby Mattocks, PA-C  Admit date: 09/05/2018 Discharge date: 09/16/2018  Admitted From:  Disposition:   Recommendations for Outpatient Follow-up:  1. Follow up with PCP in 1-2 weeks 2. Please obtain BMP/CBC in one week 3. Please follow up with [place] dialysis and  nephrologist  Home Health: None  equipment/Devices: None Discharge Condition: Stable and improved CODE STATUS full code Diet recommendation cardiac Brief/Interim Summary:48 year old African-American male, obese, with past medical history significant forESRD on hemodialysis. Patient was admitted with respiratory failure secondary to fluid overload. Patient presented to the ER with complaints complaints of shortness of breath and headache.Patient symptoms improved with hemodialysis. Patient may not be very compliant with hemodialysis. Prior to discharge, patient was noted to have some material around his neck. Due to concerns for possible suicidal behavior, psychiatric team was consulted. Psychiatric team has advised inpatient psychiatric assessment. Social worker/case management is looking for ideal psychiatric facility for patient. Meanwhile, patient remains medically stable for discharge (as previously documented).   09/15/2018: We will reconsult psychiatric team.  Patient is a denying suicidal ideations.  Will defer further management to the psychiatric team.   Discharge Diagnoses:  Principal Problem:   MDD (major depressive disorder), single episode, moderate (HCC) Active Problems:   ESRD (end stage renal disease) on dialysis ([place])   Hemiparesis affecting left side as late effect of stroke ([place])   Hypertensive urgency   MDD (major depressive disorder), single episode, severe , no psychosis ([place])   Acute respiratory failure with hypoxia ([place])    Acute respiratory failure with hypoxia secondary to  pulmonary edema -Resolved after starting on hemodialysis.   - Continue HD MWF-O2 sats 99% on room air, wheezing resolved   Hypertension:    Improving with HD Continue current medications  Headache CT unremarkable, improved  History of seizure: OnKeppra.  Anemia of chronic disease due to ESRD H&H stable  History of depression with suicidal ideation: Patient currently denies suicidal ideations.    Psychiatry was reconsulted and has cleared him and deemed not suicidal at this time and recommended to continue Lexapro and trazodone.    Estimated body mass index is 29.66 kg/m as calculated from the following:   Height as of this encounter: 5\' 9"  (1.753 m).   Weight as of this encounter: 91.1 kg.  Discharge Instructions  Discharge Instructions    Call MD for:  difficulty breathing, headache or visual disturbances   Complete by:  As directed    Call MD for:  persistant nausea and vomiting   Complete by:  As directed    Call MD for:  severe uncontrolled pain   Complete by:  As directed    Call MD for:  temperature >100.4   Complete by:  As directed    Diet - low sodium heart healthy   Complete by:  As directed    Diet - low sodium heart healthy   Complete by:  As directed    Discharge instructions   Complete by:  As directed    Take medications as prescribed Attend dialysis as scheduled Use resources available for transportation   Increase activity slowly   Complete by:  As directed    Increase activity slowly   Complete by:  As directed      Allergies as of 09/16/2018      Reactions   Lisinopril Shortness Of Breath   Pt's family said he had SOB, Chest pressure , and coughing  Medication List    STOP taking these medications   benzocaine 10 % mucosal gel Commonly known as:  ORAJEL   clindamycin 300 MG capsule Commonly known as:  CLEOCIN   lidocaine-prilocaine cream Commonly known as:  EMLA     TAKE these medications   amLODipine 10 MG  tablet Commonly known as:  NORVASC Take 1 tablet (10 mg total) by mouth daily.   calcitRIOL 0.25 MCG capsule Commonly known as:  ROCALTROL Take 1 capsule (0.25 mcg total) by mouth Every Tuesday,Thursday,and Saturday with dialysis. What changed:  when to take this   calcium acetate 667 MG capsule Commonly known as:  PHOSLO Take 1,334 mg by mouth 3 (three) times daily with meals.   carvedilol 25 MG tablet Commonly known as:  COREG Take 25 mg by mouth 2 (two) times daily with a meal.   escitalopram 10 MG tablet Commonly known as:  LEXAPRO Take 1 tablet (10 mg total) by mouth daily.   isosorbide-hydrALAZINE 20-37.5 MG tablet Commonly known as:  BIDIL Take 1 tablet by mouth 3 (three) times daily.   lanthanum 1000 MG chewable tablet Commonly known as:  FOSRENOL Chew 1 tablet (1,000 mg total) by mouth 3 (three) times daily with meals.   levETIRAcetam 500 MG tablet Commonly known as:  KEPPRA Take 500 mg by mouth 2 (two) times daily.   multivitamin Tabs tablet Take 1 tablet by mouth daily.   pravastatin 20 MG tablet Commonly known as:  PRAVACHOL Take 20 mg by mouth every evening.   traZODone 50 MG tablet Commonly known as:  DESYREL Take 1 tablet (50 mg total) by mouth at bedtime as needed for sleep. What changed:  Another medication with the same name was added. Make sure you understand how and when to take each.   traZODone 50 MG tablet Commonly known as:  DESYREL Take 1 tablet (50 mg total) by mouth at bedtime. What changed:  You were already taking a medication with the same name, and this prescription was added. Make sure you understand how and when to take each.      Follow-up Information    [name], [name], PA-C Follow up.   Specialties:  Internal Medicine, Emergency Medicine Contact information: [address] [place] [address] [phone]          Allergies  Allergen Reactions  . Lisinopril Shortness Of Breath    Pt's  family said he had SOB, Chest pressure , and coughing     Consultations: Nephrology and psychiatry  Procedures/Studies: Dg Orthopantogram  Result Date: 08/27/2018 CLINICAL DATA:  Left-sided jaw pain EXAM: ORTHOPANTOGRAM/PANORAMIC COMPARISON:  None. FINDINGS: Panoramic view of the mandible demonstrates no acute fracture. Dental caries are noted involving the left second maxillary molar as well as the right third maxillary molar. No periapical abscess is noted. No other focal abnormality is seen. IMPRESSION: Dental caries as described.  No acute abnormality is noted. Electronically Signed   By: Inez Catalina M.D.   On: 08/27/2018 16:56   Dg Chest 2 View  Result Date: 09/06/2018 CLINICAL DATA:  Dyspnea x1 hour EXAM: CHEST - 2 VIEW COMPARISON:  08/16/2018 FINDINGS: Cardiomegaly with aortic atherosclerosis. Diffuse interstitial prominence consistent with interstitial edema with stable small right pleural effusion is again noted, the degree of interstitial edema has decreased since prior. No new pulmonary consolidation. No pneumothorax. No acute osseous abnormality. IMPRESSION: Cardiomegaly with mild interstitial edema and stable small right pleural effusion. Electronically Signed   By: Meredith Leeds.D.  On: 09/06/2018 00:38   Ct Head Wo Contrast  Result Date: 09/06/2018 CLINICAL DATA:  Dyspnea for 1 hour.  Headache. EXAM: CT HEAD WITHOUT CONTRAST TECHNIQUE: Contiguous axial images were obtained from the base of the skull through the vertex without intravenous contrast. COMPARISON:  06/20/2015 head CT and MRI FINDINGS: Brain: Encephalomalacia involving the right frontoparietal lobe from remote infarct as well as remote circumscribed cystic focus possibly representing cystic encephalomalacia in the left occipital lobe are noted. No new focus of hemorrhage, infarction, midline shift or edema. Midline fourth ventricle basal cisterns without effacement. No hydrocephalus. Vascular: No hyperdense vessel sign.  Skull: Intact Sinuses/Orbits: Nonacute Other: None IMPRESSION: 1. Chronic microvascular ischemia and stigmata of remote right frontoparietal lobe infarct with cystic encephalomalacia also noted in the left occipital lobe. 2. No acute intracranial abnormality. Electronically Signed   By: Ashley Royalty M.D.   On: 09/06/2018 00:44    (Echo, Carotid, EGD, Colonoscopy, ERCP)    Subjective: Patient resting sitter by the bedside overnight sitter did not report anything new other than patient went to sleep very late last night  Discharge Exam: Vitals:   09/15/18 2034 09/16/18 0552  BP: (!) 151/74 (!) 163/97  Pulse: 79 65  Resp: 20 20  Temp: 98.4 F (36.9 C) 97.7 F (36.5 C)  SpO2: 95% 93%   Vitals:   09/15/18 1630 09/15/18 1638 09/15/18 2034 09/16/18 0552  BP: (!) 167/91 (!) 159/94 (!) 151/74 (!) 163/97  Pulse: 74 75 79 65  Resp:  18 20 20   Temp:  (!) 97.2 F (36.2 C) 98.4 F (36.9 C) 97.7 F (36.5 C)  TempSrc:  Oral Oral Oral  SpO2:  98% 95% 93%  Weight:  91.1 kg    Height:        General: Pt is alert, awake, not in acute distress Cardiovascular: RRR, S1/S2 +, no rubs, no gallops Respiratory: CTA bilaterally, no wheezing, no rhonchi Abdominal: Soft, NT, ND, bowel sounds + Extremities: no edema, no cyanosis    The results of significant diagnostics from this hospitalization (including imaging, microbiology, ancillary and laboratory) are listed below for reference.     Microbiology: Recent Results (from the past 240 hour(s))  Gastrointestinal Panel by PCR , Stool     Status: None   Collection Time: 09/06/18  6:47 PM  Result Value Ref Range Status   Campylobacter species NOT DETECTED NOT DETECTED Final   Plesimonas shigelloides NOT DETECTED NOT DETECTED Final   Salmonella species NOT DETECTED NOT DETECTED Final   Yersinia enterocolitica NOT DETECTED NOT DETECTED Final   Vibrio species NOT DETECTED NOT DETECTED Final   Vibrio cholerae NOT DETECTED NOT DETECTED Final    Enteroaggregative E coli (EAEC) NOT DETECTED NOT DETECTED Final   Enteropathogenic E coli (EPEC) NOT DETECTED NOT DETECTED Final   Enterotoxigenic E coli (ETEC) NOT DETECTED NOT DETECTED Final   Shiga like toxin producing E coli (STEC) NOT DETECTED NOT DETECTED Final   Shigella/Enteroinvasive E coli (EIEC) NOT DETECTED NOT DETECTED Final   Cryptosporidium NOT DETECTED NOT DETECTED Final   Cyclospora cayetanensis NOT DETECTED NOT DETECTED Final   Entamoeba histolytica NOT DETECTED NOT DETECTED Final   Giardia lamblia NOT DETECTED NOT DETECTED Final   Adenovirus F40/41 NOT DETECTED NOT DETECTED Final   Astrovirus NOT DETECTED NOT DETECTED Final   Norovirus GI/GII NOT DETECTED NOT DETECTED Final   Rotavirus A NOT DETECTED NOT DETECTED Final   Sapovirus (I, II, IV, and V) NOT DETECTED NOT DETECTED Final  Comment: Performed at [facility], [address] [place], [place] [address]     Labs: BNP (last 3 results) Recent Labs    08/10/18 1720  BNP 6,222.9*   Basic Metabolic Panel: Recent Labs  Lab 09/10/18 0944 09/13/18 1529 09/15/18 1253  NA 137 133* 137  K 4.6 5.0 4.2  CL 101 97* 100  CO2 23 21* 25  GLUCOSE 131* 71 72  BUN 56* 63* 41*  CREATININE 14.33* 13.85* 10.81*  CALCIUM 8.2* 7.7* 8.4*  PHOS 5.5* 5.7* 4.5   Liver Function Tests: Recent Labs  Lab 09/10/18 0944 09/13/18 1529 09/15/18 1253  ALBUMIN 3.1* 3.0* 3.1*   No results for input(s): LIPASE, AMYLASE in the last 168 hours. No results for input(s): AMMONIA in the last 168 hours. CBC: Recent Labs  Lab 09/10/18 0943 09/13/18 1529 09/15/18 1253  WBC 4.0 3.8* 3.9*  HGB 9.7* 9.6* 9.7*  HCT 29.9* 28.6* 29.1*  MCV 95.2 93.8 95.1  PLT 101* 100* 100*   Cardiac Enzymes: No results for input(s): CKTOTAL, CKMB, CKMBINDEX, TROPONINI in the last 168 hours. BNP: Invalid input(s): POCBNP CBG: No results for input(s): GLUCAP in the last 168 hours. D-Dimer No results for input(s): DDIMER in the last 72  hours. Hgb A1c No results for input(s): HGBA1C in the last 72 hours. Lipid Profile No results for input(s): CHOL, HDL, LDLCALC, TRIG, CHOLHDL, LDLDIRECT in the last 72 hours. Thyroid function studies No results for input(s): TSH, T4TOTAL, T3FREE, THYROIDAB in the last 72 hours.  Invalid input(s): FREET3 Anemia work up No results for input(s): VITAMINB12, FOLATE, FERRITIN, TIBC, IRON, RETICCTPCT in the last 72 hours. Urinalysis No results found for: COLORURINE, APPEARANCEUR, LABSPEC, PHURINE, GLUCOSEU, HGBUR, BILIRUBINUR, KETONESUR, PROTEINUR, UROBILINOGEN, NITRITE, LEUKOCYTESUR Sepsis Labs Invalid input(s): PROCALCITONIN,  WBC,  LACTICIDVEN Microbiology Recent Results (from the past 240 hour(s))  Gastrointestinal Panel by PCR , Stool     Status: None   Collection Time: 09/06/18  6:47 PM  Result Value Ref Range Status   Campylobacter species NOT DETECTED NOT DETECTED Final   Plesimonas shigelloides NOT DETECTED NOT DETECTED Final   Salmonella species NOT DETECTED NOT DETECTED Final   Yersinia enterocolitica NOT DETECTED NOT DETECTED Final   Vibrio species NOT DETECTED NOT DETECTED Final   Vibrio cholerae NOT DETECTED NOT DETECTED Final   Enteroaggregative E coli (EAEC) NOT DETECTED NOT DETECTED Final   Enteropathogenic E coli (EPEC) NOT DETECTED NOT DETECTED Final   Enterotoxigenic E coli (ETEC) NOT DETECTED NOT DETECTED Final   Shiga like toxin producing E coli (STEC) NOT DETECTED NOT DETECTED Final   Shigella/Enteroinvasive E coli (EIEC) NOT DETECTED NOT DETECTED Final   Cryptosporidium NOT DETECTED NOT DETECTED Final   Cyclospora cayetanensis NOT DETECTED NOT DETECTED Final   Entamoeba histolytica NOT DETECTED NOT DETECTED Final   Giardia lamblia NOT DETECTED NOT DETECTED Final   Adenovirus F40/41 NOT DETECTED NOT DETECTED Final   Astrovirus NOT DETECTED NOT DETECTED Final   Norovirus GI/GII NOT DETECTED NOT DETECTED Final   Rotavirus A NOT DETECTED NOT DETECTED Final    Sapovirus (I, II, IV, and V) NOT DETECTED NOT DETECTED Final    Comment: Performed at [facility], [address] [place], [place] [address]     Time coordinating discharge: 33 minutes  SIGNED:   Georgette Shell, MD  [facility] 09/16/2018, 9:13 AM Pager   If 7PM-7AM, please contact night-coverage [email or web address] Password [email or web address]

## 2018-09-17 ENCOUNTER — Ambulatory Visit: Payer: Self-pay

## 2018-09-17 ENCOUNTER — Other Ambulatory Visit: Payer: Self-pay | Admitting: Internal Medicine

## 2018-09-17 ENCOUNTER — Telehealth: Payer: Self-pay

## 2018-09-17 NOTE — Chronic Care Management (AMB) (Signed)
  Chronic Care Management   Social Work Note  09/17/2018 Name: George [name] MRN: [id number] DOB: [birth year]  SW noted the patient discharged from most recent inpatient stay within the last 24 hours. SW reached out to the patient to assess community resource needs. Unfortunately, the call was unsuccessful. SW left a HIPAA compliant voice message requesting a return call.  Follow Up Plan: SW will follow up with patient by phone over the next week.  Daneen Schick, BSW, CDP [facility] / [facility] Care Management Social Worker [phone]  Total time spent performing care coordination and/or care management activities with the patient by phone or face to face = 5 minutes.

## 2018-09-17 NOTE — Telephone Encounter (Signed)
Called to make appt for hospital followup

## 2018-09-23 ENCOUNTER — Telehealth: Payer: Self-pay

## 2018-09-23 ENCOUNTER — Ambulatory Visit: Payer: Self-pay

## 2018-09-23 DIAGNOSIS — N186 End stage renal disease: Secondary | ICD-10-CM

## 2018-09-23 DIAGNOSIS — Z992 Dependence on renal dialysis: Principal | ICD-10-CM

## 2018-09-23 DIAGNOSIS — I10 Essential (primary) hypertension: Secondary | ICD-10-CM

## 2018-09-23 DIAGNOSIS — I509 Heart failure, unspecified: Secondary | ICD-10-CM

## 2018-09-23 DIAGNOSIS — E119 Type 2 diabetes mellitus without complications: Secondary | ICD-10-CM

## 2018-09-23 NOTE — Chronic Care Management (AMB) (Signed)
Chronic Care Management   Social Work Note  09/23/2018 Name: George [name] MRN: [id number] DOB: Jan 24, 1971  George [name] is a 48 y.o. year old male who sees [name], [name], [place] for primary care. The CCM team was consulted for assistance with [employer].   I attempted to contact the patient to assist with care plan goals. Today's call was unsuccessful. Multiple call attempts to this patient to engage. HIPAA compliant voice message left requesting a return call.  Goals Addressed            This Visit's Progress     Patient Stated   . "I need food" (pt-stated)   Not on track    Current Barriers:  . Financial constraints . Limited access to food . Limited social support  Clinical Social Work Clinical Goal(s):  Marland Kitchen Over the next 10 days, client will work with SW to address concerns related to food insecurities . Over the next 30 days, client will work with SW to address concerns related to food and nutrition benefits.   Interventions: . Client interviewed and appropriate assessments performed . Advised client to continue accessing the [facility]) for hot meals. . Advised client to submit a new application regarding current wages to the food and nutrition office in efforts of increasing awarded assistance  Patient Self Care Activities:  . Calls provider office for new concerns or questions  Plan:  . Social Worker will assist the patient with updated food and nutrition services of current wage amount. . The Patient will continue accessing the [facility] for daily meals.  *initial goal documentation     . "I need help finding a place to say" (pt-stated)   Not on track    Current Barriers:  . Housing barriers . Limited social support  Clinical Social Work Clinical Goal(s):  Marland Kitchen Over the next 30 days, client will work with SW to address concerns related to transitional housing. . Over the next 60 days, client will work  with SW to address concerns related to permanent housing  Interventions: . Client interviewed and appropriate assessments performed  Patient Self Care Activities:  . Calls provider office for new concerns or questions   Plan:  . Social Worker will contact transitional housing regarding a safe place for patient to stay . Social Worker will assist the patient in locating affordable housing for long-term use . The patient will continue to access the interactive resource center for daily resource assistance  Please see past updates related to this goal by clicking on the "Past Updates" button in the selected goal       . "I need help with managing my dialysis" (pt-stated)   Not on track    Current Barriers:  Marland Kitchen Knowledge Deficits related to ESRD disease process and need for long term self health management plans and plan of care . Homelessness . Transportation Barriers . Financial Barriers  Nurse Case Manager Clinical Goal(s): Over the next 30 days, patient will work with the CCM team to establish a plan of care for long term management of ESRD/Dialysis  Interventions:  . Collaboration with clinic social worker to engage patient re: acute needs related to dialysis   Patient Self Care Activities:  . Patient independently performs ADL's  *initial goal documentation     . "I need transportation for dialysis (pt-stated)   Not on track    Current Barriers:  . Housing barriers . Financial constraints . Limited social support  Clinical Social Work  Clinical Goal(s):  Marland Kitchen Over the next 7 days, client will work with SW to address concerns related to transportation to dialysis . Over the next 30 days, client will work with SW to address concerns related to permanent housing for on going transportation to dialysis   Interventions: . Client interviewed and appropriate assessments performed . Collaborated with RN Case Manager re: barriers to attending upcomming dialysis appointment .  Collaborated with [employer] (community agency) re: patient need for upcomming transportation to [employer] in [place]. Transportation arrangements confirmed.  Patient Self Care Activities:  . Calls provider office for new concerns or questions . Currently unable to independently navigate community resource needs due to an inability to read literature previosuly provided.*  Plan:  . The Patient will be ready for pick-up on Monday 08/16/18 at the designated address by 7:20 am for his 8:30 am appointment. . Social Worker will follow up with the patient within the next two business days to confirm ability to get to dialysis appointment.  *initial goal documentation       Follow Up Plan: SW will un-enroll the patient from the CCM program due to an inability to engage with the patient.   Daneen Schick, BSW, CDP [facility] / [facility] Care Management Social Worker [phone]  Total time spent performing care coordination and/or care management activities with the patient by phone or face to face = 10 minutes.

## 2018-09-24 DIAGNOSIS — Z992 Dependence on renal dialysis: Secondary | ICD-10-CM | POA: Diagnosis not present

## 2018-09-24 DIAGNOSIS — N186 End stage renal disease: Secondary | ICD-10-CM | POA: Diagnosis not present

## 2018-09-25 DIAGNOSIS — N186 End stage renal disease: Secondary | ICD-10-CM | POA: Diagnosis not present

## 2018-09-25 DIAGNOSIS — Z992 Dependence on renal dialysis: Secondary | ICD-10-CM | POA: Diagnosis not present

## 2018-09-28 DIAGNOSIS — N186 End stage renal disease: Secondary | ICD-10-CM | POA: Diagnosis not present

## 2018-09-28 DIAGNOSIS — Z992 Dependence on renal dialysis: Secondary | ICD-10-CM | POA: Diagnosis not present

## 2018-09-29 DIAGNOSIS — N186 End stage renal disease: Secondary | ICD-10-CM | POA: Diagnosis not present

## 2018-09-29 DIAGNOSIS — Z992 Dependence on renal dialysis: Secondary | ICD-10-CM | POA: Diagnosis not present

## 2018-10-01 DIAGNOSIS — Z992 Dependence on renal dialysis: Secondary | ICD-10-CM | POA: Diagnosis not present

## 2018-10-01 DIAGNOSIS — N186 End stage renal disease: Secondary | ICD-10-CM | POA: Diagnosis not present

## 2018-10-04 DIAGNOSIS — N186 End stage renal disease: Secondary | ICD-10-CM | POA: Diagnosis not present

## 2018-10-04 DIAGNOSIS — Z992 Dependence on renal dialysis: Secondary | ICD-10-CM | POA: Diagnosis not present

## 2018-10-04 DIAGNOSIS — I120 Hypertensive chronic kidney disease with stage 5 chronic kidney disease or end stage renal disease: Secondary | ICD-10-CM | POA: Diagnosis not present

## 2018-10-05 ENCOUNTER — Telehealth: Payer: Self-pay | Admitting: Internal Medicine

## 2018-10-05 DIAGNOSIS — R918 Other nonspecific abnormal finding of lung field: Secondary | ICD-10-CM | POA: Diagnosis not present

## 2018-10-05 DIAGNOSIS — E1122 Type 2 diabetes mellitus with diabetic chronic kidney disease: Secondary | ICD-10-CM | POA: Diagnosis not present

## 2018-10-05 DIAGNOSIS — R5383 Other fatigue: Secondary | ICD-10-CM | POA: Diagnosis not present

## 2018-10-05 DIAGNOSIS — I132 Hypertensive heart and chronic kidney disease with heart failure and with stage 5 chronic kidney disease, or end stage renal disease: Secondary | ICD-10-CM | POA: Diagnosis not present

## 2018-10-05 DIAGNOSIS — R0602 Shortness of breath: Secondary | ICD-10-CM | POA: Diagnosis not present

## 2018-10-05 DIAGNOSIS — R0609 Other forms of dyspnea: Secondary | ICD-10-CM | POA: Diagnosis not present

## 2018-10-05 DIAGNOSIS — N186 End stage renal disease: Secondary | ICD-10-CM | POA: Diagnosis not present

## 2018-10-05 DIAGNOSIS — R0601 Orthopnea: Secondary | ICD-10-CM | POA: Diagnosis not present

## 2018-10-05 DIAGNOSIS — R0789 Other chest pain: Secondary | ICD-10-CM | POA: Diagnosis not present

## 2018-10-05 DIAGNOSIS — E669 Obesity, unspecified: Secondary | ICD-10-CM | POA: Diagnosis not present

## 2018-10-05 DIAGNOSIS — I509 Heart failure, unspecified: Secondary | ICD-10-CM | POA: Diagnosis not present

## 2018-10-05 NOTE — Telephone Encounter (Signed)
I called the patient to schedule AWV with Pamala Hurry and OV with [name].  He said that he no longer lives in [place], so he won't be seeing her as his PCP anymore. VDM (DD)

## 2018-10-06 DIAGNOSIS — E1122 Type 2 diabetes mellitus with diabetic chronic kidney disease: Secondary | ICD-10-CM | POA: Diagnosis not present

## 2018-10-06 DIAGNOSIS — I509 Heart failure, unspecified: Secondary | ICD-10-CM | POA: Diagnosis not present

## 2018-10-06 DIAGNOSIS — I499 Cardiac arrhythmia, unspecified: Secondary | ICD-10-CM | POA: Diagnosis not present

## 2018-10-06 DIAGNOSIS — R0601 Orthopnea: Secondary | ICD-10-CM | POA: Diagnosis not present

## 2018-10-06 DIAGNOSIS — E669 Obesity, unspecified: Secondary | ICD-10-CM | POA: Diagnosis not present

## 2018-10-06 DIAGNOSIS — R079 Chest pain, unspecified: Secondary | ICD-10-CM | POA: Diagnosis not present

## 2018-10-06 DIAGNOSIS — I440 Atrioventricular block, first degree: Secondary | ICD-10-CM | POA: Diagnosis not present

## 2018-10-06 DIAGNOSIS — R9431 Abnormal electrocardiogram [ECG] [EKG]: Secondary | ICD-10-CM | POA: Diagnosis not present

## 2018-10-06 DIAGNOSIS — R0609 Other forms of dyspnea: Secondary | ICD-10-CM | POA: Diagnosis not present

## 2018-10-06 DIAGNOSIS — I132 Hypertensive heart and chronic kidney disease with heart failure and with stage 5 chronic kidney disease, or end stage renal disease: Secondary | ICD-10-CM | POA: Diagnosis not present

## 2018-10-06 DIAGNOSIS — N186 End stage renal disease: Secondary | ICD-10-CM | POA: Diagnosis not present

## 2018-10-06 DIAGNOSIS — R0789 Other chest pain: Secondary | ICD-10-CM | POA: Diagnosis not present

## 2018-10-07 DIAGNOSIS — E669 Obesity, unspecified: Secondary | ICD-10-CM | POA: Diagnosis not present

## 2018-10-07 DIAGNOSIS — I440 Atrioventricular block, first degree: Secondary | ICD-10-CM | POA: Diagnosis not present

## 2018-10-07 DIAGNOSIS — I132 Hypertensive heart and chronic kidney disease with heart failure and with stage 5 chronic kidney disease, or end stage renal disease: Secondary | ICD-10-CM | POA: Diagnosis not present

## 2018-10-07 DIAGNOSIS — R9431 Abnormal electrocardiogram [ECG] [EKG]: Secondary | ICD-10-CM | POA: Diagnosis not present

## 2018-10-07 DIAGNOSIS — Z9115 Patient's noncompliance with renal dialysis: Secondary | ICD-10-CM | POA: Diagnosis not present

## 2018-10-07 DIAGNOSIS — E1122 Type 2 diabetes mellitus with diabetic chronic kidney disease: Secondary | ICD-10-CM | POA: Diagnosis not present

## 2018-10-07 DIAGNOSIS — R0609 Other forms of dyspnea: Secondary | ICD-10-CM | POA: Diagnosis not present

## 2018-10-07 DIAGNOSIS — I499 Cardiac arrhythmia, unspecified: Secondary | ICD-10-CM | POA: Diagnosis not present

## 2018-10-07 DIAGNOSIS — I509 Heart failure, unspecified: Secondary | ICD-10-CM | POA: Diagnosis not present

## 2018-10-07 DIAGNOSIS — N186 End stage renal disease: Secondary | ICD-10-CM | POA: Diagnosis not present

## 2018-10-07 DIAGNOSIS — R0601 Orthopnea: Secondary | ICD-10-CM | POA: Diagnosis not present

## 2018-10-07 DIAGNOSIS — R0789 Other chest pain: Secondary | ICD-10-CM | POA: Diagnosis not present

## 2018-10-08 DIAGNOSIS — I509 Heart failure, unspecified: Secondary | ICD-10-CM | POA: Diagnosis not present

## 2018-10-08 DIAGNOSIS — N186 End stage renal disease: Secondary | ICD-10-CM | POA: Diagnosis not present

## 2018-10-08 DIAGNOSIS — E1122 Type 2 diabetes mellitus with diabetic chronic kidney disease: Secondary | ICD-10-CM | POA: Diagnosis not present

## 2018-10-08 DIAGNOSIS — I272 Pulmonary hypertension, unspecified: Secondary | ICD-10-CM | POA: Diagnosis not present

## 2018-10-08 DIAGNOSIS — G40909 Epilepsy, unspecified, not intractable, without status epilepticus: Secondary | ICD-10-CM | POA: Diagnosis not present

## 2018-10-08 DIAGNOSIS — R0602 Shortness of breath: Secondary | ICD-10-CM | POA: Diagnosis not present

## 2018-10-08 DIAGNOSIS — I5032 Chronic diastolic (congestive) heart failure: Secondary | ICD-10-CM | POA: Diagnosis not present

## 2018-10-08 DIAGNOSIS — T465X6A Underdosing of other antihypertensive drugs, initial encounter: Secondary | ICD-10-CM | POA: Diagnosis not present

## 2018-10-08 DIAGNOSIS — J90 Pleural effusion, not elsewhere classified: Secondary | ICD-10-CM | POA: Diagnosis not present

## 2018-10-08 DIAGNOSIS — R079 Chest pain, unspecified: Secondary | ICD-10-CM | POA: Diagnosis not present

## 2018-10-08 DIAGNOSIS — E872 Acidosis: Secondary | ICD-10-CM | POA: Diagnosis not present

## 2018-10-08 DIAGNOSIS — I132 Hypertensive heart and chronic kidney disease with heart failure and with stage 5 chronic kidney disease, or end stage renal disease: Secondary | ICD-10-CM | POA: Diagnosis not present

## 2018-10-08 DIAGNOSIS — I161 Hypertensive emergency: Secondary | ICD-10-CM | POA: Diagnosis not present

## 2018-10-09 DIAGNOSIS — T465X6A Underdosing of other antihypertensive drugs, initial encounter: Secondary | ICD-10-CM | POA: Diagnosis not present

## 2018-10-09 DIAGNOSIS — I5032 Chronic diastolic (congestive) heart failure: Secondary | ICD-10-CM | POA: Diagnosis not present

## 2018-10-09 DIAGNOSIS — I509 Heart failure, unspecified: Secondary | ICD-10-CM | POA: Diagnosis not present

## 2018-10-09 DIAGNOSIS — E872 Acidosis: Secondary | ICD-10-CM | POA: Diagnosis not present

## 2018-10-09 DIAGNOSIS — E1122 Type 2 diabetes mellitus with diabetic chronic kidney disease: Secondary | ICD-10-CM | POA: Diagnosis not present

## 2018-10-09 DIAGNOSIS — R079 Chest pain, unspecified: Secondary | ICD-10-CM | POA: Diagnosis not present

## 2018-10-09 DIAGNOSIS — G40909 Epilepsy, unspecified, not intractable, without status epilepticus: Secondary | ICD-10-CM | POA: Diagnosis not present

## 2018-10-09 DIAGNOSIS — I272 Pulmonary hypertension, unspecified: Secondary | ICD-10-CM | POA: Diagnosis not present

## 2018-10-09 DIAGNOSIS — I132 Hypertensive heart and chronic kidney disease with heart failure and with stage 5 chronic kidney disease, or end stage renal disease: Secondary | ICD-10-CM | POA: Diagnosis not present

## 2018-10-09 DIAGNOSIS — N186 End stage renal disease: Secondary | ICD-10-CM | POA: Diagnosis not present

## 2018-10-09 DIAGNOSIS — I161 Hypertensive emergency: Secondary | ICD-10-CM | POA: Diagnosis not present

## 2018-10-13 DIAGNOSIS — N186 End stage renal disease: Secondary | ICD-10-CM | POA: Diagnosis not present

## 2018-10-13 DIAGNOSIS — Z992 Dependence on renal dialysis: Secondary | ICD-10-CM | POA: Diagnosis not present

## 2018-10-13 DIAGNOSIS — I259 Chronic ischemic heart disease, unspecified: Secondary | ICD-10-CM | POA: Diagnosis not present

## 2018-10-15 DIAGNOSIS — N186 End stage renal disease: Secondary | ICD-10-CM | POA: Diagnosis not present

## 2018-10-15 DIAGNOSIS — Z992 Dependence on renal dialysis: Secondary | ICD-10-CM | POA: Diagnosis not present

## 2018-10-18 DIAGNOSIS — Z992 Dependence on renal dialysis: Secondary | ICD-10-CM | POA: Diagnosis not present

## 2018-10-18 DIAGNOSIS — E119 Type 2 diabetes mellitus without complications: Secondary | ICD-10-CM | POA: Diagnosis not present

## 2018-10-18 DIAGNOSIS — N186 End stage renal disease: Secondary | ICD-10-CM | POA: Diagnosis not present

## 2018-10-20 DIAGNOSIS — N2581 Secondary hyperparathyroidism of renal origin: Secondary | ICD-10-CM | POA: Diagnosis not present

## 2018-10-20 DIAGNOSIS — D509 Iron deficiency anemia, unspecified: Secondary | ICD-10-CM | POA: Diagnosis not present

## 2018-10-20 DIAGNOSIS — Z992 Dependence on renal dialysis: Secondary | ICD-10-CM | POA: Diagnosis not present

## 2018-10-20 DIAGNOSIS — N186 End stage renal disease: Secondary | ICD-10-CM | POA: Diagnosis not present

## 2018-10-21 DIAGNOSIS — Z79891 Long term (current) use of opiate analgesic: Secondary | ICD-10-CM | POA: Diagnosis not present

## 2018-10-22 DIAGNOSIS — Z992 Dependence on renal dialysis: Secondary | ICD-10-CM | POA: Diagnosis not present

## 2018-10-22 DIAGNOSIS — N186 End stage renal disease: Secondary | ICD-10-CM | POA: Diagnosis not present

## 2018-10-25 DIAGNOSIS — N186 End stage renal disease: Secondary | ICD-10-CM | POA: Diagnosis not present

## 2018-10-25 DIAGNOSIS — Z992 Dependence on renal dialysis: Secondary | ICD-10-CM | POA: Diagnosis not present

## 2018-10-27 DIAGNOSIS — N2581 Secondary hyperparathyroidism of renal origin: Secondary | ICD-10-CM | POA: Diagnosis not present

## 2018-10-27 DIAGNOSIS — N186 End stage renal disease: Secondary | ICD-10-CM | POA: Diagnosis not present

## 2018-10-27 DIAGNOSIS — Z992 Dependence on renal dialysis: Secondary | ICD-10-CM | POA: Diagnosis not present

## 2018-10-28 DIAGNOSIS — Z992 Dependence on renal dialysis: Secondary | ICD-10-CM | POA: Diagnosis not present

## 2018-10-28 DIAGNOSIS — N186 End stage renal disease: Secondary | ICD-10-CM | POA: Diagnosis not present

## 2018-10-29 DIAGNOSIS — N186 End stage renal disease: Secondary | ICD-10-CM | POA: Diagnosis not present

## 2018-10-29 DIAGNOSIS — I120 Hypertensive chronic kidney disease with stage 5 chronic kidney disease or end stage renal disease: Secondary | ICD-10-CM | POA: Diagnosis not present

## 2018-10-29 DIAGNOSIS — Z992 Dependence on renal dialysis: Secondary | ICD-10-CM | POA: Diagnosis not present

## 2018-11-01 DIAGNOSIS — Z992 Dependence on renal dialysis: Secondary | ICD-10-CM | POA: Diagnosis not present

## 2018-11-01 DIAGNOSIS — N186 End stage renal disease: Secondary | ICD-10-CM | POA: Diagnosis not present

## 2018-11-04 DIAGNOSIS — N186 End stage renal disease: Secondary | ICD-10-CM | POA: Diagnosis not present

## 2018-11-04 DIAGNOSIS — Z992 Dependence on renal dialysis: Secondary | ICD-10-CM | POA: Diagnosis not present

## 2018-11-05 DIAGNOSIS — N2581 Secondary hyperparathyroidism of renal origin: Secondary | ICD-10-CM | POA: Diagnosis not present

## 2018-11-05 DIAGNOSIS — N186 End stage renal disease: Secondary | ICD-10-CM | POA: Diagnosis not present

## 2018-11-05 DIAGNOSIS — Z992 Dependence on renal dialysis: Secondary | ICD-10-CM | POA: Diagnosis not present

## 2018-11-08 DIAGNOSIS — N186 End stage renal disease: Secondary | ICD-10-CM | POA: Diagnosis not present

## 2018-11-08 DIAGNOSIS — Z992 Dependence on renal dialysis: Secondary | ICD-10-CM | POA: Diagnosis not present

## 2018-11-10 DIAGNOSIS — R69 Illness, unspecified: Secondary | ICD-10-CM | POA: Diagnosis not present

## 2018-11-10 DIAGNOSIS — R464 Slowness and poor responsiveness: Secondary | ICD-10-CM | POA: Diagnosis not present

## 2018-11-10 DIAGNOSIS — Z1159 Encounter for screening for other viral diseases: Secondary | ICD-10-CM | POA: Diagnosis not present

## 2018-11-10 DIAGNOSIS — I509 Heart failure, unspecified: Secondary | ICD-10-CM | POA: Diagnosis not present

## 2018-11-10 DIAGNOSIS — I132 Hypertensive heart and chronic kidney disease with heart failure and with stage 5 chronic kidney disease, or end stage renal disease: Secondary | ICD-10-CM | POA: Diagnosis not present

## 2018-11-10 DIAGNOSIS — I469 Cardiac arrest, cause unspecified: Secondary | ICD-10-CM | POA: Diagnosis not present

## 2018-11-10 DIAGNOSIS — Z8669 Personal history of other diseases of the nervous system and sense organs: Secondary | ICD-10-CM | POA: Diagnosis not present

## 2018-11-10 DIAGNOSIS — E119 Type 2 diabetes mellitus without complications: Secondary | ICD-10-CM | POA: Diagnosis not present

## 2018-11-10 DIAGNOSIS — E785 Hyperlipidemia, unspecified: Secondary | ICD-10-CM | POA: Diagnosis not present

## 2018-11-29 DEATH — deceased

## 2020-11-13 IMAGING — DX DG ORTHOPANTOGRAM /PANORAMIC
1 series · 1 of 1 positions shown · non-contrast
Comparison: None.

CLINICAL DATA: Left-sided jaw pain

EXAM:
ORTHOPANTOGRAM/PANORAMIC

[view not recorded]
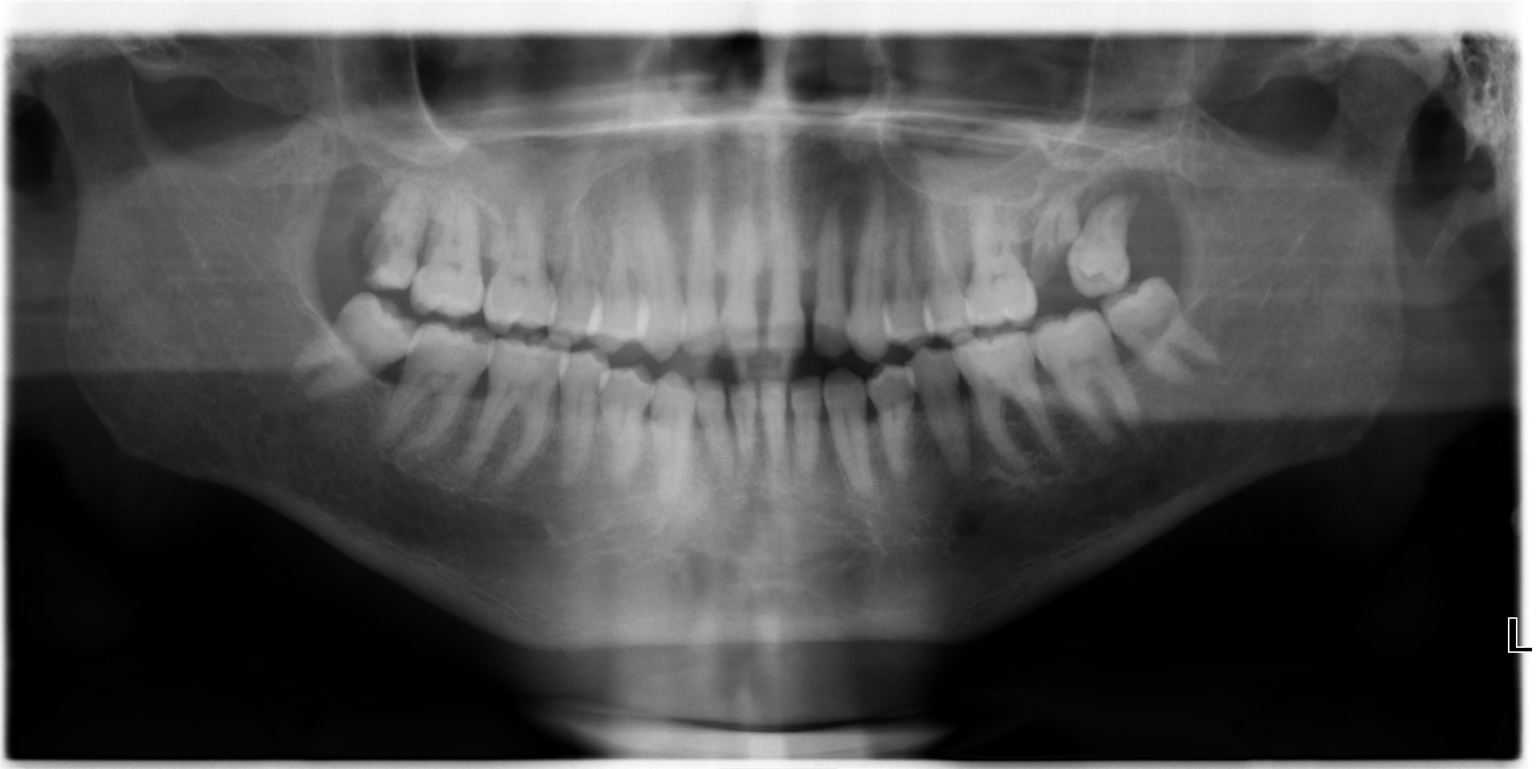

[1 of 1 positions shown; findings below may reference images not displayed]

FINDINGS: Panoramic view of the mandible demonstrates no acute fracture.
Dental caries are noted involving the left second maxillary molar as
well as the right third maxillary molar. No periapical abscess is
noted. No other focal abnormality is seen.
IMPRESSION: Dental caries as described.  No acute abnormality is noted.

## 2020-11-23 IMAGING — CT CT HEAD W/O CM
3 of 4 series · 14 of 47 positions shown, 16 images · non-contrast
Comparison: 06/20/2015 head CT and MRI

CLINICAL DATA: Dyspnea for 1 hour.  Headache.

EXAM:
CT HEAD WITHOUT CONTRAST
TECHNIQUE: Contiguous axial images were obtained from the base of the skull
through the vertex without intravenous contrast.

[Series 4: head 2.0 h70h · axial · 0.46mm/px · z∈[-113,+9]mm · 8 of 77 slices shown, 10 images]
[im 8/77  brain]
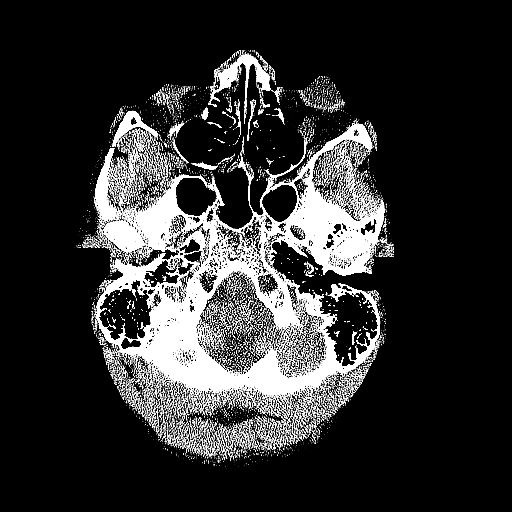
[im 8/77  bone]
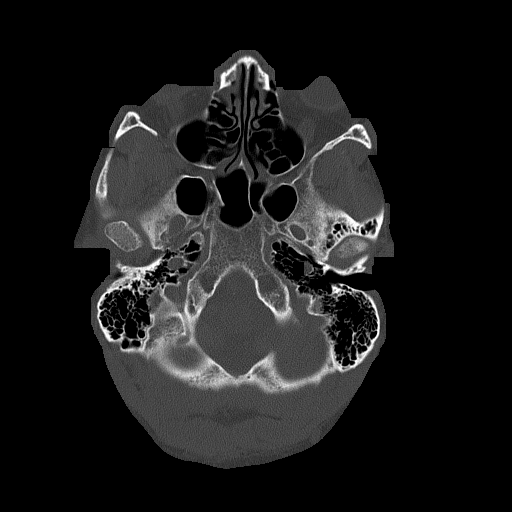
[im 16/77  brain]
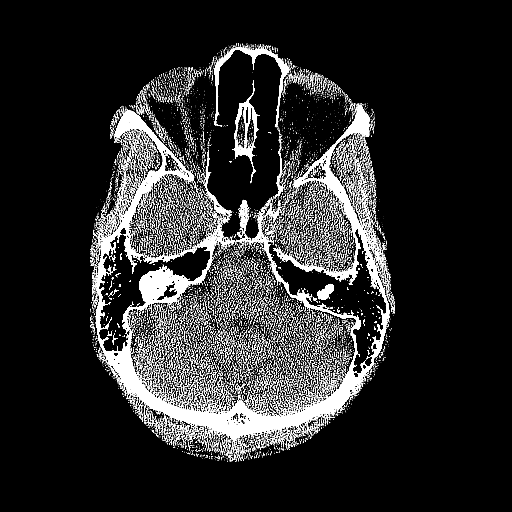
[im 23/77  brain]
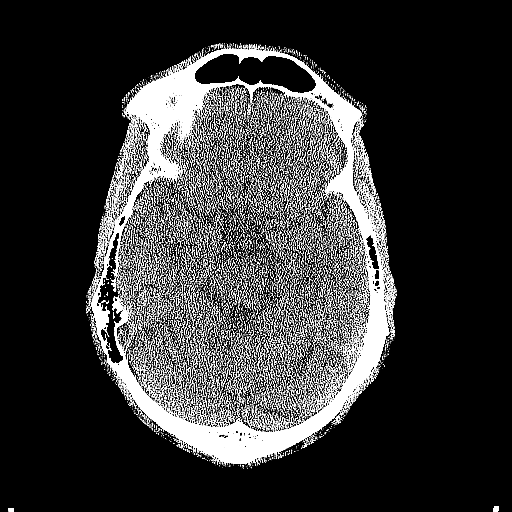
[im 35/77  brain]
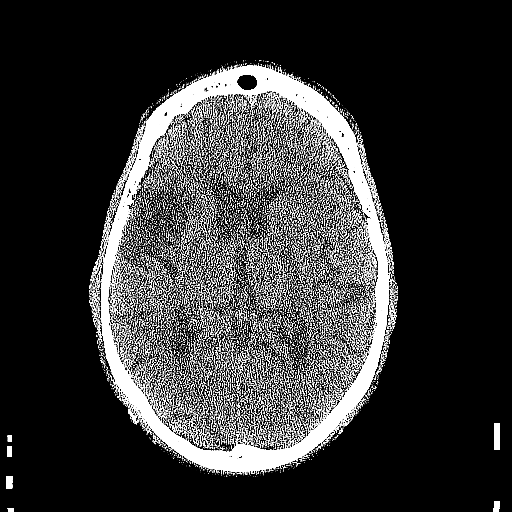
[im 42/77  brain]
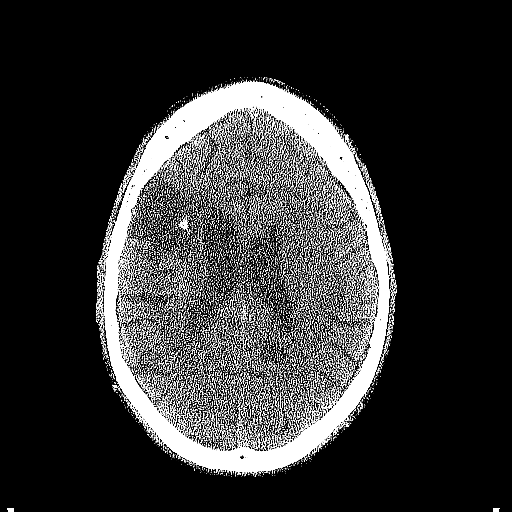
[im 42/77  bone]
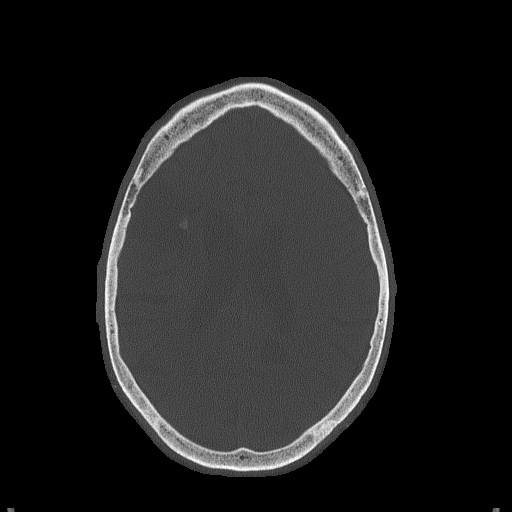
[im 54/77  brain]
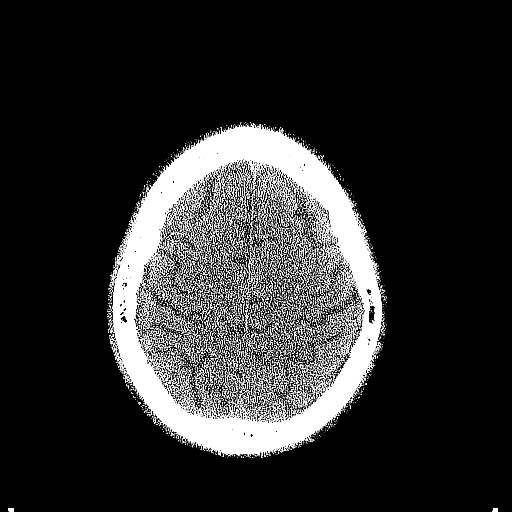
[im 61/77  brain]
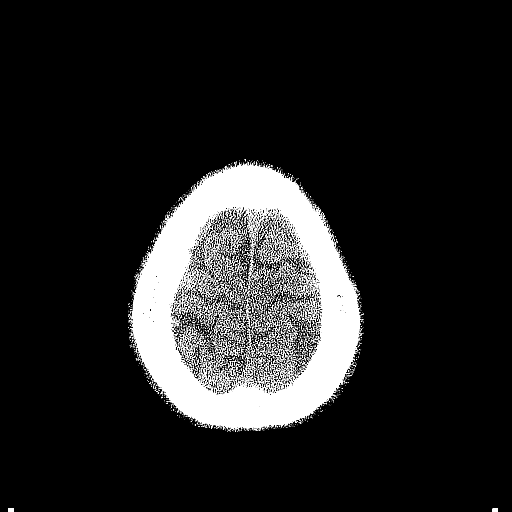
[im 69/77  brain]
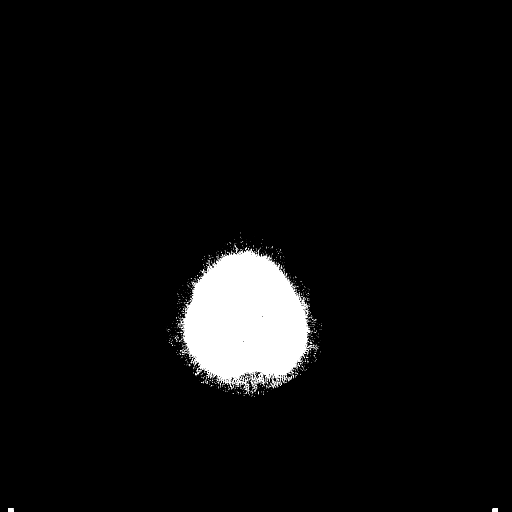

[Series 5: head 3.0 mpr cor · coronal · 0.30mm/px · 3 of 67 slices shown]
[im 23/67  brain]
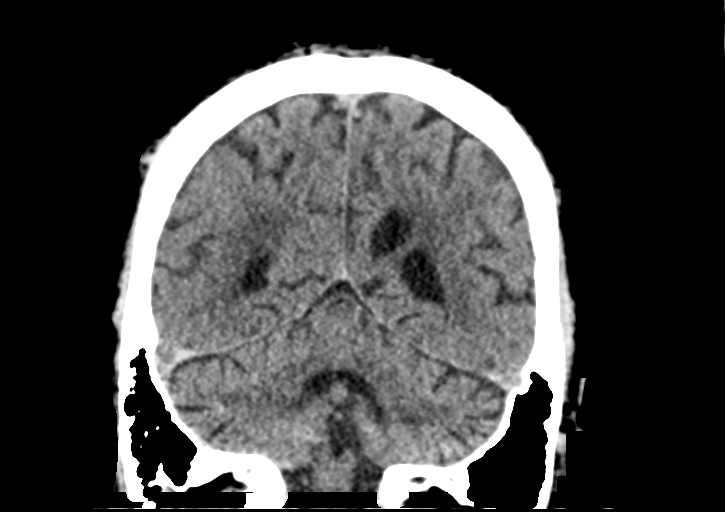
[im 30/67  brain]
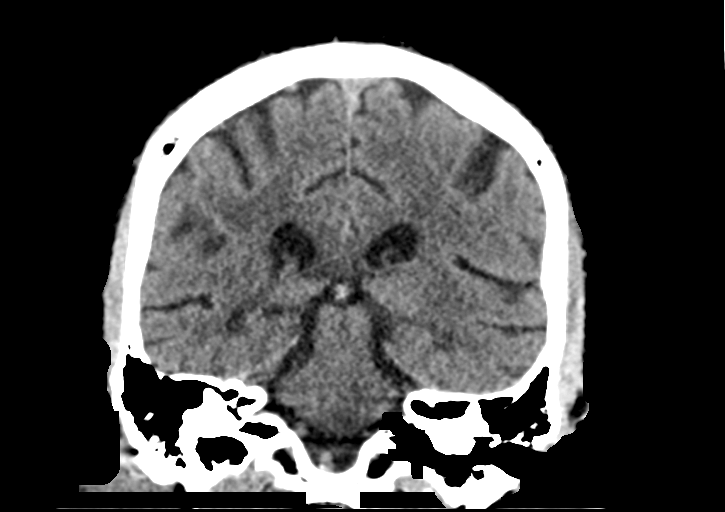
[im 37/67  brain]
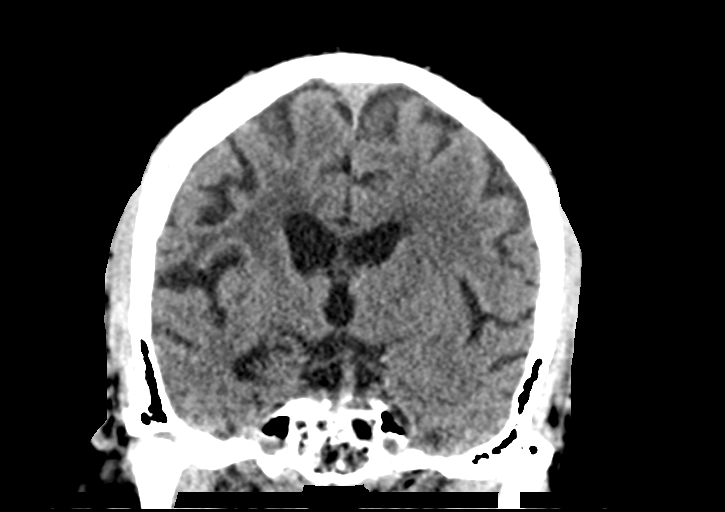

[Series 6: head 3.0 mpr sag · sagittal · 0.30mm/px · 3 of 67 slices shown]
[im 23/67  brain]
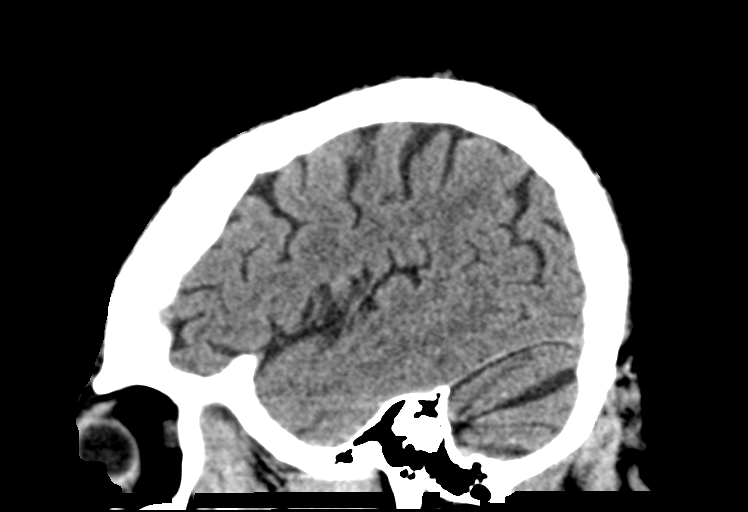
[im 34/67  brain]
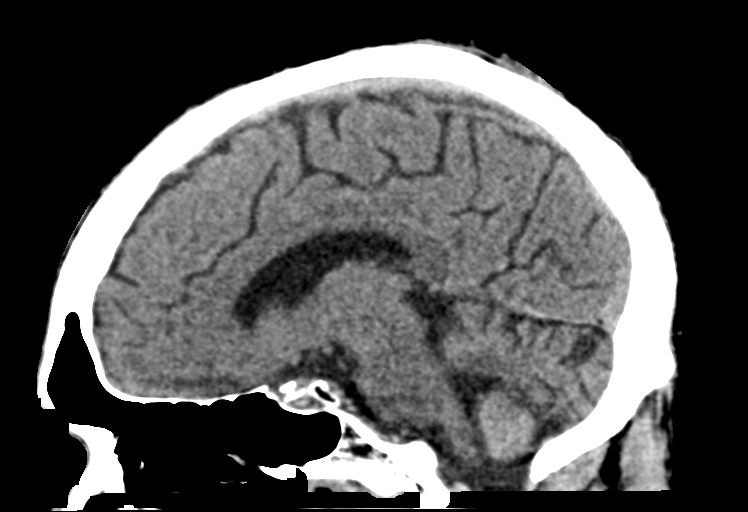
[im 45/67  brain]
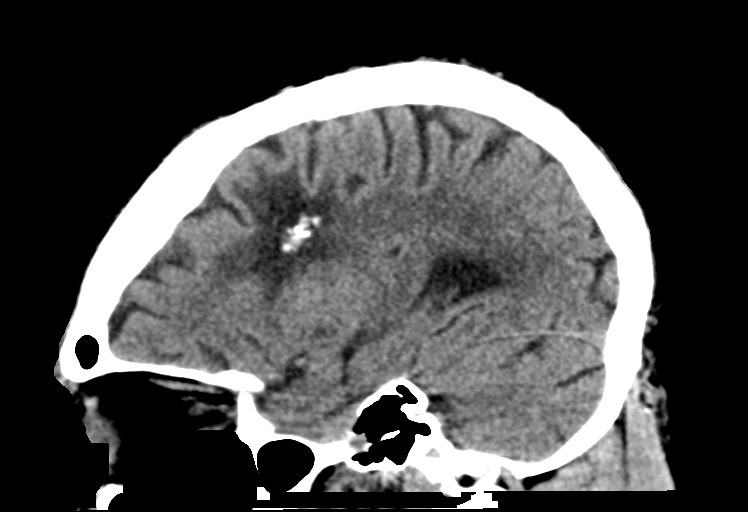

[14 of 47 positions shown; findings below may reference images not displayed]

FINDINGS: Brain: Encephalomalacia involving the right frontoparietal lobe from
remote infarct as well as remote circumscribed cystic focus possibly
representing cystic encephalomalacia in the left occipital lobe are
noted. No new focus of hemorrhage, infarction, midline shift or
edema. Midline fourth ventricle basal cisterns without effacement.
No hydrocephalus.

Vascular: No hyperdense vessel sign.

Skull: Intact

Sinuses/Orbits: Nonacute

Other: None
IMPRESSION: 1. Chronic microvascular ischemia and stigmata of remote right
frontoparietal lobe infarct with cystic encephalomalacia also noted
in the left occipital lobe.
2. No acute intracranial abnormality.
